# Patient Record
Sex: Female | Born: 1945 | Race: White | Hispanic: No | Marital: Married | Smoking: Never smoker
Health system: Southern US, Community
[De-identification: ages and names within clinical notes are randomized; demographics above are authoritative.]

## PROBLEM LIST (undated history)

## (undated) DIAGNOSIS — R011 Cardiac murmur, unspecified: Secondary | ICD-10-CM

## (undated) DIAGNOSIS — M1990 Unspecified osteoarthritis, unspecified site: Secondary | ICD-10-CM

## (undated) DIAGNOSIS — D649 Anemia, unspecified: Secondary | ICD-10-CM

## (undated) DIAGNOSIS — K219 Gastro-esophageal reflux disease without esophagitis: Secondary | ICD-10-CM

## (undated) DIAGNOSIS — H269 Unspecified cataract: Secondary | ICD-10-CM

## (undated) DIAGNOSIS — K5790 Diverticulosis of intestine, part unspecified, without perforation or abscess without bleeding: Secondary | ICD-10-CM

## (undated) DIAGNOSIS — T7840XA Allergy, unspecified, initial encounter: Secondary | ICD-10-CM

## (undated) DIAGNOSIS — I10 Essential (primary) hypertension: Secondary | ICD-10-CM

## (undated) DIAGNOSIS — E222 Syndrome of inappropriate secretion of antidiuretic hormone: Secondary | ICD-10-CM

## (undated) DIAGNOSIS — I499 Cardiac arrhythmia, unspecified: Secondary | ICD-10-CM

## (undated) DIAGNOSIS — N979 Female infertility, unspecified: Secondary | ICD-10-CM

## (undated) DIAGNOSIS — R7611 Nonspecific reaction to tuberculin skin test without active tuberculosis: Secondary | ICD-10-CM

## (undated) DIAGNOSIS — G629 Polyneuropathy, unspecified: Secondary | ICD-10-CM

## (undated) DIAGNOSIS — F419 Anxiety disorder, unspecified: Secondary | ICD-10-CM

## (undated) DIAGNOSIS — C801 Malignant (primary) neoplasm, unspecified: Secondary | ICD-10-CM

## (undated) DIAGNOSIS — F329 Major depressive disorder, single episode, unspecified: Secondary | ICD-10-CM

## (undated) DIAGNOSIS — F32A Depression, unspecified: Secondary | ICD-10-CM

## (undated) DIAGNOSIS — G709 Myoneural disorder, unspecified: Secondary | ICD-10-CM

## (undated) DIAGNOSIS — K5792 Diverticulitis of intestine, part unspecified, without perforation or abscess without bleeding: Secondary | ICD-10-CM

## (undated) HISTORY — DX: Polyneuropathy, unspecified: G62.9

## (undated) HISTORY — DX: Nonspecific reaction to tuberculin skin test without active tuberculosis: R76.11

## (undated) HISTORY — DX: Cardiac arrhythmia, unspecified: I49.9

## (undated) HISTORY — DX: Syndrome of inappropriate secretion of antidiuretic hormone: E22.2

## (undated) HISTORY — DX: Anemia, unspecified: D64.9

## (undated) HISTORY — PX: LUMBAR DISC SURGERY: SHX700

## (undated) HISTORY — PX: LOOP RECORDER IMPLANT: SHX5954

## (undated) HISTORY — DX: Diverticulosis of intestine, part unspecified, without perforation or abscess without bleeding: K57.90

## (undated) HISTORY — PX: COLONOSCOPY: SHX174

## (undated) HISTORY — PX: UPPER GASTROINTESTINAL ENDOSCOPY: SHX188

## (undated) HISTORY — DX: Myoneural disorder, unspecified: G70.9

## (undated) HISTORY — DX: Gastro-esophageal reflux disease without esophagitis: K21.9

## (undated) HISTORY — PX: CERVICAL BIOPSY  W/ LOOP ELECTRODE EXCISION: SUR135

## (undated) HISTORY — DX: Cardiac murmur, unspecified: R01.1

## (undated) HISTORY — PX: BREAST BIOPSY: SHX20

## (undated) HISTORY — DX: Allergy, unspecified, initial encounter: T78.40XA

## (undated) HISTORY — DX: Major depressive disorder, single episode, unspecified: F32.9

## (undated) HISTORY — PX: HYSTEROSCOPY: SHX211

## (undated) HISTORY — DX: Essential (primary) hypertension: I10

## (undated) HISTORY — PX: DILATION AND CURETTAGE OF UTERUS: SHX78

## (undated) HISTORY — DX: Diverticulitis of intestine, part unspecified, without perforation or abscess without bleeding: K57.92

## (undated) HISTORY — DX: Female infertility, unspecified: N97.9

## (undated) HISTORY — DX: Anxiety disorder, unspecified: F41.9

## (undated) HISTORY — PX: TONSILLECTOMY: SUR1361

## (undated) HISTORY — PX: COLPOSCOPY: SHX161

## (undated) HISTORY — PX: BREAST EXCISIONAL BIOPSY: SUR124

## (undated) HISTORY — DX: Unspecified osteoarthritis, unspecified site: M19.90

## (undated) HISTORY — DX: Depression, unspecified: F32.A

## (undated) HISTORY — PX: LUMBAR LAMINECTOMY: SHX95

---

## 2013-10-03 DIAGNOSIS — I10 Essential (primary) hypertension: Secondary | ICD-10-CM | POA: Diagnosis not present

## 2013-11-01 DIAGNOSIS — I491 Atrial premature depolarization: Secondary | ICD-10-CM | POA: Diagnosis not present

## 2013-11-01 DIAGNOSIS — R002 Palpitations: Secondary | ICD-10-CM | POA: Diagnosis not present

## 2013-11-01 DIAGNOSIS — I10 Essential (primary) hypertension: Secondary | ICD-10-CM | POA: Diagnosis not present

## 2013-11-29 DIAGNOSIS — I10 Essential (primary) hypertension: Secondary | ICD-10-CM | POA: Diagnosis not present

## 2013-12-26 DIAGNOSIS — M542 Cervicalgia: Secondary | ICD-10-CM | POA: Diagnosis not present

## 2013-12-26 DIAGNOSIS — I10 Essential (primary) hypertension: Secondary | ICD-10-CM | POA: Diagnosis not present

## 2013-12-28 DIAGNOSIS — F4322 Adjustment disorder with anxiety: Secondary | ICD-10-CM | POA: Diagnosis not present

## 2014-01-02 DIAGNOSIS — F4322 Adjustment disorder with anxiety: Secondary | ICD-10-CM | POA: Diagnosis not present

## 2014-01-09 DIAGNOSIS — F4322 Adjustment disorder with anxiety: Secondary | ICD-10-CM | POA: Diagnosis not present

## 2014-02-06 DIAGNOSIS — F4322 Adjustment disorder with anxiety: Secondary | ICD-10-CM | POA: Diagnosis not present

## 2014-02-09 DIAGNOSIS — M549 Dorsalgia, unspecified: Secondary | ICD-10-CM | POA: Diagnosis not present

## 2014-02-09 DIAGNOSIS — E785 Hyperlipidemia, unspecified: Secondary | ICD-10-CM | POA: Diagnosis not present

## 2014-02-23 DIAGNOSIS — F4322 Adjustment disorder with anxiety: Secondary | ICD-10-CM | POA: Diagnosis not present

## 2014-03-07 DIAGNOSIS — H25099 Other age-related incipient cataract, unspecified eye: Secondary | ICD-10-CM | POA: Diagnosis not present

## 2014-03-07 DIAGNOSIS — H40019 Open angle with borderline findings, low risk, unspecified eye: Secondary | ICD-10-CM | POA: Diagnosis not present

## 2014-03-07 DIAGNOSIS — H43819 Vitreous degeneration, unspecified eye: Secondary | ICD-10-CM | POA: Diagnosis not present

## 2014-03-09 DIAGNOSIS — E871 Hypo-osmolality and hyponatremia: Secondary | ICD-10-CM | POA: Diagnosis not present

## 2014-03-09 DIAGNOSIS — E785 Hyperlipidemia, unspecified: Secondary | ICD-10-CM | POA: Diagnosis not present

## 2014-03-09 DIAGNOSIS — I10 Essential (primary) hypertension: Secondary | ICD-10-CM | POA: Diagnosis not present

## 2014-04-24 DIAGNOSIS — I10 Essential (primary) hypertension: Secondary | ICD-10-CM | POA: Diagnosis not present

## 2014-07-11 DIAGNOSIS — I10 Essential (primary) hypertension: Secondary | ICD-10-CM | POA: Diagnosis not present

## 2014-07-14 DIAGNOSIS — E785 Hyperlipidemia, unspecified: Secondary | ICD-10-CM | POA: Diagnosis not present

## 2014-07-14 DIAGNOSIS — I10 Essential (primary) hypertension: Secondary | ICD-10-CM | POA: Diagnosis not present

## 2014-07-14 DIAGNOSIS — M810 Age-related osteoporosis without current pathological fracture: Secondary | ICD-10-CM | POA: Diagnosis not present

## 2014-10-04 DIAGNOSIS — F329 Major depressive disorder, single episode, unspecified: Secondary | ICD-10-CM | POA: Diagnosis not present

## 2014-10-04 DIAGNOSIS — E222 Syndrome of inappropriate secretion of antidiuretic hormone: Secondary | ICD-10-CM | POA: Diagnosis not present

## 2014-10-04 DIAGNOSIS — Z23 Encounter for immunization: Secondary | ICD-10-CM | POA: Diagnosis not present

## 2014-11-01 DIAGNOSIS — E222 Syndrome of inappropriate secretion of antidiuretic hormone: Secondary | ICD-10-CM | POA: Diagnosis not present

## 2014-12-04 ENCOUNTER — Ambulatory Visit: Payer: Medicare Other | Admitting: Podiatry

## 2014-12-18 ENCOUNTER — Ambulatory Visit (INDEPENDENT_AMBULATORY_CARE_PROVIDER_SITE_OTHER): Payer: Medicare Other

## 2014-12-18 ENCOUNTER — Ambulatory Visit (INDEPENDENT_AMBULATORY_CARE_PROVIDER_SITE_OTHER): Payer: Medicare Other | Admitting: Podiatry

## 2014-12-18 ENCOUNTER — Encounter: Payer: Self-pay | Admitting: Podiatry

## 2014-12-18 VITALS — BP 134/78 | HR 72 | Resp 12

## 2014-12-18 DIAGNOSIS — R52 Pain, unspecified: Secondary | ICD-10-CM

## 2014-12-18 DIAGNOSIS — S92301A Fracture of unspecified metatarsal bone(s), right foot, initial encounter for closed fracture: Secondary | ICD-10-CM | POA: Diagnosis not present

## 2014-12-18 NOTE — Progress Notes (Signed)
   Subjective:    Patient ID: Katrina Benitez, female    DOB: 15-May-1946, 69 y.o.   MRN: [id number]  HPI N-SORE L-RT FOOT TOP OF THE FOOT D-4 MONTHS O-SLOWLY C-BETTER A-PRESSURE, SHOES T-HEAT, ELEVATION.  Patient as a history of dropping a laptop computer in the right foot approximately December 2015. She still is complaining of some diffuse pain on the dorsal right midfoot area that has persisted since the drop on injury. In addition she has reduced her walking program because of persistent right foot pain  ALSO, B/L BALL OF THE FOOT HAVE CALLUS.  Review of Systems  HENT: Positive for sinus pressure.   Gastrointestinal: Positive for abdominal pain and constipation.  Musculoskeletal: Positive for back pain.  Neurological: Positive for numbness.  All other systems reviewed and are negative.  Patient is a retired Stage manager    Objective:   Physical Exam  Orientated 3  Vascular: DP pulses 2/4 bilaterally PT pulses 2/4 bilaterally Capillary reflex immediate bilaterally  Neurological: Ankle reflex equal and reactive bilaterally Vibratory sensation intact bilaterally  Sensation to 10 g monofilament wire intact 4/5 right and 5/5 left  Dermatological: Texture and turgor. Within normal limits No evidence of callus formation noted bilaterally  Musculoskeletal: There is no restriction ankle, subtalar, midtarsal joints bilaterally Palpable tenderness dorsal midfoot right without any obvious palpable lesions  X-ray examination weightbearing right foot  Metatarsus adductus Bone density appears to be adequate Oblique fracture distal fifth metatarsal without bone callus. There is minimal displacement  Radiographic impression: Fractured fifth metatarsal, delayed union right foot     Assessment & Plan:   Assessment: Fracture fifth right metatarsal, delayed union  Plan: I discussed findings of patient's examination an x-ray today. I made patient aware that she does have a  fracture without any evidence of bony union at this time.  I placed patient in a surgical shoe with instructions to wear at all times with the exception of sleeping and showering Advised patient to DC walking for exercise and substitute exercise bike or rowing machine  Reevaluate and re-x-ray 6 weeks

## 2014-12-18 NOTE — Patient Instructions (Signed)
Wear surgical shoe on a continuousexcept when showering and sleeping Limit standing and walking Okay to do exercise bike or rowing machine

## 2014-12-26 DIAGNOSIS — L309 Dermatitis, unspecified: Secondary | ICD-10-CM | POA: Diagnosis not present

## 2014-12-26 DIAGNOSIS — D239 Other benign neoplasm of skin, unspecified: Secondary | ICD-10-CM | POA: Diagnosis not present

## 2015-01-22 ENCOUNTER — Ambulatory Visit: Payer: Medicare Other | Admitting: Podiatry

## 2015-01-31 DIAGNOSIS — R102 Pelvic and perineal pain: Secondary | ICD-10-CM | POA: Diagnosis not present

## 2015-01-31 DIAGNOSIS — R938 Abnormal findings on diagnostic imaging of other specified body structures: Secondary | ICD-10-CM | POA: Diagnosis not present

## 2015-03-06 DIAGNOSIS — H43812 Vitreous degeneration, left eye: Secondary | ICD-10-CM | POA: Diagnosis not present

## 2015-03-12 DIAGNOSIS — Z1231 Encounter for screening mammogram for malignant neoplasm of breast: Secondary | ICD-10-CM | POA: Diagnosis not present

## 2015-03-12 DIAGNOSIS — R0989 Other specified symptoms and signs involving the circulatory and respiratory systems: Secondary | ICD-10-CM | POA: Diagnosis not present

## 2015-03-12 DIAGNOSIS — E222 Syndrome of inappropriate secretion of antidiuretic hormone: Secondary | ICD-10-CM | POA: Diagnosis not present

## 2015-03-12 DIAGNOSIS — G4700 Insomnia, unspecified: Secondary | ICD-10-CM | POA: Diagnosis not present

## 2015-03-12 DIAGNOSIS — R42 Dizziness and giddiness: Secondary | ICD-10-CM | POA: Diagnosis not present

## 2015-03-20 ENCOUNTER — Other Ambulatory Visit: Payer: Self-pay | Admitting: Family

## 2015-03-20 DIAGNOSIS — R0989 Other specified symptoms and signs involving the circulatory and respiratory systems: Secondary | ICD-10-CM

## 2015-03-20 DIAGNOSIS — Z1231 Encounter for screening mammogram for malignant neoplasm of breast: Secondary | ICD-10-CM

## 2015-06-05 ENCOUNTER — Ambulatory Visit: Payer: Self-pay

## 2015-06-06 ENCOUNTER — Ambulatory Visit
Admission: RE | Admit: 2015-06-06 | Discharge: 2015-06-06 | Disposition: A | Discharge status: Home / Self Care | Payer: Medicare Other | Source: Ambulatory Visit | Attending: Family | Admitting: Family

## 2015-06-06 DIAGNOSIS — Z1231 Encounter for screening mammogram for malignant neoplasm of breast: Secondary | ICD-10-CM | POA: Diagnosis not present

## 2015-06-06 DIAGNOSIS — I10 Essential (primary) hypertension: Secondary | ICD-10-CM | POA: Diagnosis not present

## 2015-06-06 DIAGNOSIS — R0989 Other specified symptoms and signs involving the circulatory and respiratory systems: Secondary | ICD-10-CM

## 2015-07-06 DIAGNOSIS — E222 Syndrome of inappropriate secretion of antidiuretic hormone: Secondary | ICD-10-CM | POA: Diagnosis not present

## 2015-07-06 DIAGNOSIS — Z23 Encounter for immunization: Secondary | ICD-10-CM | POA: Diagnosis not present

## 2015-07-06 DIAGNOSIS — F419 Anxiety disorder, unspecified: Secondary | ICD-10-CM | POA: Diagnosis not present

## 2015-08-28 DIAGNOSIS — H43812 Vitreous degeneration, left eye: Secondary | ICD-10-CM | POA: Diagnosis not present

## 2015-09-11 DIAGNOSIS — H2513 Age-related nuclear cataract, bilateral: Secondary | ICD-10-CM | POA: Diagnosis not present

## 2015-10-02 ENCOUNTER — Telehealth: Payer: Self-pay

## 2015-10-02 DIAGNOSIS — K219 Gastro-esophageal reflux disease without esophagitis: Secondary | ICD-10-CM | POA: Diagnosis not present

## 2015-10-02 DIAGNOSIS — E222 Syndrome of inappropriate secretion of antidiuretic hormone: Secondary | ICD-10-CM | POA: Diagnosis not present

## 2015-10-02 DIAGNOSIS — F325 Major depressive disorder, single episode, in full remission: Secondary | ICD-10-CM | POA: Diagnosis not present

## 2015-10-02 DIAGNOSIS — R109 Unspecified abdominal pain: Secondary | ICD-10-CM | POA: Diagnosis not present

## 2015-10-02 DIAGNOSIS — Z1389 Encounter for screening for other disorder: Secondary | ICD-10-CM | POA: Diagnosis not present

## 2015-10-02 DIAGNOSIS — Z6826 Body mass index (BMI) 26.0-26.9, adult: Secondary | ICD-10-CM | POA: Diagnosis not present

## 2015-10-02 DIAGNOSIS — I10 Essential (primary) hypertension: Secondary | ICD-10-CM | POA: Diagnosis not present

## 2015-10-02 NOTE — Telephone Encounter (Signed)
Irene Shipper, MD  Algernon Huxley, RN           I'm more than happy to see her. Please let Dr. Glennon Mac know and thank him for the referral. Let him know that we will contact the patient and set up an OV. Please contact the patient. Thanks [employer].       Previous Messages     ----- Message -----   From: Algernon Huxley, RN   Sent: 09/25/2015  4:04 PM    To: Irene Shipper, MD  Subject: Referral                     Dr. Henrene Pastor,   Dr. Luberta Robertson called and wanted to refer a pt to you. 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 He states she has had some upper gi issues and wanted to know which GI to see and he suggested you.   [place]  [place] [address]  [phone]   Dr. Glennon Mac said to call him with any questions [phone]   He did not have her DOB and I could not find her in epic. Please let me know if you want to see her.   Thanks,  Linda             Pt scheduled to see Dr. Henrene Pastor tomorrow at 8:45am. Pt aware of appt.

## 2015-10-03 ENCOUNTER — Other Ambulatory Visit (INDEPENDENT_AMBULATORY_CARE_PROVIDER_SITE_OTHER): Payer: Medicare Other

## 2015-10-03 ENCOUNTER — Ambulatory Visit (INDEPENDENT_AMBULATORY_CARE_PROVIDER_SITE_OTHER): Payer: Medicare Other | Admitting: Internal Medicine

## 2015-10-03 ENCOUNTER — Encounter: Payer: Self-pay | Admitting: Internal Medicine

## 2015-10-03 VITALS — BP 140/80 | HR 76 | Ht 65.5 in | Wt 163.5 lb

## 2015-10-03 DIAGNOSIS — R140 Abdominal distension (gaseous): Secondary | ICD-10-CM

## 2015-10-03 DIAGNOSIS — R1011 Right upper quadrant pain: Secondary | ICD-10-CM

## 2015-10-03 DIAGNOSIS — R1310 Dysphagia, unspecified: Secondary | ICD-10-CM

## 2015-10-03 DIAGNOSIS — K219 Gastro-esophageal reflux disease without esophagitis: Secondary | ICD-10-CM

## 2015-10-03 LAB — IGA: IgA: 135 mg/dL (ref 68–378)

## 2015-10-03 NOTE — Progress Notes (Signed)
HISTORY OF PRESENT ILLNESS:  Katrina Benitez is a 70 y.o. female , retired Surveyor, quantity, who is referred today by Dr. Luberta Robertson regarding a myriad of GI complaints. Patient reports chronic GI issues. No outside records available for review. Chief complaints include worsening reflux disease with dysphagia, chronic abdominal bloating, and isolated episode of severe right upper quadrant abdominal pain. She does report having had prior GI evaluations including upper endoscopy 8 or 10 years ago and colonoscopies in 2005 and 2014. Upper endoscopy was apparently unrevealing. Colonoscopies with diverticulosis only. She has had problems with chronic GERD intermittently. She describes pyrosis and water brash. As well but sounds like intermittent solid food dysphagia with transient impaction. Approximate 1 month ago she began Nexium OTC 40 mg daily. She's not convinced that this has helped. Next, she reports long-standing problems with bloating and gas. Her bowels tend to be mildly constipated since back surgery. This is easily regulated with Colace. She recently started a probiotic. Finally, she reports having had an episode of severe focal right upper quadrant pain that woke her. This occurred approximate 3 months ago. The discomfort lasted approximately 2 hours. No additional associated symptoms. No previous problem with similar pain or recurrence since. GI review of systems is remarkable for chronic intermittent nausea and estimated 10 pound weight gain  REVIEW OF SYSTEMS:  All non-GI ROS negative except for anxiety, fatigue, heart murmur, night sweats  Past Medical History  Diagnosis Date  . HTN (hypertension)   . Diverticulosis   . Diverticulitis   . SIADH (syndrome of inappropriate ADH production) ([place])   . Anxiety   . GERD (gastroesophageal reflux disease)   . Cardiac arrhythmia     Past Surgical History  Procedure Laterality Date  . Lumbar disc surgery    . Lumbar laminectomy    .  Tonsillectomy    . Breast biopsy Left     Social History Majesty Stehlin  reports that she has never smoked. She has never used smokeless tobacco. She reports that she drinks alcohol. She reports that she does not use illicit drugs.  family history includes Breast cancer in her mother; Glaucoma in her mother; Prostate cancer in her paternal grandfather; Tuberculosis in her paternal grandmother.  Allergies  Allergen Reactions  . Ciprofloxacin   . Dilaudid [Hydromorphone Hcl]   . Flagyl [Metronidazole]   . Visipaque [Iodixanol]        PHYSICAL EXAMINATION: Vital signs: BP 140/80 mmHg  Pulse 76  Ht 5' 5.5" (1.664 m)  Wt 163 lb 8 oz (74.163 kg)  BMI 26.78 kg/m2  Constitutional: generally well-appearing, no acute distress Psychiatric: alert and oriented x3, cooperative Eyes: extraocular movements intact, anicteric, conjunctiva pink Mouth: oral pharynx moist, no lesions Neck: supple without thyromegaly Lymph: no lymphadenopathy Cardiovascular: heart regular rate and rhythm Lungs: clear to auscultation bilaterally Abdomen: soft, nontender, nondistended, no obvious ascites, no peritoneal signs, normal bowel sounds, no organomegaly Rectal: Omitted Extremities: no clubbing cyanosis or lower extremity edema bilaterally Skin: no lesions on visible extremities Neuro: No focal deficits. Normal deep tendon reflexes. No asterixis.   ASSESSMENT:  #1. Chronic GERD. Recent exacerbation. Symptoms despite PPI, by report #2. Intermittent solid food dysphagia. Rule out peptic stricture #3. Chronic abdominal bloating and gas. No alarm features. Rule out organic causes #4. Severe episode of right upper quadrant pain as described. Rule out gallstones #5. Colon cancer screening with multiple prior colonoscopies. Most recent 2005 and 2014 by report. Denies polyps  PLAN:  #1. Reflux  precautions #2. Literature on GERD provided for her review with reflux precautions as well #3. Continue Nexium 40  mg daily. Advised to take 30 minutes before first meal in a.m. #4. Obtain tissue transglutaminase antibody and serum IgA level to screen for celiac disease as a cause for chronic bloating #5. Continue probiotic to see if this helps with bloating #6. Literature on increased intestinal gas provided for her review #7. Anti-gas and flatulence dietary sheet provided for her review #8. Scheduled almond ultrasound to rule out gallstones and evaluate severe right upper quadrant pain #9. Schedule upper endoscopy with probable esophageal dilation for refractory GERD and dysphagia.The nature of the procedure, as well as the risks, benefits, and alternatives were carefully and thoroughly reviewed with the patient. Ample time for discussion and questions allowed. The patient understood, was satisfied, and agreed to proceed. #10. Obtain outside records were reviewed. Ask patient to secure

## 2015-10-03 NOTE — Patient Instructions (Signed)
You have been scheduled for an abdominal ultrasound at [facility] (1st floor of hospital) on 10/10/2015 at 9:00am. Please arrive 15 minutes prior to your appointment for registration. Make certain not to have anything to eat or drink 6 hours prior to your appointment. Should you need to reschedule your appointment, please contact radiology at [phone]. This test typically takes about 30 minutes to perform.  Your physician has requested that you go to the basement for the following lab work before leaving today:  TTG, IGA  You have been scheduled for an endoscopy. Please follow written instructions given to you at your visit today. If you use inhalers (even only as needed), please bring them with you on the day of your procedure.

## 2015-10-04 ENCOUNTER — Ambulatory Visit (AMBULATORY_SURGERY_CENTER): Payer: Medicare Other | Admitting: Internal Medicine

## 2015-10-04 ENCOUNTER — Encounter: Payer: Self-pay | Admitting: Internal Medicine

## 2015-10-04 VITALS — BP 129/80 | HR 59 | Temp 96.9°F | Resp 14 | Ht 65.5 in | Wt 163.0 lb

## 2015-10-04 DIAGNOSIS — K222 Esophageal obstruction: Secondary | ICD-10-CM

## 2015-10-04 DIAGNOSIS — R1011 Right upper quadrant pain: Secondary | ICD-10-CM | POA: Diagnosis not present

## 2015-10-04 DIAGNOSIS — R1310 Dysphagia, unspecified: Secondary | ICD-10-CM

## 2015-10-04 DIAGNOSIS — I10 Essential (primary) hypertension: Secondary | ICD-10-CM | POA: Diagnosis not present

## 2015-10-04 DIAGNOSIS — K219 Gastro-esophageal reflux disease without esophagitis: Secondary | ICD-10-CM | POA: Diagnosis not present

## 2015-10-04 LAB — TISSUE TRANSGLUTAMINASE, IGA: TISSUE TRANSGLUTAMINASE AB, IGA: 1 U/mL (ref <4)

## 2015-10-04 MED ORDER — SODIUM CHLORIDE 0.9 % IV SOLN: 500.0000 mL | INTRAVENOUS | Status: DC

## 2015-10-04 NOTE — Progress Notes (Signed)
Patient awakening,vss,report to rn 

## 2015-10-04 NOTE — Patient Instructions (Signed)
YOU HAD AN ENDOSCOPIC PROCEDURE TODAY AT [facility]:   Refer to the procedure report that was given to you for any specific questions about what was found during the examination.  If the procedure report does not answer your questions, please call your gastroenterologist to clarify.  If you requested that your care partner not be given the details of your procedure findings, then the procedure report has been included in a sealed envelope for you to review at your convenience later.  YOU SHOULD EXPECT: Some feelings of bloating in the abdomen. Passage of more gas than usual.  Walking can help get rid of the air that was put into your GI tract during the procedure and reduce the bloating. If you had a lower endoscopy (such as a colonoscopy or flexible sigmoidoscopy) you may notice spotting of blood in your stool or on the toilet paper. If you underwent a bowel prep for your procedure, you may not have a normal bowel movement for a few days.  Please Note:  You might notice some irritation and congestion in your nose or some drainage.  This is from the oxygen used during your procedure.  There is no need for concern and it should clear up in a day or so.  SYMPTOMS TO REPORT IMMEDIATELY:    Following upper endoscopy (EGD)  Vomiting of blood or coffee ground material  New chest pain or pain under the shoulder blades  Painful or persistently difficult swallowing  New shortness of breath  Fever of 100F or higher  Black, tarry-looking stools  For urgent or emergent issues, a gastroenterologist can be reached at any hour by calling [phone].   DIET:  Follow dilation hand out..  ACTIVITY:  You should plan to take it easy for the rest of today and you should NOT DRIVE or use heavy machinery until tomorrow (because of the sedation medicines used during the test).    FOLLOW UP: Our staff will call the number listed on your records the next business day following your procedure  to check on you and address any questions or concerns that you may have regarding the information given to you following your procedure. If we do not reach you, we will leave a message.  However, if you are feeling well and you are not experiencing any problems, there is no need to return our call.  We will assume that you have returned to your regular daily activities without incident.  If any biopsies were taken you will be contacted by phone or by letter within the next 1-3 weeks.  Please call us at [phone] if you have not heard about the biopsies in 3 weeks.    SIGNATURES/CONFIDENTIALITY: You and/or your care partner have signed paperwork which will be entered into your electronic medical record.  These signatures attest to the fact that that the information above on your After Visit Summary has been reviewed and is understood.  Full responsibility of the confidentiality of this discharge information lies with you and/or your care-partner.   Resume medication Information given on dilation diet.

## 2015-10-04 NOTE — Progress Notes (Signed)
Called to room to assist during endoscopic procedure.  Patient ID and intended procedure confirmed with present staff. Received instructions for my participation in the procedure from the performing physician.  

## 2015-10-04 NOTE — Op Note (Signed)
[place]  [address] [place], [id number]   ENDOSCOPY PROCEDURE REPORT  PATIENT: Katrina Benitez, Katrina Benitez  MR#: [id number] BIRTHDATE: 24-Feb-1946 , 67  yrs. old GENDER: female ENDOSCOPIST: Eustace Quail, MD REFERRED BY:  .  Self / Office PROCEDURE DATE:  10/04/2015 PROCEDURE:  EGD, diagnostic and Maloney dilation of esophagus   -19 French ASA CLASS:     Class II INDICATIONS:  history of esophageal reflux, dysphagia, and abdominal pain. MEDICATIONS: Monitored anesthesia care and Propofol 150 mg IV TOPICAL ANESTHETIC: none  DESCRIPTION OF PROCEDURE: After the risks benefits and alternatives of the procedure were thoroughly explained, informed consent was obtained.  The LB [id number] endoscope was introduced through the mouth and advanced to the second portion of the duodenum , Without limitations.  The instrument was slowly withdrawn as the mucosa was fully examined.  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  The stomach was normal.  The duodenum was normal.  Retroflexed views revealed a hiatal hernia.     The scope was then withdrawn from the patient and the procedure completed. THERAPY: 54 French Maloney dilator was passed into the esophagus without resistance or heme. Tolerated well  COMPLICATIONS: There were no immediate complications.  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  The stomach was normal.  The duodenum was normal  RECOMMENDATIONS: 1.  Clear liquids until 5 PM, then soft foods rest of day.  Resume prior diet tomorrow. 2.  Continue Nexium 40 mg daily. Take on empty stomach 30 minutes before breakfast 3.  Continue recently started probiotic 4.  Keep scheduled appointment for abdominal ultrasound 4.  Call office next 2-3 days to schedule an office appointment  with Dr. Henrene Pastor, for follow-up, in about 6 weeks  REPEAT EXAM:  eSigned:  Eustace Quail, MD 10/04/2015 3:01 PM    CC:The Patient and Eloise Levels, MD

## 2015-10-05 ENCOUNTER — Telehealth: Payer: Self-pay

## 2015-10-05 NOTE — Telephone Encounter (Signed)
No answer, left voicemail

## 2015-10-08 ENCOUNTER — Telehealth: Payer: Self-pay | Admitting: Internal Medicine

## 2015-10-08 NOTE — Telephone Encounter (Signed)
Left message for pt to call back.  Pt called to let us know she has rescheduled her [place] to [place] @12 :40pm.

## 2015-10-09 DIAGNOSIS — Z01411 Encounter for gynecological examination (general) (routine) with abnormal findings: Secondary | ICD-10-CM | POA: Diagnosis not present

## 2015-10-09 DIAGNOSIS — N7689 Other specified inflammation of vagina and vulva: Secondary | ICD-10-CM | POA: Diagnosis not present

## 2015-10-09 DIAGNOSIS — R938 Abnormal findings on diagnostic imaging of other specified body structures: Secondary | ICD-10-CM | POA: Diagnosis not present

## 2015-10-10 ENCOUNTER — Ambulatory Visit (HOSPITAL_COMMUNITY): Payer: Medicare Other

## 2015-10-12 ENCOUNTER — Other Ambulatory Visit: Payer: Medicare Other

## 2015-10-17 ENCOUNTER — Ambulatory Visit
Admission: RE | Admit: 2015-10-17 | Discharge: 2015-10-17 | Disposition: A | Discharge status: Home / Self Care | Payer: Medicare Other | Source: Ambulatory Visit | Attending: Internal Medicine | Admitting: Internal Medicine

## 2015-10-17 DIAGNOSIS — K219 Gastro-esophageal reflux disease without esophagitis: Secondary | ICD-10-CM

## 2015-10-17 DIAGNOSIS — R1011 Right upper quadrant pain: Secondary | ICD-10-CM

## 2015-10-17 DIAGNOSIS — R140 Abdominal distension (gaseous): Secondary | ICD-10-CM

## 2015-11-15 ENCOUNTER — Ambulatory Visit: Payer: Medicare Other | Admitting: Internal Medicine

## 2015-11-26 DIAGNOSIS — Z136 Encounter for screening for cardiovascular disorders: Secondary | ICD-10-CM | POA: Diagnosis not present

## 2015-11-26 DIAGNOSIS — E222 Syndrome of inappropriate secretion of antidiuretic hormone: Secondary | ICD-10-CM | POA: Diagnosis not present

## 2015-11-26 DIAGNOSIS — E2839 Other primary ovarian failure: Secondary | ICD-10-CM | POA: Diagnosis not present

## 2015-11-26 DIAGNOSIS — L821 Other seborrheic keratosis: Secondary | ICD-10-CM | POA: Diagnosis not present

## 2015-11-26 DIAGNOSIS — R296 Repeated falls: Secondary | ICD-10-CM | POA: Diagnosis not present

## 2015-12-12 ENCOUNTER — Encounter: Payer: Self-pay | Admitting: Neurology

## 2015-12-12 ENCOUNTER — Ambulatory Visit (INDEPENDENT_AMBULATORY_CARE_PROVIDER_SITE_OTHER): Payer: Medicare Other | Admitting: Neurology

## 2015-12-12 VITALS — BP 176/96 | HR 72 | Ht 65.5 in | Wt 171.0 lb

## 2015-12-12 DIAGNOSIS — E538 Deficiency of other specified B group vitamins: Secondary | ICD-10-CM

## 2015-12-12 DIAGNOSIS — R269 Unspecified abnormalities of gait and mobility: Secondary | ICD-10-CM | POA: Diagnosis not present

## 2015-12-12 NOTE — Progress Notes (Signed)
Reason for visit: Gait disorder  Referring physician: Dr. Levy Sjogren Katrina Benitez is a 70 y.o. female  History of present illness:  Katrina Benitez is a 70 year old right-handed white female with a history of various issues over the last 20 or 30 years. The patient is concerned that she may have multiple sclerosis. She indicates that she has had difficulty with swallowing liquids, she may choke frequently, she has had this issue for about 20 years. The patient has been seen in the past by an oral surgeon, the etiology has not been delineated. The patient has been choking about once a week on average recently. The patient has a lot of sensations of fatigue, she also has some issues with gait instability, she may stumble frequently. The patient has some numbness in the hands and feet, she has been diagnosed with a peripheral neuropathy by nerve conduction studies done within the last couple years. This workup was done in [place], [place]. The patient denies any issues controlling the bowels or the bladder, she does have some hesitancy with initiation of urination, however. The patient denies any weakness of the extremities. She does report some mild cognitive changes. 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 She is concerned that she has a disease such as multiple sclerosis, and she comes to the office today for an evaluation. She is a retired Stage manager.  Past Medical History  Diagnosis Date  . HTN (hypertension)   . Diverticulosis   . Diverticulitis   . SIADH (syndrome of inappropriate ADH production) ([place])   . Anxiety   . GERD (gastroesophageal reflux disease)   . Cardiac arrhythmia   . Allergy   . Anemia   . Arthritis     djd  . Heart murmur   . Neuromuscular disorder ([place])     idiopathic peripheral  neuropathy  . Peripheral neuropathy ([place])   . Depression     Past Surgical History  Procedure  Laterality Date  . Lumbar disc surgery    . Lumbar laminectomy    . Tonsillectomy    . Breast biopsy Left   . Colonoscopy    . Upper gastrointestinal endoscopy      Family History  Problem Relation Age of Onset  . Breast cancer Mother   . Glaucoma Mother   . Tuberculosis Paternal Grandmother   . Prostate cancer Paternal Grandfather   . Colon cancer Neg Hx   . Esophageal cancer Neg Hx   . Rectal cancer Neg Hx   . Stomach cancer Neg Hx   . Heart attack Father     Social history:  reports that she has never smoked. She has never used smokeless tobacco. She reports that she drinks about 4.2 oz of alcohol per week. She reports that she does not use illicit drugs.  Medications:  Prior to Admission medications   Medication Sig Start Date End Date Taking? Authorizing Provider  aspirin 325 MG tablet Take 325 mg by mouth daily.    Historical Provider, MD  cholecalciferol (VITAMIN D-400) 400 units TABS tablet Take 400 Units by mouth daily.    Historical Provider, MD  diazepam (VALIUM) 10 MG tablet Take 10 mg by mouth as needed for anxiety.    Historical Provider, MD  diphenoxylate-atropine (LOMOTIL) 2.5-0.025 MG tablet Take by mouth as needed for diarrhea or loose stools. Reported on 10/04/2015    Historical Provider, MD  docusate  sodium (COLACE) 100 MG capsule Take 300 mg by mouth daily.    Historical Provider, MD  DULoxetine (CYMBALTA) 20 MG capsule Take 40 mg by mouth daily.    Historical Provider, MD  ibuprofen (ADVIL) 200 MG tablet Take 200 mg by mouth as needed. Reported on 10/04/2015    Historical Provider, MD  metoprolol succinate (TOPROL-XL) 25 MG 24 hr tablet Take 25 mg by mouth daily.    Historical Provider, MD  ondansetron (ZOFRAN) 4 MG tablet Take 4 mg by mouth as needed for nausea or vomiting. Reported on 10/04/2015    Historical Provider, MD  sodium chloride 1 g tablet Take 1 g by mouth 2 (two) times daily with a meal.    Historical Provider, MD  zolpidem (AMBIEN) 10 MG tablet  Take 10 mg by mouth at bedtime as needed for sleep.    Historical Provider, MD      Allergies  Allergen Reactions  . Ciprofloxacin Hives  . Dilaudid [Hydromorphone Hcl] Hives  . Flagyl [Metronidazole] Hives  . Visipaque [Iodixanol] Swelling    Throat swelling    ROS:  Out of a complete 14 system review of symptoms, the patient complains only of the following symptoms, and all other reviewed systems are negative.  Weight gain, fatigue Chest pain, murmur, swelling in the legs Difficulty swallowing Itching Blurred vision Urination problems Anemia, easy bruising Muscle cramps, aching muscles Allergies, runny nose Memory loss, numbness, difficulty swallowing Depression, anxiety, decreased energy Insomnia  Blood pressure 176/96, pulse 72, height 5' 5.5" (1.664 m), weight 171 lb (77.565 kg).  Physical Exam  General: The patient is alert and cooperative at the time of the examination.  Eyes: Pupils are equal, round, and reactive to light. Discs are flat bilaterally.  Neck: The neck is supple, no carotid bruits are noted.  Respiratory: The respiratory examination is clear.  Cardiovascular: The cardiovascular examination reveals a regular rate and rhythm, no obvious murmurs or rubs are noted.  Skin: Extremities are without significant edema.  Neurologic Exam  Mental status: The patient is alert and oriented x 3 at the time of the examination. The patient has apparent normal recent and remote memory, with an apparently normal attention span and concentration ability.  Cranial nerves: Facial symmetry is present. There is good sensation of the face to pinprick and soft touch bilaterally. The strength of the facial muscles and the muscles to head turning and shoulder shrug are normal bilaterally. Speech is well enunciated, no aphasia or dysarthria is noted. Extraocular movements are full. Visual fields are full. The tongue is midline, and the patient has symmetric elevation of the  soft palate. No obvious hearing deficits are noted.  Motor: The motor testing reveals 5 over 5 strength of all 4 extremities. Good symmetric motor tone is noted throughout.  Sensory: Sensory testing is intact to pinprick, soft touch, vibration sensation, and position sense on all 4 extremities, with exception of a stocking pattern pinprick sensory deficit one half way up the legs, mild impairment of position sense in both feet. No evidence of extinction is noted.  Coordination: Cerebellar testing reveals good finger-nose-finger and heel-to-shin bilaterally.  Gait and station: Gait is normal. Tandem gait is normal. Romberg is negative. No drift is seen.  Reflexes: Deep tendon reflexes are symmetric and normal bilaterally. Toes are downgoing bilaterally.   Assessment/Plan:  1. Mild gait disorder  2. Peripheral neuropathy  The patient has a multitude of complaints including fatigue, difficulty swallowing, gait disturbance, and some mild memory issues.  The clinical examination today was relatively unremarkable. She is concerned that she has overlying issues such as multiple sclerosis. The patient has had some persistent symptoms for greater than 20 years. She will be set up for MRI of the brain, and some blood work will be done today. She has a history of SIADH, she claims that her sodium levels have gone down to the 120 range. She will follow-up through this office if needed.  Jill Alexanders MD 12/12/2015 7:47 PM  [facility] [address] [place], [place] [address]  Phone [phone] Fax [phone]

## 2015-12-13 LAB — VITAMIN B12: Vitamin B-12: 402 pg/mL (ref 211–946)

## 2015-12-13 LAB — COMPREHENSIVE METABOLIC PANEL
ALBUMIN: 4.4 g/dL (ref 3.6–4.8)
ALK PHOS: 131 IU/L — AB (ref 39–117)
ALT: 17 IU/L (ref 0–32)
AST: 18 IU/L (ref 0–40)
Albumin/Globulin Ratio: 2 (ref 1.2–2.2)
BUN / CREAT RATIO: 25 (ref 11–26)
BUN: 17 mg/dL (ref 8–27)
Bilirubin Total: 0.3 mg/dL (ref 0.0–1.2)
CHLORIDE: 97 mmol/L (ref 96–106)
CO2: 23 mmol/L (ref 18–29)
CREATININE: 0.69 mg/dL (ref 0.57–1.00)
Calcium: 9.3 mg/dL (ref 8.7–10.3)
GFR calc Af Amer: 103 mL/min/{1.73_m2} (ref >59)
GFR calc non Af Amer: 89 mL/min/{1.73_m2} (ref >59)
GLUCOSE: 95 mg/dL (ref 65–99)
Globulin, Total: 2.2 g/dL (ref 1.5–4.5)
Potassium: 4.5 mmol/L (ref 3.5–5.2)
Sodium: 136 mmol/L (ref 134–144)
Total Protein: 6.6 g/dL (ref 6.0–8.5)

## 2015-12-13 LAB — B. BURGDORFI ANTIBODIES

## 2015-12-13 LAB — RPR: RPR Ser Ql: NONREACTIVE

## 2015-12-13 LAB — ANGIOTENSIN CONVERTING ENZYME: ANGIO CONVERT ENZYME: 24 U/L (ref 14–82)

## 2015-12-13 LAB — ANA W/REFLEX: Anti Nuclear Antibody(ANA): NEGATIVE

## 2015-12-13 LAB — SEDIMENTATION RATE: Sed Rate: 5 mm/hr (ref 0–40)

## 2015-12-14 ENCOUNTER — Encounter: Payer: Self-pay | Admitting: Neurology

## 2015-12-14 ENCOUNTER — Telehealth: Payer: Self-pay | Admitting: *Deleted

## 2015-12-14 NOTE — Telephone Encounter (Signed)
Spoke to pt and gave results of labs.  She verbalized understanding.

## 2015-12-14 NOTE — Telephone Encounter (Signed)
-----   Message from Kathrynn Ducking, MD sent at 12/13/2015  5:08 PM EDT -----  The blood work results are unremarkable, with exception of a minimal elevation in alkaline phosphatase. No significant concerns. Please call the patient. ----- Message -----    From: [facility] Lab Results In Interface    Sent: 12/13/2015   7:43 AM      To: Kathrynn Ducking, MD

## 2015-12-21 ENCOUNTER — Ambulatory Visit
Admission: RE | Admit: 2015-12-21 | Discharge: 2015-12-21 | Disposition: A | Discharge status: Home / Self Care | Payer: Medicare Other | Source: Ambulatory Visit | Attending: Neurology | Admitting: Neurology

## 2015-12-21 DIAGNOSIS — R269 Unspecified abnormalities of gait and mobility: Secondary | ICD-10-CM

## 2015-12-21 MED ORDER — GADOBENATE DIMEGLUMINE 529 MG/ML IV SOLN
15.0000 mL | Freq: Once | INTRAVENOUS | Status: AC | PRN
  Administered 2015-12-21: 15 mL via INTRAVENOUS

## 2015-12-23 ENCOUNTER — Telehealth: Payer: Self-pay | Admitting: Neurology

## 2015-12-23 NOTE — Telephone Encounter (Signed)
  I called the patient. The MRI of the brain is essentially normal. Blood work looked good as well.   MRI brain 12/22/15:  IMPRESSION: This MRI of the brain with and without contrast shows the following: 1. Mild generalized cortical atrophy. 2. Small number of T2/FLAIR hyperintense foci in the pons and hemispheres consistent with minimal chronic microvascular ischemic age, typical for age.  3. There is a normal enhancement pattern. There are no acute findings.

## 2016-01-01 DIAGNOSIS — H1013 Acute atopic conjunctivitis, bilateral: Secondary | ICD-10-CM | POA: Diagnosis not present

## 2016-01-14 ENCOUNTER — Ambulatory Visit
Admission: RE | Admit: 2016-01-14 | Discharge: 2016-01-14 | Disposition: A | Discharge status: Home / Self Care | Payer: Medicare Other | Source: Ambulatory Visit | Attending: Family Medicine | Admitting: Family Medicine

## 2016-01-14 ENCOUNTER — Other Ambulatory Visit: Payer: Self-pay | Admitting: Family Medicine

## 2016-01-14 DIAGNOSIS — R079 Chest pain, unspecified: Secondary | ICD-10-CM

## 2016-01-14 DIAGNOSIS — R0789 Other chest pain: Secondary | ICD-10-CM | POA: Diagnosis not present

## 2016-01-16 DIAGNOSIS — H1013 Acute atopic conjunctivitis, bilateral: Secondary | ICD-10-CM | POA: Diagnosis not present

## 2016-01-21 ENCOUNTER — Encounter (HOSPITAL_COMMUNITY): Payer: Self-pay | Admitting: Emergency Medicine

## 2016-01-21 ENCOUNTER — Observation Stay (HOSPITAL_COMMUNITY)
Admission: EM | Admit: 2016-01-21 | Discharge: 2016-01-22 | Disposition: A | Discharge status: Home / Self Care | Payer: Medicare Other | Attending: Internal Medicine | Admitting: Internal Medicine

## 2016-01-21 ENCOUNTER — Emergency Department (HOSPITAL_COMMUNITY): Payer: Medicare Other

## 2016-01-21 DIAGNOSIS — R079 Chest pain, unspecified: Secondary | ICD-10-CM | POA: Diagnosis present

## 2016-01-21 DIAGNOSIS — D72819 Decreased white blood cell count, unspecified: Secondary | ICD-10-CM | POA: Diagnosis not present

## 2016-01-21 DIAGNOSIS — R072 Precordial pain: Secondary | ICD-10-CM | POA: Diagnosis not present

## 2016-01-21 DIAGNOSIS — Z8249 Family history of ischemic heart disease and other diseases of the circulatory system: Secondary | ICD-10-CM | POA: Insufficient documentation

## 2016-01-21 DIAGNOSIS — R918 Other nonspecific abnormal finding of lung field: Secondary | ICD-10-CM | POA: Diagnosis not present

## 2016-01-21 DIAGNOSIS — I10 Essential (primary) hypertension: Secondary | ICD-10-CM | POA: Diagnosis not present

## 2016-01-21 DIAGNOSIS — F418 Other specified anxiety disorders: Secondary | ICD-10-CM

## 2016-01-21 DIAGNOSIS — Z7982 Long term (current) use of aspirin: Secondary | ICD-10-CM | POA: Insufficient documentation

## 2016-01-21 DIAGNOSIS — K219 Gastro-esophageal reflux disease without esophagitis: Secondary | ICD-10-CM

## 2016-01-21 DIAGNOSIS — F329 Major depressive disorder, single episode, unspecified: Secondary | ICD-10-CM | POA: Diagnosis not present

## 2016-01-21 DIAGNOSIS — F419 Anxiety disorder, unspecified: Secondary | ICD-10-CM | POA: Diagnosis not present

## 2016-01-21 DIAGNOSIS — E222 Syndrome of inappropriate secretion of antidiuretic hormone: Secondary | ICD-10-CM

## 2016-01-21 DIAGNOSIS — R0789 Other chest pain: Principal | ICD-10-CM | POA: Insufficient documentation

## 2016-01-21 DIAGNOSIS — R9389 Abnormal findings on diagnostic imaging of other specified body structures: Secondary | ICD-10-CM | POA: Insufficient documentation

## 2016-01-21 LAB — BASIC METABOLIC PANEL
Anion gap: 9 (ref 5–15)
BUN: 17 mg/dL (ref 6–20)
CHLORIDE: 101 mmol/L (ref 101–111)
CO2: 26 mmol/L (ref 22–32)
Calcium: 9.6 mg/dL (ref 8.9–10.3)
Creatinine, Ser: 0.84 mg/dL (ref 0.44–1.00)
GFR calc Af Amer: >60 mL/min (ref >60)
GFR calc non Af Amer: >60 mL/min (ref >60)
GLUCOSE: 91 mg/dL (ref 65–99)
POTASSIUM: 4.9 mmol/L (ref 3.5–5.1)
SODIUM: 136 mmol/L (ref 135–145)

## 2016-01-21 LAB — I-STAT TROPONIN, ED
Troponin i, poc: 0 ng/mL (ref 0.00–0.08)
Troponin i, poc: 0 ng/mL (ref 0.00–0.08)

## 2016-01-21 LAB — TROPONIN I
Troponin I: <0.03 ng/mL (ref <0.031)
Troponin I: <0.03 ng/mL (ref <0.031)

## 2016-01-21 LAB — CBC
HEMATOCRIT: 36.1 % (ref 36.0–46.0)
HEMOGLOBIN: 12.5 g/dL (ref 12.0–15.0)
MCH: 31.7 pg (ref 26.0–34.0)
MCHC: 34.6 g/dL (ref 30.0–36.0)
MCV: 91.6 fL (ref 78.0–100.0)
Platelets: 218 10*3/uL (ref 150–400)
RBC: 3.94 MIL/uL (ref 3.87–5.11)
RDW: 13.5 % (ref 11.5–15.5)
WBC: 3.5 10*3/uL — AB (ref 4.0–10.5)

## 2016-01-21 LAB — D-DIMER, QUANTITATIVE (NOT AT ARMC): D DIMER QUANT: 0.46 ug{FEU}/mL (ref 0.00–0.50)

## 2016-01-21 MED ORDER — ACETAMINOPHEN 325 MG PO TABS
650.0000 mg | ORAL_TABLET | ORAL | Status: DC | PRN
  Administered 2016-01-22: 650 mg via ORAL

## 2016-01-21 MED ORDER — NITROGLYCERIN 0.4 MG SL SUBL
0.4000 mg | SUBLINGUAL_TABLET | SUBLINGUAL | Status: DC | PRN
  Filled 2016-01-21: qty 1

## 2016-01-21 MED ORDER — ZOLPIDEM TARTRATE 5 MG PO TABS
10.0000 mg | ORAL_TABLET | Freq: Every evening | ORAL | Status: DC | PRN
  Administered 2016-01-22: 10 mg via ORAL
  Filled 2016-01-21 (×2): qty 2

## 2016-01-21 MED ORDER — ONDANSETRON HCL 4 MG/2ML IJ SOLN: 4.0000 mg | Freq: Four times a day (QID) | INTRAMUSCULAR | Status: DC | PRN

## 2016-01-21 MED ORDER — HEPARIN SODIUM (PORCINE) 5000 UNIT/ML IJ SOLN
5000.0000 [IU] | Freq: Three times a day (TID) | INTRAMUSCULAR | Status: DC
  Administered 2016-01-21 – 2016-01-22 (×2): 5000 [IU] via SUBCUTANEOUS
  Filled 2016-01-21 (×2): qty 1

## 2016-01-21 MED ORDER — SODIUM CHLORIDE 1 G PO TABS
1.0000 g | ORAL_TABLET | Freq: Two times a day (BID) | ORAL | Status: DC
  Administered 2016-01-22: 1 g via ORAL
  Filled 2016-01-21: qty 1

## 2016-01-21 MED ORDER — GI COCKTAIL ~~LOC~~
30.0000 mL | Freq: Four times a day (QID) | ORAL | Status: DC | PRN
  Administered 2016-01-21: 30 mL via ORAL
  Filled 2016-01-21: qty 30

## 2016-01-21 MED ORDER — ASPIRIN EC 325 MG PO TBEC
325.0000 mg | DELAYED_RELEASE_TABLET | Freq: Every day | ORAL | Status: DC
  Administered 2016-01-21: 325 mg via ORAL
  Filled 2016-01-21: qty 1

## 2016-01-21 MED ORDER — DULOXETINE HCL 60 MG PO CPEP
60.0000 mg | ORAL_CAPSULE | Freq: Every day | ORAL | Status: DC
  Administered 2016-01-21 – 2016-01-22 (×2): 60 mg via ORAL
  Filled 2016-01-21 (×2): qty 1

## 2016-01-21 MED ORDER — SODIUM CHLORIDE 0.9 % IV SOLN: INTRAVENOUS | Status: DC

## 2016-01-21 MED ORDER — PANTOPRAZOLE SODIUM 40 MG PO TBEC: 40.0000 mg | DELAYED_RELEASE_TABLET | Freq: Every day | ORAL | Status: DC

## 2016-01-21 MED ORDER — METOPROLOL SUCCINATE ER 25 MG PO TB24: 25.0000 mg | ORAL_TABLET | Freq: Every day | ORAL | Status: DC

## 2016-01-21 MED ORDER — METOPROLOL SUCCINATE ER 25 MG PO TB24
25.0000 mg | ORAL_TABLET | Freq: Every day | ORAL | Status: DC
  Administered 2016-01-21 – 2016-01-22 (×2): 25 mg via ORAL
  Filled 2016-01-21 (×2): qty 1

## 2016-01-21 MED ORDER — DIAZEPAM 5 MG PO TABS
10.0000 mg | ORAL_TABLET | Freq: Three times a day (TID) | ORAL | Status: DC | PRN
Start: 2016-01-21 — End: 2016-01-22
  Administered 2016-01-21: 10 mg via ORAL
  Filled 2016-01-21: qty 2

## 2016-01-21 MED ORDER — ASPIRIN 81 MG PO CHEW
324.0000 mg | CHEWABLE_TABLET | Freq: Once | ORAL | Status: AC
  Administered 2016-01-21: 324 mg via ORAL
  Filled 2016-01-21: qty 4

## 2016-01-21 NOTE — Progress Notes (Signed)
Pt arrived from the ED. Assessment completed see flow sheet, pt placed on tele ccmd notified, pt oriented to room and staff, call ight within reach bed in lowest position, will continue to monitor

## 2016-01-21 NOTE — ED Provider Notes (Signed)
CSN: [id number]     Arrival date & time 01/21/16  1052 History   First MD Initiated Contact with Patient 01/21/16 1225     Chief Complaint  Patient presents with  . Chest Pain     (Consider location/radiation/quality/duration/timing/severity/associated sxs/prior Treatment) Patient is a 70 y.o. female presenting with chest pain.  Chest Pain Associated symptoms: diaphoresis (night sweats, not soaking pajamas), nausea (unchanged from baseline) and shortness of breath   Associated symptoms: no abdominal pain, no back pain, no cough, no fever, no headache, no numbness (bilat peripheral neuropathy), not vomiting and no weakness   Sharp piercing pain middle of left side of chest weeks ago, gradually eased up over the next two weeks. 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 It was waxing and waning, tried to get in to see doctor.  Discomfort has spread.  Felt dyspnea while doing things. Orthostatic hypotension several times yesterday. Overwhelming fatigue.  Just moved here one year ago.  Constant CP for 10 days, degree of pain waxing and waning.  Now radiating to shoulder, upper back. Changing quality, was pressure, now dull aching, mild Not exertional pain,not pleuritic,not positional   Shortness of breath, minimal with exertion.  No hx chol, DM, smoking other drugs  Father was smoker died at 53 of MI  Past Medical History  Diagnosis Date  . HTN (hypertension)   . Diverticulosis   . Diverticulitis   . SIADH (syndrome of inappropriate ADH production) ([facility])   . Anxiety   . GERD (gastroesophageal reflux disease)   . Cardiac arrhythmia   . Allergy   . Anemia   . Arthritis     djd  . Heart murmur   . Neuromuscular disorder ([place])     idiopathic peripheral  neuropathy  . Peripheral neuropathy ([place])   . Depression    Past Surgical History  Procedure Laterality Date  . Lumbar disc surgery    . Lumbar laminectomy    . Tonsillectomy    . Breast biopsy Left   . Colonoscopy     . Upper gastrointestinal endoscopy     Family History  Problem Relation Age of Onset  . Breast cancer Mother   . Glaucoma Mother   . Tuberculosis Paternal Grandmother   . Prostate cancer Paternal Grandfather   . Colon cancer Neg Hx   . Esophageal cancer Neg Hx   . Rectal cancer Neg Hx   . Stomach cancer Neg Hx   . Heart attack Father    Social History  Substance Use Topics  . Smoking status: Never Smoker   . Smokeless tobacco: Never Used  . Alcohol Use: 4.2 oz/week    0 Standard drinks or equivalent, 7 Glasses of wine per week     Comment:  1 glass of wine daily   OB History    No data available     Review of Systems  Constitutional: Positive for diaphoresis (night sweats, not soaking pajamas). Negative for fever.  HENT: Negative for congestion and sore throat.   Eyes: Negative for visual disturbance.  Respiratory: Positive for shortness of breath. Negative for cough.   Cardiovascular: Positive for chest pain.  Gastrointestinal: Positive for nausea (unchanged from baseline). Negative for vomiting and abdominal pain.  Genitourinary: Negative for dysuria.  Musculoskeletal: Negative for back pain.  Skin: Negative for rash.  Neurological: Positive for light-headedness. Negative for syncope, weakness, numbness (bilat peripheral neuropathy) and headaches.      Allergies  Ciprofloxacin; Dilaudid; Flagyl; and Visipaque  Home Medications  Prior to Admission medications   Medication Sig Start Date End Date Taking? Authorizing Provider  aspirin 325 MG tablet Take 325 mg by mouth daily.   Yes Historical Provider, MD  cetirizine (ZYRTEC) 10 MG tablet Take 10 mg by mouth daily.   Yes Historical Provider, MD  diazepam (VALIUM) 10 MG tablet Take 10 mg by mouth every 8 (eight) hours as needed for anxiety.    Yes Historical Provider, MD  diphenoxylate-atropine (LOMOTIL) 2.5-0.025 MG tablet Take 1 tablet by mouth 4 (four) times daily as needed for diarrhea or loose stools.  Reported on 10/04/2015   Yes Historical Provider, MD  DULoxetine (CYMBALTA) 20 MG capsule Take 60 mg by mouth daily.    Yes Historical Provider, MD  esomeprazole (NEXIUM) 20 MG capsule Take 20 mg by mouth daily at 12 noon.   Yes Historical Provider, MD  ibuprofen (ADVIL) 200 MG tablet Take 200 mg by mouth as needed for moderate pain. Reported on 10/04/2015   Yes Historical Provider, MD  metoprolol succinate (TOPROL-XL) 25 MG 24 hr tablet Take 25 mg by mouth daily.   Yes Historical Provider, MD  ondansetron (ZOFRAN) 4 MG tablet Take 4 mg by mouth every 8 (eight) hours as needed for nausea or vomiting. Reported on 10/04/2015   Yes Historical Provider, MD  Probiotic Product (ALIGN PO) Take by mouth daily.   Yes Historical Provider, MD  sodium chloride 1 g tablet Take 1 g by mouth 2 (two) times daily with a meal.   Yes Historical Provider, MD  zolpidem (AMBIEN) 10 MG tablet Take 10 mg by mouth at bedtime as needed for sleep.   Yes Historical Provider, MD   BP 144/77 mmHg  Pulse 77  Temp(Src) 97.9 F (36.6 C) (Oral)  Resp 16  SpO2 99% Physical Exam  Constitutional: She is oriented to person, place, and time. She appears well-developed and well-nourished. No distress.  HENT:  Head: Normocephalic and atraumatic.  Eyes: Conjunctivae and EOM are normal.  Neck: Normal range of motion.  Cardiovascular: Normal rate, regular rhythm, normal heart sounds and intact distal pulses.  Exam reveals no gallop and no friction rub.   No murmur heard. Pulmonary/Chest: Effort normal and breath sounds normal. No respiratory distress. She has no wheezes. She has no rales.  Abdominal: Soft. She exhibits no distension. There is no tenderness. There is no guarding.  Musculoskeletal: She exhibits no edema or tenderness.  Neurological: She is alert and oriented to person, place, and time.  Skin: Skin is warm and dry. No rash noted. She is not diaphoretic. No erythema.  Nursing note and vitals reviewed.   ED Course   Procedures (including critical care time) Labs Review Labs Reviewed  CBC - Abnormal; Notable for the following:    WBC 3.5 (*)    All other components within normal limits  BASIC METABOLIC PANEL  D-DIMER, QUANTITATIVE (NOT AT [facility])  TROPONIN I  TROPONIN I  TROPONIN I  I-STAT TROPOININ, ED  Randolm Idol, ED    Imaging Review Dg Chest 2 View  01/21/2016  CLINICAL DATA:  Intermittent chest pain for 1 month EXAM: CHEST  2 VIEW COMPARISON:  01/14/2016 FINDINGS: The heart size and vascular pattern are normal. There is no consolidation effusion or pneumothorax. There is a 2 cm nodular opacity over the right upper lobe. It is uncertain whether this is within the long or external to the patient. IMPRESSION: 2 cm rounded opacity over the right upper lobe. This may represent an object external  to the patient such as an EKG lead. A pulmonary nodule is not excluded. Study is otherwise negative. Electronically Signed   By: Skipper Cliche M.D.   On: 01/21/2016 12:12   I have personally reviewed and evaluated these images and lab results as part of my medical decision-making.   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 Differential diagnosis for chest pain includes pulmonary embolus, dissection, pneumothorax, pneumonia, ACS, myocarditis, pericarditis.  EKG was done and evaluate by me and showed  nonspecific ST changes and no signs of pericarditis. Chest x-ray was done and evaluated by me and radiology and showed  no sign of pneumonia or pneumothorax, sign of ECG lead in place.  Patient is low risk [place] with negative ddimer and have low suspicion for PE.  Delta troponins which were both negative. Normal pulses bilaterally, normal XR, doubt dissection. Given this evaluation, history and physical have low suspicion for pulmonary embolus, pneumonia, ACS, myocarditis, pericarditis, dissection.   Patient has a high risk heart score, with chest pain and some exertional dyspnea and will be admitted for chest pain observation. Given ASA.    Gareth Morgan, MD 01/21/16 [phone]

## 2016-01-21 NOTE — ED Notes (Signed)
Pt here from home with c/o chest pain that has been off and on for 4 weeks but.  Pain with intermittent but now constant and now general pain and is spreading

## 2016-01-21 NOTE — Consult Note (Signed)
CARDIOLOGY CONSULT NOTE  Patient ID: Katrina Benitez, MRN: [id number], DOB/AGE: Aug 21, 1946 70 y.o. 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 The patient describes a sharp chest pain over the left chest and a localized area that started about 6 weeks ago. Over the last 1-2 weeks the pain has moved into the center of her chest. It now feels like a dull ache. There are no clear exacerbating or alleviating factors. She has had some tingling in her fingers but no other associated symptoms. She specifically denies orthopnea, PND, or leg swelling. There is no pleuritic component to her pain. There is no cough, fever, or chills. She has no past history of cardiac disease. She does have hypertension but no history of hyperlipidemia, tobacco use, or diabetes. Her father had a myocardial infarction in his 74s but he was a very heavy smoker.  Medical History:  Past Medical History  Diagnosis Date  . HTN (hypertension)   . Diverticulosis   . Diverticulitis   . SIADH (syndrome of inappropriate ADH production) ([place])   . Anxiety   . GERD (gastroesophageal reflux disease)   . Cardiac arrhythmia   . Allergy   . Anemia   . Arthritis     djd  . Heart murmur   . Neuromuscular disorder ([place])     idiopathic peripheral  neuropathy  . Peripheral neuropathy ([place])   . Depression       Surgical History:  Past Surgical History  Procedure Laterality Date  . Lumbar disc surgery    . Lumbar laminectomy    . Tonsillectomy    . Breast biopsy Left   . Colonoscopy    . Upper gastrointestinal endoscopy       Home Meds: Prior to Admission medications   Medication Sig Start Date End Date Taking? Authorizing Provider    aspirin 325 MG tablet Take 325 mg by mouth daily.   Yes Historical Provider, MD  cetirizine (ZYRTEC) 10 MG tablet Take 10 mg by mouth daily.   Yes Historical Provider, MD  diazepam (VALIUM) 10 MG tablet Take 10 mg by mouth every 8 (eight) hours as needed for anxiety.    Yes Historical Provider, MD  diphenoxylate-atropine (LOMOTIL) 2.5-0.025 MG tablet Take 1 tablet by mouth 4 (four) times daily as needed for diarrhea or loose stools. Reported on 10/04/2015   Yes Historical Provider, MD  DULoxetine (CYMBALTA) 20 MG capsule Take 60 mg by mouth daily.    Yes Historical Provider, MD  esomeprazole (NEXIUM) 20 MG capsule Take 20 mg by mouth daily at 12 noon.   Yes Historical Provider, MD  ibuprofen (ADVIL) 200 MG tablet Take 200 mg by mouth as needed for moderate pain. Reported on 10/04/2015   Yes Historical Provider, MD  metoprolol succinate (TOPROL-XL) 25 MG 24 hr tablet Take 25 mg by mouth daily.   Yes Historical Provider, MD  ondansetron (ZOFRAN) 4 MG tablet Take 4 mg by mouth every 8 (eight) hours as needed for nausea or vomiting. Reported on 10/04/2015   Yes Historical Provider, MD  Probiotic Product (ALIGN PO) Take by mouth daily.   Yes Historical Provider, MD  sodium chloride 1 g tablet Take 1 g by mouth 2 (two) times daily with a meal.   Yes  Historical Provider, MD  zolpidem (AMBIEN) 10 MG tablet Take 10 mg by mouth at bedtime as needed for sleep.   Yes Historical Provider, MD    Inpatient Medications:  . aspirin EC  325 mg Oral Daily  . DULoxetine  60 mg Oral Daily  . heparin  5,000 Units Subcutaneous Q8H  . [START ON 01/22/2016] metoprolol succinate  25 mg Oral Daily  . [START ON 01/22/2016] pantoprazole  40 mg Oral Daily      Allergies:  Allergies  Allergen Reactions  . Ciprofloxacin Hives  . Dilaudid [Hydromorphone Hcl] Hives  . Flagyl [Metronidazole] Hives  . Visipaque [Iodixanol] Swelling    Throat swelling    Social History   Social History  . Marital Status: Married     Spouse Name: N/A  . Number of Children: 0  . Years of Education: College   Occupational History  . retired physician    Social History Main Topics  . Smoking status: Never Smoker   . Smokeless tobacco: Never Used  . Alcohol Use: 4.2 oz/week    0 Standard drinks or equivalent, 7 Glasses of wine per week     Comment:  1 glass of wine daily  . Drug Use: No  . Sexual Activity: Not on file   Other Topics Concern  . Not on file   Social History Narrative   Lives at home with her husband.   Right-handed.   1-2 cups caffeine per day.     Family History  Problem Relation Age of Onset  . Breast cancer Mother   . Glaucoma Mother   . Tuberculosis Paternal Grandmother   . Prostate cancer Paternal Grandfather   . Colon cancer Neg Hx   . Esophageal cancer Neg Hx   . Rectal cancer Neg Hx   . Stomach cancer Neg Hx   . Heart attack Father      Review of Systems: Negative except as outlined in the HPI   Physical Exam: Blood pressure 144/77, pulse 77, temperature 97.9 F (36.6 C), temperature source Oral, resp. rate 16, SpO2 99 %. Pt is alert and oriented, WD, WN, in no distress. HEENT: normal Neck: JVP normal. Carotid upstrokes normal without bruits. No thyromegaly. Lungs: equal expansion, clear bilaterally CV: Apex is discrete and nondisplaced, RRR with 2/6 SEM at the LSB Abd: soft, NT, +BS, no bruit, no hepatosplenomegaly Back: no CVA tenderness Ext: no C/C/E        DP/PT pulses intact and = Skin: warm and dry without rash Neuro: CNII-XII intact             Strength intact = bilaterally    Labs: No results for input(s): CKTOTAL, CKMB, TROPONINI in the last 72 hours. Lab Results  Component Value Date   WBC 3.5* 01/21/2016   HGB 12.5 01/21/2016   HCT 36.1 01/21/2016   MCV 91.6 01/21/2016   PLT 218 01/21/2016    Recent Labs Lab 01/21/16 1121  NA 136  K 4.9  CL 101  CO2 26  BUN 17  CREATININE 0.84  CALCIUM 9.6  GLUCOSE 91   No results found for: CHOL, HDL,  LDLCALC, TRIG Lab Results  Component Value Date   DDIMER 0.46 01/21/2016    Radiology/Studies:  Dg Chest 2 View  01/21/2016  CLINICAL DATA:  Intermittent chest pain for 1 month EXAM: CHEST  2 VIEW COMPARISON:  01/14/2016 FINDINGS: The heart size and vascular pattern are normal. There is no consolidation effusion or pneumothorax. There is a  2 cm nodular opacity over the right upper lobe. It is uncertain whether this is within the long or external to the patient. IMPRESSION: 2 cm rounded opacity over the right upper lobe. This may represent an object external to the patient such as an EKG lead. A pulmonary nodule is not excluded. Study is otherwise negative. Electronically Signed   By: Skipper Cliche M.D.   On: 01/21/2016 12:12   Dg Chest 2 View  01/14/2016  CLINICAL DATA:  Left upper anterior chest pain, 3 months duration. EXAM: CHEST  2 VIEW COMPARISON:  None. FINDINGS: Heart size is normal. Mediastinal shadows are normal. The lungs are clear. The vascularity is normal. No effusions. Ordinary mild degenerative changes affect the thoracic spine. Asymmetric density associated with the left first rib and does not represent a pulmonary nodule. IMPRESSION: No active disease.  No cause of left-sided chest pain identified. Electronically Signed   By: Nelson Chimes M.D.   On: 01/14/2016 16:06    EKG: NSR, within normal limits  Cardiac Studies: pending  ASSESSMENT AND PLAN:  Chest pain with typical and atypical features. Pain has waxed and waned over several days but has been present most of the time. Two negative troponins make ischemic chest pain unlikely. She has ruled out for MI. 2D echo is ordered. Will order Lexiscan Myoview stress test tomorrow am. As long as stress test is low risk, I don't think further cardiac evaluation will be necessary. We reviewed other considerations in the differential dx of chest pain and I think costochondritis is most-likely if cardiac evaluation turns out to be  negative.   SignedSherren Mocha MD, [facility] 01/21/2016, 6:30 PM

## 2016-01-21 NOTE — H&P (Signed)
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 Symptoms begun about 5 weeks ago, at which time there were sharp, substernal, then gradually subsiding for which she thaught symptoms were musculoskeletal in nature, and did not seek medical attention until 10 days ago, when they became constant, currently at 2-6 out of 10, not worse with movement, nonreproducible, and at the time of presentation accompanied by diaphoresis, and nausea. She also reported shortness of breath on exertion. She also complained of orthostatic hypotension several times a day with BPs falling into the 80's/ 50s. She took ASA on admission without improvement of symptoms. She did not take any NTG. She reports  Increased stress over the last year since moving from [place] to [place], having moved several times and dealing with her husband recent retirement  Denies fevers, chills, night sweats, vision changes, or mucositis. Denies lower extremity swelling. She reports intermittent nausea, heartburn but no change in bowel habits. Denies abdominal pain. Appetite is normal. Denies any dysuria. Denies abnormal skin rashes  Denies any bleeding issues such as epistaxis, hematemesis, hematuria or hematochezia. Ambulating without difficulty.Does not smoke. +Fam history of MI in Father. She has been seen in the past by a cardiologist for PSVTs  Where an Echo and Holter returned negative for abnormalities. She has been lately seen by various specialties due to multiple medical issues including negative workup for MS and Upper endoscopy for hiatal hernia.    ED Course:  BP 133/88 mmHg  Pulse  67  Temp(Src) 97.9 F (36.6 C) (Oral)  Resp 19  SpO2 99% EKG NSR with sinus arrhythmia QTC 399 Tn negative to date. CXR  Normal. At the ED received ASA without symptrom improvement.  She will be admitted for observation in telemetry  Review of Systems: As per HPI otherwise 10 point review of systems negative.   Past Medical History  Diagnosis Date  . HTN (hypertension)   . Diverticulosis   . Diverticulitis   . SIADH (syndrome of inappropriate ADH production) ([place])   . Anxiety   . GERD (gastroesophageal reflux disease)   . Cardiac arrhythmia   . Allergy   . Anemia   . Arthritis     djd  . Heart murmur   . Neuromuscular disorder ([place])     idiopathic peripheral  neuropathy  . Peripheral neuropathy ([place])   . Depression     Past Surgical History  Procedure Laterality Date  . Lumbar disc surgery    . Lumbar laminectomy    . Tonsillectomy    . Breast biopsy Left   . Colonoscopy    . Upper gastrointestinal endoscopy       reports that she has never smoked. She has never used smokeless tobacco. She reports that she drinks about 4.2 oz of alcohol per week. She reports that she does not use illicit drugs.  Allergies  Allergen Reactions  . Ciprofloxacin Hives  . Dilaudid [Hydromorphone Hcl] Hives  . Flagyl [Metronidazole] Hives  . Visipaque [Iodixanol] Swelling    Throat swelling    Family History  Problem Relation Age of Onset  . Breast cancer Mother   . Glaucoma  Mother   . Tuberculosis Paternal Grandmother   . Prostate cancer Paternal Grandfather   . Colon cancer Neg Hx   . Esophageal cancer Neg Hx   . Rectal cancer Neg Hx   . Stomach cancer Neg Hx   . Heart attack Father     Family history reviewed and not pertinent (If you reviewed it)  Prior to Admission medications   Medication Sig Start Date End Date Taking? Authorizing Provider  aspirin 325 MG tablet Take 325 mg by mouth daily.    Historical Provider, MD  diazepam (VALIUM) 10 MG tablet Take 10 mg by mouth  as needed for anxiety.    Historical Provider, MD  diphenoxylate-atropine (LOMOTIL) 2.5-0.025 MG tablet Take by mouth as needed for diarrhea or loose stools. Reported on 10/04/2015    Historical Provider, MD  DULoxetine (CYMBALTA) 20 MG capsule Take 40 mg by mouth daily.    Historical Provider, MD  esomeprazole (NEXIUM) 20 MG capsule Take 20 mg by mouth daily at 12 noon.    Historical Provider, MD  ibuprofen (ADVIL) 200 MG tablet Take 200 mg by mouth as needed. Reported on 10/04/2015    Historical Provider, MD  metoprolol succinate (TOPROL-XL) 25 MG 24 hr tablet Take 25 mg by mouth daily.    Historical Provider, MD  ondansetron (ZOFRAN) 4 MG tablet Take 4 mg by mouth as needed for nausea or vomiting. Reported on 10/04/2015    Historical Provider, MD  Probiotic Product (ALIGN PO) Take by mouth daily.    Historical Provider, MD  sodium chloride 1 g tablet Take 1 g by mouth 2 (two) times daily with a meal.    Historical Provider, MD  zolpidem (AMBIEN) 10 MG tablet Take 10 mg by mouth at bedtime as needed for sleep.    Historical Provider, MD    Physical Exam:    Filed Vitals:   01/21/16 1102 01/21/16 1230 01/21/16 1300 01/21/16 1330  BP: 129/79 144/82 129/87 133/88  Pulse: 80 67 69 67  Temp: 97.9 F (36.6 C)     TempSrc: Oral     Resp: 18 22 11 19   SpO2: 100% 94% 98% 99%      Constitutional: NAD, calm, comfortable Filed Vitals:   01/21/16 1102 01/21/16 1230 01/21/16 1300 01/21/16 1330  BP: 129/79 144/82 129/87 133/88  Pulse: 80 67 69 67  Temp: 97.9 F (36.6 C)     TempSrc: Oral     Resp: 18 22 11 19   SpO2: 100% 94% 98% 99%   Eyes: PERRL, lids and conjunctivae normal ENMT: Mucous membranes are moist. Posterior pharynx clear of any exudate or lesions.Normal dentition.  Neck: normal, supple, no masses, no thyromegaly Respiratory: clear to auscultation bilaterally, no wheezing, no crackles. Normal respiratory effort. No accessory muscle use.  Cardiovascular: Regular rate and rhythm,  very soft murmurs / no rubs / gallops. No extremity edema. 2+ pedal pulses. No carotid bruits.  Abdomen: no tenderness, no masses palpated. No hepatosplenomegaly. Bowel sounds positive.  Musculoskeletal: no clubbing / cyanosis. No joint deformity upper and lower extremities. Good ROM, no contractures. Normal muscle tone.  Skin: no rashes, lesions, ulcers. No induration Neurologic: CN 2-12 grossly intact. Sensation intact, DTR normal. Strength 5/5 in all 4.  Psychiatric: Normal judgment and insight. Alert and oriented x 3. Normal mood.     Labs on Admission: I have personally reviewed following labs and imaging studies  CBC:  Recent Labs Lab 01/21/16 1121  WBC 3.5*  HGB 12.5  HCT  36.1  MCV 91.6  PLT 99991111    Basic Metabolic Panel:  Recent Labs Lab 01/21/16 1121  NA 136  K 4.9  CL 101  CO2 26  GLUCOSE 91  BUN 17  CREATININE 0.84  CALCIUM 9.6     Radiological Exams on Admission: Dg Chest 2 View  01/21/2016  CLINICAL DATA:  Intermittent chest pain for 1 month EXAM: CHEST  2 VIEW COMPARISON:  01/14/2016 FINDINGS: The heart size and vascular pattern are normal. There is no consolidation effusion or pneumothorax. There is a 2 cm nodular opacity over the right upper lobe. It is uncertain whether this is within the long or external to the patient. IMPRESSION: 2 cm rounded opacity over the right upper lobe. This may represent an object external to the patient such as an EKG lead. A pulmonary nodule is not excluded. Study is otherwise negative. Electronically Signed   By: Skipper Cliche M.D.   On: 01/21/2016 12:12     Assessment/Plan Principal Problem:   Chest pain Active Problems:   SIADH (syndrome of inappropriate ADH production) (HCC)   Hypertension   Anxiety and depression   GERD (gastroesophageal reflux disease)       Chest pain syndrome, cardiac versus anxiety Heart score 6-7 EKG shows NSR with sinus arrhythmia QTC 399 Tn negative to date.  At the ED received ASA  without symptrom improvement. CMET and CBC are essentially normal.+Fam Hx MI   She will be admitted for observation in telemetry 2 D echo  Cycle troponin EKG in am -morphine, nitroglycerin continue ASA, O2 and NTG as needed  Will consider start statin GI cocktail  Cardiology consult.  Hypertension BP 133/88 mmHg  Pulse 67  Temp(Src) 97.9 F (36.6 C) (Oral)  Resp 19  SpO2 99% She reports that has been orthostatic. Check while in hospital  Continue home anti-hypertensive medications.     History of SIADH, patient takes NA tablets 2 g a day Current Na 136  Check BMET in am  Anxiety Depression Continue outpatient meds  GERD sp recent EGD for hiatal hernia.  GI Cocktail  Continue PPI  DVT prophylaxis:   Heparin  Code Status:   Full     Family Communication:  Discussed with patient and husband  Disposition Plan: Expect patient to be discharged to home Consults called:    None Admission status: Obs Tele  Zayquan Bogard E, PA-C [facility]   If 7PM-7AM, please contact night-coverage [email or web address] Password [facility]  01/21/2016, 4:29 PM

## 2016-01-22 ENCOUNTER — Observation Stay (HOSPITAL_COMMUNITY): Payer: Medicare Other

## 2016-01-22 DIAGNOSIS — R938 Abnormal findings on diagnostic imaging of other specified body structures: Secondary | ICD-10-CM

## 2016-01-22 DIAGNOSIS — R079 Chest pain, unspecified: Secondary | ICD-10-CM | POA: Diagnosis not present

## 2016-01-22 DIAGNOSIS — R918 Other nonspecific abnormal finding of lung field: Secondary | ICD-10-CM | POA: Diagnosis not present

## 2016-01-22 DIAGNOSIS — R072 Precordial pain: Secondary | ICD-10-CM | POA: Diagnosis not present

## 2016-01-22 DIAGNOSIS — R0789 Other chest pain: Secondary | ICD-10-CM | POA: Diagnosis not present

## 2016-01-22 DIAGNOSIS — R9389 Abnormal findings on diagnostic imaging of other specified body structures: Secondary | ICD-10-CM | POA: Insufficient documentation

## 2016-01-22 DIAGNOSIS — I499 Cardiac arrhythmia, unspecified: Secondary | ICD-10-CM | POA: Diagnosis not present

## 2016-01-22 LAB — TROPONIN I

## 2016-01-22 LAB — NM MYOCAR MULTI W/SPECT W/WALL MOTION / EF
CHL CUP MPHR: 151 {beats}/min
CHL CUP RESTING HR STRESS: 66 {beats}/min
CSEPEDS: 13 s
CSEPEW: 1 METS
CSEPHR: 64 %
Exercise duration (min): 12 min
Peak HR: 97 {beats}/min

## 2016-01-22 MED ORDER — SODIUM CHLORIDE 0.9 % IJ SOLN
80.0000 mg | INTRAMUSCULAR | Status: AC
  Administered 2016-01-22: 80 mg via INTRAVENOUS

## 2016-01-22 MED ORDER — TECHNETIUM TC 99M SESTAMIBI GENERIC - CARDIOLITE
10.0000 | Freq: Once | INTRAVENOUS | Status: AC | PRN
  Administered 2016-01-22: 10 via INTRAVENOUS

## 2016-01-22 MED ORDER — ACETAMINOPHEN 325 MG PO TABS
ORAL_TABLET | ORAL | Status: AC
  Filled 2016-01-22: qty 2

## 2016-01-22 MED ORDER — REGADENOSON 0.4 MG/5ML IV SOLN
0.4000 mg | Freq: Once | INTRAVENOUS | Status: AC
  Administered 2016-01-22: 0.4 mg via INTRAVENOUS
  Filled 2016-01-22: qty 5

## 2016-01-22 MED ORDER — REGADENOSON 0.4 MG/5ML IV SOLN
INTRAVENOUS | Status: AC
  Administered 2016-01-22: 0.4 mg via INTRAVENOUS
  Filled 2016-01-22: qty 5

## 2016-01-22 MED ORDER — TECHNETIUM TC 99M SESTAMIBI GENERIC - CARDIOLITE
30.0000 | Freq: Once | INTRAVENOUS | Status: AC | PRN
  Administered 2016-01-22: 30 via INTRAVENOUS

## 2016-01-22 MED ORDER — IBUPROFEN 200 MG PO TABS: 400.0000 mg | ORAL_TABLET | Freq: Four times a day (QID) | ORAL | Status: DC | PRN

## 2016-01-22 NOTE — Discharge Instructions (Signed)

## 2016-01-22 NOTE — Discharge Summary (Addendum)
Physician Discharge Summary  Katrina Benitez  [id number]  DOB: July 08, 1946  DOA: 01/21/2016  PCP: Eloise Levels, NP  Admit date: 01/21/2016 Discharge date: 01/22/2016  Time spent: Less than 30 minutes  Recommendations for Outpatient Follow-up:  1. Eloise Levels, NP/PCP in one week with repeat labs (CBC). Consider outpatient evaluation of lung nodule that was seen on chest x-ray in the hospital. 2. Sherren Mocha, Cardiology. Consider outpatient 2-D echocardiogram.  Discharge Diagnoses:  Principal Problem:   Chest pain Active Problems:   SIADH (syndrome of inappropriate ADH production) (HCC)   Hypertension   Anxiety and depression   GERD (gastroesophageal reflux disease)   Pain in the chest   Opacity noted on imaging study   Discharge Condition: Improved & Stable  Diet recommendation: Heart healthy diet.  There were no vitals filed for this visit.  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 She first noted chest pain over left side of chest approximately 6 weeks ago which worsened over the last 1-2 weeks and moved to the center of the chest. No pleuritic component. Denies associated dyspnea. No clear aggravating or relieving factors. Lifelong nonsmoker but exposed to passive smoking from father. Family history significant for father who had MI in his 72s but he was a heavy smoker. Admitted for further evaluation and management.  Hospital Course:   Chest pain, atypical - Had typical and atypical features which has been present for past several weeks. - Admitted to telemetry which showed no arrhythmias and remained in sinus rhythm. - EKG without acute changes. Troponin 3: Negative.  D-dimer negative. - Cardiology was consulted and performed Lexiscan Myoview stress test. Cardiology felt that costochondritis was most likely if cardiac workup turned out negative. - Chest pain improved and was intermittent and mild. - Stress test showed low risk findings and LVEF 66%. Cardiology cleared patient for discharge and recommended outpatient echocardiogram. - She was counseled to take ibuprofen 400 MG 3 times a day scheduled for 3-4 days for possible musculoskeletal etiology of her chest pain after which she could change it to when necessary. She verbalized understanding.  Abnormal chest x-ray - Chest x-ray 5/1 reported a 2 mm nodular opacity over RUL and indicated that a pulmonary nodule was not excluded. Chest x-ray was repeated on 5/2 which was reported as persistent vague nodular opacity in the RUL and recommendation for unenhanced CT of the chest was made by radiology. Of note, patient had chest x-ray on 4/24 which did not comment about this opacity. - Discussed findings in detail with patient and advised outpatient follow-up with PCP for consideration for further evaluation. May consider outpatient noncontrasted CT of the chest. She verbalized understanding.  SIADH - Sodium was normal. Continue home sodium tablets.  Essential hypertension - Reasonably controlled. Continue metoprolol  Anxiety and depression - Stable. Continue Lexapro and when necessary Valium  GERD -Continue PPI. Status post recent EGD.  Mild leukopenia - Unclear etiology and significance. Follow CBC as outpatient.  Consultations:  Cardiology   Procedures:  None  Discharge Exam:  Complaints: Chest pain has improved. Mild and intermittent.  Filed Vitals:   01/22/16 1215 01/22/16 1217 01/22/16 1219 01/22/16 1338  BP: 133/78 131/74 137/75 142/75  Pulse: 85 77 71 68  Temp:  98.2 F (36.8 C)  TempSrc:    Oral  Resp:    18  SpO2:    100%    General exam: Pleasant middle-aged female seen  ambulating comfortably in the room. Respiratory system: Clear. No increased work of breathing. Cardiovascular system: S1 & S2 heard, RRR. No JVD, murmurs, gallops, clicks or pedal edema. Telemetry: Sinus rhythm. Gastrointestinal system: Abdomen is nondistended, soft and nontender. Normal bowel sounds heard. Central nervous system: Alert and oriented. No focal neurological deficits. Extremities: Symmetric 5 x 5 power.  Discharge Instructions      Discharge Instructions    Call MD for:  difficulty breathing, headache or visual disturbances    Complete by:  As directed      Call MD for:  severe uncontrolled pain    Complete by:  As directed      Diet - low sodium heart healthy    Complete by:  As directed      Increase activity slowly    Complete by:  As directed             Medication List    TAKE these medications        ALIGN PO  Take by mouth daily.     AMBIEN 10 MG tablet  Generic drug:  zolpidem  Take 10 mg by mouth at bedtime as needed for sleep.     aspirin 325 MG tablet  Take 325 mg by mouth daily.     cetirizine 10 MG tablet  Commonly known as:  ZYRTEC  Take 10 mg by mouth daily.     CYMBALTA 20 MG capsule  Generic drug:  DULoxetine  Take 60 mg by mouth daily.     esomeprazole 20 MG capsule  Commonly known as:  NEXIUM  Take 20 mg by mouth daily at 12 noon.     ibuprofen 200 MG tablet  Commonly known as:  ADVIL  Take 2 tablets (400 mg total) by mouth every 6 (six) hours as needed for moderate pain (Take 2 tablets 3 times daily scheduled for 3-4 days, then take as needed as per instructions.).     LOMOTIL 2.5-0.025 MG tablet  Generic drug:  diphenoxylate-atropine  Take 1 tablet by mouth 4 (four) times daily as needed for diarrhea or loose stools. Reported on 10/04/2015     metoprolol succinate 25 MG 24 hr tablet  Commonly known as:  TOPROL-XL  Take 25 mg by mouth daily.     ondansetron 4 MG tablet  Commonly known as:  ZOFRAN  Take 4 mg by mouth  every 8 (eight) hours as needed for nausea or vomiting. Reported on 10/04/2015     sodium chloride 1 g tablet  Take 1 g by mouth 2 (two) times daily with a meal.     VALIUM 10 MG tablet  Generic drug:  diazepam  Take 10 mg by mouth every 8 (eight) hours as needed for anxiety.       Follow-up Information    Follow up with Eloise Levels, NP. Schedule an appointment as soon as possible for a visit in 1 week.   Specialty:  Nurse Practitioner   Why:  to be seen with repeat labs (CBC). Consider outpatient evlauation of lung nodule seen on chest XRay.   Contact information:   [id number] [address] [phone]       Schedule an appointment as soon as possible for a visit with Sherren Mocha, MD.   Specialty:  Cardiology   Contact information:   [id number] [address] [phone]       Get Medicines reviewed and adjusted: Please take all your medications with you for your next visit with your Primary MD  Please request your Primary MD to go over all hospital tests and procedure/radiological results at the follow up. Please ask your Primary MD to get all Hospital records sent to his/her office.  If you experience worsening of your admission symptoms, develop shortness of breath, life threatening emergency, suicidal or homicidal thoughts you must seek medical attention immediately by calling 911 or calling your MD immediately if symptoms less severe.  You must read complete instructions/literature along with all the possible adverse reactions/side effects for all the Medicines you take and that have been prescribed to you. Take any new Medicines after you have completely understood and accept all the possible adverse reactions/side effects.   Do not drive when taking pain medications.   Do not take more than prescribed Pain, Sleep and Anxiety Medications  Special Instructions: If you have smoked or chewed Tobacco in the last 2 yrs  please stop smoking, stop any regular Alcohol and or any Recreational drug use.  Wear Seat belts while driving.  Please note  You were cared for by a hospitalist during your hospital stay. Once you are discharged, your primary care physician will handle any further medical issues. Please note that NO REFILLS for any discharge medications will be authorized once you are discharged, as it is imperative that you return to your primary care physician (or establish a relationship with a primary care physician if you do not have one) for your aftercare needs so that they can reassess your need for medications and monitor your lab values.    The results of significant diagnostics from this hospitalization (including imaging, microbiology, ancillary and laboratory) are listed below for reference.    Significant Diagnostic Studies: Dg Chest 2 View  01/22/2016  CLINICAL DATA:  Follow-up nodular opacity in right upper lobe EXAM: CHEST  2 VIEW COMPARISON:  01/21/2016 and 01/14/2016 FINDINGS: Cardiomediastinal silhouette is stable. Persistent vague nodular opacity in right upper lobe. Although this may be due to superimposed rib shadow a lung nodule cannot be entirely excluded. Further correlation with unenhanced CT of the chest is recommended. Mild degenerative changes mid thoracic spine. IMPRESSION: Persistent vague nodular opacity in right upper lobe. Although this may be due to superimposed rib shadow a lung nodule cannot be entirely excluded. Further correlation with unenhanced CT of the chest is recommended. Electronically Signed   By: Lahoma Crocker M.D.   On: 01/22/2016 13:13   Dg Chest 2 View  01/21/2016  CLINICAL DATA:  Intermittent chest pain for 1 month EXAM: CHEST  2 VIEW COMPARISON:  01/14/2016 FINDINGS: The heart size and vascular pattern are normal. There is no consolidation effusion or pneumothorax. There is a 2 cm nodular opacity over the right upper lobe. It is uncertain whether this is within the  long or external to the patient. IMPRESSION: 2 cm rounded opacity over the right upper lobe. This may represent an object external to the patient such as an EKG lead. A pulmonary nodule is not excluded. Study is otherwise negative. Electronically Signed   By: Skipper Cliche M.D.   On: 01/21/2016 12:12   Dg Chest 2 View  01/14/2016  CLINICAL DATA:  Left upper anterior chest pain, 3 months duration. EXAM: CHEST  2 VIEW COMPARISON:  None. FINDINGS:  Heart size is normal. Mediastinal shadows are normal. The lungs are clear. The vascularity is normal. No effusions. Ordinary mild degenerative changes affect the thoracic spine. Asymmetric density associated with the left first rib and does not represent a pulmonary nodule. IMPRESSION: No active disease.  No cause of left-sided chest pain identified. Electronically Signed   By: Nelson Chimes M.D.   On: 01/14/2016 16:06   Nm Myocar Multi W/spect W/wall Motion / Ef  01/22/2016  CLINICAL DATA:  Chest pain, cardiac arrhythmia, heart murmur EXAM: MYOCARDIAL IMAGING WITH SPECT (REST AND PHARMACOLOGIC-STRESS) GATED LEFT VENTRICULAR WALL MOTION STUDY LEFT VENTRICULAR EJECTION FRACTION TECHNIQUE: Standard myocardial SPECT imaging was performed after resting intravenous injection of 10 mCi Tc-41m sestamibi. Subsequently, intravenous infusion of Lexiscan was performed under the supervision of the Cardiology staff. At peak effect of the drug, 30 mCi Tc-28m sestamibi was injected intravenously and standard myocardial SPECT imaging was performed. Quantitative gated imaging was also performed to evaluate left ventricular wall motion, and estimate left ventricular ejection fraction. COMPARISON:  None. FINDINGS: Perfusion: No decreased activity in the left ventricle on stress imaging to suggest reversible ischemia or infarction. Wall Motion: Normal left ventricular wall motion. No left ventricular dilation. Left Ventricular Ejection Fraction: 66 % End diastolic volume 81 ml End systolic  volume 28 ml IMPRESSION: 1. No reversible ischemia or infarction. 2. Normal left ventricular wall motion. 3. Left ventricular ejection fraction 66% 4. Low-risk stress test findings*. *2012 Appropriate Use Criteria for Coronary Revascularization Focused Update: J Am Coll Cardiol. N6492421. http://content.airportbarriers.com.aspx?articleid=1201161 Electronically Signed   By: Julian Hy M.D.   On: 01/22/2016 13:29    Microbiology: No results found for this or any previous visit (from the past 240 hour(s)).   Labs: Basic Metabolic Panel:  Recent Labs Lab 01/21/16 1121  NA 136  K 4.9  CL 101  CO2 26  GLUCOSE 91  BUN 17  CREATININE 0.84  CALCIUM 9.6   Liver Function Tests: No results for input(s): AST, ALT, ALKPHOS, BILITOT, PROT, ALBUMIN in the last 168 hours. No results for input(s): LIPASE, AMYLASE in the last 168 hours. No results for input(s): AMMONIA in the last 168 hours. CBC:  Recent Labs Lab 01/21/16 1121  WBC 3.5*  HGB 12.5  HCT 36.1  MCV 91.6  PLT 218   Cardiac Enzymes:  Recent Labs Lab 01/21/16 1738 01/21/16 2054 01/21/16 2358  TROPONINI <0.03 <0.03 <0.03   BNP: BNP (last 3 results) No results for input(s): BNP in the last 8760 hours.  ProBNP (last 3 results) No results for input(s): PROBNP in the last 8760 hours.  CBG: No results for input(s): GLUCAP in the last 168 hours.   Discussed in detail with patient. Updated care and answered all questions.  Signed:  Vernell Leep, MD, FACP, FHM. [facility] Pager [phone]  If 7PM-7AM, please contact night-coverage [email or web address] Password [facility] 01/22/2016, 3:18 PM

## 2016-01-22 NOTE — Care Management Obs Status (Signed)
[place] NOTIFICATION   Patient Details  Name: Katrina Benitez MRN: [id number] Date of Birth: [birth year]   Medicare Observation Status Notification Given:  Yes    Dawayne Patricia, RN 01/22/2016, 3:04 PM

## 2016-01-22 NOTE — Progress Notes (Signed)
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  SUBJECTIVE: No more chest pain, no SOB  OBJECTIVE Filed Vitals:   01/21/16 1721 01/21/16 1844 01/21/16 2255 01/22/16 0549  BP: 144/77 154/71 140/67 122/56  Pulse: 77 65 64 63  Temp:  98.2 F (36.8 C) 98 F (36.7 C) 97.4 F (36.3 C)  TempSrc:  Oral Oral Oral  Resp: 16 18 18 18   SpO2: 99% 100% 100% 100%    Intake/Output Summary (Last 24 hours) at 01/22/16 1207 Last data filed at 01/22/16 0500  Gross per 24 hour  Intake    360 ml  Output      0 ml  Net    360 ml   There were no vitals filed for this visit.  PHYSICAL EXAM General: Well developed, well nourished, female in no acute distress. Head: Normocephalic, atraumatic.  Neck: Supple without bruits, JVD not elevated Lungs:  Resp regular and unlabored, CTA. Heart: RRR, S1, S2, no S3, S4, or murmur; no rub. Abdomen: Soft, non-tender, non-distended, BS + x 4.  Extremities: No clubbing, cyanosis, edema.  Neuro: Alert and oriented X 3. Moves all extremities spontaneously. Psych: Normal affect.  LABS: CBC: Recent Labs  01/21/16 1121  WBC 3.5*  HGB 12.5  HCT 36.1  MCV 91.6  PLT 99991111   Basic Metabolic Panel: Recent Labs  01/21/16 1121  NA 136  K 4.9  CL 101  CO2 26  GLUCOSE 91  BUN 17  CREATININE 0.84  CALCIUM 9.6   Cardiac Enzymes: Recent Labs  01/21/16 1738 01/21/16 2054 01/21/16 2358  TROPONINI <0.03 <0.03 <0.03    Recent Labs  01/21/16 1134 01/21/16 1503  TROPIPOC 0.00 0.00   D-dimer: Recent Labs  01/21/16 1121  DDIMER 0.46     TELE: SR, seen in nuc med     Radiology/Studies: Dg Chest 2 View  01/21/2016  CLINICAL DATA:  Intermittent chest pain for 1 month EXAM: CHEST  2 VIEW COMPARISON:  01/14/2016 FINDINGS: The heart size and vascular pattern are normal. There is no consolidation effusion or pneumothorax. There is a 2 cm nodular opacity over the right upper lobe. It is uncertain whether this is within the long or external to the patient. IMPRESSION: 2 cm rounded opacity over the right upper lobe. This may represent an object external to the patient such as an EKG lead. A pulmonary nodule is not excluded. Study is otherwise negative. Electronically Signed   By: Skipper Cliche M.D.   On: 01/21/2016 12:12     Current Medications:  . aspirin EC  325 mg Oral Daily  . DULoxetine  60 mg Oral Daily  . heparin  5,000 Units Subcutaneous Q8H  . metoprolol succinate  25 mg Oral Daily  . pantoprazole  40 mg Oral Daily  . regadenoson      . regadenoson  0.4 mg Intravenous Once  . sodium chloride  1 g Oral BID WC      ASSESSMENT AND PLAN: Principal Problem:   Chest pain - ez neg MI - no hx CAD - MV today to assess for ischemia -  no further workup if negative  Otherwise, per IM Active Problems:   SIADH (syndrome of inappropriate ADH production) ([place])   Hypertension   Anxiety and depression   GERD (gastroesophageal reflux disease)   Signed, Barrett, Rhonda , PA-C 12:07 PM 01/22/2016 As above. No chest pain or dyspnea. Patient ruled out. Await results of nuclear study. If negative patient can be discharged and follow-up with Dr. Burt Knack. Echocardiogram was ordered but if stress test normal could be performed as an outpatient. Needs follow-up chest x-ray as an outpatient. Kirk Ruths

## 2016-01-22 NOTE — Progress Notes (Signed)
Lexiscan MV performed, results pending. 1 day study, [place] to read.  Lenoard Aden 01/22/2016 12:29 PM Beeper [phone]

## 2016-02-08 ENCOUNTER — Other Ambulatory Visit: Payer: Self-pay | Admitting: Family

## 2016-02-08 DIAGNOSIS — R9389 Abnormal findings on diagnostic imaging of other specified body structures: Secondary | ICD-10-CM

## 2016-02-12 ENCOUNTER — Telehealth: Payer: Self-pay | Admitting: Cardiovascular Disease

## 2016-02-12 DIAGNOSIS — R079 Chest pain, unspecified: Secondary | ICD-10-CM

## 2016-02-12 NOTE — Telephone Encounter (Signed)
New Message  Pt wanted to speak w/ RN- was told that after d/c from hospital she would have outpatient ECHO- no order in syst. Please call back and discuss.

## 2016-02-12 NOTE — Telephone Encounter (Signed)
Reviewed hospital records and the pt is due for out patient echocardiogram.  Order placed and I have scheduled test for 1st available 02/26/16.  I left the pt a voicemail with this appointment and instructed her to contact the office and reschedule if needed.

## 2016-02-14 ENCOUNTER — Ambulatory Visit
Admission: RE | Admit: 2016-02-14 | Discharge: 2016-02-14 | Disposition: A | Discharge status: Home / Self Care | Payer: Medicare Other | Source: Ambulatory Visit | Attending: Family | Admitting: Family

## 2016-02-14 DIAGNOSIS — R918 Other nonspecific abnormal finding of lung field: Secondary | ICD-10-CM | POA: Diagnosis not present

## 2016-02-14 DIAGNOSIS — R9389 Abnormal findings on diagnostic imaging of other specified body structures: Secondary | ICD-10-CM

## 2016-02-21 DIAGNOSIS — R0789 Other chest pain: Secondary | ICD-10-CM | POA: Diagnosis not present

## 2016-02-21 DIAGNOSIS — G4700 Insomnia, unspecified: Secondary | ICD-10-CM | POA: Diagnosis not present

## 2016-02-21 DIAGNOSIS — E222 Syndrome of inappropriate secretion of antidiuretic hormone: Secondary | ICD-10-CM | POA: Diagnosis not present

## 2016-02-21 DIAGNOSIS — Z136 Encounter for screening for cardiovascular disorders: Secondary | ICD-10-CM | POA: Diagnosis not present

## 2016-02-21 DIAGNOSIS — Z9109 Other allergy status, other than to drugs and biological substances: Secondary | ICD-10-CM | POA: Diagnosis not present

## 2016-02-21 DIAGNOSIS — F329 Major depressive disorder, single episode, unspecified: Secondary | ICD-10-CM | POA: Diagnosis not present

## 2016-02-21 DIAGNOSIS — Z13220 Encounter for screening for lipoid disorders: Secondary | ICD-10-CM | POA: Diagnosis not present

## 2016-02-21 HISTORY — PX: CERVICAL POLYPECTOMY: SHX88

## 2016-02-26 ENCOUNTER — Ambulatory Visit (HOSPITAL_COMMUNITY): Payer: Medicare Other | Attending: Cardiology

## 2016-02-26 ENCOUNTER — Other Ambulatory Visit: Payer: Self-pay

## 2016-02-26 DIAGNOSIS — R079 Chest pain, unspecified: Secondary | ICD-10-CM | POA: Diagnosis not present

## 2016-02-26 DIAGNOSIS — I77810 Thoracic aortic ectasia: Secondary | ICD-10-CM | POA: Insufficient documentation

## 2016-02-26 DIAGNOSIS — I119 Hypertensive heart disease without heart failure: Secondary | ICD-10-CM | POA: Diagnosis not present

## 2016-02-26 DIAGNOSIS — I340 Nonrheumatic mitral (valve) insufficiency: Secondary | ICD-10-CM | POA: Diagnosis not present

## 2016-02-26 DIAGNOSIS — I351 Nonrheumatic aortic (valve) insufficiency: Secondary | ICD-10-CM | POA: Diagnosis not present

## 2016-02-26 DIAGNOSIS — I371 Nonrheumatic pulmonary valve insufficiency: Secondary | ICD-10-CM | POA: Diagnosis not present

## 2016-02-28 DIAGNOSIS — E875 Hyperkalemia: Secondary | ICD-10-CM | POA: Diagnosis not present

## 2016-02-29 ENCOUNTER — Telehealth: Payer: Self-pay | Admitting: Cardiovascular Disease

## 2016-02-29 NOTE — Telephone Encounter (Signed)
New message  Pt called for ECHO results

## 2016-02-29 NOTE — Telephone Encounter (Signed)
Notes Recorded by Sherren Mocha, MD on 02/29/2016 at 5:01 PM Study reviewed. The patient has moderate aortic insufficiency but has normal LV size and systolic function. 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 Recommend a follow-up echocardiogram in one year. She is asymptomatic with no heart failure symptoms. Findings and plan discussed with the patient on the telephone.

## 2016-03-09 NOTE — Progress Notes (Signed)
This encounter was created in error - please disregard.

## 2016-03-10 ENCOUNTER — Encounter: Payer: Medicare Other | Admitting: Cardiovascular Disease

## 2016-03-20 DIAGNOSIS — E7800 Pure hypercholesterolemia, unspecified: Secondary | ICD-10-CM | POA: Diagnosis not present

## 2016-03-20 DIAGNOSIS — Z79890 Hormone replacement therapy (postmenopausal): Secondary | ICD-10-CM | POA: Diagnosis not present

## 2016-03-20 DIAGNOSIS — E222 Syndrome of inappropriate secretion of antidiuretic hormone: Secondary | ICD-10-CM | POA: Diagnosis not present

## 2016-03-20 DIAGNOSIS — Z13820 Encounter for screening for osteoporosis: Secondary | ICD-10-CM | POA: Diagnosis not present

## 2016-03-20 DIAGNOSIS — E2839 Other primary ovarian failure: Secondary | ICD-10-CM | POA: Diagnosis not present

## 2016-03-20 DIAGNOSIS — Z01818 Encounter for other preprocedural examination: Secondary | ICD-10-CM | POA: Diagnosis not present

## 2016-03-20 DIAGNOSIS — M85862 Other specified disorders of bone density and structure, left lower leg: Secondary | ICD-10-CM | POA: Diagnosis not present

## 2016-03-20 DIAGNOSIS — D259 Leiomyoma of uterus, unspecified: Secondary | ICD-10-CM | POA: Diagnosis not present

## 2016-03-20 DIAGNOSIS — M8580 Other specified disorders of bone density and structure, unspecified site: Secondary | ICD-10-CM | POA: Diagnosis not present

## 2016-03-20 DIAGNOSIS — R938 Abnormal findings on diagnostic imaging of other specified body structures: Secondary | ICD-10-CM | POA: Diagnosis not present

## 2016-03-27 DIAGNOSIS — F419 Anxiety disorder, unspecified: Secondary | ICD-10-CM | POA: Diagnosis not present

## 2016-03-27 DIAGNOSIS — E785 Hyperlipidemia, unspecified: Secondary | ICD-10-CM | POA: Diagnosis not present

## 2016-03-27 DIAGNOSIS — Z79899 Other long term (current) drug therapy: Secondary | ICD-10-CM | POA: Diagnosis not present

## 2016-03-27 DIAGNOSIS — R112 Nausea with vomiting, unspecified: Secondary | ICD-10-CM | POA: Diagnosis not present

## 2016-03-27 DIAGNOSIS — N840 Polyp of corpus uteri: Secondary | ICD-10-CM | POA: Diagnosis not present

## 2016-03-27 DIAGNOSIS — K219 Gastro-esophageal reflux disease without esophagitis: Secondary | ICD-10-CM | POA: Diagnosis not present

## 2016-03-27 DIAGNOSIS — I10 Essential (primary) hypertension: Secondary | ICD-10-CM | POA: Diagnosis not present

## 2016-03-27 DIAGNOSIS — R938 Abnormal findings on diagnostic imaging of other specified body structures: Secondary | ICD-10-CM | POA: Diagnosis not present

## 2016-03-27 DIAGNOSIS — F329 Major depressive disorder, single episode, unspecified: Secondary | ICD-10-CM | POA: Diagnosis not present

## 2016-03-27 DIAGNOSIS — M549 Dorsalgia, unspecified: Secondary | ICD-10-CM | POA: Diagnosis not present

## 2016-03-27 DIAGNOSIS — G609 Hereditary and idiopathic neuropathy, unspecified: Secondary | ICD-10-CM | POA: Diagnosis not present

## 2016-03-27 DIAGNOSIS — R197 Diarrhea, unspecified: Secondary | ICD-10-CM | POA: Diagnosis not present

## 2016-03-27 DIAGNOSIS — E7800 Pure hypercholesterolemia, unspecified: Secondary | ICD-10-CM | POA: Insufficient documentation

## 2016-03-27 DIAGNOSIS — N841 Polyp of cervix uteri: Secondary | ICD-10-CM | POA: Diagnosis not present

## 2016-04-14 DIAGNOSIS — L292 Pruritus vulvae: Secondary | ICD-10-CM | POA: Diagnosis not present

## 2016-04-14 DIAGNOSIS — Z9889 Other specified postprocedural states: Secondary | ICD-10-CM | POA: Diagnosis not present

## 2016-04-16 DIAGNOSIS — N94819 Vulvodynia, unspecified: Secondary | ICD-10-CM | POA: Insufficient documentation

## 2016-04-16 DIAGNOSIS — L292 Pruritus vulvae: Secondary | ICD-10-CM | POA: Insufficient documentation

## 2016-05-08 DIAGNOSIS — E222 Syndrome of inappropriate secretion of antidiuretic hormone: Secondary | ICD-10-CM | POA: Diagnosis not present

## 2016-05-28 ENCOUNTER — Telehealth: Payer: Self-pay | Admitting: Internal Medicine

## 2016-05-28 NOTE — Telephone Encounter (Signed)
Put her on a cancellation list. Watch my schedule closely and when an opening arises you can work her in. Otherwise, she could be seen by a PA in the office on a day when I can supervise.

## 2016-05-28 NOTE — Telephone Encounter (Signed)
Pt scheduled to see Alonza Bogus PA 06/02/16@3 :45pm. Pt aware and knows she is on the cancellation list.

## 2016-05-28 NOTE — Telephone Encounter (Signed)
Pt states she is a retired Engineer, drilling and she does not want to see a PA, she wants to see Dr. Henrene Pastor. States she has started to have some issues with fecal incontinence and it seems to be getting worse.Reports she has had some back issues int he past but is not having any now. States she is supposed to go out of town but will not go until she can be seen. Please advise where to schedule pt.

## 2016-06-02 ENCOUNTER — Encounter: Payer: Self-pay | Admitting: Internal Medicine

## 2016-06-02 ENCOUNTER — Other Ambulatory Visit (INDEPENDENT_AMBULATORY_CARE_PROVIDER_SITE_OTHER): Payer: Medicare Other

## 2016-06-02 ENCOUNTER — Ambulatory Visit: Payer: Medicare Other | Admitting: Gastroenterology

## 2016-06-02 ENCOUNTER — Ambulatory Visit (INDEPENDENT_AMBULATORY_CARE_PROVIDER_SITE_OTHER): Payer: Medicare Other | Admitting: Internal Medicine

## 2016-06-02 VITALS — BP 120/74 | HR 64 | Ht 65.5 in | Wt 174.2 lb

## 2016-06-02 DIAGNOSIS — R7989 Other specified abnormal findings of blood chemistry: Secondary | ICD-10-CM

## 2016-06-02 DIAGNOSIS — K5901 Slow transit constipation: Secondary | ICD-10-CM

## 2016-06-02 DIAGNOSIS — R945 Abnormal results of liver function studies: Principal | ICD-10-CM

## 2016-06-02 DIAGNOSIS — K219 Gastro-esophageal reflux disease without esophagitis: Secondary | ICD-10-CM | POA: Diagnosis not present

## 2016-06-02 DIAGNOSIS — R159 Full incontinence of feces: Secondary | ICD-10-CM | POA: Diagnosis not present

## 2016-06-02 DIAGNOSIS — K222 Esophageal obstruction: Secondary | ICD-10-CM | POA: Diagnosis not present

## 2016-06-02 LAB — HEPATITIS C ANTIBODY: HCV AB: NEGATIVE

## 2016-06-02 LAB — HEPATIC FUNCTION PANEL
ALT: 20 U/L (ref 0–35)
AST: 26 U/L (ref 0–37)
Albumin: 4.2 g/dL (ref 3.5–5.2)
Alkaline Phosphatase: 111 U/L (ref 39–117)
Bilirubin, Direct: 0.1 mg/dL (ref 0.0–0.3)
TOTAL PROTEIN: 6.9 g/dL (ref 6.0–8.3)
Total Bilirubin: 0.5 mg/dL (ref 0.2–1.2)

## 2016-06-02 NOTE — Patient Instructions (Signed)
Your physician has requested that you go to the basement for the lab work before leaving today   Fiber, as discussed with Dr. Henrene Pastor

## 2016-06-02 NOTE — Progress Notes (Signed)
HISTORY OF PRESENT ILLNESS:  Katrina Benitez is a 70 y.o. female , retired Surveyor, quantity, who was initially evaluated 10/03/2015 for chronic GERD, intermittent solid food dysphagia, chronic abdominal bloating with gas, right upper quadrant pain, and to establish. She was advised with regards to reflux precautions, continued on PPI with proper adjustment in dosage timing, negative celiac testing, instruction which regards to intestinal gas, abdominal ultrasound which was unremarkable, and upper endoscopy 10/04/2015. She was found to have a benign ringlike stricture which was dilated to [place]. She was to follow-up in 6 weeks, but did not. Since that time she was in the hospital for noncardiac chest pain and also has undergone gynecologic procedures. She contacted the office recently requesting to be seen urgently for one episode of fecal incontinence after taking a laxative for constipation (fecal staining of undergarments). During the course of today's interview she discusses multiple non-GI complaints ranging from mood disorder to pelvic pain. She also requested testing for hepatitis C. I did review outside liver tests from March. She did have slightly elevated alkaline phosphatase but otherwise normal LFTs. She is currently taking Nexium 20 mg daily for her GERD. Denying active GERD symptoms. Her bowel sent to be on the constipated side. This is unchanged. She has used Colace in the past with success. Patient tells me that she weaned herself off Cymbalta and is seeing a psychologist.  REVIEW OF SYSTEMS:  All non-GI ROS negative except for back pain  Past Medical History:  Diagnosis Date  . Allergy   . Anemia   . Anxiety   . Arthritis    djd  . Cardiac arrhythmia   . Depression   . Diverticulitis   . Diverticulosis   . GERD (gastroesophageal reflux disease)   . Heart murmur   . HTN (hypertension)   . Neuromuscular disorder ([place])    idiopathic peripheral  neuropathy  .  Peripheral neuropathy ([place])   . SIADH (syndrome of inappropriate ADH production) ([place])     Past Surgical History:  Procedure Laterality Date  . BREAST BIOPSY Left   . CERVICAL POLYPECTOMY  02/2016  . COLONOSCOPY    . LUMBAR DISC SURGERY    . LUMBAR LAMINECTOMY    . TONSILLECTOMY    . UPPER GASTROINTESTINAL ENDOSCOPY      Social History Katrina Benitez  reports that she has never smoked. She has never used smokeless tobacco. She reports that she drinks about 4.2 oz of alcohol per week . She reports that she does not use drugs.  family history includes Breast cancer in her mother; Glaucoma in her mother; Heart attack in her father; Prostate cancer in her paternal grandfather; Tuberculosis in her paternal grandmother.  Allergies  Allergen Reactions  . Ciprofloxacin Hives  . Dilaudid [Hydromorphone Hcl] Hives  . Flagyl [Metronidazole] Hives  . Visipaque [Iodixanol] Swelling    Throat swelling       PHYSICAL EXAMINATION: Vital signs: BP 120/74 (BP Location: Left Arm, Patient Position: Sitting, Cuff Size: Normal)   Pulse 64   Ht 5' 5.5" (1.664 m)   Wt 174 lb 3.2 oz (79 kg)   BMI 28.55 kg/m   Constitutional: generally well-appearing, no acute distress Psychiatric: Somewhat anxious, alert and oriented x3, cooperative Eyes: extraocular movements intact, anicteric, conjunctiva pink Mouth: oral pharynx moist, no lesions Neck: supple no lymphadenopathy Cardiovascular: heart regular rate and rhythm, no murmur Lungs: clear to auscultation bilaterally Abdomen: soft, nontender, nondistended, no obvious ascites, no peritoneal signs, normal bowel  sounds, no organomegaly Rectal: No external abnormalities. No tenderness or mass. Sensation intact. Normal squeeze. Hemoccult negative stool Extremities: no clubbing cyanosis or lower extremity edema bilaterally Skin: no lesions on visible extremities Neuro: No focal deficits. Cranial nerves intact  ASSESSMENT:  #1. Mild episode of  incontinence after taking a laxative. Nothing problematic #2. GERD with esophageal stricture. Asymptomatic post dilation on PPI #3. Patient reports multiple prior negative colonoscopies, most recent 2014. #4. Myriad non-GI complaints #5. Mild elevation of alkaline phosphatase. Normal abdominal ultrasound or liver ultrasound/ biliary ultrasound that time #6. Request hepatitis C testing   PLAN:  #1. Daily fiber supplementation with Metamucil #2. Okay to use Colace #3. Advised laxatives may result in incontinence #4. Reflux precautions #5. Continue PPI #6. Repeat liver tests. Check hepatitis C antibody and AMA. We will contact the patient with the results #7. Routine GI follow-up one year. Sooner if needed #8. Return to the care of your primary provider and other specialists regarding multiple non-GI issues

## 2016-06-04 LAB — MITOCHONDRIAL ANTIBODIES

## 2016-06-08 DIAGNOSIS — F418 Other specified anxiety disorders: Secondary | ICD-10-CM | POA: Diagnosis not present

## 2016-06-08 DIAGNOSIS — M940 Chondrocostal junction syndrome [Tietze]: Secondary | ICD-10-CM | POA: Diagnosis not present

## 2016-07-11 ENCOUNTER — Other Ambulatory Visit: Payer: Self-pay | Admitting: Family

## 2016-07-11 DIAGNOSIS — E222 Syndrome of inappropriate secretion of antidiuretic hormone: Secondary | ICD-10-CM | POA: Diagnosis not present

## 2016-07-11 DIAGNOSIS — Z1231 Encounter for screening mammogram for malignant neoplasm of breast: Secondary | ICD-10-CM

## 2016-07-20 DIAGNOSIS — M79674 Pain in right toe(s): Secondary | ICD-10-CM | POA: Diagnosis not present

## 2016-07-20 DIAGNOSIS — F418 Other specified anxiety disorders: Secondary | ICD-10-CM | POA: Diagnosis not present

## 2016-07-20 DIAGNOSIS — M545 Low back pain: Secondary | ICD-10-CM | POA: Diagnosis not present

## 2016-07-20 DIAGNOSIS — S90211A Contusion of right great toe with damage to nail, initial encounter: Secondary | ICD-10-CM | POA: Diagnosis not present

## 2016-07-29 ENCOUNTER — Other Ambulatory Visit: Payer: Self-pay | Admitting: Family

## 2016-07-29 DIAGNOSIS — R9389 Abnormal findings on diagnostic imaging of other specified body structures: Secondary | ICD-10-CM

## 2016-07-29 DIAGNOSIS — E222 Syndrome of inappropriate secretion of antidiuretic hormone: Secondary | ICD-10-CM | POA: Diagnosis not present

## 2016-07-30 ENCOUNTER — Ambulatory Visit
Admission: RE | Admit: 2016-07-30 | Discharge: 2016-07-30 | Disposition: A | Discharge status: Home / Self Care | Payer: Medicare Other | Source: Ambulatory Visit | Attending: Family | Admitting: Family

## 2016-07-30 DIAGNOSIS — Z1231 Encounter for screening mammogram for malignant neoplasm of breast: Secondary | ICD-10-CM

## 2016-08-01 ENCOUNTER — Ambulatory Visit
Admission: RE | Admit: 2016-08-01 | Discharge: 2016-08-01 | Disposition: A | Discharge status: Home / Self Care | Payer: Medicare Other | Source: Ambulatory Visit | Attending: Family | Admitting: Family

## 2016-08-01 DIAGNOSIS — R079 Chest pain, unspecified: Secondary | ICD-10-CM | POA: Diagnosis not present

## 2016-08-01 DIAGNOSIS — R9389 Abnormal findings on diagnostic imaging of other specified body structures: Secondary | ICD-10-CM

## 2016-09-26 DIAGNOSIS — M25552 Pain in left hip: Secondary | ICD-10-CM | POA: Diagnosis not present

## 2016-09-26 DIAGNOSIS — R0789 Other chest pain: Secondary | ICD-10-CM | POA: Diagnosis not present

## 2016-09-26 DIAGNOSIS — J209 Acute bronchitis, unspecified: Secondary | ICD-10-CM | POA: Diagnosis not present

## 2016-09-26 DIAGNOSIS — E222 Syndrome of inappropriate secretion of antidiuretic hormone: Secondary | ICD-10-CM | POA: Diagnosis not present

## 2016-09-26 DIAGNOSIS — J019 Acute sinusitis, unspecified: Secondary | ICD-10-CM | POA: Diagnosis not present

## 2016-09-26 DIAGNOSIS — F419 Anxiety disorder, unspecified: Secondary | ICD-10-CM | POA: Diagnosis not present

## 2016-09-26 DIAGNOSIS — B9689 Other specified bacterial agents as the cause of diseases classified elsewhere: Secondary | ICD-10-CM | POA: Diagnosis not present

## 2016-10-02 ENCOUNTER — Other Ambulatory Visit: Payer: Self-pay | Admitting: Family

## 2016-10-02 DIAGNOSIS — M25552 Pain in left hip: Secondary | ICD-10-CM

## 2016-10-08 IMAGING — CT CT CHEST W/O CM
1 series · 15 of 34 positions shown, 19 images · non-contrast
Comparison: None.

CLINICAL DATA: Nodular opacity seen within the right upper lobe on
chest x-ray of 01/22/2016.

EXAM:
CT CHEST WITHOUT CONTRAST
TECHNIQUE: Multidetector CT imaging of the chest was performed following the
standard protocol without IV contrast.

[Series 2: chest w/(date) · axial · 0.70mm/px · z∈[-346,-76]mm · 15 of 159 slices shown, 19 images]
[im 12/159  mediastinal]
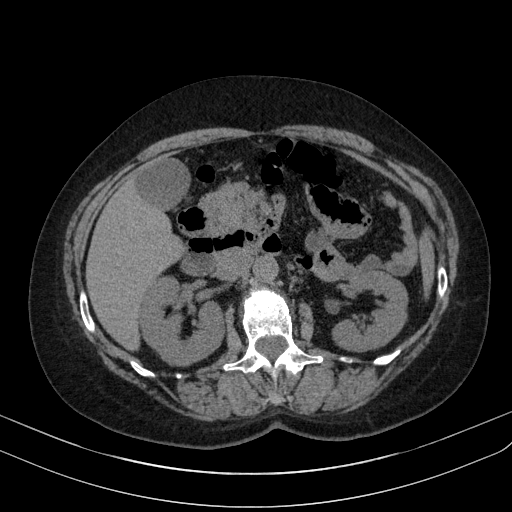
[im 12/159  lung]
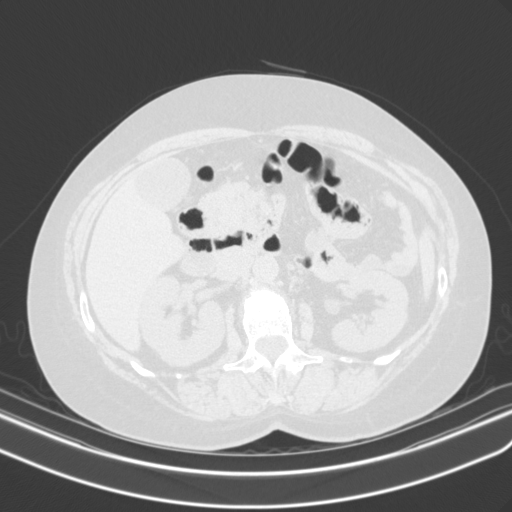
[im 24/159  lung]
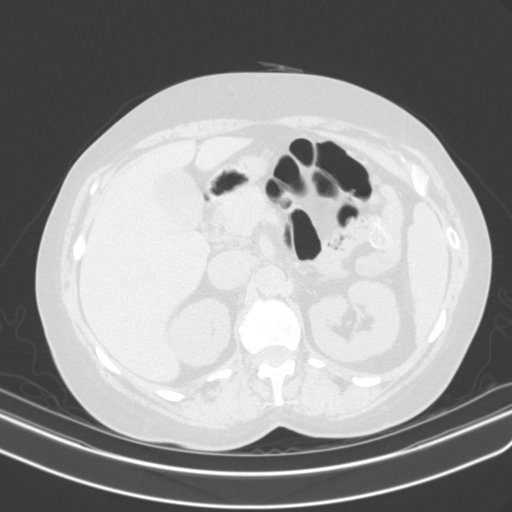
[im 32/159  lung]
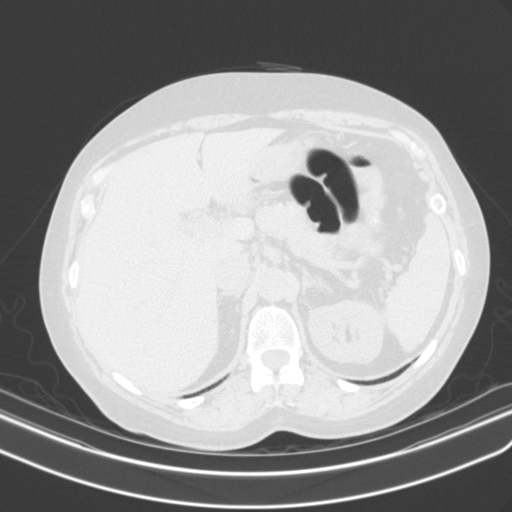
[im 41/159  lung]
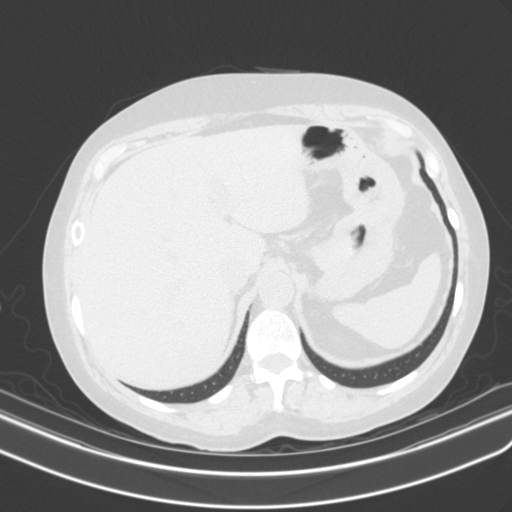
[im 53/159  mediastinal]
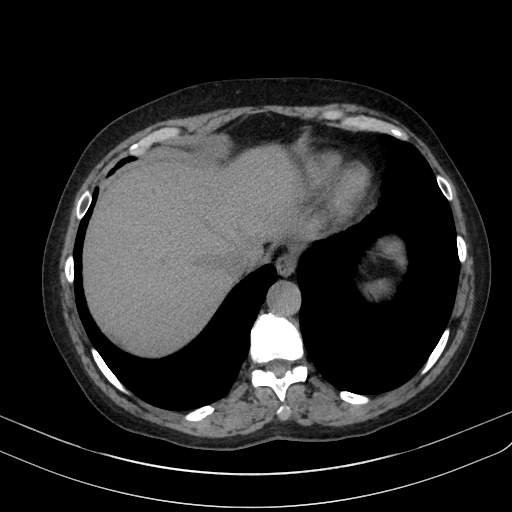
[im 53/159  lung]
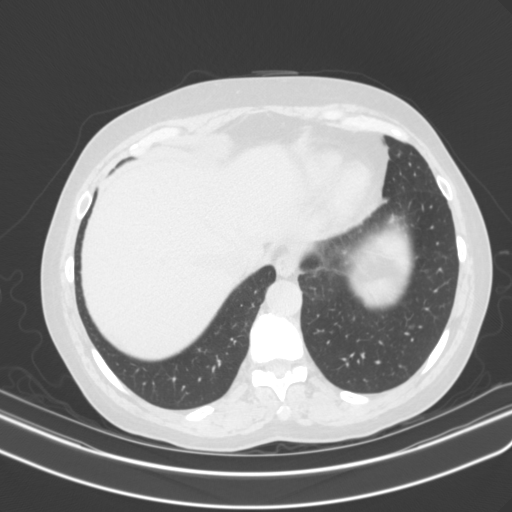
[im 64/159  lung]
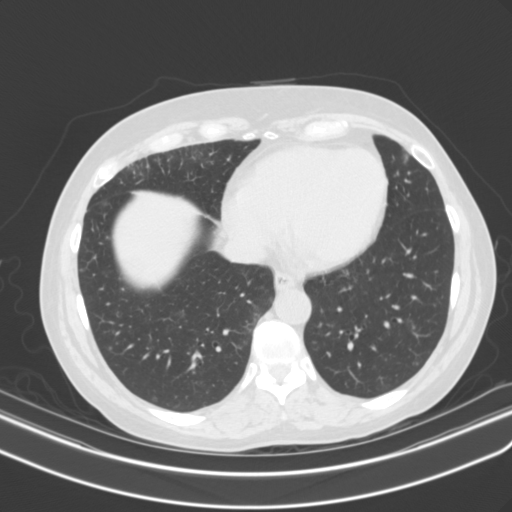
[im 71/159  lung]
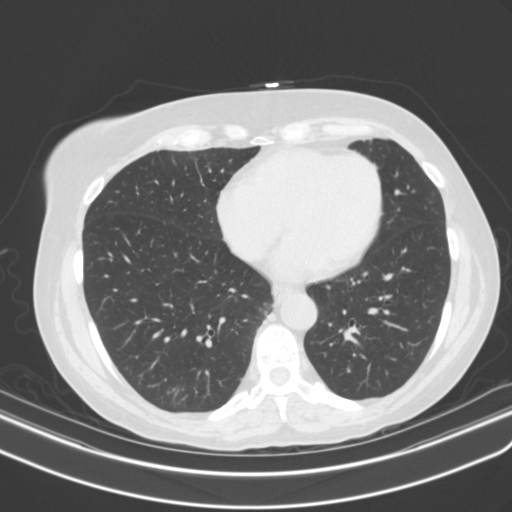
[im 82/159  lung]
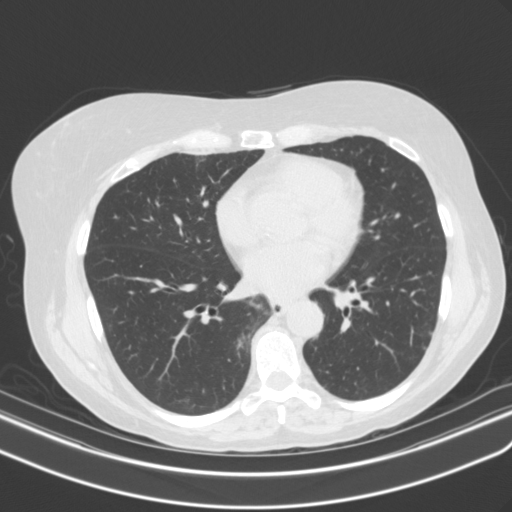
[im 88/159  mediastinal]
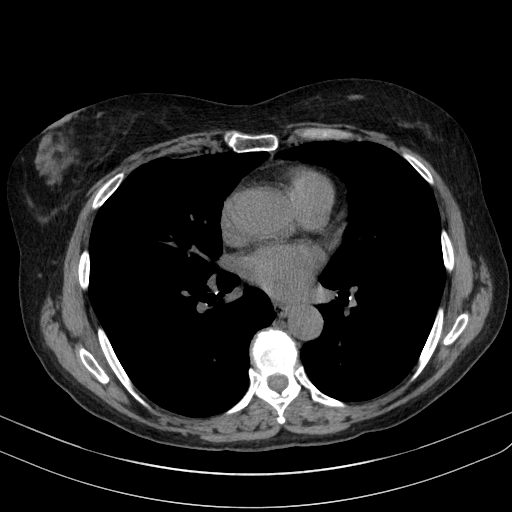
[im 88/159  lung]
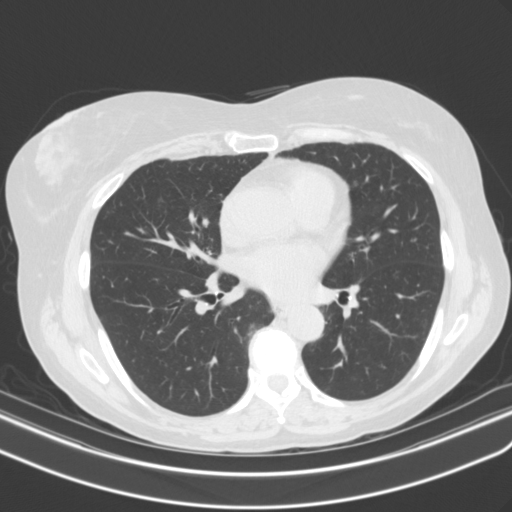
[im 95/159  lung]
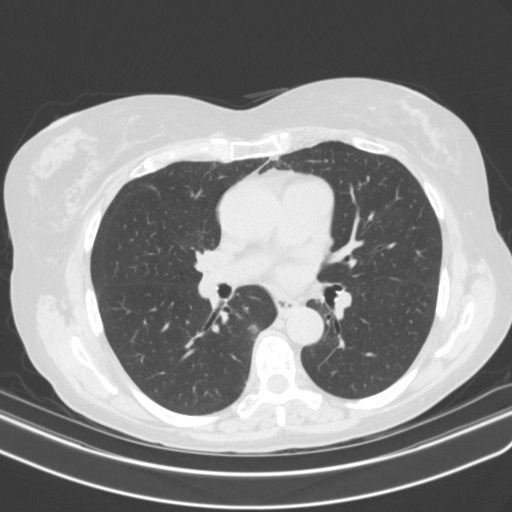
[im 106/159  lung]
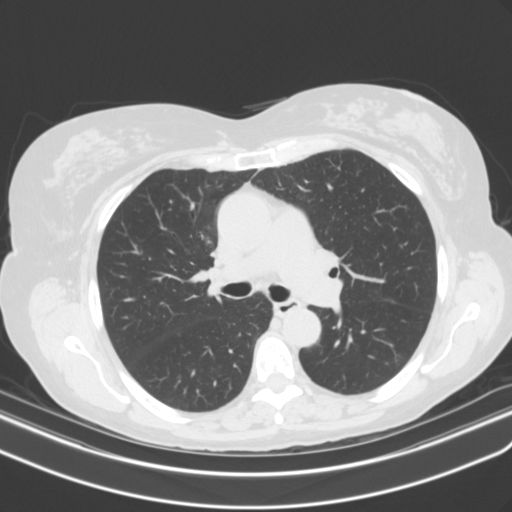
[im 118/159  lung]
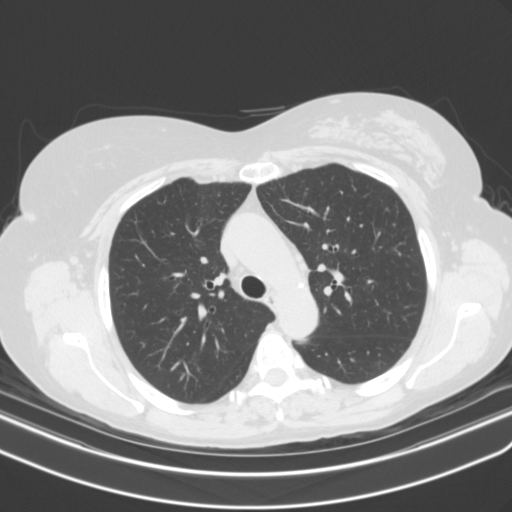
[im 127/159  mediastinal]
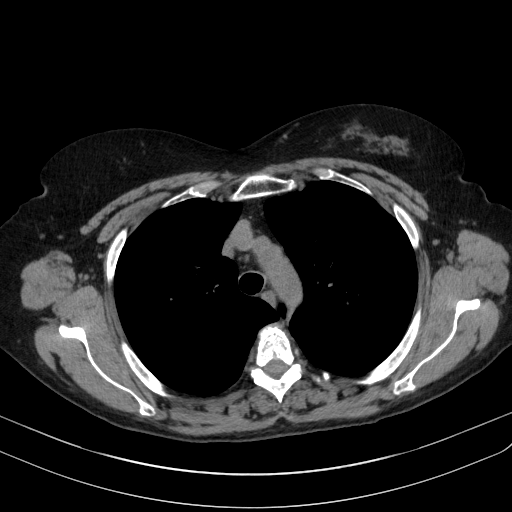
[im 127/159  lung]
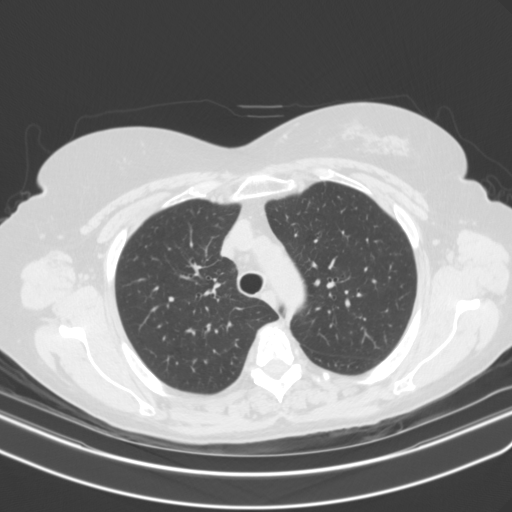
[im 135/159  lung]
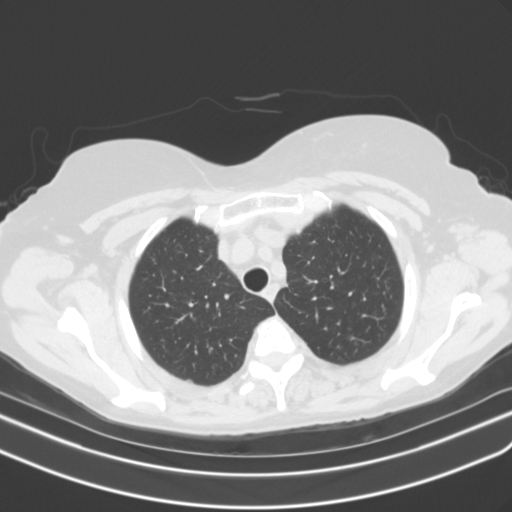
[im 147/159  lung]
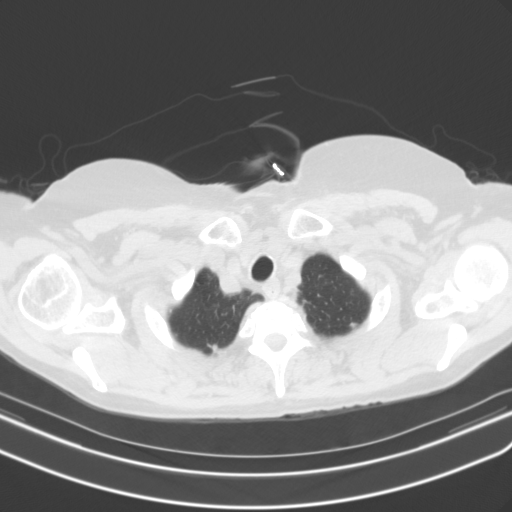

[15 of 34 positions shown; findings below may reference images not displayed]

FINDINGS: Mediastinum/Lymph Nodes: Heart size is normal. No pericardial
effusion. No mass or enlarged lymph nodes seen within the
mediastinum or perihilar regions. Thoracic aorta is normal in
caliber.

Lungs/Pleura: There is mild scarring/fibrosis at each lung apex.
Upper lobes are otherwise clear bilaterally.

There are scattered punctate nodular densities within the
peripheries of the lower lobes bilaterally, too small to
definitively characterize. No dominant nodule or mass.

Small nodular consolidation within the medial aspects of the right
lower lobe, measuring 9 mm greatest dimension, is most likely
chronic scarring or atelectasis (series 5, image 66).

Upper abdomen: Limited images of the upper abdomen are unremarkable.

Musculoskeletal: Mild degenerative spurring within the thoracic
spine. Additional degenerative change at the L1-2 disc space, at
least moderate in degree. No acute-appearing osseous abnormality.
Superficial soft tissues are unremarkable.
IMPRESSION: 1. Mild biapical scarring/fibrosis, likely corresponding to the
questioned right upper lobe nodule on recent chest x-ray. No
suspicious findings are seen within the upper lobes on today's chest
CT.
2. Scattered punctate nodular densities within the lower lobes
bilaterally, with peripheral distribution, too small to definitively
characterize, likely sequela of previous infectious or inflammatory
process. Non-contrast chest CT at 3-6 months is recommended. If
nodules persist, subsequent management will be based upon the most
suspicious nodule(s). This recommendation follows the consensus
statement: Guidelines for Management of Incidental Pulmonary Nodules
Detected on CT Images:From the [HOSPITAL] 6148; published
online before print (10.1148/radiol.7671707042).
3. No acute findings.

## 2016-10-17 ENCOUNTER — Inpatient Hospital Stay
Admission: RE | Admit: 2016-10-17 | Discharge: 2016-10-17 | Disposition: A | Discharge status: Home / Self Care | Payer: Medicare Other | Source: Ambulatory Visit | Attending: Family | Admitting: Family

## 2016-10-22 ENCOUNTER — Other Ambulatory Visit: Payer: Self-pay | Admitting: Family

## 2016-10-22 DIAGNOSIS — M25552 Pain in left hip: Secondary | ICD-10-CM

## 2016-10-27 ENCOUNTER — Inpatient Hospital Stay
Admission: RE | Admit: 2016-10-27 | Discharge: 2016-10-27 | Disposition: A | Discharge status: Home / Self Care | Payer: Medicare Other | Source: Ambulatory Visit | Attending: Family | Admitting: Family

## 2016-10-28 DIAGNOSIS — E222 Syndrome of inappropriate secretion of antidiuretic hormone: Secondary | ICD-10-CM | POA: Diagnosis not present

## 2016-10-28 DIAGNOSIS — F418 Other specified anxiety disorders: Secondary | ICD-10-CM | POA: Diagnosis not present

## 2016-10-28 DIAGNOSIS — G4700 Insomnia, unspecified: Secondary | ICD-10-CM | POA: Diagnosis not present

## 2016-11-03 ENCOUNTER — Ambulatory Visit
Admission: RE | Admit: 2016-11-03 | Discharge: 2016-11-03 | Disposition: A | Discharge status: Home / Self Care | Payer: Medicare Other | Source: Ambulatory Visit | Attending: Family | Admitting: Family

## 2016-11-03 DIAGNOSIS — M5126 Other intervertebral disc displacement, lumbar region: Secondary | ICD-10-CM | POA: Diagnosis not present

## 2016-11-03 DIAGNOSIS — M25552 Pain in left hip: Secondary | ICD-10-CM

## 2016-11-03 MED ORDER — GADOBENATE DIMEGLUMINE 529 MG/ML IV SOLN
15.0000 mL | Freq: Once | INTRAVENOUS | Status: AC | PRN
  Administered 2016-11-03: 15 mL via INTRAVENOUS

## 2016-11-11 DIAGNOSIS — M5136 Other intervertebral disc degeneration, lumbar region: Secondary | ICD-10-CM | POA: Diagnosis not present

## 2016-11-11 DIAGNOSIS — M48062 Spinal stenosis, lumbar region with neurogenic claudication: Secondary | ICD-10-CM | POA: Diagnosis not present

## 2016-11-11 DIAGNOSIS — M549 Dorsalgia, unspecified: Secondary | ICD-10-CM | POA: Diagnosis not present

## 2016-11-11 DIAGNOSIS — M4316 Spondylolisthesis, lumbar region: Secondary | ICD-10-CM | POA: Diagnosis not present

## 2016-11-11 DIAGNOSIS — M546 Pain in thoracic spine: Secondary | ICD-10-CM | POA: Diagnosis not present

## 2016-11-11 DIAGNOSIS — M4726 Other spondylosis with radiculopathy, lumbar region: Secondary | ICD-10-CM | POA: Diagnosis not present

## 2016-11-11 DIAGNOSIS — M5416 Radiculopathy, lumbar region: Secondary | ICD-10-CM | POA: Diagnosis not present

## 2016-12-16 DIAGNOSIS — Z6829 Body mass index (BMI) 29.0-29.9, adult: Secondary | ICD-10-CM | POA: Diagnosis not present

## 2016-12-16 DIAGNOSIS — M5416 Radiculopathy, lumbar region: Secondary | ICD-10-CM | POA: Diagnosis not present

## 2016-12-16 DIAGNOSIS — M25551 Pain in right hip: Secondary | ICD-10-CM | POA: Diagnosis not present

## 2016-12-16 DIAGNOSIS — M5136 Other intervertebral disc degeneration, lumbar region: Secondary | ICD-10-CM | POA: Diagnosis not present

## 2016-12-16 DIAGNOSIS — M4726 Other spondylosis with radiculopathy, lumbar region: Secondary | ICD-10-CM | POA: Diagnosis not present

## 2016-12-16 DIAGNOSIS — M4316 Spondylolisthesis, lumbar region: Secondary | ICD-10-CM | POA: Diagnosis not present

## 2016-12-16 DIAGNOSIS — M48062 Spinal stenosis, lumbar region with neurogenic claudication: Secondary | ICD-10-CM | POA: Diagnosis not present

## 2016-12-16 DIAGNOSIS — R030 Elevated blood-pressure reading, without diagnosis of hypertension: Secondary | ICD-10-CM | POA: Diagnosis not present

## 2017-01-26 DIAGNOSIS — M5416 Radiculopathy, lumbar region: Secondary | ICD-10-CM | POA: Diagnosis not present

## 2017-01-26 DIAGNOSIS — M5136 Other intervertebral disc degeneration, lumbar region: Secondary | ICD-10-CM | POA: Diagnosis not present

## 2017-01-26 DIAGNOSIS — M4316 Spondylolisthesis, lumbar region: Secondary | ICD-10-CM | POA: Diagnosis not present

## 2017-01-26 DIAGNOSIS — M4726 Other spondylosis with radiculopathy, lumbar region: Secondary | ICD-10-CM | POA: Diagnosis not present

## 2017-01-26 DIAGNOSIS — M48062 Spinal stenosis, lumbar region with neurogenic claudication: Secondary | ICD-10-CM | POA: Diagnosis not present

## 2017-01-29 DIAGNOSIS — E222 Syndrome of inappropriate secretion of antidiuretic hormone: Secondary | ICD-10-CM | POA: Diagnosis not present

## 2017-02-27 DIAGNOSIS — M5416 Radiculopathy, lumbar region: Secondary | ICD-10-CM | POA: Diagnosis not present

## 2017-02-27 DIAGNOSIS — M4316 Spondylolisthesis, lumbar region: Secondary | ICD-10-CM | POA: Diagnosis not present

## 2017-02-27 DIAGNOSIS — I10 Essential (primary) hypertension: Secondary | ICD-10-CM | POA: Diagnosis not present

## 2017-02-27 DIAGNOSIS — M5136 Other intervertebral disc degeneration, lumbar region: Secondary | ICD-10-CM | POA: Diagnosis not present

## 2017-02-27 DIAGNOSIS — M4726 Other spondylosis with radiculopathy, lumbar region: Secondary | ICD-10-CM | POA: Diagnosis not present

## 2017-02-27 DIAGNOSIS — Z6830 Body mass index (BMI) 30.0-30.9, adult: Secondary | ICD-10-CM | POA: Diagnosis not present

## 2017-02-27 DIAGNOSIS — M48062 Spinal stenosis, lumbar region with neurogenic claudication: Secondary | ICD-10-CM | POA: Diagnosis not present

## 2017-03-24 DIAGNOSIS — E222 Syndrome of inappropriate secretion of antidiuretic hormone: Secondary | ICD-10-CM | POA: Diagnosis not present

## 2017-04-26 DIAGNOSIS — M79672 Pain in left foot: Secondary | ICD-10-CM | POA: Diagnosis not present

## 2017-04-26 DIAGNOSIS — R079 Chest pain, unspecified: Secondary | ICD-10-CM | POA: Diagnosis not present

## 2017-04-26 DIAGNOSIS — M7989 Other specified soft tissue disorders: Secondary | ICD-10-CM | POA: Diagnosis not present

## 2017-04-26 DIAGNOSIS — B0229 Other postherpetic nervous system involvement: Secondary | ICD-10-CM | POA: Diagnosis not present

## 2017-04-26 DIAGNOSIS — R0789 Other chest pain: Secondary | ICD-10-CM | POA: Diagnosis not present

## 2017-04-28 ENCOUNTER — Telehealth: Payer: Self-pay

## 2017-04-28 DIAGNOSIS — I77810 Thoracic aortic ectasia: Secondary | ICD-10-CM

## 2017-04-28 DIAGNOSIS — I351 Nonrheumatic aortic (valve) insufficiency: Secondary | ICD-10-CM

## 2017-04-28 NOTE — Telephone Encounter (Signed)
I contacted the pt to discuss scheduling her 1 YEAR follow-up echocardiogram.  The pt is currently on vacation but did get a reminder letter about making this appointment.  The states that she will call the office back at another time to arrange echocardiogram. Order has been placed in the system and will await return call from pt.

## 2017-05-19 ENCOUNTER — Other Ambulatory Visit: Payer: Self-pay

## 2017-05-19 ENCOUNTER — Ambulatory Visit (HOSPITAL_COMMUNITY): Payer: Medicare Other | Attending: Cardiology

## 2017-05-19 DIAGNOSIS — I10 Essential (primary) hypertension: Secondary | ICD-10-CM | POA: Diagnosis not present

## 2017-05-19 DIAGNOSIS — I351 Nonrheumatic aortic (valve) insufficiency: Secondary | ICD-10-CM

## 2017-05-19 DIAGNOSIS — Z8249 Family history of ischemic heart disease and other diseases of the circulatory system: Secondary | ICD-10-CM | POA: Diagnosis not present

## 2017-05-19 DIAGNOSIS — I080 Rheumatic disorders of both mitral and aortic valves: Secondary | ICD-10-CM | POA: Diagnosis not present

## 2017-05-19 DIAGNOSIS — I77810 Thoracic aortic ectasia: Secondary | ICD-10-CM

## 2017-05-26 ENCOUNTER — Telehealth: Payer: Self-pay | Admitting: Cardiovascular Disease

## 2017-05-26 NOTE — Telephone Encounter (Signed)
Left message to call back  

## 2017-05-26 NOTE — Telephone Encounter (Signed)
I spoke with pt. She reports she is feeling fine.  Will plan on Spring 2019 follow up with Dr. Burt Knack.

## 2017-05-26 NOTE — Telephone Encounter (Signed)
New message    Pt is calling back for Pat about echo results.

## 2017-06-25 ENCOUNTER — Ambulatory Visit: Payer: Medicare Other | Admitting: Neurology

## 2017-06-26 ENCOUNTER — Encounter: Payer: Self-pay | Admitting: Neurology

## 2017-06-26 ENCOUNTER — Ambulatory Visit (INDEPENDENT_AMBULATORY_CARE_PROVIDER_SITE_OTHER): Payer: Medicare Other | Admitting: Neurology

## 2017-06-26 VITALS — BP 159/83 | HR 71 | Ht 65.5 in

## 2017-06-26 DIAGNOSIS — R269 Unspecified abnormalities of gait and mobility: Secondary | ICD-10-CM | POA: Diagnosis not present

## 2017-06-26 DIAGNOSIS — R072 Precordial pain: Secondary | ICD-10-CM

## 2017-06-26 DIAGNOSIS — Z5181 Encounter for therapeutic drug level monitoring: Secondary | ICD-10-CM

## 2017-06-26 MED ORDER — METOPROLOL SUCCINATE ER 25 MG PO TB24: 25.0000 mg | ORAL_TABLET | Freq: Every day | ORAL | 3 refills | Status: DC

## 2017-06-26 MED ORDER — CITALOPRAM HYDROBROMIDE 20 MG PO TABS: 20.0000 mg | ORAL_TABLET | Freq: Every day | ORAL | 3 refills | Status: DC

## 2017-06-26 MED ORDER — GABAPENTIN 600 MG PO TABS: 600.0000 mg | ORAL_TABLET | Freq: Three times a day (TID) | ORAL | 1 refills | Status: DC

## 2017-06-26 MED ORDER — DIPHENOXYLATE-ATROPINE 2.5-0.025 MG PO TABS: 1.0000 | ORAL_TABLET | Freq: Four times a day (QID) | ORAL | 3 refills | Status: DC | PRN

## 2017-06-26 MED ORDER — ONDANSETRON HCL 4 MG PO TABS: 4.0000 mg | ORAL_TABLET | Freq: Three times a day (TID) | ORAL | 1 refills | Status: DC | PRN

## 2017-06-26 MED ORDER — ZOLPIDEM TARTRATE 10 MG PO TABS: 10.0000 mg | ORAL_TABLET | Freq: Every evening | ORAL | 2 refills | Status: DC | PRN

## 2017-06-26 NOTE — Progress Notes (Signed)
Reason for visit: Left chest pain  Katrina Benitez is an 71 y.o. female  History of present illness:  Katrina Benitez is a 71 year old right-handed white female with a history of left C4 distribution shingles that occurred 8 years ago. The patient has had some discomfort in the upper chest and back since that time, and within the last 2 years she developed a sharp pain that was more deep inside the chest. She has undergone a cardiac workup that was unremarkable. The patient indicates that she may note some chest pain with taking a deep breath. The patient feels worse when she is lying on her left side in bed. She also has a history of low back pain, she has seen Dr. Sherwood Gambler for this. She was placed on low-dose gabapentin which helped her low back pain, and eventually when the gabapentin was increased to 600 mg 3 times daily the left-sided chest pain has improved. It was felt that the left-sided chest pain could potentially be a form of postherpetic neuralgia. The patient denies any significant neck pain per se. She does have a history of a peripheral neuropathy, she has some gait instability associated with this. She has fallen on occasion. She returns to the office today for an evaluation.  Past Medical History:  Diagnosis Date  . Allergy   . Anemia   . Anxiety   . Arthritis    djd  . Cardiac arrhythmia   . Depression   . Diverticulitis   . Diverticulosis   . GERD (gastroesophageal reflux disease)   . Heart murmur   . HTN (hypertension)   . Neuromuscular disorder ([place])    idiopathic peripheral  neuropathy  . Peripheral neuropathy   . SIADH (syndrome of inappropriate ADH production) ([place])     Past Surgical History:  Procedure Laterality Date  . BREAST BIOPSY Left   . CERVICAL POLYPECTOMY  02/2016  . COLONOSCOPY    . LUMBAR DISC SURGERY    . LUMBAR LAMINECTOMY    . TONSILLECTOMY    . UPPER GASTROINTESTINAL ENDOSCOPY      Family History  Problem Relation Age of Onset  . Breast  cancer Mother   . Glaucoma Mother   . Heart attack Father   . Tuberculosis Paternal Grandmother   . Prostate cancer Paternal Grandfather   . Colon cancer Neg Hx   . Esophageal cancer Neg Hx   . Rectal cancer Neg Hx   . Stomach cancer Neg Hx     Social history:  reports that she has never smoked. She has never used smokeless tobacco. She reports that she drinks about 4.2 oz of alcohol per week . She reports that she does not use drugs.    Allergies  Allergen Reactions  . Ciprofloxacin Hives  . Dilaudid [Hydromorphone Hcl] Hives  . Flagyl [Metronidazole] Hives  . Visipaque [Iodixanol] Swelling    Throat swelling    Medications:  Prior to Admission medications   Medication Sig Start Date End Date Taking? Authorizing Provider  cetirizine (ZYRTEC) 10 MG tablet Take 10 mg by mouth daily.   Yes [provider]  citalopram (CELEXA) 20 MG tablet Take 20 mg by mouth daily.   Yes [provider]  diazepam (VALIUM) 10 MG tablet Take 10 mg by mouth every 8 (eight) hours as needed for anxiety.    Yes [provider]  diphenoxylate-atropine (LOMOTIL) 2.5-0.025 MG tablet Take 1 tablet by mouth 4 (four) times daily as needed for diarrhea or  loose stools. Reported on 10/04/2015   Yes [provider]  esomeprazole (NEXIUM) 20 MG capsule Take 20 mg by mouth daily at 12 noon.   Yes [provider]  metoprolol succinate (TOPROL-XL) 25 MG 24 hr tablet Take 25 mg by mouth daily.   Yes [provider]  ondansetron (ZOFRAN) 4 MG tablet Take 4 mg by mouth every 8 (eight) hours as needed for nausea or vomiting. Reported on 10/04/2015   Yes [provider]  Probiotic Product (ALIGN PO) Take by mouth daily.   Yes [provider]  zolpidem (AMBIEN) 10 MG tablet Take 10 mg by mouth at bedtime as needed for sleep.   Yes [provider]    ROS:  Out of a complete 14 system review of symptoms, the patient complains only of the  following symptoms, and all other reviewed systems are negative.  Weight loss Runny nose Cough Chest pain Episodic diarrhea, nausea Insomnia, acting out dreams Difficulty urinating Back pain, achy muscles Numbness Depression, anxiety  Blood pressure (!) 159/83, pulse 71, height 5' 5.5" (1.664 m).  Physical Exam  General: The patient is alert and cooperative at the time of the examination.  Respiratory: Lung fields are clear.  Cardiovascular: Regular rate and rhythm, no murmurs or rubs are noted.  Neck: Neck is supple, no carotid bruits are noted.  Eyes: Pupils are equal, round, and reactive to light. Discs are flat bilaterally.  Neuromuscular: Range of movement of the cervical spine is full.  Skin: No significant peripheral edema is noted.   Neurologic Exam  Mental status: The patient is alert and oriented x 3 at the time of the examination. The patient has apparent normal recent and remote memory, with an apparently normal attention span and concentration ability.   Cranial nerves: Facial symmetry is present. Speech is normal, no aphasia or dysarthria is noted. Extraocular movements are full. Visual fields are full.  Motor: The patient has good strength in all 4 extremities.  Sensory examination: Soft touch sensation is symmetric on the face, arms, and legs. The patient reports some decreased pinprick sensation on the left leg. The right, symmetric in arms. Vibration sensation is decreased in the left arm as compared to the right. This is also symmetric in arms.  Coordination: The patient has good finger-nose-finger and heel-to-shin bilaterally.  Gait and station: The patient has a normal gait. Tandem gait is normal. Romberg is negative. No drift is seen.  Reflexes: Deep tendon reflexes are symmetric, toes are downgoing bilaterally.   MRI brain 12/22/15:  IMPRESSION: This MRI of the brain with and without contrast shows the following: 1. Mild generalized  cortical atrophy. 2. Small number of T2/FLAIR hyperintense foci in the pons and hemispheres consistent with minimal chronic microvascular ischemic age, typical for age.  3. There is a normal enhancement pattern. There are no acute findings.  * MRI scan images were reviewed online. I agree with the written report.     Assessment/Plan:  1. Left sided chest pain  2. History of left C4 shingles   3. History of SIADH  The patient could have chest pains that are related to a prior history of shingles. She has responded to gabapentin, this will be continued. The patient is transitioning between primary care physicians, she will require refills on most of her medications currently until she can see a physician in February 2019. The patient will be sent for MRI of the cervical spine to rule out a compressive cervical radiculopathy. The  patient will follow-up through this office if needed. She has been given prescriptions for the gabapentin, Ambien, Zofran, metoprolol, Lomotil, and Celexa. She will have blood work done today to evaluate for the SIADH.  Jill Alexanders MD 06/26/2017 9:21 AM  [facility] [address] [place], [place] [address]  Phone [phone] Fax [phone]

## 2017-06-26 NOTE — Patient Instructions (Signed)
   We will check MRI of the cervical spine.

## 2017-06-27 LAB — COMPREHENSIVE METABOLIC PANEL
A/G RATIO: 2 (ref 1.2–2.2)
ALK PHOS: 120 IU/L — AB (ref 39–117)
ALT: 14 IU/L (ref 0–32)
AST: 21 IU/L (ref 0–40)
Albumin: 4.5 g/dL (ref 3.5–4.8)
BILIRUBIN TOTAL: 0.3 mg/dL (ref 0.0–1.2)
BUN/Creatinine Ratio: 18 (ref 12–28)
BUN: 14 mg/dL (ref 8–27)
CHLORIDE: 96 mmol/L (ref 96–106)
CO2: 24 mmol/L (ref 20–29)
Calcium: 9.5 mg/dL (ref 8.7–10.3)
Creatinine, Ser: 0.77 mg/dL (ref 0.57–1.00)
GFR calc Af Amer: 90 mL/min/{1.73_m2} (ref >59)
GFR calc non Af Amer: 78 mL/min/{1.73_m2} (ref >59)
GLOBULIN, TOTAL: 2.3 g/dL (ref 1.5–4.5)
Glucose: 86 mg/dL (ref 65–99)
POTASSIUM: 5.6 mmol/L — AB (ref 3.5–5.2)
SODIUM: 134 mmol/L (ref 134–144)
Total Protein: 6.8 g/dL (ref 6.0–8.5)

## 2017-06-28 ENCOUNTER — Other Ambulatory Visit: Payer: Self-pay | Admitting: Neurology

## 2017-06-28 DIAGNOSIS — Z5181 Encounter for therapeutic drug level monitoring: Secondary | ICD-10-CM

## 2017-06-29 ENCOUNTER — Other Ambulatory Visit: Payer: Self-pay

## 2017-06-29 DIAGNOSIS — Z1231 Encounter for screening mammogram for malignant neoplasm of breast: Secondary | ICD-10-CM

## 2017-07-08 ENCOUNTER — Telehealth: Payer: Self-pay | Admitting: Neurology

## 2017-07-08 ENCOUNTER — Other Ambulatory Visit: Payer: Self-pay | Admitting: *Deleted

## 2017-07-08 MED ORDER — ONDANSETRON 4 MG PO TBDP: 4.0000 mg | ORAL_TABLET | Freq: Three times a day (TID) | ORAL | 1 refills | Status: DC | PRN

## 2017-07-08 MED ORDER — DIAZEPAM 10 MG PO TABS: 10.0000 mg | ORAL_TABLET | Freq: Three times a day (TID) | ORAL | 1 refills | Status: DC | PRN

## 2017-07-08 NOTE — Telephone Encounter (Signed)
Faxed printed/signed rx diazepam to [facility] at [phone]. Received fax confirmation.

## 2017-07-08 NOTE — Telephone Encounter (Signed)
Pt called she is wanting to know if she could have MRI w/contrast. She is wanting to make sure that provider is aware she does not have any medical problems with gadolinium and "if he feels that is the better way to go that is fine". Pt also has medication questions but did not want to go into detail. Please call to discuss

## 2017-07-08 NOTE — Telephone Encounter (Signed)
I called patient. I see no indication for using gadolinium with the MRI of the cervical spine.  The patient indicates that she needs the orally disintegrating Zofran tablet, and she needs her prescription for diazepam. I will send these prescriptions in.

## 2017-07-09 ENCOUNTER — Ambulatory Visit
Admission: RE | Admit: 2017-07-09 | Discharge: 2017-07-09 | Disposition: A | Discharge status: Home / Self Care | Payer: Medicare Other | Source: Ambulatory Visit | Attending: Neurology | Admitting: Neurology

## 2017-07-09 DIAGNOSIS — R072 Precordial pain: Secondary | ICD-10-CM

## 2017-07-09 DIAGNOSIS — M50223 Other cervical disc displacement at C6-C7 level: Secondary | ICD-10-CM | POA: Diagnosis not present

## 2017-07-09 DIAGNOSIS — M50222 Other cervical disc displacement at C5-C6 level: Secondary | ICD-10-CM | POA: Diagnosis not present

## 2017-07-09 DIAGNOSIS — R269 Unspecified abnormalities of gait and mobility: Secondary | ICD-10-CM

## 2017-07-10 ENCOUNTER — Telehealth: Payer: Self-pay | Admitting: Neurology

## 2017-07-10 NOTE — Telephone Encounter (Signed)
I called patient. No evidence of nerve root impingement on the left side where her symptoms are, the study looks into the thoracic area to about the T3 level.   MRI cervical 07/10/17:  IMPRESSION:  This MRI of the cervical spine without contrast shows the following: 1.    The spinal cord appears normal and there is no significant spinal stenosis. 2.    There are multilevel degenerative changes as detailed above. At C3-C4 there is moderately severe foraminal narrowing to the right that could lead to compression of the right C4 nerve root. There is less potential for nerve root compression at the other levels.

## 2017-07-27 ENCOUNTER — Other Ambulatory Visit (INDEPENDENT_AMBULATORY_CARE_PROVIDER_SITE_OTHER): Payer: Self-pay

## 2017-07-27 DIAGNOSIS — Z5181 Encounter for therapeutic drug level monitoring: Secondary | ICD-10-CM

## 2017-07-27 DIAGNOSIS — Z0289 Encounter for other administrative examinations: Secondary | ICD-10-CM

## 2017-07-28 ENCOUNTER — Telehealth: Payer: Self-pay | Admitting: Neurology

## 2017-07-28 DIAGNOSIS — E875 Hyperkalemia: Secondary | ICD-10-CM

## 2017-07-28 LAB — BASIC METABOLIC PANEL
BUN/Creatinine Ratio: 19 (ref 12–28)
BUN: 17 mg/dL (ref 8–27)
CALCIUM: 9.6 mg/dL (ref 8.7–10.3)
CHLORIDE: 99 mmol/L (ref 96–106)
CO2: 24 mmol/L (ref 20–29)
Creatinine, Ser: 0.9 mg/dL (ref 0.57–1.00)
GFR calc Af Amer: 74 mL/min/{1.73_m2} (ref >59)
GFR, EST NON AFRICAN AMERICAN: 65 mL/min/{1.73_m2} (ref >59)
Glucose: 85 mg/dL (ref 65–99)
POTASSIUM: 5.7 mmol/L — AB (ref 3.5–5.2)
Sodium: 137 mmol/L (ref 134–144)

## 2017-07-28 NOTE — Telephone Encounter (Signed)
I called the patient.  The potassium level remains elevated, the patient in the past has had issues with high potassium and low sodium levels, she has seen nephrology and endocrinology in the past, no etiology was determined.  I will re-refer to an endocrinologist in this area to try to determine the etiology of the hyperkalemia.  Patient does not have a primary care doctor yet, she will be established with another doctor in February 2019.

## 2017-08-03 DIAGNOSIS — R079 Chest pain, unspecified: Secondary | ICD-10-CM | POA: Diagnosis not present

## 2017-08-03 DIAGNOSIS — Z79899 Other long term (current) drug therapy: Secondary | ICD-10-CM | POA: Diagnosis not present

## 2017-08-03 DIAGNOSIS — G629 Polyneuropathy, unspecified: Secondary | ICD-10-CM | POA: Diagnosis not present

## 2017-08-03 DIAGNOSIS — Z0000 Encounter for general adult medical examination without abnormal findings: Secondary | ICD-10-CM | POA: Diagnosis not present

## 2017-08-03 DIAGNOSIS — E222 Syndrome of inappropriate secretion of antidiuretic hormone: Secondary | ICD-10-CM | POA: Diagnosis not present

## 2017-08-03 DIAGNOSIS — R6889 Other general symptoms and signs: Secondary | ICD-10-CM | POA: Diagnosis not present

## 2017-08-03 DIAGNOSIS — E875 Hyperkalemia: Secondary | ICD-10-CM | POA: Diagnosis not present

## 2017-08-03 DIAGNOSIS — Z23 Encounter for immunization: Secondary | ICD-10-CM | POA: Diagnosis not present

## 2017-08-12 ENCOUNTER — Ambulatory Visit
Admission: RE | Admit: 2017-08-12 | Discharge: 2017-08-12 | Disposition: A | Discharge status: Home / Self Care | Payer: Medicare Other | Source: Ambulatory Visit

## 2017-08-12 DIAGNOSIS — Z1231 Encounter for screening mammogram for malignant neoplasm of breast: Secondary | ICD-10-CM

## 2017-09-25 ENCOUNTER — Other Ambulatory Visit: Payer: Self-pay | Admitting: Neurology

## 2017-10-08 DIAGNOSIS — E222 Syndrome of inappropriate secretion of antidiuretic hormone: Secondary | ICD-10-CM | POA: Diagnosis not present

## 2017-11-05 DIAGNOSIS — R5383 Other fatigue: Secondary | ICD-10-CM | POA: Diagnosis not present

## 2017-11-05 DIAGNOSIS — M791 Myalgia, unspecified site: Secondary | ICD-10-CM | POA: Diagnosis not present

## 2017-11-05 DIAGNOSIS — M549 Dorsalgia, unspecified: Secondary | ICD-10-CM | POA: Diagnosis not present

## 2017-11-05 DIAGNOSIS — D252 Subserosal leiomyoma of uterus: Secondary | ICD-10-CM | POA: Diagnosis not present

## 2017-11-05 DIAGNOSIS — F419 Anxiety disorder, unspecified: Secondary | ICD-10-CM | POA: Diagnosis not present

## 2017-11-05 DIAGNOSIS — N94819 Vulvodynia, unspecified: Secondary | ICD-10-CM | POA: Diagnosis not present

## 2017-11-05 DIAGNOSIS — R3911 Hesitancy of micturition: Secondary | ICD-10-CM | POA: Diagnosis not present

## 2017-11-05 DIAGNOSIS — N898 Other specified noninflammatory disorders of vagina: Secondary | ICD-10-CM | POA: Diagnosis not present

## 2017-11-05 DIAGNOSIS — R102 Pelvic and perineal pain: Secondary | ICD-10-CM | POA: Diagnosis not present

## 2017-11-10 ENCOUNTER — Ambulatory Visit (INDEPENDENT_AMBULATORY_CARE_PROVIDER_SITE_OTHER): Payer: Medicare Other | Admitting: Family Medicine

## 2017-11-10 ENCOUNTER — Encounter: Payer: Self-pay | Admitting: Family Medicine

## 2017-11-10 DIAGNOSIS — R269 Unspecified abnormalities of gait and mobility: Secondary | ICD-10-CM

## 2017-11-11 NOTE — Assessment & Plan Note (Signed)
See above

## 2017-11-11 NOTE — Progress Notes (Signed)
I was running behind scheduled due to emergent patient care with another patient.  Patient left before eval.  I didn't get a chance to talk to patient or apologize about running behind.

## 2017-11-16 DIAGNOSIS — R6889 Other general symptoms and signs: Secondary | ICD-10-CM | POA: Diagnosis not present

## 2017-11-16 DIAGNOSIS — E875 Hyperkalemia: Secondary | ICD-10-CM | POA: Diagnosis not present

## 2017-11-16 DIAGNOSIS — R079 Chest pain, unspecified: Secondary | ICD-10-CM | POA: Diagnosis not present

## 2017-11-16 DIAGNOSIS — Z9109 Other allergy status, other than to drugs and biological substances: Secondary | ICD-10-CM | POA: Diagnosis not present

## 2017-11-16 DIAGNOSIS — E222 Syndrome of inappropriate secretion of antidiuretic hormone: Secondary | ICD-10-CM | POA: Diagnosis not present

## 2017-11-16 DIAGNOSIS — G629 Polyneuropathy, unspecified: Secondary | ICD-10-CM | POA: Diagnosis not present

## 2017-11-16 DIAGNOSIS — Z0000 Encounter for general adult medical examination without abnormal findings: Secondary | ICD-10-CM | POA: Diagnosis not present

## 2017-11-16 DIAGNOSIS — Z23 Encounter for immunization: Secondary | ICD-10-CM | POA: Diagnosis not present

## 2017-11-23 ENCOUNTER — Encounter: Payer: Self-pay | Admitting: Family Medicine

## 2017-11-25 ENCOUNTER — Other Ambulatory Visit: Payer: Self-pay | Admitting: Neurology

## 2018-01-05 DIAGNOSIS — E222 Syndrome of inappropriate secretion of antidiuretic hormone: Secondary | ICD-10-CM | POA: Diagnosis not present

## 2018-04-16 ENCOUNTER — Encounter: Payer: Self-pay | Admitting: Obstetrics & Gynecology

## 2018-04-21 ENCOUNTER — Other Ambulatory Visit: Payer: Self-pay | Admitting: Neurology

## 2018-05-05 DIAGNOSIS — H2513 Age-related nuclear cataract, bilateral: Secondary | ICD-10-CM | POA: Diagnosis not present

## 2018-06-01 DIAGNOSIS — E2839 Other primary ovarian failure: Secondary | ICD-10-CM | POA: Diagnosis not present

## 2018-06-01 DIAGNOSIS — G629 Polyneuropathy, unspecified: Secondary | ICD-10-CM | POA: Diagnosis not present

## 2018-06-01 DIAGNOSIS — E222 Syndrome of inappropriate secretion of antidiuretic hormone: Secondary | ICD-10-CM | POA: Diagnosis not present

## 2018-06-01 DIAGNOSIS — E7889 Other lipoprotein metabolism disorders: Secondary | ICD-10-CM | POA: Diagnosis not present

## 2018-06-01 LAB — BASIC METABOLIC PANEL
Glucose: 96
Potassium: 5.4 — AB (ref 3.4–5.3)
Sodium: 137 (ref 137–147)

## 2018-06-01 LAB — LIPID PANEL
Cholesterol: 220 — AB (ref 0–200)
HDL: 99 — AB (ref 35–70)
LDL Cholesterol: 103
Triglycerides: 89 (ref 40–160)

## 2018-06-01 LAB — CBC AND DIFFERENTIAL
HCT: 37 (ref 36–46)
HEMOGLOBIN: 12.4 (ref 12.0–16.0)
PLATELETS: 273 (ref 150–399)
WBC: 3.3

## 2018-06-01 LAB — TSH: TSH: 1.56 (ref <=5.90)

## 2018-06-01 LAB — HEPATIC FUNCTION PANEL
ALK PHOS: 105 (ref 25–125)
ALT: 14 (ref 7–35)
AST: 18 (ref 13–35)

## 2018-06-01 LAB — VITAMIN D 25 HYDROXY (VIT D DEFICIENCY, FRACTURES): Vit D, 25-Hydroxy: 9.7

## 2018-06-17 DIAGNOSIS — M25561 Pain in right knee: Secondary | ICD-10-CM | POA: Diagnosis not present

## 2018-06-17 DIAGNOSIS — M25562 Pain in left knee: Secondary | ICD-10-CM | POA: Diagnosis not present

## 2018-07-06 ENCOUNTER — Other Ambulatory Visit: Payer: Self-pay

## 2018-07-06 NOTE — Progress Notes (Signed)
Entered in error

## 2018-07-12 DIAGNOSIS — L299 Pruritus, unspecified: Secondary | ICD-10-CM | POA: Diagnosis not present

## 2018-07-12 DIAGNOSIS — L111 Transient acantholytic dermatosis [Grover]: Secondary | ICD-10-CM | POA: Diagnosis not present

## 2018-07-13 ENCOUNTER — Encounter: Payer: Self-pay | Admitting: Obstetrics & Gynecology

## 2018-07-13 ENCOUNTER — Ambulatory Visit (INDEPENDENT_AMBULATORY_CARE_PROVIDER_SITE_OTHER): Payer: Medicare Other | Admitting: Obstetrics & Gynecology

## 2018-07-13 ENCOUNTER — Other Ambulatory Visit (HOSPITAL_COMMUNITY)
Admission: RE | Admit: 2018-07-13 | Discharge: 2018-07-13 | Disposition: A | Discharge status: Home / Self Care | Payer: Medicare Other | Source: Ambulatory Visit | Attending: Obstetrics & Gynecology | Admitting: Obstetrics & Gynecology

## 2018-07-13 ENCOUNTER — Other Ambulatory Visit: Payer: Self-pay

## 2018-07-13 VITALS — BP 142/82 | HR 86 | Resp 16 | Ht 65.0 in | Wt 194.0 lb

## 2018-07-13 DIAGNOSIS — R3915 Urgency of urination: Secondary | ICD-10-CM

## 2018-07-13 DIAGNOSIS — Z01419 Encounter for gynecological examination (general) (routine) without abnormal findings: Secondary | ICD-10-CM | POA: Diagnosis not present

## 2018-07-13 DIAGNOSIS — E222 Syndrome of inappropriate secretion of antidiuretic hormone: Secondary | ICD-10-CM | POA: Diagnosis not present

## 2018-07-13 DIAGNOSIS — Z124 Encounter for screening for malignant neoplasm of cervix: Secondary | ICD-10-CM

## 2018-07-13 DIAGNOSIS — E559 Vitamin D deficiency, unspecified: Secondary | ICD-10-CM | POA: Diagnosis not present

## 2018-07-13 DIAGNOSIS — N94819 Vulvodynia, unspecified: Secondary | ICD-10-CM

## 2018-07-13 DIAGNOSIS — E2839 Other primary ovarian failure: Secondary | ICD-10-CM

## 2018-07-13 DIAGNOSIS — Z0000 Encounter for general adult medical examination without abnormal findings: Secondary | ICD-10-CM | POA: Diagnosis not present

## 2018-07-13 LAB — POCT URINALYSIS DIPSTICK
Bilirubin, UA: NEGATIVE
Blood, UA: NEGATIVE
Glucose, UA: NEGATIVE
KETONES UA: NEGATIVE
Leukocytes, UA: NEGATIVE
NITRITE UA: NEGATIVE
PH UA: 6 (ref 5.0–8.0)
PROTEIN UA: NEGATIVE
Urobilinogen, UA: 0.2 E.U./dL

## 2018-07-13 NOTE — Progress Notes (Addendum)
72 y.o. [place] Married White or Caucasian female here for new patient annual exam.  Moved to [place] in 2016 and needs to establish care with PCP.  Downsized when she moved here but they are buying a new home because the house they bought was too small.  Feels house is just too cramped.   H/o pelvic discomfort.  Evaluation and PUS done at [facility].  Showed endometrial polyps.  Had hysteroscopy 7/17 with benign polyps.  Denies vaginal bleeding.     Reports she's having issues with urinary urgency and trouble starting her stream.  She is also having perivaginal burning.  Has used Betamethasone.  Using aquaphor and not steroid as much.  She is using vaginal estrogen cream.  Has been diagnosed with vulvodynia and has been followed at [facility] by Dr. Hiram Comber.  Also has some skin issues on her back--possible folliculitis.  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  Frustrated with this diagnosis and no significant improvement.  Would like to see Dr. Ubaldo Glassing at [facility] dermatology.  Also has been diagnosed with post herpetic neuralgia.  Has been on gabapentin for this.  The 600mg  dosage makes her very sedate so she cuts this in 1/2.    No LMP recorded. Patient is postmenopausal.          Sexually active: Yes.    The current method of family planning is post menopausal status.    Exercising: Yes.    personal trainer  Smoker:  no  Health Maintenance: Pap:  ~2014 Normal  History of abnormal Pap:  no MMG:  08/12/17 BIRADS1:Neg  Colonoscopy:  07/02/12--will obtain copy for medical record BMD: 03/20/2016 care everywhere   TDaP: 2016 Pneumonia vaccine(s): done  Shingrix: Has started and received one vaccination Hep C testing: 06/02/16 Neg  Screening Labs: BMP, Vit D   reports that she has never smoked. She has never used smokeless tobacco. She reports that she drinks about 14.0 standard drinks of alcohol per week. She reports that she does not use drugs.  Past Medical History:  Diagnosis Date  . Allergy   .  Anemia   . Anxiety   . Arthritis    djd  . Cardiac arrhythmia   . Depression   . Diverticulitis   . Diverticulosis   . GERD (gastroesophageal reflux disease)   . Heart murmur   . HTN (hypertension)   . Infertility, female   . Neuromuscular disorder ([place])    idiopathic peripheral  neuropathy  . Peripheral neuropathy   . SIADH (syndrome of inappropriate ADH production) ([place])     Past Surgical History:  Procedure Laterality Date  . BREAST BIOPSY Left   . BREAST EXCISIONAL BIOPSY Left    benign  . CERVICAL BIOPSY  W/ LOOP ELECTRODE EXCISION    . CERVICAL POLYPECTOMY  02/2016  . COLONOSCOPY    . COLPOSCOPY    . DILATION AND CURETTAGE OF UTERUS    . HYSTEROSCOPY    . LUMBAR DISC SURGERY    . LUMBAR LAMINECTOMY    . TONSILLECTOMY    . UPPER GASTROINTESTINAL ENDOSCOPY      Current Outpatient Medications  Medication Sig Dispense Refill  . betamethasone dipropionate (DIPROLENE) 0.05 % cream Apply topically daily as needed.    . cetirizine (ZYRTEC) 10 MG tablet Take 10 mg by mouth daily.    . citalopram (CELEXA) 20 MG tablet Take 1 tablet (20 mg total) by mouth daily. 30 tablet 3  . diazepam (VALIUM) 10 MG tablet Take  1 tablet (10 mg total) by mouth every 8 (eight) hours as needed for anxiety. 30 tablet 1  . diphenoxylate-atropine (LOMOTIL) 2.5-0.025 MG tablet Take 1 tablet by mouth 4 (four) times daily as needed for diarrhea or loose stools. Reported on 10/04/2015 30 tablet 3  . estradiol (ESTRACE) 0.1 MG/GM vaginal cream Insert 1 gm vaginally qhs x 14 nights, then twice weekly    . gabapentin (NEURONTIN) 600 MG tablet Take 1 tablet (600 mg total) by mouth 3 (three) times daily. 270 tablet 1  . ibuprofen (ADVIL,MOTRIN) 200 MG tablet Take by mouth daily as needed.    . metoprolol succinate (TOPROL-XL) 25 MG 24 hr tablet Take 1 tablet (25 mg total) by mouth daily. 30 tablet 3  . ondansetron (ZOFRAN) 8 MG tablet Take by mouth daily as needed.    . Probiotic Product (ALIGN PO) Take by  mouth daily.    . Vitamin D, Ergocalciferol, (DRISDOL) 50000 units CAPS capsule Take 50,000 Units by mouth every 7 (seven) days.    Marland Kitchen zolpidem (AMBIEN) 10 MG tablet TAKE ONE TABLET AT BEDTIME AS NEEDED FOR SLEEP 30 tablet 3   No current facility-administered medications for this visit.     Family History  Problem Relation Age of Onset  . Breast cancer Mother   . Glaucoma Mother   . Heart attack Father   . Tuberculosis Paternal Grandmother   . Prostate cancer Paternal Grandfather   . Colon cancer Neg Hx   . Esophageal cancer Neg Hx   . Rectal cancer Neg Hx   . Stomach cancer Neg Hx     Review of Systems  All other systems reviewed and are negative.   Exam:   BP (!) 142/82 (BP Location: Left Arm, Patient Position: Sitting, Cuff Size: Large)   Pulse 86   Resp 16   Ht 5\' 5"  (1.651 m)   Wt 194 lb (88 kg)   BMI 32.28 kg/m   Height: 5\' 5"  (165.1 cm)  Ht Readings from Last 3 Encounters:  07/13/18 5\' 5"  (1.651 m)  06/26/17 5' 5.5" (1.664 m)  06/02/16 5' 5.5" (1.664 m)    General appearance: alert, cooperative and appears stated age Head: Normocephalic, without obvious abnormality, atraumatic Neck: no adenopathy, supple, symmetrical, trachea midline and thyroid normal to inspection and palpation Lungs: clear to auscultation bilaterally Breasts: normal appearance, no masses or tenderness Heart: regular rate and rhythm Abdomen: soft, non-tender; bowel sounds normal; no masses,  no organomegaly Extremities: extremities normal, atraumatic, no cyanosis or edema Skin: Skin color, texture, turgor normal. No rashes or lesions Lymph nodes: Cervical, supraclavicular, and axillary nodes normal. No abnormal inguinal nodes palpated Neurologic: Grossly normal   Pelvic: External genitalia:  no lesions              Urethra:  normal appearing urethra with no masses, tenderness or lesions              Bartholins and Skenes: normal                 Vagina: normal appearing vagina with normal  color and discharge, no lesions              Cervix: no lesions              Pap taken: Yes.   Bimanual Exam:  Uterus:  normal size, contour, position, consistency, mobility, non-tender              Adnexa: normal adnexa and no mass,  fullness, tenderness               Rectovaginal: Confirms               Anus:  normal sphincter tone, no lesions  Chaperone was present for exam.  A:  Well Woman with normal exam PMP, no HRT Skin rash/folliculitis, has seen derm Urinary hesitancy H/o endometrial polyp Vuvodynia--followed at [facility]  P:   Mammogram guidelines reviewed.  3D reviewed. pap smear obtained today Names for dermatologist given and discussed With no abnormal exam findings, urology consultation discussed and recommended.  Urine culture obtained.  If negative, feel should proceed with evaluation.   BMD ordered CMP and Vit D obtained We did also discuss possible topical products that could be contributing to vulvar issues--specifically toilet paper she is using.  Advised to use [place] products. PCP names discussed as well. return annually or prn  In excess of one hour spent with pt.  In addition to AEX and discussion of health maintenance, approximately an additional 20 minutes spent in discussion of urinary and vulvar issues as well as referrals pt desires

## 2018-07-14 LAB — COMPREHENSIVE METABOLIC PANEL
ALBUMIN: 4.7 g/dL (ref 3.5–4.8)
ALT: 16 IU/L (ref 0–32)
AST: 21 IU/L (ref 0–40)
Albumin/Globulin Ratio: 2.1 (ref 1.2–2.2)
Alkaline Phosphatase: 108 IU/L (ref 39–117)
BUN/Creatinine Ratio: 23 (ref 12–28)
BUN: 16 mg/dL (ref 8–27)
Bilirubin Total: 0.3 mg/dL (ref 0.0–1.2)
CO2: 24 mmol/L (ref 20–29)
Calcium: 9.4 mg/dL (ref 8.7–10.3)
Chloride: 92 mmol/L — ABNORMAL LOW (ref 96–106)
Creatinine, Ser: 0.7 mg/dL (ref 0.57–1.00)
GFR, EST AFRICAN AMERICAN: 100 mL/min/{1.73_m2} (ref >59)
GFR, EST NON AFRICAN AMERICAN: 87 mL/min/{1.73_m2} (ref >59)
GLOBULIN, TOTAL: 2.2 g/dL (ref 1.5–4.5)
Glucose: 87 mg/dL (ref 65–99)
POTASSIUM: 5.1 mmol/L (ref 3.5–5.2)
Sodium: 132 mmol/L — ABNORMAL LOW (ref 134–144)
TOTAL PROTEIN: 6.9 g/dL (ref 6.0–8.5)

## 2018-07-14 LAB — URINE CULTURE

## 2018-07-14 LAB — VITAMIN D 25 HYDROXY (VIT D DEFICIENCY, FRACTURES): Vit D, 25-Hydroxy: 24.7 ng/mL — ABNORMAL LOW (ref 30.0–100.0)

## 2018-07-15 ENCOUNTER — Telehealth: Payer: Self-pay | Admitting: Obstetrics & Gynecology

## 2018-07-15 DIAGNOSIS — R21 Rash and other nonspecific skin eruption: Secondary | ICD-10-CM

## 2018-07-15 DIAGNOSIS — Z7689 Persons encountering health services in other specified circumstances: Secondary | ICD-10-CM

## 2018-07-15 NOTE — Telephone Encounter (Signed)
Patient called requesting assistance with scheduling two referrals  1. Dr. Rolm Bookbinder at [facility]. She said she has an issue she needs to be seen for right away but could not get in for over a year. 2. Dr. Lang Snow at [facility]. She stated he is not accepting new patient but hoped Dr. [name] Heck could get her in with him.  Patient also would like to know if her lab results are back yet.

## 2018-07-15 NOTE — Telephone Encounter (Signed)
Routing to Dr. [name] Heck to review.  I do not think triage can get referrals into [facility] or [facility] if patient not able to.

## 2018-07-16 LAB — CYTOLOGY - PAP

## 2018-07-16 NOTE — Telephone Encounter (Signed)
Patient calling to check status of referrals.

## 2018-07-16 NOTE — Telephone Encounter (Signed)
Spoke with patient and advised of referrals.   She requests assistance to schedule with any provider who can see her to establish primary care. Referral placed to Dr. Rogers Blocker at [facility], pt happy to have an option.   Referral placed for dermatology. Aware that she cannot get into see Dr. Ubaldo Glassing.   Advised per Dr. [name] Heck called Dr. Brigitte Pulse personally, not accepting new patients.   Advised she will be contacted with results, pt states mychart is great if Dr. [name] Heck prefers to send results that way.   Update to Dr. [name] Heck. Encounter is closed.

## 2018-07-22 ENCOUNTER — Encounter: Payer: Self-pay | Admitting: Obstetrics & Gynecology

## 2018-07-22 DIAGNOSIS — E559 Vitamin D deficiency, unspecified: Secondary | ICD-10-CM | POA: Insufficient documentation

## 2018-07-23 ENCOUNTER — Ambulatory Visit: Payer: Medicare Other | Admitting: Cardiovascular Disease

## 2018-07-28 DIAGNOSIS — L308 Other specified dermatitis: Secondary | ICD-10-CM | POA: Diagnosis not present

## 2018-07-28 DIAGNOSIS — L218 Other seborrheic dermatitis: Secondary | ICD-10-CM | POA: Diagnosis not present

## 2018-07-29 ENCOUNTER — Ambulatory Visit: Payer: Medicare Other | Admitting: Family Medicine

## 2018-08-03 ENCOUNTER — Telehealth: Payer: Self-pay | Admitting: Family Medicine

## 2018-08-03 NOTE — Telephone Encounter (Signed)
error 

## 2018-08-12 ENCOUNTER — Encounter: Payer: Self-pay | Admitting: Family Medicine

## 2018-08-12 ENCOUNTER — Ambulatory Visit (INDEPENDENT_AMBULATORY_CARE_PROVIDER_SITE_OTHER): Payer: Medicare Other | Admitting: Family Medicine

## 2018-08-12 VITALS — BP 138/80 | HR 68 | Temp 97.7°F | Ht 65.0 in | Wt 183.0 lb

## 2018-08-12 DIAGNOSIS — F419 Anxiety disorder, unspecified: Secondary | ICD-10-CM | POA: Diagnosis not present

## 2018-08-12 DIAGNOSIS — G609 Hereditary and idiopathic neuropathy, unspecified: Secondary | ICD-10-CM

## 2018-08-12 DIAGNOSIS — Z0000 Encounter for general adult medical examination without abnormal findings: Secondary | ICD-10-CM | POA: Diagnosis not present

## 2018-08-12 DIAGNOSIS — K219 Gastro-esophageal reflux disease without esophagitis: Secondary | ICD-10-CM | POA: Diagnosis not present

## 2018-08-12 DIAGNOSIS — B0229 Other postherpetic nervous system involvement: Secondary | ICD-10-CM | POA: Insufficient documentation

## 2018-08-12 DIAGNOSIS — E222 Syndrome of inappropriate secretion of antidiuretic hormone: Secondary | ICD-10-CM | POA: Diagnosis not present

## 2018-08-12 DIAGNOSIS — I10 Essential (primary) hypertension: Secondary | ICD-10-CM | POA: Diagnosis not present

## 2018-08-12 DIAGNOSIS — Z23 Encounter for immunization: Secondary | ICD-10-CM

## 2018-08-12 MED ORDER — HYDROXYZINE HCL 25 MG PO TABS: 25.0000 mg | ORAL_TABLET | Freq: Three times a day (TID) | ORAL | 1 refills | Status: DC | PRN

## 2018-08-12 NOTE — Patient Instructions (Signed)
So nice to meet you!  See you back in 6 months for regular f/u of blood pressure Will try to get your shot records.   Let me know when you need refills. Sent in 25mg  of hydroxyzine for you. I would double up on your zyrtec and do 10mg  BID. May dry you out a little bit more.

## 2018-08-12 NOTE — Progress Notes (Addendum)
Phone: [phone]  Subjective:  Patient presents today for their  Medicare Exam. Also has HTN, insomnia, anxiety/depression.   Hypertension: Here for follow up of hypertension.  Currently on metoprolol 25mg .  Takes medication as prescribed and denies any side effects. Exercise includes (not much at the moment). Weight has been increasing. Denies any chest pain, headaches, shortness of breath, vision changes, swelling in lower extremities.   Post herpatic neuralgia: currently on gabapentin. Does not take as scheduled.   anxiety: currently on celexa daily and valium prn. Strong family history of depression. She thinks she has been on something since her mother died over 58 years ago.   Insomnia: she is currently on ambien nightly. She occassionally has to take a valium. Has terrible sleep issues since she was a kid. She does practice sleep hygiene.   Preventive Screening-Counseling & Management  Advanced directives: yes (originiated in [place])  Smoking Status: Never smoker Second Hand Smoking status: No smokers in home  Risk Factors Regular exercise: yes Diet: well balanced, veggies and yogurt  Fall Risk: yes No flowsheet data found. Opioid use history:  no long term opioids use  Cardiac risk factors:  advanced age (older than 53 for men, 61 for women) yes Hyperlipidemia no No diabetes. no Family History: unknown   Depression Screen None. PHQ2 0  Depression screen PHQ 2/9 11/10/2017  Decreased Interest 0  Down, Depressed, Hopeless 0  PHQ - 2 Score 0    Activities of Daily Living Independent ADLs and IADLs   Hearing Difficulties: -patient declines  Cognitive Testing: mini cog score 4/5 No reported trouble.    Normal 3 word recall  List the Names of Other Physician/Practitioners you currently use: -cardiology: Dr. Burt Knack -neurologist: Dr. Jannifer Franklin -GI: Dr. Henrene Pastor  -gyn: Dr. [name] Heck   Immunization History  Administered Date(s) Administered  . Influenza,  High Dose Seasonal PF 08/12/2018  . Pneumococcal Conjugate-13 10/05/2014  . Pneumococcal Polysaccharide-23 09/01/2017  . Tdap 07/07/2015  . Zoster Recombinat (Shingrix) 05/28/2018, 08/12/2018   Required Immunizations needed today-2nd shringrix and high dose flu.   Screening tests- up to date Health Maintenance Due  Topic Date Due  . DEXA SCAN  04/25/2011    Review of Systems  Constitutional: Negative for chills, fever and weight loss.  HENT: Negative for congestion, ear discharge, ear pain, hearing loss, sinus pain and sore throat.   Eyes: Negative for double vision.  Respiratory: Negative for cough and shortness of breath.   Cardiovascular: Negative for chest pain, palpitations, orthopnea and leg swelling.  Gastrointestinal: Negative for abdominal pain, blood in stool, heartburn, nausea and vomiting.  Genitourinary: Negative for dysuria and urgency.  Musculoskeletal: Positive for joint pain.  Skin: Positive for itching.  Neurological: Positive for tingling.       Gait instability   Psychiatric/Behavioral: Negative for depression, memory loss and suicidal ideas. The patient is nervous/anxious and has insomnia.      The following were reviewed and entered/updated in epic: Past Medical History:  Diagnosis Date  . Allergy   . Anemia   . Anxiety   . Arthritis    djd  . Cardiac arrhythmia   . Depression   . Diverticulitis   . Diverticulosis   . GERD (gastroesophageal reflux disease)   . Heart murmur   . HTN (hypertension)   . Infertility, female   . Neuromuscular disorder ([place])    idiopathic peripheral  neuropathy  . Peripheral neuropathy   . Positive PPD   . SIADH (syndrome of inappropriate ADH  production) [facility])    Patient Active Problem List   Diagnosis Date Noted  . Post herpetic neuralgia 08/12/2018  . Idiopathic peripheral neuropathy 08/12/2018  . Vitamin D deficiency 07/22/2018  . 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 Opacity noted on  imaging study   . Chest pain 01/21/2016  . SIADH (syndrome of inappropriate ADH production) ([place]) 01/21/2016  . Hypertension 01/21/2016  . Anxiety 01/21/2016  . GERD (gastroesophageal reflux disease) 01/21/2016  . Abnormality of gait 12/12/2015   Past Surgical History:  Procedure Laterality Date  . BREAST BIOPSY Left   . BREAST EXCISIONAL BIOPSY Left    benign  . CERVICAL BIOPSY  W/ LOOP ELECTRODE EXCISION    . CERVICAL POLYPECTOMY  02/2016  . COLONOSCOPY    . COLPOSCOPY    . DILATION AND CURETTAGE OF UTERUS    . HYSTEROSCOPY    . LUMBAR DISC SURGERY    . LUMBAR LAMINECTOMY    . TONSILLECTOMY    . UPPER GASTROINTESTINAL ENDOSCOPY      Family History  Problem Relation Age of Onset  . Breast cancer Mother   . Glaucoma Mother   . Miscarriages / [place] Mother   . Heart attack Father   . Depression Father   . Early death Father   . Heart disease Father   . Tuberculosis Paternal Grandmother   . Prostate cancer Paternal Grandfather   . Depression Brother   . Hypertension Brother   . Colon cancer Neg Hx   . Esophageal cancer Neg Hx   . Rectal cancer Neg Hx   . Stomach cancer Neg Hx     Medications- reviewed and updated Current Outpatient Medications  Medication Sig Dispense Refill  . betamethasone dipropionate (DIPROLENE) 0.05 % cream Apply topically daily as needed.    . citalopram (CELEXA) 20 MG tablet Take 1 tablet (20 mg total) by mouth daily. 30 tablet 3  . diazepam (VALIUM) 10 MG tablet Take 1 tablet (10 mg total) by mouth every 8 (eight) hours as needed for anxiety. 30 tablet 1  . diphenoxylate-atropine (LOMOTIL) 2.5-0.025 MG tablet Take 1 tablet by mouth 4 (four) times daily as needed for diarrhea or loose stools. Reported on 10/04/2015 30 tablet 3  . estradiol (ESTRACE) 0.1 MG/GM vaginal cream Insert 1 gm vaginally qhs x 14 nights, then twice weekly    . gabapentin (NEURONTIN) 600 MG tablet Take 1 tablet (600 mg total) by mouth 3 (three) times daily. 270  tablet 1  . hydrOXYzine (ATARAX/VISTARIL) 25 MG tablet Take 1 tablet (25 mg total) by mouth 3 (three) times daily as needed. 90 tablet 1  . ibuprofen (ADVIL,MOTRIN) 200 MG tablet Take by mouth daily as needed.    . metoprolol succinate (TOPROL-XL) 25 MG 24 hr tablet Take 1 tablet (25 mg total) by mouth daily. 30 tablet 3  . ondansetron (ZOFRAN) 8 MG tablet Take by mouth daily as needed.    . Probiotic Product (ALIGN PO) Take by mouth daily.    . Vitamin D, Ergocalciferol, (DRISDOL) 50000 units CAPS capsule Take 50,000 Units by mouth every 7 (seven) days.    Marland Kitchen zolpidem (AMBIEN) 10 MG tablet TAKE ONE TABLET AT BEDTIME AS NEEDED FOR SLEEP 30 tablet 3   No current facility-administered medications for this visit.     Allergies-reviewed and updated Allergies  Allergen Reactions  . Fentanyl Itching  . Ciprofloxacin Hives  . Dilaudid [Hydromorphone Hcl] Hives  . Flagyl [Metronidazole] Hives  . Visipaque [Iodixanol] Swelling  Throat swelling    Social History   Socioeconomic History  . Marital status: Married    Spouse name: Jenny Reichmann  . Number of children: 0  . Years of education: College  . Highest education level: Not on file  Occupational History  . Occupation: retired Engineer, drilling  Social Needs  . Financial resource strain: Not on file  . Food insecurity:    Worry: Not on file    Inability: Not on file  . Transportation needs:    Medical: Not on file    Non-medical: Not on file  Tobacco Use  . Smoking status: Never Smoker  . Smokeless tobacco: Never Used  Substance and Sexual Activity  . Alcohol use: Yes    Alcohol/week: 14.0 standard drinks    Types: 14 Glasses of wine per week  . Drug use: No  . Sexual activity: Yes    Birth control/protection: Post-menopausal  Lifestyle  . Physical activity:    Days per week: Not on file    Minutes per session: Not on file  . Stress: Not on file  Relationships  . Social connections:    Talks on phone: Not on file    Gets together:  Not on file    Attends religious service: Not on file    Active member of club or organization: Not on file    Attends meetings of clubs or organizations: Not on file    Relationship status: Not on file  Other Topics Concern  . Not on file  Social History Narrative   Lives at home with her husband.   Right-handed.   1-2 cups caffeine per day.    Objective: BP 138/80 (BP Location: Left Arm, Patient Position: Sitting, Cuff Size: Normal)   Pulse 68   Temp 97.7 F (36.5 C)   Ht 5\' 5"  (1.651 m)   Wt 183 lb (83 kg)   LMP  (LMP Unknown)   SpO2 98%   BMI 30.45 kg/m   Physical Exam  Constitutional: She is oriented to person, place, and time and well-developed, well-nourished, and in no distress.  HENT:  Head: Normocephalic and atraumatic.  Right Ear: External ear normal.  Left Ear: External ear normal.  Mouth/Throat: Oropharynx is clear and moist. No oropharyngeal exudate.  Tm pearly with light reflex bilaterally    Eyes: Pupils are equal, round, and reactive to light. Conjunctivae and EOM are normal.  Neck: Normal range of motion. Neck supple. No thyromegaly present.  Cardiovascular: Normal rate, regular rhythm and intact distal pulses.  Murmur heard. Pulmonary/Chest: Effort normal and breath sounds normal.  Abdominal: Soft. Bowel sounds are normal.  Lymphadenopathy:    She has no cervical adenopathy.  Neurological: She is alert and oriented to person, place, and time. She has normal reflexes. No cranial nerve deficit.  Skin: Skin is warm and dry. No rash noted.  Psychiatric: Affect and judgment normal.  Vitals reviewed.  exam   Assessment/Plan: Annual  Medicare exam completed- discussed recommended screenings anddocumented any personalized health advice and referrals for preventive counseling. See AVS as well which was given to patient.   Status of chronic or acute concerns   1.  SIADH (syndrome of inappropriate ADH production) ([place]) Standing order placed for monthly  bmp checks. On no medication. Has been stable.  - Basic metabolic panel - Basic metabolic panel; Standing  2. Post herpetic neuralgia Continue gabapentin prn. Refills as needed.   6. Essential hypertension Blood pressure is to goal. Continue current anti-hypertensive medications. No Refills  given and routine lab work will be done today. Recommended routine exercise and healthy diet including DASH diet and mediterranean diet. Encouraged weight loss. F/u in 6 months. Discussed changing her off beta blocker, but she would like to stay on this since she likes anything that can help her sleep.   - CBC with Differential/Platelet - Lipid panel  7. Idiopathic peripheral neuropathy Already seen by neurology. Labs all done, but will check thyroid today. On gabapentin.  - TSH  8. Need for prophylactic vaccination and inoculation against varicella Discussed medicare may not pay for this and could have a high co pay due to this vaccine. She would like to go ahead and get it here as she states the pharmacy's do not have any available.  - Varicella-zoster vaccine IM (Shingrix)  9. Need for prophylactic vaccination and inoculation against influenza  - Flu vaccine HIGH DOSE PF (Fluzone High dose)  10. Anxiety -continue celexa. No medication refills needed today. Takes valium prn. Drug contract signed today. No refills needed of the valium. Also can try hydroxyzine prn for anxiety and would rather her use this than the BZD. Fall risks discussed. Seems to take more for sleep than anxiety. F/u in 6 months.    Future Appointments  Date Time Provider [place]  08/30/2018 10:00 AM Daune Perch, NP [facility]  11/15/2019  1:30 PM Megan Salon, MD [place] None   Return in about 6 months (around 02/10/2019) for htn/anxiety .     Lab/Order associations: Post herpetic neuralgia  Gastroesophageal reflux disease, esophagitis presence not specified  SIADH (syndrome of  inappropriate ADH production) ([place]) - Plan: Basic metabolic panel, Basic metabolic panel  Essential hypertension - Plan: CBC with Differential/Platelet, Lipid panel  Idiopathic peripheral neuropathy - Plan: TSH  Need for prophylactic vaccination and inoculation against varicella - Plan: Varicella-zoster vaccine IM (Shingrix)  Need for prophylactic vaccination and inoculation against influenza - Plan: Flu vaccine HIGH DOSE PF (Fluzone High dose)  Anxiety  Meds ordered this encounter  Medications  . hydrOXYzine (ATARAX/VISTARIL) 25 MG tablet    Sig: Take 1 tablet (25 mg total) by mouth 3 (three) times daily as needed.    Dispense:  90 tablet    Refill:  1    Return precautions advised. >30 minutes spent in care coordination of chronic health issues, medications, etc. Outside of medicare exam.   Orma Flaming, MD

## 2018-08-13 LAB — LIPID PANEL
Cholesterol: 241 mg/dL — ABNORMAL HIGH (ref 0–200)
HDL: 106.7 mg/dL (ref >39.00)
LDL Cholesterol: 118 mg/dL — ABNORMAL HIGH (ref 0–99)
NonHDL: 134.44
Total CHOL/HDL Ratio: 2
Triglycerides: 82 mg/dL (ref 0.0–149.0)
VLDL: 16.4 mg/dL (ref 0.0–40.0)

## 2018-08-13 LAB — CBC WITH DIFFERENTIAL/PLATELET
BASOS ABS: 0.1 10*3/uL (ref 0.0–0.1)
BASOS PCT: 1.3 % (ref 0.0–3.0)
EOS ABS: 0.1 10*3/uL (ref 0.0–0.7)
Eosinophils Relative: 2.9 % (ref 0.0–5.0)
HCT: 35.3 % — ABNORMAL LOW (ref 36.0–46.0)
Hemoglobin: 12.2 g/dL (ref 12.0–15.0)
LYMPHS ABS: 1.1 10*3/uL (ref 0.7–4.0)
Lymphocytes Relative: 22.6 % (ref 12.0–46.0)
MCHC: 34.6 g/dL (ref 30.0–36.0)
MCV: 94.2 fl (ref 78.0–100.0)
Monocytes Absolute: 0.7 10*3/uL (ref 0.1–1.0)
Monocytes Relative: 14 % — ABNORMAL HIGH (ref 3.0–12.0)
NEUTROS ABS: 2.9 10*3/uL (ref 1.4–7.7)
NEUTROS PCT: 59.2 % (ref 43.0–77.0)
PLATELETS: 280 10*3/uL (ref 150.0–400.0)
RBC: 3.74 Mil/uL — ABNORMAL LOW (ref 3.87–5.11)
RDW: 13.6 % (ref 11.5–15.5)
WBC: 5 10*3/uL (ref 4.0–10.5)

## 2018-08-13 LAB — BASIC METABOLIC PANEL
BUN: 16 mg/dL (ref 6–23)
CHLORIDE: 98 meq/L (ref 96–112)
CO2: 24 mEq/L (ref 19–32)
Calcium: 9.6 mg/dL (ref 8.4–10.5)
Creatinine, Ser: 1.14 mg/dL (ref 0.40–1.20)
GFR: 49.76 mL/min — AB (ref >60.00)
Glucose, Bld: 104 mg/dL — ABNORMAL HIGH (ref 70–99)
POTASSIUM: 4.8 meq/L (ref 3.5–5.1)
SODIUM: 133 meq/L — AB (ref 135–145)

## 2018-08-13 LAB — TSH: TSH: 1.56 u[IU]/mL (ref 0.35–4.50)

## 2018-08-18 ENCOUNTER — Other Ambulatory Visit: Payer: Self-pay

## 2018-08-18 ENCOUNTER — Other Ambulatory Visit: Payer: Self-pay | Admitting: Family Medicine

## 2018-08-18 MED ORDER — DIPHENOXYLATE-ATROPINE 2.5-0.025 MG PO TABS: 1.0000 | ORAL_TABLET | Freq: Four times a day (QID) | ORAL | 3 refills | Status: DC | PRN

## 2018-08-18 NOTE — Telephone Encounter (Signed)
See note

## 2018-08-18 NOTE — Telephone Encounter (Signed)
Please see msg

## 2018-08-18 NOTE — Telephone Encounter (Signed)
Copied from [place] [phone]. Topic: Quick Communication - See Telephone Encounter >> Aug 18, 2018 11:05 AM Ivar Drape wrote: CRM for notification. See Telephone encounter for: 08/18/18. Patient would like a refill on her diphenoxylate-atropine (LOMOTIL) 2.5-0.025 MG tablet medication and have it sent to her preferred pharmacy [facility] on [address] in [place].

## 2018-08-26 ENCOUNTER — Ambulatory Visit: Payer: Self-pay

## 2018-08-26 NOTE — Telephone Encounter (Signed)
Patient called in with c/o "back pain." She says "I have been moving and I fell earlier today, stumbled over some things. A couple of hours after that, my lower back started hurting. I have chronic back problems anyway and I would like to ask Aldona Bar to prescribe me a muscle relaxer. The pain was a 8, but now that I'm lying down, it is a 6-7. It's not radiating anywhere, not sciatica." I asked about other symptoms, neurologic symptoms, she denies. According to protocol, home care advice. I advised I will send this to [facility] for review and someone will call back with her recommendation, patient verbalized understanding.  Reason for Disposition . Caused by a twisting, bending, or lifting injury  Answer Assessment - Initial Assessment Questions 1. ONSET: "When did the pain begin?"      Today a couple of hours after falling 2. LOCATION: "Where does it hurt?" (upper, mid or lower back)     Lower back 3. SEVERITY: "How bad is the pain?"  (e.g., Scale 1-10; mild, moderate, or severe)   - MILD (1-3): doesn't interfere with normal activities    - MODERATE (4-7): interferes with normal activities or awakens from sleep    - SEVERE (8-10): excruciating pain, unable to do any normal activities      8 earlier today; right now I'm lying down a 6-7 4. PATTERN: "Is the pain constant?" (e.g., yes, no; constant, intermittent)      Constant 5. RADIATION: "Does the pain shoot into your legs or elsewhere?"     No 6. CAUSE:  "What do you think is causing the back pain?"      Lifting, falling, muscle strain 7. BACK OVERUSE:  "Any recent lifting of heavy objects, strenuous work or exercise?"     Yes, I've been moving  8. MEDICATIONS: "What have you taken so far for the pain?" (e.g., nothing, acetaminophen, NSAIDS)     N/A 9. NEUROLOGIC SYMPTOMS: "Do you have any weakness, numbness, or problems with bowel/bladder control?"     No 10. OTHER SYMPTOMS: "Do you have any other symptoms?" (e.g., fever, abdominal pain,  burning with urination, blood in urine)       No 11. PREGNANCY: "Is there any chance you are pregnant?" (e.g., yes, no; LMP)       No  Protocols used: BACK PAIN-A-AH

## 2018-08-27 MED ORDER — CYCLOBENZAPRINE HCL 10 MG PO TABS: 10.0000 mg | ORAL_TABLET | Freq: Three times a day (TID) | ORAL | 1 refills | Status: DC | PRN

## 2018-08-27 NOTE — Telephone Encounter (Signed)
Called and left detailed voicemail message on pt's cell phone.  Dr. Rogers Blocker sent in rx for muscle relaxer.  Gave precautions for drowsiness.  Advised follow up appt w/Dr. Rogers Blocker is she does not improve.

## 2018-08-27 NOTE — Telephone Encounter (Signed)
Please see msg and advise.  

## 2018-08-27 NOTE — Telephone Encounter (Signed)
I'm not sure why this was sent to me... I think it needs to go to [facility]?

## 2018-08-27 NOTE — Telephone Encounter (Signed)
Sent in muscle relaxer for her. Caution drowsiness. If not better, come in for appointment. Merry christmas! aw

## 2018-08-27 NOTE — Telephone Encounter (Signed)
See note

## 2018-08-27 NOTE — Telephone Encounter (Signed)
Please advise 

## 2018-08-30 ENCOUNTER — Ambulatory Visit: Payer: Medicare Other | Admitting: Cardiology

## 2018-09-16 ENCOUNTER — Other Ambulatory Visit: Payer: Self-pay | Admitting: Family Medicine

## 2018-09-16 ENCOUNTER — Ambulatory Visit: Payer: Self-pay | Admitting: *Deleted

## 2018-09-16 MED ORDER — VALACYCLOVIR HCL 1 G PO TABS: 1000.0000 mg | ORAL_TABLET | Freq: Three times a day (TID) | ORAL | 0 refills | Status: DC

## 2018-09-16 NOTE — Telephone Encounter (Signed)
Contacted pt regarding symptoms; she states that she has shingles again; they are located at about dermatone T8 anteriorally; she noticed this on 09/15/18; the pt says that has itching and tingling; nurse triage initiated; recommendations made per protocol;  the pt is not able to come to an appointment; she would like something called in to [place] [phone]) [address] [facility]; she is normally seen by Dr Orma Flaming, [place]; the pt can be contacted at [phone] and a detailed message can be left; notified [place] of this request, will route to office for final disposition.   Reason for Disposition . [1] Shingles rash (matches SYMPTOMS) AND [2] onset within past 72 hours  Answer Assessment - Initial Assessment Questions 1. APPEARANCE of RASH: "Describe the rash."      Pink, raised, no blisters but cracks in the skin 2. LOCATION: "Where is the rash located?"      Dermatone T8 anteriorally 3. ONSET: "When did the rash start?"      09/15/18 4. ITCHING: "Does the rash itch?" If so, ask: "How bad is the itch?"  (Scale 1-10; or mild, moderate, severe)     Itch rated 4 out of 10 but increasing 5. PAIN: "Does the rash hurt?" If so, ask: "How bad is the pain?"  (Scale 1-10; or mild, moderate, severe)     no 6. OTHER SYMPTOMS: "Do you have any other symptoms?" (e.g., fever)     no 7. PREGNANCY: "Is there any chance you are pregnant?" "When was your last menstrual period?"     no  Protocols used: [facility]

## 2018-09-16 NOTE — Telephone Encounter (Signed)
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 She will take tid. Ibuprofen for pain.

## 2018-09-16 NOTE — Telephone Encounter (Signed)
Please see msg and advise.  

## 2018-09-16 NOTE — Progress Notes (Signed)
Px for shingles.

## 2018-09-16 NOTE — Telephone Encounter (Signed)
See note

## 2018-09-27 ENCOUNTER — Other Ambulatory Visit: Payer: Self-pay | Admitting: Obstetrics & Gynecology

## 2018-09-27 DIAGNOSIS — E2839 Other primary ovarian failure: Secondary | ICD-10-CM

## 2018-09-27 DIAGNOSIS — Z1231 Encounter for screening mammogram for malignant neoplasm of breast: Secondary | ICD-10-CM

## 2018-09-30 ENCOUNTER — Encounter: Payer: Self-pay | Admitting: Family Medicine

## 2018-09-30 DIAGNOSIS — E785 Hyperlipidemia, unspecified: Secondary | ICD-10-CM | POA: Insufficient documentation

## 2018-10-01 ENCOUNTER — Encounter: Payer: Self-pay | Admitting: Cardiology

## 2018-10-01 ENCOUNTER — Ambulatory Visit (INDEPENDENT_AMBULATORY_CARE_PROVIDER_SITE_OTHER): Payer: Medicare Other | Admitting: Cardiology

## 2018-10-01 VITALS — BP 138/72 | HR 63 | Ht 65.0 in | Wt 178.0 lb

## 2018-10-01 DIAGNOSIS — R0789 Other chest pain: Secondary | ICD-10-CM

## 2018-10-01 DIAGNOSIS — I351 Nonrheumatic aortic (valve) insufficiency: Secondary | ICD-10-CM

## 2018-10-01 DIAGNOSIS — I340 Nonrheumatic mitral (valve) insufficiency: Secondary | ICD-10-CM | POA: Diagnosis not present

## 2018-10-01 DIAGNOSIS — I10 Essential (primary) hypertension: Secondary | ICD-10-CM | POA: Diagnosis not present

## 2018-10-01 NOTE — Patient Instructions (Signed)
Medication Instructions:  Your physician recommends that you continue on your current medications as directed. Please refer to the Current Medication list given to you today.  If you need a refill on your cardiac medications before your next appointment, please call your pharmacy.   Lab work: -None If you have labs (blood work) drawn today and your tests are completely normal, you will receive your results only by: . MyChart Message (if you have MyChart) OR . A paper copy in the mail If you have any lab test that is abnormal or we need to change your treatment, we will call you to review the results.  Testing/Procedures: -None  Follow-Up: At [facility], you and your health needs are our priority.  As part of our continuing mission to provide you with exceptional heart care, we have created designated Provider Care Teams.  These Care Teams include your primary Cardiologist (physician) and Advanced Practice Providers (APPs -  Physician Assistants and Nurse Practitioners) who all work together to provide you with the care you need, when you need it. You will need a follow up appointment in:  1 years.  Please call our office 2 months in advance to schedule this appointment.  You may see Michael Cooper, MD or one of the following Advanced Practice Providers on your designated Care Team: Scott Weaver, PA-C Vin Bhagat, PA-C . Janine Hammond, NP  Any Other Special Instructions Will Be Listed Below (If Applicable).    

## 2018-10-01 NOTE — Progress Notes (Signed)
Cardiology Office Note:    Date:  10/01/2018   ID:  Mikhayla, Katrina Benitez 28, 1947, MRN [id number]  PCP:  Orma Flaming, MD  Cardiologist:  Sherren Mocha, MD  Referring MD: Tonia Ghent, MD   Chief Complaint  Patient presents with  . Follow-up    chest pain    History of Present Illness:    Katrina Benitez is a 73 y.o. female with a past medical history significant for hypertension, GERD, SIADH, idiopathic peripheral neuropathy, vitamin D deficiency and hx of shingles and postherpetic neuralgia.  Patient was seen in consultation in the hospital in 01/2016 by Dr. Burt Knack for complaints of chest pain.  Patient had normal stress test in 01/2016.  Echo in 04/2017 showed normal EF of 40-98%, grade 1 diastolic dysfunction, mild AR, mildly dilated ascending aorta, mild MR.  Katrina Benitez is here today for annual follow-up. She is not feeling well with a cough and thinks she has bronchitis. She is taking Robitussin and Advil. She still has occ chest pain. She went to an ED in [facility] last year and was told that it was post herpetic. She is now treated with gabapentin that has helped.  She has hx of shingles and post herpetic neuralgia. She has had 2 episodes of shingles since then. She has been under a lot of stress lately. She is moving. She had a rash with itching on multiple parts of her body since September. Milder now but still bothers her. She is treated with hydroxyzine. She took some Xanax which helped the most but she is out.   She exercises but not as much lately with her moving. She walks and has a treadmill. No exertional chest discomfort or shortness of breath. She notes some shortness of breath doing little thinkgs over the last couple of weeks.  She is a retired Engineer, drilling.   Past Medical History:  Diagnosis Date  . Allergy   . Anemia   . Anxiety   . Arthritis    djd  . Cardiac arrhythmia   . Depression   . Diverticulitis   . Diverticulosis   . GERD (gastroesophageal reflux  disease)   . Heart murmur   . HTN (hypertension)   . Infertility, female   . Neuromuscular disorder ([place])    idiopathic peripheral  neuropathy  . Peripheral neuropathy   . Positive PPD   . SIADH (syndrome of inappropriate ADH production) ([place])     Past Surgical History:  Procedure Laterality Date  . BREAST BIOPSY Left   . BREAST EXCISIONAL BIOPSY Left    benign  . CERVICAL BIOPSY  W/ LOOP ELECTRODE EXCISION    . CERVICAL POLYPECTOMY  02/2016  . COLONOSCOPY    . COLPOSCOPY    . DILATION AND CURETTAGE OF UTERUS    . HYSTEROSCOPY    . LUMBAR DISC SURGERY    . LUMBAR LAMINECTOMY    . TONSILLECTOMY    . UPPER GASTROINTESTINAL ENDOSCOPY      Current Medications: No outpatient medications have been marked as taking for the 10/01/18 encounter (Office Visit) with Daune Perch, NP.     Allergies:   Fentanyl; Ciprofloxacin; Dilaudid [hydromorphone hcl]; Flagyl [metronidazole]; and Visipaque [iodixanol]   Social History   Socioeconomic History  . Marital status: Married    Spouse name: Jenny Reichmann  . Number of children: 0  . Years of education: College  . Highest education level: Not on file  Occupational History  . Occupation: retired Engineer, drilling  Social  Needs  . Financial resource strain: Not on file  . Food insecurity:    Worry: Not on file    Inability: Not on file  . Transportation needs:    Medical: Not on file    Non-medical: Not on file  Tobacco Use  . Smoking status: Never Smoker  . Smokeless tobacco: Never Used  Substance and Sexual Activity  . Alcohol use: Yes    Alcohol/week: 14.0 standard drinks    Types: 14 Glasses of wine per week  . Drug use: No  . Sexual activity: Yes    Birth control/protection: Post-menopausal  Lifestyle  . Physical activity:    Days per week: Not on file    Minutes per session: Not on file  . Stress: Not on file  Relationships  . Social connections:    Talks on phone: Not on file    Gets together: Not on file    Attends  religious service: Not on file    Active member of club or organization: Not on file    Attends meetings of clubs or organizations: Not on file    Relationship status: Not on file  Other Topics Concern  . Not on file  Social History Narrative   Lives at home with her husband.   Right-handed.   1-2 cups caffeine per day.     Family History: The patient's family history includes Breast cancer in her mother; Depression in her brother and father; Early death in her father; Glaucoma in her mother; Heart attack in her father; Heart disease in her father; Hypertension in her brother; Miscarriages / [place] in her mother; Prostate cancer in her paternal grandfather; Tuberculosis in her paternal grandmother. There is no history of Colon cancer, Esophageal cancer, Rectal cancer, or Stomach cancer. ROS:   Please see the history of present illness.     All other systems reviewed and are negative.  EKGs/Labs/Other Studies Reviewed:    The following studies were reviewed today:  Echocardiogram 05/19/2017 Study Conclusions - Left ventricle: The cavity size was normal. Wall thickness was   normal. Systolic function was normal. The estimated ejection   fraction was in the range of 60% to 65%. Wall motion was normal;   there were no regional wall motion abnormalities. Doppler   parameters are consistent with abnormal left ventricular   relaxation (grade 1 diastolic dysfunction). - Aortic valve: Trileaflet; mildly calcified leaflets. There was no   stenosis. There was mild regurgitation. - Aorta: Ascending aortic diameter: 41 mm (S). - Ascending aorta: The ascending aorta was mildly dilated. - Mitral valve: There was mild regurgitation. - Left atrium: The atrium was mildly dilated. - Right ventricle: The cavity size was normal. Systolic function   was normal. - Right atrium: The atrium was mildly dilated. - Tricuspid valve: Peak RV-RA gradient (S): 29 mm Hg. - Pulmonary arteries: PA peak  pressure: 32 mm Hg (S). - Inferior vena cava: The vessel was normal in size. The   respirophasic diameter changes were in the normal range (>= 50%),   consistent with normal central venous pressure.  Impressions: - Normal LV size with EF 60-65%. Normal RV size and systolic   function. Mild aortic insufficiency. Mild mitral regurgitation.  Nuclear stress test 01/22/2016 IMPRESSION: 1. No reversible ischemia or infarction. 2. Normal left ventricular wall motion. 3. Left ventricular ejection fraction 66% 4. Low-risk stress test findings*.   EKG:  EKG is ordered today.  The ekg ordered today demonstrates NSR, no abnormalities  Recent  Labs: 07/13/2018: ALT 16 08/12/2018: BUN 16; Creatinine, Ser 1.14; Hemoglobin 12.2; Platelets 280.0; Potassium 4.8; Sodium 133; TSH 1.56   Recent Lipid Panel    Component Value Date/Time   CHOL 241 (H) 08/12/2018 1629   TRIG 82.0 08/12/2018 1629   HDL 106.70 08/12/2018 1629   CHOLHDL 2 08/12/2018 1629   VLDL 16.4 08/12/2018 1629   LDLCALC 118 (H) 08/12/2018 1629    Physical Exam:    VS:  BP 138/72   Pulse 63   Ht 5\' 5"  (1.651 m)   Wt 178 lb (80.7 kg)   LMP  (LMP Unknown)   BMI 29.62 kg/m     Wt Readings from Last 3 Encounters:  10/01/18 178 lb (80.7 kg)  08/12/18 183 lb (83 kg)  07/13/18 194 lb (88 kg)     Physical Exam  Constitutional: She is oriented to person, place, and time. She appears well-developed and well-nourished. No distress.  HENT:  Head: Normocephalic and atraumatic.  Neck: Normal range of motion. Neck supple. No JVD present.  Cardiovascular: Normal rate and regular rhythm.  Murmur heard.  Systolic murmur is present with a grade of 1/6 at the upper right sternal border. Pulmonary/Chest: Effort normal and breath sounds normal. No respiratory distress. She has no wheezes. She has no rales.  Abdominal: Soft. Bowel sounds are normal.  Musculoskeletal: Normal range of motion.        General: No deformity or edema.    Neurological: She is alert and oriented to person, place, and time.  Skin: Skin is warm and dry.  Psychiatric: She has a normal mood and affect. Her behavior is normal. Judgment and thought content normal.  Vitals reviewed.    ASSESSMENT:    1. Other chest pain   2. Essential hypertension   3. Aortic valve insufficiency, etiology of cardiac valve disease unspecified, mild   4. Mitral valve insufficiency, unspecified etiology, mild    PLAN:    In order of problems listed above:  Chest pain -Still has intermittent mild chest pain, has been diagnosed with postherpetic neuralgia. Better with gabapentin. No exertional chest pain or shortness of breath.   Hypertension: BP well controlled on small dose of BB.   Aortic insufficiency: Mild by echo in 04/2017, asymptomatic.   Mitral insufficiency: Mild by echo in 04/2017  Pt wants to continue to follow with Dr. Burt Knack. Will return in 1 year.    Medication Adjustments/Labs and Tests Ordered: Current medicines are reviewed at length with the patient today.  Concerns regarding medicines are outlined above. Labs and tests ordered and medication changes are outlined in the patient instructions below:  Patient Instructions  Medication Instructions:  Your physician recommends that you continue on your current medications as directed. Please refer to the Current Medication list given to you today.  If you need a refill on your cardiac medications before your next appointment, please call your pharmacy.   Lab work: None  If you have labs (blood work) drawn today and your tests are completely normal, you will receive your results only by: Marland Kitchen MyChart Message (if you have MyChart) OR . A paper copy in the mail If you have any lab test that is abnormal or we need to change your treatment, we will call you to review the results.  Testing/Procedures: None  Follow-Up: At [facility], you and your health needs are our priority.  As part of  our continuing mission to provide you with exceptional heart care, we have created designated  Provider Care Teams.  These Care Teams include your primary Cardiologist (physician) and Advanced Practice Providers (APPs -  Physician Assistants and Nurse Practitioners) who all work together to provide you with the care you need, when you need it. You will need a follow up appointment in:  1 years.  Please call our office 2 months in advance to schedule this appointment.  You may see Sherren Mocha, MD or one of the following Advanced Practice Providers on your designated Care Team: Richardson Dopp, PA-C [place], [place] . Daune Perch, NP  Any Other Special Instructions Will Be Listed Below (If Applicable).       Signed, Daune Perch, NP  10/01/2018 2:30 PM    [place]

## 2018-10-05 ENCOUNTER — Encounter

## 2018-10-05 ENCOUNTER — Encounter: Payer: Medicare Other | Admitting: Obstetrics & Gynecology

## 2018-10-11 ENCOUNTER — Ambulatory Visit: Payer: Self-pay

## 2018-10-11 ENCOUNTER — Other Ambulatory Visit: Payer: Self-pay | Admitting: Family Medicine

## 2018-10-11 ENCOUNTER — Telehealth: Payer: Self-pay | Admitting: Family Medicine

## 2018-10-11 NOTE — Telephone Encounter (Signed)
See note

## 2018-10-11 NOTE — Telephone Encounter (Signed)
Order has already been pended at office for provider review.

## 2018-10-11 NOTE — Telephone Encounter (Unsigned)
Copied from [place] [id number]. Topic: General - Other >> Oct 11, 2018 12:29 PM Yvette Rack wrote: Reason for CRM: Pt requests that Dr. Rogers Blocker nurse return her call. Pt stated that she needs to speak with the nurse regarding general information. Cb# [phone]

## 2018-10-11 NOTE — Telephone Encounter (Signed)
Called and talked to Dr. Clydene Laming. Answered all of her questions. Refilled ambien as well.

## 2018-10-11 NOTE — Telephone Encounter (Unsigned)
Copied from [place] [phone]. Topic: Quick Communication - Rx Refill/Question >> Oct 11, 2018 12:27 PM Yvette Rack wrote: Medication: zolpidem (AMBIEN) 10 MG tablet  Has the patient contacted their pharmacy?  no  Preferred Pharmacy (with phone number or street name): [place], [place] - [address] [phone] (Phone)  [phone] (Fax)  Agent: Please be advised that RX refills may take up to 3 business days. We ask that you follow-up with your pharmacy.

## 2018-10-11 NOTE — Telephone Encounter (Signed)
      Pt states she want to talk with Dr. Rogers Blocker or her primary nurse.  Will not discuss issue with triage nurse.  Protocols used: INFORMATION ONLY CALL-A-AH  Answer Assessment - Initial Assessment Questions 1. REASON FOR CALL or QUESTION: "What is your reason for calling today?" or "How can I best help you?" or "What question do you have that I can help answer?"     Pt states she want to talk with Dr. Rogers Blocker or her primary nurse.  Will not discuss issue with triage nurse.  Protocols used: INFORMATION ONLY CALL-A-AH

## 2018-10-20 ENCOUNTER — Other Ambulatory Visit: Payer: Self-pay | Admitting: Family Medicine

## 2018-10-25 ENCOUNTER — Telehealth: Payer: Self-pay | Admitting: Family Medicine

## 2018-10-25 ENCOUNTER — Other Ambulatory Visit (INDEPENDENT_AMBULATORY_CARE_PROVIDER_SITE_OTHER): Payer: Medicare Other

## 2018-10-25 DIAGNOSIS — E222 Syndrome of inappropriate secretion of antidiuretic hormone: Secondary | ICD-10-CM

## 2018-10-25 LAB — BASIC METABOLIC PANEL
BUN: 17 mg/dL (ref 6–23)
CALCIUM: 9.4 mg/dL (ref 8.4–10.5)
CO2: 30 mEq/L (ref 19–32)
Chloride: 96 mEq/L (ref 96–112)
Creatinine, Ser: 0.84 mg/dL (ref 0.40–1.20)
GFR: 66.55 mL/min (ref >60.00)
Glucose, Bld: 91 mg/dL (ref 70–99)
Potassium: 5.7 mEq/L — ABNORMAL HIGH (ref 3.5–5.1)
Sodium: 133 mEq/L — ABNORMAL LOW (ref 135–145)

## 2018-10-25 NOTE — Telephone Encounter (Signed)
Copied from [place] [phone]. Topic: Quick Communication - See Telephone Encounter >> Oct 25, 2018  1:50 PM Blase Mess A wrote: CRM for notification. See Telephone encounter for: 10/25/18.  Patient is calling because she had a metabollic labs drawn today.  Requesting Vit D levels to tested.  Requesting order.  Patient had the labs done at [facility] Please advise [phone]

## 2018-10-25 NOTE — Telephone Encounter (Signed)
See note

## 2018-10-25 NOTE — Telephone Encounter (Signed)
Please see message and advise 

## 2018-10-27 NOTE — Telephone Encounter (Signed)
Not able to do.

## 2018-11-09 ENCOUNTER — Other Ambulatory Visit: Payer: Self-pay | Admitting: Family Medicine

## 2018-11-09 ENCOUNTER — Telehealth: Payer: Self-pay | Admitting: Family Medicine

## 2018-11-09 NOTE — Telephone Encounter (Signed)
Copied from [place] [phone]. Topic: Quick Communication - Rx Refill/Question >> Nov 09, 2018  8:02 AM Scherrie Gerlach wrote: Medication: valACYclovir (VALTREX) 1000 MG tablet  Pt states she has shingles again, dermatomes t2. Pt states she needs this Rx asap, because she needs to start med in order to help.  [place], [place] - [address] [phone] (Phone) [phone] (Fax)

## 2018-11-10 DIAGNOSIS — D692 Other nonthrombocytopenic purpura: Secondary | ICD-10-CM | POA: Diagnosis not present

## 2018-11-10 DIAGNOSIS — L2089 Other atopic dermatitis: Secondary | ICD-10-CM | POA: Diagnosis not present

## 2018-11-10 NOTE — Telephone Encounter (Signed)
Already sent in for her.

## 2018-11-15 ENCOUNTER — Other Ambulatory Visit: Payer: Medicare Other

## 2018-11-15 ENCOUNTER — Ambulatory Visit: Payer: Medicare Other

## 2018-12-03 ENCOUNTER — Encounter: Payer: Self-pay | Admitting: Family Medicine

## 2018-12-03 ENCOUNTER — Ambulatory Visit (INDEPENDENT_AMBULATORY_CARE_PROVIDER_SITE_OTHER): Payer: Medicare Other | Admitting: Family Medicine

## 2018-12-03 ENCOUNTER — Telehealth: Payer: Self-pay | Admitting: Family Medicine

## 2018-12-03 ENCOUNTER — Other Ambulatory Visit: Payer: Self-pay

## 2018-12-03 VITALS — BP 123/73 | HR 93 | Temp 98.2°F | Resp 16

## 2018-12-03 DIAGNOSIS — T7840XA Allergy, unspecified, initial encounter: Secondary | ICD-10-CM

## 2018-12-03 DIAGNOSIS — R454 Irritability and anger: Secondary | ICD-10-CM

## 2018-12-03 DIAGNOSIS — E559 Vitamin D deficiency, unspecified: Secondary | ICD-10-CM | POA: Diagnosis not present

## 2018-12-03 NOTE — Telephone Encounter (Signed)
Made in error

## 2018-12-03 NOTE — Progress Notes (Signed)
OFFICE VISIT  12/03/2018   CC:  Chief Complaint  Patient presents with  . Allergies    wants Depo 80mg  IM injection  . Labs    BMP and Vit D   HPI:    Patient is a 73 y.o. Caucasian female who I am seeing today due to her primary provider not being available.  Pt presents for "depomedrol 80 mg injection and vit D level recheck for her dx of vit D deficiency (also BMET check for her dx of SIADH). I asked patient her symptoms and she really didn't care to give me any specifics.  She just stated that this is her routine every spring--"to come and get a depomedrol injection for my allergies".  I asked her about any trials of antihistamines, nasal steroids, etc but she looked at me a bit incredulously and said she didn't anticipate this much trouble with this visit.  I told her that I am a doctor and is my job to ask questions to make sure that treatments and tests patient's request are appropriate.  I asked if this steroid injection was her routine with her current PCP, Dr. Rogers Blocker, and pt stated she had only seen Dr. Rogers Blocker one time.  I reviewed the note from that visit (08/12/18) and there is no mention of any allergy or allergy treatment in that note.   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  I asked her "if someone came to you and told you what test they wanted, wouldn't you ask questions to make sure what was going on with them in order to make sure YOU (AS THEIR PHYSICIAN) agreed whether or not the requested test was appropriate?  She then acted disgusted, said that if this was the way she was going to be treated here then she is "done".  I told her that was fine and said she could leave.  She stood up and left the office.   We never discussed anything regarding her desire to get vit D check or BMET check.  Of note, our front office administrator said the patient was acting  short and put out about things when checking in: she apparently didn't like being asked our routine check in questions.  She refused to be weighed when the CMA was checking her in. After our interaction today, I called my office manager and told her about the interaction.  Past Medical History:  Diagnosis Date  . Allergy   . Anemia   . Anxiety   . Arthritis    djd  . Cardiac arrhythmia   . Depression   . Diverticulitis   . Diverticulosis   . GERD (gastroesophageal reflux disease)   . Heart murmur   . HTN (hypertension)   . Infertility, female   . Neuromuscular disorder ([place])    idiopathic peripheral  neuropathy  . Peripheral neuropathy   . Positive PPD   . SIADH (syndrome of inappropriate ADH production) ([place])     Past Surgical History:  Procedure Laterality Date  . BREAST BIOPSY Left   . BREAST EXCISIONAL BIOPSY Left    benign  . CERVICAL BIOPSY  W/ LOOP ELECTRODE EXCISION    . CERVICAL POLYPECTOMY  02/2016  . COLONOSCOPY    . COLPOSCOPY    . DILATION AND CURETTAGE OF UTERUS    . HYSTEROSCOPY    . LUMBAR  [place] [facility]GERY    . LUMBAR LAMINECTOMY    . TONSILLECTOMY    . UPPER GASTROINTESTINAL ENDOSCOPY      Outpatient Medications Prior to Visit  Medication Sig Dispense Refill  . betamethasone dipropionate (DIPROLENE) 0.05 % cream Apply topically daily as needed.    . diazepam (VALIUM) 10 MG tablet TAKE ONE TABLET DAILY AS NEEDED 90 tablet 0  . diphenoxylate-atropine (LOMOTIL) 2.5-0.025 MG tablet TAKE ONE (1) TABLET FOUR TIMES EACH DAY AS NEEDED 45 tablet 1  . estradiol (ESTRACE) 0.1 MG/GM vaginal cream Insert 1 gm vaginally qhs x 14 nights, then twice weekly    . gabapentin (NEURONTIN) 600 MG tablet Take 1 tablet (600 mg total) by mouth 3 (three) times daily. 270 tablet 1  . hydrOXYzine (ATARAX/VISTARIL) 25 MG tablet Take 1 tablet (25 mg total) by mouth 3 (three) times daily as needed. 90 tablet 1  . ibuprofen (ADVIL,MOTRIN) 200 MG tablet Take by mouth daily as needed.     . ondansetron (ZOFRAN) 8 MG tablet Take by mouth daily as needed.    . Probiotic Product (ALIGN PO) Take by mouth daily.    Marland Kitchen zolpidem (AMBIEN) 10 MG tablet TAKE ONE TABLET AT BEDTIME AS NEEDED 90 tablet 0  . citalopram (CELEXA) 20 MG tablet Take 1 tablet (20 mg total) by mouth daily. (Patient not taking: Reported on 12/03/2018) 30 tablet 3  . cyclobenzaprine (FLEXERIL) 10 MG tablet Take 1 tablet (10 mg total) by mouth 3 (three) times daily as needed for muscle spasms. (Patient not taking: Reported on 12/03/2018) 30 tablet 1  . diphenoxylate-atropine (LOMOTIL) 2.5-0.025 MG tablet Take 1 tablet by mouth 4 (four) times daily as needed for diarrhea or loose stools. Reported on 10/04/2015 (Patient not taking: Reported on 12/03/2018) 30 tablet 3  . metoprolol succinate (TOPROL-XL) 25 MG 24 hr tablet Take 1 tablet (25 mg total) by mouth daily. (Patient not taking: Reported on 12/03/2018) 30 tablet 3  . valACYclovir (VALTREX) 1000 MG tablet TAKE ONE TABLET THREE TIMES DAILY (Patient not taking: Reported on 12/03/2018) 21 tablet 0  . Vitamin D, Ergocalciferol, (DRISDOL) 50000 units CAPS capsule Take 50,000 Units by mouth every 7 (seven) days.     No facility-administered medications prior to visit.     Allergies  Allergen Reactions  . Fentanyl Itching  . Ciprofloxacin Hives  . Dilaudid [Hydromorphone Hcl] Hives  . Flagyl [Metronidazole] Hives  . Visipaque [Iodixanol] Swelling    Throat swelling    ROS As per HPI  PE: Blood pressure 123/73, pulse 93, temperature 98.2 F (36.8 C), temperature source Oral, resp. rate 16, SpO2 97 %. Gen: Alert, well appearing.  Patient is oriented to person, place, time, and situation. AFFECT: lucid thought and speech. No further exam today.   LABS:   Vit D level 06/2018 was 25.    Chemistry      Component Value Date/Time   NA 133 (L) 10/25/2018 1301   NA 132 (L) 07/13/2018 1323   K 5.7 (H) 10/25/2018 1301   CL 96 10/25/2018 1301   CO2 30 10/25/2018 1301    BUN 17 10/25/2018 1301   BUN 16 07/13/2018 1323   CREATININE 0.84 10/25/2018 1301   GLU 96 06/01/2018      Component Value Date/Time   CALCIUM 9.4 10/25/2018 1301   ALKPHOS 108 07/13/2018 1323   AST 21 07/13/2018 1323   ALT 16 07/13/2018 1323   BILITOT 0.3 07/13/2018 1323     Lab Results  Component Value  Date   WBC 5.0 08/12/2018   HGB 12.2 08/12/2018   HCT 35.3 (L) 08/12/2018   MCV 94.2 08/12/2018   PLT 280.0 08/12/2018    IMPRESSION AND PLAN:  Pt encounter in which pt did not want to cooperate with the general evaluation process, was acting with a sense of entitlement, and abruptly chose to leave the room/encounter and leave the office.  On her way out, she told our front office administrator that I was a "creep". Needless to say, this patient is NOT welcome to be seen at this office again unless under emergency circumstances.  An After Visit Summary was printed and given to the patient.  FOLLOW UP: none  Signed:  Crissie Sickles, MD           12/03/2018

## 2018-12-16 ENCOUNTER — Other Ambulatory Visit: Payer: Medicare Other

## 2018-12-16 ENCOUNTER — Ambulatory Visit: Payer: Medicare Other

## 2019-01-05 DIAGNOSIS — L308 Other specified dermatitis: Secondary | ICD-10-CM | POA: Diagnosis not present

## 2019-01-11 ENCOUNTER — Ambulatory Visit: Payer: Medicare Other

## 2019-01-13 ENCOUNTER — Other Ambulatory Visit: Payer: Self-pay | Admitting: Family Medicine

## 2019-01-13 ENCOUNTER — Telehealth: Payer: Self-pay | Admitting: Family Medicine

## 2019-01-13 NOTE — Telephone Encounter (Signed)
Copied from [place] [id number]. Topic: Quick Communication - See Telephone Encounter >> Jan 13, 2019  4:05 PM Loma Boston wrote: CRM for notification. See Telephone encounter for: 01/13/19.zolpidem (AMBIEN) 10 MG tablet needs a refill/ [facility] - [place], [place] - [address] [phone] (Phone) [phone] (Fax) also she is very concerned that her sodium levels have not been checked in several months, was every 30 days, should she take the risk to come out, and if so she wanted to add vitiamn D to the lab request? If a nurse could call that would be great.

## 2019-01-14 ENCOUNTER — Other Ambulatory Visit: Payer: Self-pay | Admitting: Family Medicine

## 2019-01-14 DIAGNOSIS — E559 Vitamin D deficiency, unspecified: Secondary | ICD-10-CM

## 2019-01-14 DIAGNOSIS — E222 Syndrome of inappropriate secretion of antidiuretic hormone: Secondary | ICD-10-CM

## 2019-01-14 MED ORDER — ZOLPIDEM TARTRATE 10 MG PO TABS: 10.0000 mg | ORAL_TABLET | Freq: Every evening | ORAL | 0 refills | Status: DC | PRN

## 2019-01-14 NOTE — Telephone Encounter (Signed)
Let her know she can come in today for lab work. I believe the risk is far less here than at the grocery store and we can't just neglect chronic health issues. We will have her wear a mask. Labs are in including vitamin D and I refilled her medication.  Hope she is well.  Aw

## 2019-01-14 NOTE — Telephone Encounter (Signed)
Tried to contact pt no answer just rings and no machine now.

## 2019-01-14 NOTE — Telephone Encounter (Signed)
See request °

## 2019-01-14 NOTE — Telephone Encounter (Signed)
Tried to contact pt unable to leave message but voicemail box is full. Will try again later.

## 2019-01-19 ENCOUNTER — Encounter (HOSPITAL_COMMUNITY): Payer: Self-pay | Admitting: Emergency Medicine

## 2019-01-19 ENCOUNTER — Inpatient Hospital Stay (HOSPITAL_COMMUNITY)
Admission: EM | Admit: 2019-01-19 | Discharge: 2019-01-21 | DRG: 308 | Disposition: A | Discharge status: Home / Self Care | Payer: Medicare Other | Attending: Family Medicine | Admitting: Family Medicine

## 2019-01-19 ENCOUNTER — Emergency Department (HOSPITAL_COMMUNITY): Payer: Medicare Other

## 2019-01-19 ENCOUNTER — Other Ambulatory Visit: Payer: Self-pay

## 2019-01-19 DIAGNOSIS — I10 Essential (primary) hypertension: Secondary | ICD-10-CM | POA: Diagnosis not present

## 2019-01-19 DIAGNOSIS — R609 Edema, unspecified: Secondary | ICD-10-CM

## 2019-01-19 DIAGNOSIS — Z8249 Family history of ischemic heart disease and other diseases of the circulatory system: Secondary | ICD-10-CM

## 2019-01-19 DIAGNOSIS — Z83511 Family history of glaucoma: Secondary | ICD-10-CM | POA: Diagnosis not present

## 2019-01-19 DIAGNOSIS — Z818 Family history of other mental and behavioral disorders: Secondary | ICD-10-CM

## 2019-01-19 DIAGNOSIS — K219 Gastro-esophageal reflux disease without esophagitis: Secondary | ICD-10-CM | POA: Diagnosis not present

## 2019-01-19 DIAGNOSIS — Z209 Contact with and (suspected) exposure to unspecified communicable disease: Secondary | ICD-10-CM | POA: Diagnosis not present

## 2019-01-19 DIAGNOSIS — J9601 Acute respiratory failure with hypoxia: Secondary | ICD-10-CM | POA: Diagnosis present

## 2019-01-19 DIAGNOSIS — R0602 Shortness of breath: Secondary | ICD-10-CM

## 2019-01-19 DIAGNOSIS — Z20828 Contact with and (suspected) exposure to other viral communicable diseases: Secondary | ICD-10-CM | POA: Diagnosis not present

## 2019-01-19 DIAGNOSIS — G629 Polyneuropathy, unspecified: Secondary | ICD-10-CM | POA: Diagnosis present

## 2019-01-19 DIAGNOSIS — E785 Hyperlipidemia, unspecified: Secondary | ICD-10-CM | POA: Diagnosis present

## 2019-01-19 DIAGNOSIS — K5790 Diverticulosis of intestine, part unspecified, without perforation or abscess without bleeding: Secondary | ICD-10-CM | POA: Diagnosis not present

## 2019-01-19 DIAGNOSIS — F419 Anxiety disorder, unspecified: Secondary | ICD-10-CM | POA: Diagnosis present

## 2019-01-19 DIAGNOSIS — R69 Illness, unspecified: Secondary | ICD-10-CM

## 2019-01-19 DIAGNOSIS — J111 Influenza due to unidentified influenza virus with other respiratory manifestations: Secondary | ICD-10-CM | POA: Diagnosis present

## 2019-01-19 DIAGNOSIS — Z888 Allergy status to other drugs, medicaments and biological substances status: Secondary | ICD-10-CM

## 2019-01-19 DIAGNOSIS — Z791 Long term (current) use of non-steroidal anti-inflammatories (NSAID): Secondary | ICD-10-CM

## 2019-01-19 DIAGNOSIS — G608 Other hereditary and idiopathic neuropathies: Secondary | ICD-10-CM | POA: Diagnosis not present

## 2019-01-19 DIAGNOSIS — B0229 Other postherpetic nervous system involvement: Secondary | ICD-10-CM | POA: Diagnosis present

## 2019-01-19 DIAGNOSIS — Z91041 Radiographic dye allergy status: Secondary | ICD-10-CM | POA: Diagnosis not present

## 2019-01-19 DIAGNOSIS — Z881 Allergy status to other antibiotic agents status: Secondary | ICD-10-CM

## 2019-01-19 DIAGNOSIS — Z7952 Long term (current) use of systemic steroids: Secondary | ICD-10-CM | POA: Diagnosis not present

## 2019-01-19 DIAGNOSIS — R600 Localized edema: Secondary | ICD-10-CM | POA: Diagnosis present

## 2019-01-19 DIAGNOSIS — I4891 Unspecified atrial fibrillation: Secondary | ICD-10-CM | POA: Diagnosis not present

## 2019-01-19 DIAGNOSIS — Z7289 Other problems related to lifestyle: Secondary | ICD-10-CM

## 2019-01-19 DIAGNOSIS — I352 Nonrheumatic aortic (valve) stenosis with insufficiency: Secondary | ICD-10-CM | POA: Diagnosis not present

## 2019-01-19 DIAGNOSIS — N179 Acute kidney failure, unspecified: Secondary | ICD-10-CM | POA: Diagnosis not present

## 2019-01-19 DIAGNOSIS — I959 Hypotension, unspecified: Secondary | ICD-10-CM | POA: Diagnosis not present

## 2019-01-19 DIAGNOSIS — L308 Other specified dermatitis: Secondary | ICD-10-CM | POA: Diagnosis not present

## 2019-01-19 DIAGNOSIS — R000 Tachycardia, unspecified: Secondary | ICD-10-CM | POA: Diagnosis not present

## 2019-01-19 DIAGNOSIS — R05 Cough: Secondary | ICD-10-CM | POA: Diagnosis not present

## 2019-01-19 DIAGNOSIS — I351 Nonrheumatic aortic (valve) insufficiency: Secondary | ICD-10-CM

## 2019-01-19 DIAGNOSIS — Z885 Allergy status to narcotic agent status: Secondary | ICD-10-CM | POA: Diagnosis not present

## 2019-01-19 DIAGNOSIS — Z20822 Contact with and (suspected) exposure to covid-19: Secondary | ICD-10-CM

## 2019-01-19 DIAGNOSIS — I340 Nonrheumatic mitral (valve) insufficiency: Secondary | ICD-10-CM | POA: Diagnosis not present

## 2019-01-19 DIAGNOSIS — J189 Pneumonia, unspecified organism: Secondary | ICD-10-CM | POA: Diagnosis present

## 2019-01-19 DIAGNOSIS — E222 Syndrome of inappropriate secretion of antidiuretic hormone: Secondary | ICD-10-CM | POA: Diagnosis present

## 2019-01-19 DIAGNOSIS — Z79899 Other long term (current) drug therapy: Secondary | ICD-10-CM | POA: Diagnosis not present

## 2019-01-19 DIAGNOSIS — I480 Paroxysmal atrial fibrillation: Principal | ICD-10-CM | POA: Diagnosis present

## 2019-01-19 DIAGNOSIS — IMO0001 Reserved for inherently not codable concepts without codable children: Secondary | ICD-10-CM

## 2019-01-19 DIAGNOSIS — Z789 Other specified health status: Secondary | ICD-10-CM

## 2019-01-19 DIAGNOSIS — J168 Pneumonia due to other specified infectious organisms: Secondary | ICD-10-CM | POA: Diagnosis not present

## 2019-01-19 DIAGNOSIS — Z803 Family history of malignant neoplasm of breast: Secondary | ICD-10-CM

## 2019-01-19 LAB — BASIC METABOLIC PANEL
Anion gap: 12 (ref 5–15)
BUN: 13 mg/dL (ref 8–23)
CO2: 24 mmol/L (ref 22–32)
Calcium: 8.6 mg/dL — ABNORMAL LOW (ref 8.9–10.3)
Chloride: 99 mmol/L (ref 98–111)
Creatinine, Ser: 1.03 mg/dL — ABNORMAL HIGH (ref 0.44–1.00)
GFR calc Af Amer: >60 mL/min (ref >60)
GFR calc non Af Amer: 54 mL/min — ABNORMAL LOW (ref >60)
Glucose, Bld: 104 mg/dL — ABNORMAL HIGH (ref 70–99)
Potassium: 4.3 mmol/L (ref 3.5–5.1)
Sodium: 135 mmol/L (ref 135–145)

## 2019-01-19 LAB — SEDIMENTATION RATE: Sed Rate: 10 mm/hr (ref 0–22)

## 2019-01-19 LAB — CBC
HCT: 36.3 % (ref 36.0–46.0)
Hemoglobin: 12.1 g/dL (ref 12.0–15.0)
MCH: 30 pg (ref 26.0–34.0)
MCHC: 33.3 g/dL (ref 30.0–36.0)
MCV: 90.1 fL (ref 80.0–100.0)
Platelets: 227 10*3/uL (ref 150–400)
RBC: 4.03 MIL/uL (ref 3.87–5.11)
RDW: 14.4 % (ref 11.5–15.5)
WBC: 7.8 10*3/uL (ref 4.0–10.5)
nRBC: 0 % (ref 0.0–0.2)

## 2019-01-19 LAB — RESPIRATORY PANEL BY PCR

## 2019-01-19 LAB — PROCALCITONIN: Procalcitonin: 0.24 ng/mL

## 2019-01-19 LAB — URINALYSIS, ROUTINE W REFLEX MICROSCOPIC
Bilirubin Urine: NEGATIVE
Glucose, UA: NEGATIVE mg/dL
Hgb urine dipstick: NEGATIVE
Ketones, ur: NEGATIVE mg/dL
Leukocytes,Ua: NEGATIVE
Nitrite: NEGATIVE
Protein, ur: NEGATIVE mg/dL
Specific Gravity, Urine: 1.009 (ref 1.005–1.030)
pH: 6 (ref 5.0–8.0)

## 2019-01-19 LAB — TSH
TSH: 1.644 u[IU]/mL (ref 0.350–4.500)
TSH: 1.006 u[IU]/mL (ref 0.350–4.500)

## 2019-01-19 LAB — FERRITIN: Ferritin: 29 ng/mL (ref 11–307)

## 2019-01-19 LAB — FIBRINOGEN: Fibrinogen: 416 mg/dL (ref 210–475)

## 2019-01-19 LAB — D-DIMER, QUANTITATIVE (NOT AT ARMC): D-Dimer, Quant: 0.77 ug/mL-FEU — ABNORMAL HIGH (ref 0.00–0.50)

## 2019-01-19 LAB — TROPONIN I: Troponin I: 0.03 ng/mL (ref <0.03)

## 2019-01-19 LAB — LACTATE DEHYDROGENASE: LDH: 163 U/L (ref 98–192)

## 2019-01-19 LAB — SARS CORONAVIRUS 2 BY RT PCR (HOSPITAL ORDER, PERFORMED IN ~~LOC~~ HOSPITAL LAB): SARS Coronavirus 2: NEGATIVE

## 2019-01-19 LAB — MAGNESIUM: Magnesium: 1.8 mg/dL (ref 1.7–2.4)

## 2019-01-19 LAB — PROTIME-INR
INR: 1 (ref 0.8–1.2)
Prothrombin Time: 12.8 seconds (ref 11.4–15.2)

## 2019-01-19 LAB — MRSA PCR SCREENING: MRSA by PCR: NEGATIVE

## 2019-01-19 LAB — C-REACTIVE PROTEIN: CRP: 4.5 mg/dL — ABNORMAL HIGH (ref <1.0)

## 2019-01-19 LAB — LACTIC ACID, PLASMA
Lactic Acid, Venous: 2 mmol/L (ref 0.5–1.9)
Lactic Acid, Venous: 1.3 mmol/L (ref 0.5–1.9)

## 2019-01-19 LAB — BRAIN NATRIURETIC PEPTIDE: B Natriuretic Peptide: 583.4 pg/mL — ABNORMAL HIGH (ref 0.0–100.0)

## 2019-01-19 LAB — ABO/RH: ABO/RH(D): O POS

## 2019-01-19 MED ORDER — ADULT MULTIVITAMIN W/MINERALS CH: 1.0000 | ORAL_TABLET | Freq: Every day | ORAL | Status: DC

## 2019-01-19 MED ORDER — METOPROLOL TARTRATE 25 MG PO TABS
25.0000 mg | ORAL_TABLET | Freq: Two times a day (BID) | ORAL | Status: DC
  Administered 2019-01-19 – 2019-01-20 (×3): 25 mg via ORAL
  Filled 2019-01-19 (×3): qty 1

## 2019-01-19 MED ORDER — SODIUM CHLORIDE 0.9% FLUSH
3.0000 mL | Freq: Two times a day (BID) | INTRAVENOUS | Status: DC
  Administered 2019-01-19 – 2019-01-21 (×5): 3 mL via INTRAVENOUS

## 2019-01-19 MED ORDER — ALIGN 4 MG PO CAPS: 2.0000 | ORAL_CAPSULE | Freq: Every day | ORAL | Status: DC

## 2019-01-19 MED ORDER — ENOXAPARIN SODIUM 100 MG/ML ~~LOC~~ SOLN
85.0000 mg | Freq: Once | SUBCUTANEOUS | Status: DC
  Filled 2019-01-19: qty 1
  Filled 2019-01-19: qty 0.85

## 2019-01-19 MED ORDER — ADULT MULTIVITAMIN W/MINERALS CH
1.0000 | ORAL_TABLET | Freq: Every day | ORAL | Status: DC
  Administered 2019-01-19 – 2019-01-21 (×3): 1 via ORAL
  Filled 2019-01-19 (×4): qty 1

## 2019-01-19 MED ORDER — SODIUM CHLORIDE 0.9 % IV SOLN
500.0000 mg | Freq: Once | INTRAVENOUS | Status: AC
  Administered 2019-01-19: 500 mg via INTRAVENOUS
  Filled 2019-01-19: qty 500

## 2019-01-19 MED ORDER — LORAZEPAM 1 MG PO TABS
1.0000 mg | ORAL_TABLET | Freq: Four times a day (QID) | ORAL | Status: DC | PRN
  Administered 2019-01-19: 1 mg via ORAL
  Filled 2019-01-19: qty 1

## 2019-01-19 MED ORDER — DILTIAZEM HCL-DEXTROSE 100-5 MG/100ML-% IV SOLN (PREMIX)
5.0000 mg/h | INTRAVENOUS | Status: DC
  Administered 2019-01-19 (×2): 5 mg/h via INTRAVENOUS
  Filled 2019-01-19 (×2): qty 100

## 2019-01-19 MED ORDER — ONDANSETRON HCL 4 MG PO TABS: 8.0000 mg | ORAL_TABLET | Freq: Every day | ORAL | Status: DC | PRN

## 2019-01-19 MED ORDER — FOLIC ACID 1 MG PO TABS
1.0000 mg | ORAL_TABLET | Freq: Every day | ORAL | Status: DC
  Administered 2019-01-19 – 2019-01-21 (×3): 1 mg via ORAL
  Filled 2019-01-19 (×4): qty 1

## 2019-01-19 MED ORDER — RISAQUAD PO CAPS
2.0000 | ORAL_CAPSULE | Freq: Every day | ORAL | Status: DC
  Administered 2019-01-20 – 2019-01-21 (×2): 2 via ORAL
  Filled 2019-01-19 (×3): qty 2

## 2019-01-19 MED ORDER — IBUPROFEN 400 MG PO TABS
600.0000 mg | ORAL_TABLET | Freq: Once | ORAL | Status: AC
  Administered 2019-01-19: 600 mg via ORAL
  Filled 2019-01-19: qty 1

## 2019-01-19 MED ORDER — DILTIAZEM LOAD VIA INFUSION
20.0000 mg | Freq: Once | INTRAVENOUS | Status: AC
  Administered 2019-01-19: 20 mg via INTRAVENOUS
  Filled 2019-01-19: qty 20

## 2019-01-19 MED ORDER — SODIUM CHLORIDE 0.9 % IV SOLN: 250.0000 mL | INTRAVENOUS | Status: DC | PRN

## 2019-01-19 MED ORDER — METOPROLOL TARTRATE 5 MG/5ML IV SOLN
5.0000 mg | Freq: Once | INTRAVENOUS | Status: AC
  Administered 2019-01-19: 5 mg via INTRAVENOUS
  Filled 2019-01-19: qty 5

## 2019-01-19 MED ORDER — HEPARIN BOLUS VIA INFUSION
4000.0000 [IU] | Freq: Once | INTRAVENOUS | Status: AC
  Administered 2019-01-19: 4000 [IU] via INTRAVENOUS
  Filled 2019-01-19: qty 4000

## 2019-01-19 MED ORDER — ENOXAPARIN SODIUM 100 MG/ML ~~LOC~~ SOLN
85.0000 mg | Freq: Two times a day (BID) | SUBCUTANEOUS | Status: DC
  Filled 2019-01-19: qty 0.85

## 2019-01-19 MED ORDER — THIAMINE HCL 100 MG/ML IJ SOLN: 100.0000 mg | Freq: Every day | INTRAMUSCULAR | Status: DC

## 2019-01-19 MED ORDER — ENOXAPARIN SODIUM 100 MG/ML ~~LOC~~ SOLN
85.0000 mg | Freq: Two times a day (BID) | SUBCUTANEOUS | Status: DC
  Administered 2019-01-20: 85 mg via SUBCUTANEOUS
  Filled 2019-01-19 (×2): qty 1

## 2019-01-19 MED ORDER — VITAMIN B-1 100 MG PO TABS
100.0000 mg | ORAL_TABLET | Freq: Every day | ORAL | Status: DC
  Administered 2019-01-19 – 2019-01-21 (×3): 100 mg via ORAL
  Filled 2019-01-19 (×4): qty 1

## 2019-01-19 MED ORDER — ENOXAPARIN SODIUM 100 MG/ML ~~LOC~~ SOLN
85.0000 mg | Freq: Once | SUBCUTANEOUS | Status: DC
  Administered 2019-01-19: 85 mg via SUBCUTANEOUS
  Filled 2019-01-19: qty 0.85

## 2019-01-19 MED ORDER — SODIUM CHLORIDE 0.9 % IV SOLN
500.0000 mg | INTRAVENOUS | Status: DC
  Administered 2019-01-20 – 2019-01-21 (×2): 500 mg via INTRAVENOUS
  Filled 2019-01-19 (×4): qty 500

## 2019-01-19 MED ORDER — SODIUM CHLORIDE 0.9 % IV BOLUS
1000.0000 mL | Freq: Once | INTRAVENOUS | Status: AC
  Administered 2019-01-19: 1000 mL via INTRAVENOUS

## 2019-01-19 MED ORDER — POTASSIUM CHLORIDE IN NACL 20-0.9 MEQ/L-% IV SOLN
INTRAVENOUS | Status: DC
  Administered 2019-01-19: 18:00:00 via INTRAVENOUS
  Filled 2019-01-19 (×3): qty 1000

## 2019-01-19 MED ORDER — SODIUM CHLORIDE 0.9 % IV SOLN
1.0000 g | Freq: Once | INTRAVENOUS | Status: AC
  Administered 2019-01-19: 1 g via INTRAVENOUS
  Filled 2019-01-19: qty 10

## 2019-01-19 MED ORDER — SODIUM CHLORIDE 0.9% FLUSH: 3.0000 mL | INTRAVENOUS | Status: DC | PRN

## 2019-01-19 MED ORDER — GABAPENTIN 300 MG PO CAPS
600.0000 mg | ORAL_CAPSULE | Freq: Three times a day (TID) | ORAL | Status: DC
  Administered 2019-01-19 – 2019-01-21 (×6): 600 mg via ORAL
  Filled 2019-01-19 (×6): qty 2

## 2019-01-19 MED ORDER — ADENOSINE 6 MG/2ML IV SOLN
INTRAVENOUS | Status: AC
  Filled 2019-01-19: qty 6

## 2019-01-19 MED ORDER — ZOLPIDEM TARTRATE 5 MG PO TABS
5.0000 mg | ORAL_TABLET | Freq: Every evening | ORAL | Status: DC | PRN
  Administered 2019-01-19 – 2019-01-20 (×2): 5 mg via ORAL
  Filled 2019-01-19 (×2): qty 1

## 2019-01-19 MED ORDER — ACETAMINOPHEN 325 MG PO TABS
650.0000 mg | ORAL_TABLET | ORAL | Status: DC | PRN
  Administered 2019-01-19 – 2019-01-21 (×5): 650 mg via ORAL
  Filled 2019-01-19 (×5): qty 2

## 2019-01-19 MED ORDER — ADENOSINE 6 MG/2ML IV SOLN: 12.0000 mg | Freq: Once | INTRAVENOUS | Status: DC

## 2019-01-19 MED ORDER — HEPARIN (PORCINE) 25000 UT/250ML-% IV SOLN
1100.0000 [IU]/h | INTRAVENOUS | Status: DC
  Administered 2019-01-19: 1100 [IU]/h via INTRAVENOUS
  Filled 2019-01-19: qty 250

## 2019-01-19 MED ORDER — DIAZEPAM 5 MG PO TABS
10.0000 mg | ORAL_TABLET | Freq: Every day | ORAL | Status: DC | PRN
  Administered 2019-01-19 – 2019-01-21 (×3): 10 mg via ORAL
  Filled 2019-01-19 (×3): qty 2

## 2019-01-19 MED ORDER — LORAZEPAM 2 MG/ML IJ SOLN
1.0000 mg | Freq: Four times a day (QID) | INTRAMUSCULAR | Status: DC | PRN
  Administered 2019-01-20: 1 mg via INTRAVENOUS
  Filled 2019-01-19: qty 1

## 2019-01-19 NOTE — Progress Notes (Addendum)
ANTICOAGULATION CONSULT NOTE - Initial Consult  Pharmacy Consult;  Heparin Indication: atrial fibrillation  Allergies  Allergen Reactions  . Fentanyl Itching  . Ciprofloxacin Hives  . Dilaudid [Hydromorphone Hcl] Hives  . Flagyl [Metronidazole] Hives  . Visipaque [Iodixanol] Swelling    Throat swelling    Patient Measurements: Height: 5\' 6"  (167.6 cm) Weight: 190 lb (86.2 kg) IBW/kg (Calculated) : 59.3 Heparin Dosing Weight: 78 kg  Vital Signs: Temp: 100.3 F (37.9 C) (04/29 0927) Temp Source: Oral (04/29 0927) BP: 97/65 (04/29 1115) Pulse Rate: 96 (04/29 1045)  Labs: Recent Labs    01/19/19 0931  HGB 12.1  HCT 36.3  PLT 227  LABPROT 12.8  INR 1.0  CREATININE 1.03*    Estimated Creatinine Clearance: 54.6 mL/min (A) (by C-G formula based on SCr of 1.03 mg/dL (H)).   Medical History: Past Medical History:  Diagnosis Date  . Allergy   . Anemia   . Anxiety   . Arthritis    djd  . Cardiac arrhythmia   . Depression   . Diverticulitis   . Diverticulosis   . GERD (gastroesophageal reflux disease)   . Heart murmur   . HTN (hypertension)   . Infertility, female   . Neuromuscular disorder ([place])    idiopathic peripheral  neuropathy  . Peripheral neuropathy   . Positive PPD   . 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  Pharmacy consulted to initiate IV heparin for Afib.  She is not on [facility] PTA per documentation.  CBC WNL.  Goal of Therapy:  Heparin level 0.3-0.7 units/ml Monitor platelets by anticoagulation protocol: Yes   Plan:  Heparin 4000 units IV bolus, then Heparin gtt at 1100 units/hr Check 8 hr heparin level Daily heparin level and CBC   Charm Stenner D. Mina Marble, PharmD, BCPS, [place] 01/19/2019, 11:48 AM   ====================================   Addendum: Transition patient to Lovenox (patient was started on heparin 2 hrs ago, but did get a load) SCr 1.03, CrCL 55  ml/min  Plan: Stop heparin gtt and 60 min later start Lovenox 85mg  SQ Q12H CBC Q72H while on Lovenox   Venice Marcucci D. Mina Marble, PharmD, BCPS, [place] 01/19/2019, 2:23 PM

## 2019-01-19 NOTE — Consult Note (Signed)
Cardiology Consultation:   Patient ID: Katrina Benitez; [id number]; 1946/07/15   Admit date: 01/19/2019 Date of Consult: 01/19/2019  Primary Care Provider: Orma Flaming, MD Primary Cardiologist: Sherren Mocha, MD Primary Electrophysiologist:  None    Patient Profile:   Katrina Benitez is a 73 y.o. female with a PMH of HTN, mild aortic insufficiency, ideopathic peripheral neuropathy, herpes zoster c/b postherpetic neuralgia, and SIADH, who is being seen today for the evaluation of atrial fibrillation with RVR at the request of Dr. Evangeline Gula.  History of Present Illness:   Ms. Roel presented to the ED with cough x1 week with associated fever, SOB, and tachycardia starting last night prompting her to call EMS. Patient reported having a cough for the past week. She developed chills overnight and reported at temp of 102 at home. She reported taking 684m of tylenol x2 doses at home. She was given aspirin 3236men route to the ED.   She was last seen by cardiology at an outpatient visit with NiPecolia AdesNP 09/2018 for her annual cardiology follow-up appointment. She reported having a cold at that time and was still experiencing occasional chest pain which was felt to be 2/2 post herpetic neuralgia and improved with gabapentin. She was without exertional complaints at that time. No medication changes occurred at that time and patient was recommended to follow-up with Dr. CoBurt Knackn 1 year. Her last echocardiogram in 2018 showed EF 60-65%, G1DD, no wall motion abnormalities, mild AI, mild MR, and mild biatrial enlargement. Her last ischemic evaluation was a NST in 2017 which was without ischemia.   Hospital course: T 100.3 on arrival, Tachycardic to 160s and improved to 80s, BP initially stable but developed hypotension with BP 80s/50s, intermittently tachypneic, satting well on room air. Labs notable for electrolytes wnl, Cr 1.03 (baseline 0.8), CBC wnl, lactate 2.0>1.3, TSH wnl. In-house COVID-19  testing negative, send out test pending. RVP, LDH, ESR, CRP, ferritin, Ddimer, BNP and procalcitonin pending. Initial EKG with what appears to be Afib RVR vs SVT with rate 167. Repeat EKG with confirmed atrial fibrillation with rate 89, no STE/D, isolated TWI in V2. CXR was without acute findings, however CT Chest with patchy peripheral airspace disease involving upper and lower lobes bilaterally c/f atypical infection. Preliminary LE doppler without DVT. She was started on IV antibiotics for bilateral PNA. She was given IV metoprolol with improvement in HR, subsequently started on a diltiazem gtt which was discontinued due to hypotension, presently on metoprolol 2573mID with stable HR's. She was started on a heparin gtt for stoke ppx. She was given IVFs for management of hypotension. Cardiology asked to evaluate for new onset atrial fibrillation.   Past Medical History:  Diagnosis Date   Allergy    Anemia    Anxiety    Arthritis    djd   Cardiac arrhythmia    Depression    Diverticulitis    Diverticulosis    GERD (gastroesophageal reflux disease)    Heart murmur    HTN (hypertension)    Infertility, female    Neuromuscular disorder (HCC)    idiopathic peripheral  neuropathy   Peripheral neuropathy    Positive PPD    SIADH (syndrome of inappropriate ADH production) [place]   Past Surgical History:  Procedure Laterality Date   BREAST BIOPSY Left    BREAST EXCISIONAL BIOPSY Left    benign   CERVICAL BIOPSY  W/ LOOP ELECTRODE EXCISION     CERVICAL POLYPECTOMY  02/2016  COLONOSCOPY     COLPOSCOPY     DILATION AND CURETTAGE OF UTERUS     HYSTEROSCOPY     LUMBAR DISC SURGERY     LUMBAR LAMINECTOMY     TONSILLECTOMY     UPPER GASTROINTESTINAL ENDOSCOPY       Home Medications:  Prior to Admission medications   Medication Sig Start Date End Date Taking? Authorizing Provider  diazepam (VALIUM) 10 MG tablet TAKE ONE TABLET DAILY AS NEEDED Patient taking  differently: Take 10 mg by mouth daily as needed for anxiety.  10/21/18  Yes Orma Flaming, MD  gabapentin (NEURONTIN) 600 MG tablet Take 1 tablet (600 mg total) by mouth 3 (three) times daily. 06/26/17  Yes Kathrynn Ducking, MD  hydrOXYzine (ATARAX/VISTARIL) 25 MG tablet Take 1 tablet (25 mg total) by mouth 3 (three) times daily as needed. 08/12/18  Yes Orma Flaming, MD  ibuprofen (ADVIL,MOTRIN) 200 MG tablet Take by mouth daily as needed. 01/22/16  Yes [provider]  ondansetron (ZOFRAN) 8 MG tablet Take 8 mg by mouth daily as needed for nausea.    Yes [provider]  Probiotic Product (ALIGN PO) Take by mouth daily.   Yes [provider]  zolpidem (AMBIEN) 10 MG tablet Take 1 tablet (10 mg total) by mouth at bedtime as needed. 01/14/19  Yes Orma Flaming, MD    Inpatient Medications: Scheduled Meds:  [START ON 01/20/2019] acidophilus  2 capsule Oral Daily   adenosine       [START ON 01/20/2019] enoxaparin (LOVENOX) injection  85 mg Subcutaneous Q12H   enoxaparin (LOVENOX) injection  85 mg Subcutaneous Once   folic acid  1 mg Oral Daily   gabapentin  600 mg Oral TID   metoprolol tartrate  25 mg Oral BID   multivitamin with minerals  1 tablet Oral Daily   sodium chloride flush  3 mL Intravenous Q12H   thiamine  100 mg Oral Daily   Or   thiamine  100 mg Intravenous Daily   Continuous Infusions:  sodium chloride     0.9 % NaCl with KCl 20 mEq / L     [START ON 01/20/2019] azithromycin     diltiazem (CARDIZEM) infusion 5 mg/hr (01/19/19 1004)   PRN Meds: sodium chloride, acetaminophen, diazepam, LORazepam **OR** LORazepam, ondansetron, sodium chloride flush, zolpidem  Allergies:    Allergies  Allergen Reactions   Fentanyl Itching   Ciprofloxacin Hives   Dilaudid [Hydromorphone Hcl] Hives   Flagyl [Metronidazole] Hives   Visipaque [Iodixanol] Swelling    Throat swelling    Social History:   Social History   Socioeconomic  History   Marital status: Married    Spouse name: John   Number of children: 0   Years of education: College   Highest education level: Not on file  Occupational History   Occupation: retired Presenter, broadcasting strain: Not on file   Food insecurity:    Worry: Not on file    Inability: Not on file   Transportation needs:    Medical: Not on file    Non-medical: Not on file  Tobacco Use   Smoking status: Never Smoker   Smokeless tobacco: Never Used  Substance and Sexual Activity   Alcohol use: Yes    Alcohol/week: 14.0 standard drinks    Types: 14 Glasses of wine per week   Drug use: No   Sexual activity: Yes    Birth control/protection: Post-menopausal  Lifestyle  Physical activity:    Days per week: Not on file    Minutes per session: Not on file   Stress: Not on file  Relationships   Social connections:    Talks on phone: Not on file    Gets together: Not on file    Attends religious service: Not on file    Active member of club or organization: Not on file    Attends meetings of clubs or organizations: Not on file    Relationship status: Not on file   Intimate partner violence:    Fear of current or ex partner: Not on file    Emotionally abused: Not on file    Physically abused: Not on file    Forced sexual activity: Not on file  Other Topics Concern   Not on file  Social History Narrative   Lives at home with her husband.   Right-handed.   1-2 cups caffeine per day.    Family History:    Family History  Problem Relation Age of Onset   Breast cancer Mother    Glaucoma Mother    Miscarriages / Stillbirths Mother    Heart attack Father    Depression Father    Early death Father    Heart disease Father    Tuberculosis Paternal Grandmother    Prostate cancer Paternal Grandfather    Depression Brother    Hypertension Brother    Colon cancer Neg Hx    Esophageal cancer Neg Hx    Rectal cancer Neg  Hx    Stomach cancer Neg Hx      ROS:  Please see the history of present illness.   All other ROS reviewed and negative.     Physical Exam/Data:   Vitals:   01/19/19 1345 01/19/19 1415 01/19/19 1430 01/19/19 1445  BP: (!) 89/58 (!) 88/61 (!) 89/65 96/85  Pulse: 99 81 84 95  Resp: '18 18 18 ' (!) 24  Temp:      TempSrc:      SpO2: 95% 96% 99% 96%  Weight:    90.4 kg  Height:    '5\' 6"'  (1.676 m)    Intake/Output Summary (Last 24 hours) at 01/19/2019 1615 Last data filed at 01/19/2019 1433 Gross per 24 hour  Intake 2350 ml  Output --  Net 2350 ml   Filed Weights   01/19/19 0928 01/19/19 1445  Weight: 86.2 kg 90.4 kg   Body mass index is 32.17 kg/m.   Physical Exam per MD:  Because of the COVID-19 pandemic in this patient's high clinical suspicion, face-to-face examination is not performed.  The patient is conversant without obvious shortness of breath during our discussion.  EKG:  The EKG was personally reviewed and demonstrates:  Initial EKG with what appears to be Afib RVR vs SVT with rate 167. Repeat EKG with confirmed atrial fibrillation with rate 89, no STE/D, isolated TWI in V2.   Telemetry:  Telemetry was personally reviewed and demonstrates: Atrial fibrillation with heart rate in the 80s  Relevant CV Studies: Echocardiogram 2018: Study Conclusions  - Left ventricle: The cavity size was normal. Wall thickness was   normal. Systolic function was normal. The estimated ejection   fraction was in the range of 60% to 65%. Wall motion was normal;   there were no regional wall motion abnormalities. Doppler   parameters are consistent with abnormal left ventricular   relaxation (grade 1 diastolic dysfunction). - Aortic valve: Trileaflet; mildly calcified leaflets. There was no  stenosis. There was mild regurgitation. - Aorta: Ascending aortic diameter: 41 mm (S). - Ascending aorta: The ascending aorta was mildly dilated. - Mitral valve: There was mild  regurgitation. - Left atrium: The atrium was mildly dilated. - Right ventricle: The cavity size was normal. Systolic function   was normal. - Right atrium: The atrium was mildly dilated. - Tricuspid valve: Peak RV-RA gradient (S): 29 mm Hg. - Pulmonary arteries: PA peak pressure: 32 mm Hg (S). - Inferior vena cava: The vessel was normal in size. The   respirophasic diameter changes were in the normal range (>= 50%),   consistent with normal central venous pressure.  Impressions:  - Normal LV size with EF 60-65%. Normal RV size and systolic   function. Mild aortic insufficiency. Mild mitral regurgitation.  NST 2017: IMPRESSION: 1. No reversible ischemia or infarction.  2. Normal left ventricular wall motion.  3. Left ventricular ejection fraction 66%  4. Low-risk stress test findings*.  Laboratory Data:  Chemistry Recent Labs  Lab 01/19/19 0931  NA 135  K 4.3  CL 99  CO2 24  GLUCOSE 104*  BUN 13  CREATININE 1.03*  CALCIUM 8.6*  GFRNONAA 54*  GFRAA >60  ANIONGAP 12    No results for input(s): PROT, ALBUMIN, AST, ALT, ALKPHOS, BILITOT in the last 168 hours. Hematology Recent Labs  Lab 01/19/19 0931  WBC 7.8  RBC 4.03  HGB 12.1  HCT 36.3  MCV 90.1  MCH 30.0  MCHC 33.3  RDW 14.4  PLT 227   Cardiac EnzymesNo results for input(s): TROPONINI in the last 168 hours. No results for input(s): TROPIPOC in the last 168 hours.  BNPNo results for input(s): BNP, PROBNP in the last 168 hours.  DDimer No results for input(s): DDIMER in the last 168 hours.  Radiology/Studies:  Ct Chest Wo Contrast  Result Date: 01/19/2019 CLINICAL DATA:  Cough and shortness of breath. EXAM: CT CHEST WITHOUT CONTRAST TECHNIQUE: Multidetector CT imaging of the chest was performed following the standard protocol without IV contrast. COMPARISON:  One-view chest x-ray 01/19/2019. CT of the chest 08/01/2016. FINDINGS: Cardiovascular: The heart size is normal. Minimal atherosclerotic  calcifications are present at the aortic arch. There is mild prominence the pulmonary arteries bilaterally. Mediastinum/Nodes: Subcentimeter mediastinal nodes mediastinal are present. No significant hilar or axillary adenopathy is present. Lungs/Pleura: Patchy peripheral airspace disease is present bilaterally. There is no lower lobe predominance. No significant consolidation is present. There is no cord vasculature. Bronchi are patent. No significant pleural effusion is present. Upper Abdomen: Within normal limits. Musculoskeletal: Vertebral body heights alignment are maintained. No focal lytic or blastic lesions are present. IMPRESSION: 1. Patchy peripheral airspace disease involving the upper and lower lobes bilaterally. The findings are nonspecific. This raises concern for atypical infection. 2. Subcentimeter lymph nodes are likely reactive. Electronically Signed   By: San Morelle M.D.   On: 01/19/2019 12:04   Dg Chest Port 1 View  Result Date: 01/19/2019 CLINICAL DATA:  Cough, shortness of breath. EXAM: PORTABLE CHEST 1 VIEW COMPARISON:  Radiographs of Jan 22, 2016. CT scan of August 01, 2016. FINDINGS: Stable cardiomediastinal silhouette. No pneumothorax or pleural effusion is noted. Both lungs are clear. The visualized skeletal structures are unremarkable. IMPRESSION: No active disease. Electronically Signed   By: Marijo Conception M.D.   On: 01/19/2019 09:51   Vas [place] Lower Extremity Venous (dvt) (only Mc & Wl 7a-7p)  Result Date: 01/19/2019  Lower Venous Study Indications: Edema.  Performing Technologist: Abram Sander  RVS  Examination Guidelines: A complete evaluation includes B-mode imaging, spectral Doppler, color Doppler, and power Doppler as needed of all accessible portions of each vessel. Bilateral testing is considered an integral part of a complete examination. Limited examinations for reoccurring indications may be performed as noted.   +---------+---------------+---------+-----------+----------+-------+  RIGHT     Compressibility Phasicity Spontaneity Properties Summary  +---------+---------------+---------+-----------+----------+-------+  CFV       Full            Yes       Yes                             +---------+---------------+---------+-----------+----------+-------+  SFJ       Full                                                      +---------+---------------+---------+-----------+----------+-------+  FV Prox   Full                                                      +---------+---------------+---------+-----------+----------+-------+  FV Mid    Full                                                      +---------+---------------+---------+-----------+----------+-------+  FV Distal Full                                                      +---------+---------------+---------+-----------+----------+-------+  PFV       Full                                                      +---------+---------------+---------+-----------+----------+-------+  POP       Full            Yes       Yes                             +---------+---------------+---------+-----------+----------+-------+  PTV       Full                                                      +---------+---------------+---------+-----------+----------+-------+  PERO      Full                                                      +---------+---------------+---------+-----------+----------+-------+   +---------+---------------+---------+-----------+----------+-------+  LEFT      Compressibility Phasicity Spontaneity Properties Summary  +---------+---------------+---------+-----------+----------+-------+  CFV       Full            Yes       Yes                             +---------+---------------+---------+-----------+----------+-------+  SFJ       Full                                                      +---------+---------------+---------+-----------+----------+-------+  FV Prox   Full                                                       +---------+---------------+---------+-----------+----------+-------+  FV Mid    Full                                                      +---------+---------------+---------+-----------+----------+-------+  FV Distal Full                                                      +---------+---------------+---------+-----------+----------+-------+  PFV       Full                                                      +---------+---------------+---------+-----------+----------+-------+  POP       Full            Yes       Yes                             +---------+---------------+---------+-----------+----------+-------+  PTV       Full                                                      +---------+---------------+---------+-----------+----------+-------+  PERO      Full                                                      +---------+---------------+---------+-----------+----------+-------+     Summary: Right: There is no evidence of deep vein thrombosis in the lower extremity. No cystic structure found in the popliteal fossa. Left: There is no evidence of deep vein thrombosis in the lower extremity. No cystic structure  found in the popliteal fossa.  *See table(s) above for measurements and observations.    Preliminary     Assessment and Plan:   1. New onset atrial fibrillation with RVR: patient presented with cough x1 week with subsequent fever, SOB, and tachycardia overnight prompting her to activate EMS. On arrival to the ED she was tachycardic to the 160s with initially EKG revealing SVT vs Afib RVR. She was given IV metoprolol and slower HR confirmed atrial fibrillation. She was then started on a diltiazem gtt, however this was stopped due to hypotension. Rate has been stable in the 80s and po metoprolol 58m BID was initiated. Initial labs with normal electrolytes and TSH wnl. Last echo in 2018 with EF 60-65%, G1DD, no wall motion abnormalities, mild AI, mild  MR, and mild biatrial enlargement. Her last ischemic evaluation was a NST in 2017 which was without ischemia. Likely afib is driven by infection.  - Echo pending - Continue metoprolol 2338mBID for rate control and titrate as needed - This patients CHA2DS2-VASc Score and unadjusted Ischemic Stroke Rate (% per year) is equal to 3.2 % stroke rate/year from a score of 3 Above score calculated as 1 point each if present [CHF, HTN, DM, Vascular=MI/PAD/Aortic Plaque, Age if 65-74, or Female] Above score calculated as 2 points each if present [Age > 75, or Stroke/TIA/TE] - Continue heparin gtt for stroke ppx - plan to transition to eliquis 38m62mID once it is clear that no further procedures are needed.   2. PNA: patient presented with cough x1 week with subsequent fever, SOB, and tachycardia overnight. CXR was without acute findings, however CT Chest c/f atypical infection. COVID-19 testing in-house was negative, sendout test pending. She was started on IV antibiotics. Likely infection is driving #1 - Continue management per primary team  3. HTN: not on medications prior to arrival. BP soft due to management of #1. Currently on metoprolol 238m79mD. - Continue metoprolol and titrate per HR and BP  4. Aortic insufficiency: noted to be mild on last echo in 2018. Repeat echo pending for the evaluation of #1 - Continue routine monitoring   5. AKI: Cr 1.03 on admission; baseline 0.8 - Continue to monitor  For questions or updates, please contact [facility] consult [email or web address] for contact info under Cardiology/STEMI.   Signed, KrisAbigail Butts-C  01/19/2019 4:15 PM [phone] seen, examined. Available data reviewed. Agree with findings, assessment, and plan as outlined by KrisRoby Lofts-C.  Because of the COVID-19 pandemic in this patient's high clinical suspicion of COVID-19, this evaluation is done via telehealth.  I initially tried to perform a video evaluation but was  unsuccessful due to technical difficulties on the patient's end.  I interviewed her over the telephone.  She describes 2 weeks of coughing with progressive shortness of breath over the last 48 hours.  She felt very poorly last night with a febrile illness.  States that her husband took her temperature and it was nearly 103 3 EMS was called and she was brought to the emergency department.  She has had no chest pain or pressure.  She has had no lightheadedness, syncope, or leg swelling.  She experienced heart palpitations at the time of her presentation and she was noted to be in atrial fibrillation with RVR at that time.  States that those symptoms have completely resolved at present.  I personally reviewed her telemetry today which demonstrates atrial fibrillation with controlled ventricular rate.  Heart  rate is currently in the 80s.  All other radiographic and lab data is reviewed.  The patient is currently being treated with Rocephin and azithromycin for atypical pneumonia based on bilateral peripheral infiltrates seen on CT of the chest.  Her COVID-19 send away test is currently pending.  I agree with keeping her on IV heparin and transitioning her to Eliquis when she is clear that she will not require any procedures.  Will control her heart rate with diltiazem and metoprolol titrated to a heart rate less than 100 bpm as long as her blood pressure allows for this.  Will obtain an echocardiogram once her COVID-19 status is clarified.  I discussed the natural history of atrial fibrillation with this patient today.  This is her first episode and likely related to her viral infection.  Plans and recommendations otherwise as outlined above.  We will follow with you.  All of her questions are answered today.  Sherren Mocha, M.D. 01/19/2019 7:00 PM

## 2019-01-19 NOTE — ED Notes (Signed)
Spoke with pharmacy who will verify and send medication STAT.

## 2019-01-19 NOTE — Telephone Encounter (Signed)
Spoke with patient currently in the ER.We will follow up at a later date.

## 2019-01-19 NOTE — ED Triage Notes (Signed)
Pt arrives to ED from home with complaints of cough x1 week and shortness of breath, fever, chills, and tachycardia starting last night. Pt received 324 ASA via EMS and 283ml NS bolus. Pt took x2 650mg  tylenol at home.

## 2019-01-19 NOTE — Progress Notes (Signed)
Lower extremity venous has been completed.   Preliminary results in CV Proc.   Abram Sander 01/19/2019 2:26 PM

## 2019-01-19 NOTE — ED Notes (Signed)
Placed a external cath on patient. Patient is now resting with call bell in reach

## 2019-01-19 NOTE — ED Notes (Signed)
Nurse navigator spoke with patient who said to call her husband Jenny Reichmann [phone]. Updated husband on plan of care and was very thank for for calling him and updating him.

## 2019-01-19 NOTE — H&P (Addendum)
History and Physical    Katrina Benitez FKC:[id number] DOB: 11/29/1945 DOA: 01/19/2019  PCP: Orma Flaming, MD  Patient coming from: Home  I have personally briefly reviewed patient's old medical records in [place]  Chief Complaint: Tachycardia, cough, fever  HPI: Katrina Benitez is a 73 y.o. female with medical history significant of Anxiety, unspecified cardiac arrhythmia, diverticulosis, gastroesophageal reflux disease, aortic stenosis, SIADH, postherpetic neuralgia causing chest pain in the past, today with cough fever and shortness of breath.  She was also found to be markedly tachycardic.  Evaluation in the ER revealed atrial fibrillation with rapid ventricular response.  Cough has been present for 1 week.  She developed chills during the night checked her temperature when she woke up.  She had a temperature of 102.  She took 650 mg of Tylenol twice at home.  She called EMS who gave her aspirin.  Her heart rate was also elevated.  He may have had a prior episode of atrial fibrillation but it resolved on route to the doctor's office.  Not on any blood thinners.  She denies any chest pain.  She has no known COVID exposures.  She is a retired Engineer, drilling.  In addition to the above symptoms she has a left lower extremity edema which she states is new.  He is a retired Stage manager and is concerned that she may have a pulmonary embolism.  She also drinks 14 glasses of wine weekly to which she admits but on conversation with her husband it may be twice that much.  Review of her medications are also concerning for anxiety state leading her to perhaps imbibe more than usual.  She personally states that she has never been in alcohol withdrawal.  Her husband states that she has never withdrawn and he does not know if she is an alcoholic but that he is in recovery.  Later in discussion she states that she starts drinking in the late afternoon and drinks at least 4 glasses of wine a day.  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  She and her husband retired here some years ago.  ED Course: EKG significant for atrial fibrillation with rapid ventricular response.  She was placed on a heparin drip due to her chads vas 2 score as well as a diltiazem drip.  Blood pressure dipped a little bit and diltiazem drip was discontinued.  Patient was initially checked for COVID-19 in the emergency department and rapid testing was negative but given syndrome presentation repeat testing was sent to outside lab.  X-ray shows a bilateral pneumonia although not in the typical interstitial infiltrate pattern consistent with COVID-19.  Ceftin was given in the emergency department along with azithromycin.  CT scan was read by radiology as typical infection.  She was referred to me for further evaluation.  Review of Systems: As per HPI otherwise all other systems reviewed and  negative.   Past Medical History:  Diagnosis Date   Allergy    Anemia    Anxiety    Arthritis    djd   Cardiac arrhythmia    Depression    Diverticulitis    Diverticulosis    GERD (gastroesophageal reflux disease)    Heart murmur    HTN (hypertension)    Infertility, female    Neuromuscular disorder (HCC)    idiopathic peripheral  neuropathy   Peripheral neuropathy    Positive PPD    SIADH (syndrome of inappropriate ADH production) ([place])  Past Surgical History:  Procedure Laterality Date   BREAST BIOPSY Left    BREAST EXCISIONAL BIOPSY Left    benign   CERVICAL BIOPSY  W/ LOOP ELECTRODE EXCISION     CERVICAL POLYPECTOMY  02/2016   COLONOSCOPY     COLPOSCOPY     DILATION AND CURETTAGE OF UTERUS     HYSTEROSCOPY     LUMBAR DISC SURGERY     LUMBAR LAMINECTOMY     TONSILLECTOMY     UPPER GASTROINTESTINAL ENDOSCOPY      Social History   Social History Narrative   Lives at home with her husband.   Right-handed.   1-2 cups caffeine per day.   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 She has never used smokeless tobacco. She reports current alcohol use of about 14.0 standard drinks of alcohol per week. She reports that she does not use drugs.  Allergies  Allergen Reactions   Fentanyl Itching   Ciprofloxacin Hives   Dilaudid [Hydromorphone Hcl] Hives   Flagyl [Metronidazole] Hives   Visipaque [Iodixanol] Swelling    Throat swelling    Family History  Problem Relation Age of Onset   Breast cancer Mother    Glaucoma Mother    33 / Stillbirths Mother    Heart attack Father    Depression Father    Early death Father    Heart disease Father    Tuberculosis Paternal Grandmother    Prostate cancer Paternal Grandfather    Depression Brother    Hypertension Brother    Colon cancer Neg Hx    Esophageal cancer Neg Hx    Rectal cancer Neg Hx    Stomach cancer Neg Hx     Prior to Admission medications   Medication Sig Start Date End Date Taking? Authorizing Provider  diazepam (VALIUM) 10 MG tablet TAKE ONE TABLET DAILY AS NEEDED Patient taking differently: Take 10 mg by mouth daily as needed for anxiety.  10/21/18  Yes Orma Flaming, MD  gabapentin (NEURONTIN) 600 MG tablet Take 1 tablet (600 mg total) by mouth 3 (three) times daily. 06/26/17  Yes Kathrynn Ducking, MD  hydrOXYzine (ATARAX/VISTARIL) 25 MG tablet Take 1 tablet (25 mg total) by mouth 3 (three) times daily as needed. 08/12/18  Yes Orma Flaming, MD  ibuprofen (ADVIL,MOTRIN) 200 MG tablet Take by mouth daily as needed. 01/22/16  Yes [provider]  ondansetron (ZOFRAN) 8 MG tablet Take 8 mg by mouth daily as needed for nausea.    Yes [provider]  Probiotic Product (ALIGN PO) Take by mouth daily.   Yes [provider]  zolpidem (AMBIEN) 10 MG tablet Take 1 tablet (10 mg total) by mouth at bedtime as needed. 01/14/19  Yes Orma Flaming, MD    Physical Exam:  Constitutional: NAD, calm,  comfortable Vitals:   01/19/19 1345 01/19/19 1415 01/19/19 1430 01/19/19 1445  BP: (!) 89/58 (!) 88/61 (!) 89/65 96/85  Pulse: 99 81 84 95  Resp: 18 18 18  (!) 24  Temp:      TempSrc:      SpO2: 95% 96% 99% 96%  Weight:    90.4 kg  Height:    5\' 6"  (1.676 m)   Eyes: PERRL, lids and conjunctivae normal ENMT: Mucous membranes are moist. Posterior pharynx clear of any exudate or lesions.Normal dentition.  Neck: normal, supple, no masses, no thyromegaly Respiratory: clear to auscultation bilaterally, no wheezing, no crackles. Normal respiratory effort. No  accessory muscle use.  Cardiovascular: Irregularly irregular rate and rhythm, no murmurs / rubs / gallops. No extremity edema. 2+ pedal pulses. No carotid bruits.  Abdomen: no tenderness, no masses palpated. No hepatosplenomegaly. Bowel sounds positive.  Musculoskeletal: no clubbing / cyanosis. No joint deformity upper and lower extremities. Good ROM, no contractures. Normal muscle tone.  Skin: no rashes, lesions, ulcers. No induration Neurologic: CN 2-12 grossly intact. Sensation intact, DTR normal. Strength 5/5 in all 4.  Psychiatric: Normal judgment and insight. Alert and oriented x 3. Normal mood.    Labs on Admission: I have personally reviewed following labs and imaging studies  CBC: Recent Labs  Lab 01/19/19 0931  WBC 7.8  HGB 12.1  HCT 36.3  MCV 90.1  PLT 397   Basic Metabolic Panel: Recent Labs  Lab 01/19/19 0931  NA 135  K 4.3  CL 99  CO2 24  GLUCOSE 104*  BUN 13  CREATININE 1.03*  CALCIUM 8.6*  MG 1.8   GFR: Estimated Creatinine Clearance: 55.9 mL/min (A) (by C-G formula based on SCr of 1.03 mg/dL (H)). Coagulation Profile: Recent Labs  Lab 01/19/19 0931  INR 1.0   Thyroid Function Tests: Recent Labs    01/19/19 1621  TSH 1.006   Urine analysis:    Component Value Date/Time   COLORURINE STRAW (A) 01/19/2019 1839   APPEARANCEUR CLEAR 01/19/2019 1839   LABSPEC 1.009 01/19/2019 1839   PHURINE  6.0 01/19/2019 1839   GLUCOSEU NEGATIVE 01/19/2019 1839   HGBUR NEGATIVE 01/19/2019 1839   BILIRUBINUR NEGATIVE 01/19/2019 1839   BILIRUBINUR N 07/13/2018 1113   [place] 01/19/2019 1839   PROTEINUR NEGATIVE 01/19/2019 1839   UROBILINOGEN 0.2 07/13/2018 1113   NITRITE NEGATIVE 01/19/2019 1839   LEUKOCYTESUR NEGATIVE 01/19/2019 1839    Radiological Exams on Admission: Ct Chest Wo Contrast  Result Date: 01/19/2019 CLINICAL DATA:  Cough and shortness of breath. EXAM: CT CHEST WITHOUT CONTRAST TECHNIQUE: Multidetector CT imaging of the chest was performed following the standard protocol without IV contrast. COMPARISON:  One-view chest x-ray 01/19/2019. CT of the chest 08/01/2016. FINDINGS: Cardiovascular: The heart size is normal. Minimal atherosclerotic calcifications are present at the aortic arch. There is mild prominence the pulmonary arteries bilaterally. Mediastinum/Nodes: Subcentimeter mediastinal nodes mediastinal are present. No significant hilar or axillary adenopathy is present. Lungs/Pleura: Patchy peripheral airspace disease is present bilaterally. There is no lower lobe predominance. No significant consolidation is present. There is no cord vasculature. Bronchi are patent. No significant pleural effusion is present. Upper Abdomen: Within normal limits. Musculoskeletal: Vertebral body heights alignment are maintained. No focal lytic or blastic lesions are present. IMPRESSION: 1. Patchy peripheral airspace disease involving the upper and lower lobes bilaterally. The findings are nonspecific. This raises concern for atypical infection. 2. Subcentimeter lymph nodes are likely reactive. Electronically Signed   By: San Morelle M.D.   On: 01/19/2019 12:04   Dg Chest Port 1 View  Result Date: 01/19/2019 CLINICAL DATA:  Cough, shortness of breath. EXAM: PORTABLE CHEST 1 VIEW COMPARISON:  Radiographs of Jan 22, 2016. CT scan of August 01, 2016. FINDINGS: Stable  cardiomediastinal silhouette. No pneumothorax or pleural effusion is noted. Both lungs are clear. The visualized skeletal structures are unremarkable. IMPRESSION: No active disease. Electronically Signed   By: Marijo Conception M.D.   On: 01/19/2019 09:51   Vas [place] Lower Extremity Venous (dvt) (only Mc & Wl 7a-7p)  Result Date: 01/19/2019  Lower Venous Study Indications: Edema.  Performing Technologist: Abram Sander RVS  Examination Guidelines: A complete evaluation includes B-mode imaging, spectral Doppler, color Doppler, and power Doppler as needed of all accessible portions of each vessel. Bilateral testing is considered an integral part of a complete examination. Limited examinations for reoccurring indications may be performed as noted.  +---------+---------------+---------+-----------+----------+-------+  RIGHT     Compressibility Phasicity Spontaneity Properties Summary  +---------+---------------+---------+-----------+----------+-------+  CFV       Full            Yes       Yes                             +---------+---------------+---------+-----------+----------+-------+  SFJ       Full                                                      +---------+---------------+---------+-----------+----------+-------+  FV Prox   Full                                                      +---------+---------------+---------+-----------+----------+-------+  FV Mid    Full                                                      +---------+---------------+---------+-----------+----------+-------+  FV Distal Full                                                      +---------+---------------+---------+-----------+----------+-------+  PFV       Full                                                      +---------+---------------+---------+-----------+----------+-------+  POP       Full            Yes       Yes                             +---------+---------------+---------+-----------+----------+-------+  PTV       Full                                                       +---------+---------------+---------+-----------+----------+-------+  PERO      Full                                                      +---------+---------------+---------+-----------+----------+-------+   +---------+---------------+---------+-----------+----------+-------+  LEFT      Compressibility Phasicity Spontaneity Properties Summary  +---------+---------------+---------+-----------+----------+-------+  CFV       Full            Yes       Yes                             +---------+---------------+---------+-----------+----------+-------+  SFJ       Full                                                      +---------+---------------+---------+-----------+----------+-------+  FV Prox   Full                                                      +---------+---------------+---------+-----------+----------+-------+  FV Mid    Full                                                      +---------+---------------+---------+-----------+----------+-------+  FV Distal Full                                                      +---------+---------------+---------+-----------+----------+-------+  PFV       Full                                                      +---------+---------------+---------+-----------+----------+-------+  POP       Full            Yes       Yes                             +---------+---------------+---------+-----------+----------+-------+  PTV       Full                                                      +---------+---------------+---------+-----------+----------+-------+  PERO      Full                                                      +---------+---------------+---------+-----------+----------+-------+     Summary: Right: There is no evidence of deep vein thrombosis in the lower extremity. No cystic structure found in the popliteal fossa. Left: There is no evidence of deep vein thrombosis in the lower extremity. No cystic structure found  in  the popliteal fossa.  *See table(s) above for measurements and observations. Electronically signed by Curt Jews MD on 01/19/2019 at 5:16:06 PM.    Final     EKG: Independently reviewed.  EKG #1 shows atrial fibrillation with rapid ventricular response at 167 bpm.  EKG #2 shows atrial fibrillation at 84 bpm.  Assessment/Plan Principal Problem:   Atrial fibrillation with RVR (HCC) Active Problems:   Influenza-like illness   Atypical pneumonia   Post herpetic neuralgia   Edema of left lower extremity   Alcohol use   SIADH (syndrome of inappropriate ADH production) (HCC)   Hypertension   Anxiety   GERD (gastroesophageal reflux disease)   Hyperlipidemia    1.  Atrial fibrillation with rapid ventricular response: Patient initially started on diltiazem drip which has been discontinued.  Her heart rate is controlled on oral metoprolol.  She is anticoagulated with heparin pending further evaluation of need for possible procedures.  Echocardiogram is pending.  Cardiology has seen the patient and agrees with the above plan.  Does have a history of aortic stenosis for which cardiology has evaluated her and is aware of.  2.  Influenza-like illness: Initially COVID negative in the emergency department atypical pneumonia and chest x-ray pattern are not consistent with interstitial infiltrates seen in COVID patient's however given her symptomatology confirmatory testing has been sent.  I have also ordered respiratory viral panel.  3.  Atypical pneumonia: Patient given Rocephin and azithromycin in the emergency department.  I have discontinued Rocephin and will continue azithromycin 500 mg IV daily.  Please watch QT interval as patient at risk given her atrial fibrillation for other forms of arrhythmia.  4.  Postherpetic neuralgia: Patient takes gabapentin 600 mg 3 times a day.  She remarks that it does not make her sleepy at all.  Continue same.  5.  Edema of left lower extremity: Patient with concern  for DVT and possible PE.  Not consistent with her symptomatology however lower extremity Dopplers were obtained.  They were preliminarily read as negative.  Waiting confirmatory report from vascular surgery.  If further concerns would consider a VQ scan however patient's husband a retired Tour manager says that she can take dye.  I would recommend calling Mr. Dr. Clydene Laming regarding this.  He states she has received dye in the past.  6.  Alcohol use: Concerning for possible alcoholism and concern for habituation and possible withdrawal.  Will place patient on MedSurg CIWA scoring system.  Patient made aware as was her husband.  7.  SIADH: Patient with a history of same her sodium levels were normal on admission.  We will continue to monitor.  Husband reports she does not take her medications prescribed for it.  8.  Hypertension: Blood pressures in the emergency department when started on diltiazem drip.  Any medications at home.  Currently blood pressures are stable and will monitor.  9.  Anxiety: Patient takes Valium at home along with Vistaril 3 times daily as needed for anxiety.  Continue Valium Vistaril is on hold given propensity to cause palpitations and tachycardia.  10.  Hyperlipidemia: Continue to monitor we will check lipid levels.  Patient not on any antihyperlipidemic medications   DVT prophylaxis: IV heparin Code Status: Full code Family Communication: Spoke with patient's husband Dr. Clydene Laming a retired radiologist from [employer]. Disposition Plan: Likely home in 48 hours Consults called: Cardiology Admission status: Inpatient  Admit - It is my clinical opinion that admission to INPATIENT is reasonable and  necessary because of the expectation that this patient will require hospital care that crosses at least 2 midnights to treat this condition based on the medical complexity of the problems presented.  Given the aforementioned information, the predictability of an adverse outcome is felt to be  significant.   Lady Deutscher MD FACP [facility] Pager [phone]  How to contact the [facility] Attending or Consulting provider [place] or covering provider during after hours [place], for this patient?  1. Check the care team in [facility] and look for a) attending/consulting [facility] provider listed and b) the [facility] team listed 2. Log into [email or web address] and use [place]'s universal password to access. If you do not have the password, please contact the hospital operator. 3. Locate the [facility] provider you are looking for under [facility] and page to a number that you can be directly reached. 4. If you still have difficulty reaching the provider, please page the [facility] (Director on Call) for the Hospitalists listed on amion for assistance.  If 7PM-7AM, please contact night-coverage [email or web address] Password [facility]  01/19/2019, 7:21 PM

## 2019-01-19 NOTE — ED Provider Notes (Addendum)
[facility] EMERGENCY DEPARTMENT Provider Note   CSN: [id number] Arrival date & time: 01/19/19  6294    History   Chief Complaint Chief Complaint  Patient presents with   Tachycardia   Cough   Fever    HPI Katrina Benitez is a 73 y.o. female.     Pt presents to the ED today with fever, cough, sob.  The pt has had a cough for the past week.  She developed chills in the night and checked her temp when she woke up.  She had a temp of 102.  She took 650 mg tylenol times 2 at home.  She called EMS who gave her 324 mg ASA.  Her HR was also elevated.  She thinks she had 1 prior episode of afib, but it resolved en route to the doctor's office.  The pt is not on blood thinners.  The pt denies any specific pain.  No known Covid exposures.  Pt is a retired Engineer, drilling.     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   Idiopathic peripheral neuropathy 08/12/2018   Vitamin D deficiency 07/22/2018   Vulvar pruritus 04/16/2016   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   Opacity noted on imaging study    Chest pain 01/21/2016   SIADH (syndrome of inappropriate ADH production) ([place]) 01/21/2016   Hypertension 01/21/2016   Anxiety 01/21/2016   GERD (gastroesophageal reflux disease) 01/21/2016   Abnormality of gait 12/12/2015    Past Surgical History:  Procedure Laterality Date   BREAST BIOPSY Left    BREAST EXCISIONAL BIOPSY Left    benign    CERVICAL BIOPSY  W/ LOOP ELECTRODE EXCISION     CERVICAL POLYPECTOMY  02/2016   COLONOSCOPY     COLPOSCOPY     DILATION AND CURETTAGE OF UTERUS     HYSTEROSCOPY     LUMBAR DISC SURGERY     LUMBAR LAMINECTOMY     TONSILLECTOMY     UPPER GASTROINTESTINAL ENDOSCOPY       OB History    Gravida  0   Para  0   Term  0   Preterm  0   AB  0   Living  0     SAB  0   TAB  0   Ectopic  0   Multiple  0   Live Births  0            Home Medications    Prior to Admission medications   Medication Sig Start Date End Date Taking? Authorizing Provider  diazepam (VALIUM) 10 MG tablet TAKE ONE TABLET DAILY AS NEEDED Patient taking differently: Take 10 mg by mouth daily as needed for anxiety.  10/21/18  Yes Orma Flaming, MD  gabapentin (NEURONTIN) 600 MG tablet Take 1 tablet (600 mg total) by mouth 3 (three) times daily. 06/26/17  Yes Kathrynn Ducking, MD  hydrOXYzine (ATARAX/VISTARIL) 25 MG tablet Take 1 tablet (25 mg total) by mouth 3 (three) times daily as needed. 08/12/18  Yes Orma Flaming, MD  ibuprofen (ADVIL,MOTRIN) 200 MG tablet Take by mouth daily as needed. 01/22/16  Yes [provider]  ondansetron (ZOFRAN) 8 MG tablet Take 8 mg by mouth daily as needed for nausea.    Yes [provider]  Probiotic Product (ALIGN PO) Take by mouth daily.   Yes [provider]  zolpidem (AMBIEN) 10 MG tablet Take 1 tablet (10 mg total) by mouth at bedtime as needed. 01/14/19  Yes Orma Flaming, MD  citalopram (CELEXA) 20 MG tablet Take 1 tablet (20 mg total) by mouth daily. Patient not taking: Reported on 12/03/2018 06/26/17   Kathrynn Ducking, MD  cyclobenzaprine (FLEXERIL) 10 MG tablet Take 1 tablet (10 mg total) by mouth 3 (three) times daily as needed for muscle spasms. Patient not taking: Reported on 12/03/2018 08/27/18   Orma Flaming, MD  diphenoxylate-atropine (LOMOTIL) 2.5-0.025 MG tablet TAKE ONE (1) TABLET FOUR TIMES EACH DAY AS  NEEDED Patient not taking: Reported on 01/19/2019 08/18/18   Orma Flaming, MD  diphenoxylate-atropine (LOMOTIL) 2.5-0.025 MG tablet Take 1 tablet by mouth 4 (four) times daily as needed for diarrhea or loose stools. Reported on 10/04/2015 Patient not taking: Reported on 12/03/2018 08/18/18   Orma Flaming, MD  metoprolol succinate (TOPROL-XL) 25 MG 24 hr tablet Take 1 tablet (25 mg total) by mouth daily. Patient not taking: Reported on 12/03/2018 06/26/17   Kathrynn Ducking, MD  predniSONE (DELTASONE) 10 MG tablet Take 10 mg by mouth as directed. Completed regimen on 01-17-19 01/05/19   [provider]  valACYclovir (VALTREX) 1000 MG tablet TAKE ONE TABLET THREE TIMES DAILY Patient not taking: Reported on 12/03/2018 11/10/18   Orma Flaming, MD    Family History Family History  Problem Relation Age of Onset   Breast cancer Mother    Glaucoma Mother    Miscarriages / Stillbirths Mother    Heart attack Father    Depression Father    Early death Father    Heart disease Father    Tuberculosis Paternal Grandmother    Prostate cancer Paternal Grandfather    Depression Brother    Hypertension Brother    Colon cancer Neg Hx    Esophageal cancer Neg Hx    Rectal cancer Neg Hx    Stomach cancer Neg Hx     Social History Social History   Tobacco Use   Smoking status: Never Smoker   Smokeless tobacco: Never Used  Substance Use Topics   Alcohol use: Yes    Alcohol/week: 14.0 standard drinks    Types: 14 Glasses of wine per week   Drug use: No     Allergies   Fentanyl; Ciprofloxacin; Dilaudid [hydromorphone hcl]; Flagyl [metronidazole]; and Visipaque [iodixanol]   Review of Systems Review of Systems  Constitutional: Positive for fatigue and fever.  Respiratory: Positive for shortness of breath.   Cardiovascular: Positive for palpitations.  Neurological: Positive for weakness.  All other systems reviewed and are negative.    Physical Exam Updated  Vital Signs BP (!) 127/98    Pulse 94    Temp 98.5 F (36.9 C) (Oral)    Resp 19    Ht 5\' 6"  (1.676 m)    Wt 86.2 kg    LMP  (LMP Unknown)    SpO2 96%    BMI 30.67 kg/m  Physical Exam Vitals signs and nursing note reviewed.  Constitutional:      Appearance: Normal appearance.  HENT:     Head: Normocephalic and atraumatic.     Right Ear: External ear normal.     Left Ear: External ear normal.     Nose: Nose normal.     Mouth/Throat:     Mouth: Mucous membranes are dry.  Eyes:     Extraocular Movements: Extraocular movements intact.     Conjunctiva/sclera: Conjunctivae normal.     Pupils: Pupils are equal, round, and reactive to light.  Neck:     Musculoskeletal: Normal range of motion and neck supple.  Cardiovascular:     Rate and Rhythm: Tachycardia present. Rhythm irregular.     Pulses: Normal pulses.     Heart sounds: Normal heart sounds.  Pulmonary:     Effort: Pulmonary effort is normal.     Breath sounds: Normal breath sounds.  Abdominal:     General: Abdomen is flat. Bowel sounds are normal.  Musculoskeletal: Normal range of motion.  Skin:    General: Skin is warm.     Capillary Refill: Capillary refill takes less than 2 seconds.  Neurological:     General: No focal deficit present.     Mental Status: She is alert and oriented to person, place, and time.  Psychiatric:        Mood and Affect: Mood normal.        Behavior: Behavior normal.      ED Treatments / Results  Labs (all labs ordered are listed, but only abnormal results are displayed) Labs Reviewed  BASIC METABOLIC PANEL - Abnormal; Notable for the following components:      Result Value   Glucose, Bld 104 (*)    Creatinine, Ser 1.03 (*)    Calcium 8.6 (*)    GFR calc non Af Amer 54 (*)    All other components within normal limits  LACTIC ACID, PLASMA - Abnormal; Notable for the following components:   Lactic Acid, Venous 2.0 (*)    All other components within normal limits  SARS CORONAVIRUS 2  (HOSPITAL ORDER, [place] [facility])  CULTURE, BLOOD (ROUTINE X 2)  CULTURE, BLOOD (ROUTINE X 2)  URINE CULTURE  MAGNESIUM  CBC  TSH  PROTIME-INR  LACTIC ACID, PLASMA  URINALYSIS, ROUTINE W REFLEX MICROSCOPIC  HEPARIN LEVEL (UNFRACTIONATED)    EKG EKG Interpretation  Date/Time:  Wednesday January 19 2019 10:10:16 EDT Ventricular Rate:  89 PR Interval:    QRS Duration: 78 QT Interval:  333 QTC Calculation: 406 R Axis:   46 Text Interpretation:  Atrial fibrillation Since last tracing rate slower Confirmed by Isla Pence [phone]) on 01/19/2019 10:35:06 AM   Radiology Ct Chest Wo Contrast  Result Date: 01/19/2019 CLINICAL DATA:  Cough and shortness of breath. EXAM: CT CHEST WITHOUT CONTRAST TECHNIQUE: Multidetector CT imaging of the chest was performed following the standard protocol without IV contrast. COMPARISON:  One-view chest x-ray 01/19/2019. CT of the chest 08/01/2016. FINDINGS: Cardiovascular: The heart size is normal. Minimal atherosclerotic calcifications are present at the aortic arch. There is mild prominence the pulmonary arteries bilaterally. Mediastinum/Nodes: Subcentimeter mediastinal nodes mediastinal are present. No significant hilar or axillary adenopathy is present. Lungs/Pleura: Patchy peripheral airspace disease is present bilaterally. There is no lower lobe predominance. No significant consolidation is present. There is no cord vasculature. Bronchi are patent. No significant pleural effusion is present. Upper Abdomen: Within normal limits. Musculoskeletal: Vertebral body  heights alignment are maintained. No focal lytic or blastic lesions are present. IMPRESSION: 1. Patchy peripheral airspace disease involving the upper and lower lobes bilaterally. The findings are nonspecific. This raises concern for atypical infection. 2. Subcentimeter lymph nodes are likely reactive. Electronically Signed   By: San Morelle M.D.   On: 01/19/2019 12:04    Dg Chest Port 1 View  Result Date: 01/19/2019 CLINICAL DATA:  Cough, shortness of breath. EXAM: PORTABLE CHEST 1 VIEW COMPARISON:  Radiographs of Jan 22, 2016. CT scan of August 01, 2016. FINDINGS: Stable cardiomediastinal silhouette. No pneumothorax or pleural effusion is noted. Both lungs are clear. The visualized skeletal structures are unremarkable. IMPRESSION: No active disease. Electronically Signed   By: Marijo Conception M.D.   On: 01/19/2019 09:51    Procedures Procedures (including critical care time)  Medications Ordered in ED Medications  diltiazem (CARDIZEM) 1 mg/mL load via infusion 20 mg (20 mg Intravenous Bolus from Bag 01/19/19 1004)    And  diltiazem (CARDIZEM) 100 mg in dextrose 5% 177mL (1 mg/mL) infusion (5 mg/hr Intravenous New Bag/Given 01/19/19 1004)  adenosine (ADENOCARD) 6 MG/2ML injection 12 mg (0 mg Intravenous Hold 01/19/19 1013)  adenosine (ADENOCARD) 6 MG/2ML injection (has no administration in time range)  heparin bolus via infusion 4,000 Units (has no administration in time range)  heparin ADULT infusion 100 units/mL (25000 units/249mL sodium chloride 0.45%) (has no administration in time range)  cefTRIAXone (ROCEPHIN) 1 g in sodium chloride 0.9 % 100 mL IVPB (has no administration in time range)  azithromycin (ZITHROMAX) 500 mg in sodium chloride 0.9 % 250 mL IVPB (has no administration in time range)  sodium chloride 0.9 % bolus 1,000 mL (0 mLs Intravenous Stopped 01/19/19 1112)  ibuprofen (ADVIL) tablet 600 mg (600 mg Oral Given 01/19/19 0945)  metoprolol tartrate (LOPRESSOR) injection 5 mg (5 mg Intravenous Given 01/19/19 1007)  sodium chloride 0.9 % bolus 1,000 mL (1,000 mLs Intravenous New Bag/Given 01/19/19 1112)     Initial Impression / Assessment and Plan / ED Course  I have reviewed the triage vital signs and the nursing notes.  Pertinent labs & imaging results that were available during my care of the patient were reviewed by me and considered in  my medical decision making (see chart for details).   CHA2DS2/VAS Stroke Risk Points      N/A >= 2 Points: High Risk  1 - 1.99 Points: Medium Risk  0 Points: Low Risk    A final score could not be computed because of missing components.:   Last Change: N/A     This score determines the patient's risk of having a stroke if the  patient has atrial fibrillation.      This score is not applicable to this patient. Components are not  calculated.       Initial EKG showed afib with rvr vs SVT.  After 5 mg lopressor, HR dropped to the 70s and it is definitely afib.  She was put on a cardizem drip.  She is also started on heparin.   Due to fever, pt was checked for Covid which was negative.  CT chest shows bilateral pneumonia.  She was put on rocephin and zithromax.  Pt d/w Dr. Evangeline Gula (triad) who will admit.  Katrina Benitez was evaluated in Emergency Department on 01/19/2019 for the symptoms described in the history of present illness. She was evaluated in the context of the global COVID-19 pandemic, which necessitated consideration that the patient might be  at risk for infection with the SARS-CoV-2 virus that causes COVID-19. Institutional protocols and algorithms that pertain to the evaluation of patients at risk for COVID-19 are in a state of rapid change based on information released by regulatory bodies including the CDC and federal and state organizations. These policies and algorithms were followed during the patient's care in the ED.  CRITICAL CARE Performed by: Isla Pence   Total critical care time: 30 minutes  Critical care time was exclusive of separately billable procedures and treating other patients.  Critical care was necessary to treat or prevent imminent or life-threatening deterioration.  Critical care was time spent personally by me on the following activities: development of treatment plan with patient and/or surrogate as well as nursing, discussions with  consultants, evaluation of patient's response to treatment, examination of patient, obtaining history from patient or surrogate, ordering and performing treatments and interventions, ordering and review of laboratory studies, ordering and review of radiographic studies, pulse oximetry and re-evaluation of patient's condition.  Pt's husband updated by phone.  Final Clinical Impressions(s) / ED Diagnoses   Final diagnoses:  Atrial fibrillation with rapid ventricular response ([place])  Pneumonia of both lungs due to infectious organism, unspecified part of lung  Covid-19 Virus not Detected    ED Discharge Orders    None       Isla Pence, MD 01/19/19 1238    Isla Pence, MD 01/19/19 1246

## 2019-01-20 ENCOUNTER — Telehealth: Payer: Self-pay | Admitting: Family Medicine

## 2019-01-20 ENCOUNTER — Inpatient Hospital Stay (HOSPITAL_COMMUNITY): Payer: Medicare Other

## 2019-01-20 ENCOUNTER — Other Ambulatory Visit: Payer: Self-pay | Admitting: Family Medicine

## 2019-01-20 LAB — MAGNESIUM: Magnesium: 1.9 mg/dL (ref 1.7–2.4)

## 2019-01-20 LAB — LIPID PANEL
Cholesterol: 227 mg/dL — ABNORMAL HIGH (ref 0–200)
HDL: 118 mg/dL (ref >40)
LDL Cholesterol: 91 mg/dL (ref 0–99)
Total CHOL/HDL Ratio: 1.9 RATIO
Triglycerides: 91 mg/dL (ref <150)
VLDL: 18 mg/dL (ref 0–40)

## 2019-01-20 LAB — CBC
HCT: 35 % — ABNORMAL LOW (ref 36.0–46.0)
Hemoglobin: 11.6 g/dL — ABNORMAL LOW (ref 12.0–15.0)
MCH: 30.3 pg (ref 26.0–34.0)
MCHC: 33.1 g/dL (ref 30.0–36.0)
MCV: 91.4 fL (ref 80.0–100.0)
Platelets: 219 10*3/uL (ref 150–400)
RBC: 3.83 MIL/uL — ABNORMAL LOW (ref 3.87–5.11)
RDW: 14.6 % (ref 11.5–15.5)
WBC: 5.4 10*3/uL (ref 4.0–10.5)
nRBC: 0 % (ref 0.0–0.2)

## 2019-01-20 LAB — URINE CULTURE: Culture: NO GROWTH

## 2019-01-20 LAB — BASIC METABOLIC PANEL
Anion gap: 10 (ref 5–15)
BUN: 10 mg/dL (ref 8–23)
CO2: 24 mmol/L (ref 22–32)
Calcium: 8.3 mg/dL — ABNORMAL LOW (ref 8.9–10.3)
Chloride: 102 mmol/L (ref 98–111)
Creatinine, Ser: 0.99 mg/dL (ref 0.44–1.00)
GFR calc Af Amer: >60 mL/min (ref >60)
GFR calc non Af Amer: 57 mL/min — ABNORMAL LOW (ref >60)
Glucose, Bld: 95 mg/dL (ref 70–99)
Potassium: 4.3 mmol/L (ref 3.5–5.1)
Sodium: 136 mmol/L (ref 135–145)

## 2019-01-20 LAB — TROPONIN I
Troponin I: <0.03 ng/mL (ref <0.03)
Troponin I: <0.03 ng/mL (ref <0.03)

## 2019-01-20 LAB — GLUCOSE 6 PHOSPHATE DEHYDROGENASE
G6PDH: 9.8 U/g{Hb} (ref 4.6–13.5)
Hemoglobin: 11.3 g/dL (ref 11.1–15.9)

## 2019-01-20 MED ORDER — GUAIFENESIN-DM 100-10 MG/5ML PO SYRP
5.0000 mL | ORAL_SOLUTION | ORAL | Status: DC | PRN
  Administered 2019-01-20 (×4): 5 mL via ORAL
  Filled 2019-01-20 (×5): qty 5

## 2019-01-20 MED ORDER — GUAIFENESIN ER 600 MG PO TB12
600.0000 mg | ORAL_TABLET | Freq: Two times a day (BID) | ORAL | Status: DC
  Administered 2019-01-20 – 2019-01-21 (×3): 600 mg via ORAL
  Filled 2019-01-20 (×4): qty 1

## 2019-01-20 MED ORDER — SODIUM CHLORIDE 0.9 % IV SOLN
1.0000 g | INTRAVENOUS | Status: DC
  Administered 2019-01-20 – 2019-01-21 (×2): 1 g via INTRAVENOUS
  Filled 2019-01-20 (×4): qty 10

## 2019-01-20 MED ORDER — APIXABAN 5 MG PO TABS: 5.0000 mg | ORAL_TABLET | Freq: Two times a day (BID) | ORAL | 0 refills | Status: DC

## 2019-01-20 MED ORDER — ALBUTEROL SULFATE HFA 108 (90 BASE) MCG/ACT IN AERS
2.0000 | INHALATION_SPRAY | Freq: Two times a day (BID) | RESPIRATORY_TRACT | Status: DC
  Administered 2019-01-21: 2 via RESPIRATORY_TRACT
  Filled 2019-01-20: qty 6.7

## 2019-01-20 MED ORDER — ALBUTEROL SULFATE HFA 108 (90 BASE) MCG/ACT IN AERS
2.0000 | INHALATION_SPRAY | Freq: Four times a day (QID) | RESPIRATORY_TRACT | Status: DC | PRN
  Administered 2019-01-21: 2 via RESPIRATORY_TRACT
  Filled 2019-01-20 (×2): qty 6.7

## 2019-01-20 MED ORDER — BUTALBITAL-APAP-CAFFEINE 50-325-40 MG PO TABS
2.0000 | ORAL_TABLET | Freq: Once | ORAL | Status: AC
  Administered 2019-01-20: 2 via ORAL
  Filled 2019-01-20: qty 2

## 2019-01-20 MED ORDER — METOPROLOL TARTRATE 25 MG PO TABS: 25.0000 mg | ORAL_TABLET | Freq: Three times a day (TID) | ORAL | Status: DC

## 2019-01-20 MED ORDER — APIXABAN 5 MG PO TABS
5.0000 mg | ORAL_TABLET | Freq: Two times a day (BID) | ORAL | Status: DC
  Administered 2019-01-20 – 2019-01-21 (×2): 5 mg via ORAL
  Filled 2019-01-20 (×3): qty 1

## 2019-01-20 MED ORDER — ALBUTEROL SULFATE HFA 108 (90 BASE) MCG/ACT IN AERS
1.0000 | INHALATION_SPRAY | Freq: Three times a day (TID) | RESPIRATORY_TRACT | Status: DC
  Administered 2019-01-20 (×2): 1 via RESPIRATORY_TRACT
  Filled 2019-01-20: qty 6.7

## 2019-01-20 MED ORDER — METOPROLOL TARTRATE 50 MG PO TABS
50.0000 mg | ORAL_TABLET | Freq: Two times a day (BID) | ORAL | Status: DC
  Administered 2019-01-20 – 2019-01-21 (×3): 50 mg via ORAL
  Filled 2019-01-20 (×4): qty 1

## 2019-01-20 MED ORDER — PHENOL 1.4 % MT LIQD
1.0000 | OROMUCOSAL | Status: DC | PRN
  Administered 2019-01-20: 1 via OROMUCOSAL
  Filled 2019-01-20: qty 177

## 2019-01-20 MED ORDER — MENTHOL 3 MG MT LOZG
1.0000 | LOZENGE | OROMUCOSAL | Status: DC | PRN
  Administered 2019-01-20: 3 mg via ORAL
  Filled 2019-01-20: qty 9

## 2019-01-20 MED FILL — ELIQUIS 5 MG TABLET: 5 | 30 days supply | Qty: 60 | Fill #0

## 2019-01-20 NOTE — Progress Notes (Signed)
Progress Note  Patient Name: Katrina Benitez Date of Encounter: 01/20/2019  Primary Cardiologist: Sherren Mocha, MD   Subjective   Feels some 'airway distress.' C/O cough, sore throat.  Inpatient Medications    Scheduled Meds: . acidophilus  2 capsule Oral Daily  . enoxaparin (LOVENOX) injection  85 mg Subcutaneous Q12H  . folic acid  1 mg Oral Daily  . gabapentin  600 mg Oral TID  . metoprolol tartrate  25 mg Oral BID  . multivitamin with minerals  1 tablet Oral Daily  . sodium chloride flush  3 mL Intravenous Q12H  . thiamine  100 mg Oral Daily   Or  . thiamine  100 mg Intravenous Daily   Continuous Infusions: . sodium chloride    . 0.9 % NaCl with KCl 20 mEq / L 75 mL/hr at 01/19/19 1823  . azithromycin    . diltiazem (CARDIZEM) infusion 5 mg/hr (01/19/19 2048)   PRN Meds: sodium chloride, acetaminophen, diazepam, guaiFENesin-dextromethorphan, LORazepam **OR** LORazepam, ondansetron, sodium chloride flush, zolpidem   Vital Signs    Vitals:   01/20/19 0400 01/20/19 0530 01/20/19 0600 01/20/19 0630  BP: (!) 147/67     Pulse: 80 78 74 79  Resp: 19 20 (!) 22 16  Temp: 98.7 F (37.1 C)     TempSrc: Oral     SpO2: 99% 97% 94% 98%  Weight:      Height:        Intake/Output Summary (Last 24 hours) at 01/20/2019 0752 Last data filed at 01/20/2019 0400 Gross per 24 hour  Intake 3180.47 ml  Output -  Net 3180.47 ml   Filed Weights   01/19/19 0928 01/19/19 1445 01/20/19 0155  Weight: 86.2 kg 90.4 kg 90.8 kg    Telemetry    Atrial fibrillation ---> sinus rhythm yesterday evening, maintaining sinus rhythm this am - Personally Reviewed  ECG    Sinus rhythm, 74 bpm, within normal limits  - Personally Reviewed  Physical Exam   Because of the COVID-19 pandemic in this patient's high clinical suspicion, face-to-face examination is not performed.  The patient is conversant without obvious shortness of breath during our discussion.  Labs    Chemistry Recent  Labs  Lab 01/19/19 0931 01/20/19 0500  NA 135 136  K 4.3 4.3  CL 99 102  CO2 24 24  GLUCOSE 104* 95  BUN 13 10  CREATININE 1.03* 0.99  CALCIUM 8.6* 8.3*  GFRNONAA 54* 57*  GFRAA >60 >60  ANIONGAP 12 10     Hematology Recent Labs  Lab 01/19/19 0931 01/20/19 0500  WBC 7.8 5.4  RBC 4.03 3.83*  HGB 12.1 11.6*  HCT 36.3 35.0*  MCV 90.1 91.4  MCH 30.0 30.3  MCHC 33.3 33.1  RDW 14.4 14.6  PLT 227 219    Cardiac Enzymes Recent Labs  Lab 01/19/19 1621 01/19/19 2304 01/20/19 0500  TROPONINI 0.03* <0.03 <0.03   No results for input(s): TROPIPOC in the last 168 hours.   BNP Recent Labs  Lab 01/19/19 1621  BNP 583.4*     DDimer  Recent Labs  Lab 01/19/19 1621  DDIMER 0.77*     Radiology    Ct Chest Wo Contrast  Result Date: 01/19/2019 CLINICAL DATA:  Cough and shortness of breath. EXAM: CT CHEST WITHOUT CONTRAST TECHNIQUE: Multidetector CT imaging of the chest was performed following the standard protocol without IV contrast. COMPARISON:  One-view chest x-ray 01/19/2019. CT of the chest 08/01/2016. FINDINGS: Cardiovascular: The heart  size is normal. Minimal atherosclerotic calcifications are present at the aortic arch. There is mild prominence the pulmonary arteries bilaterally. Mediastinum/Nodes: Subcentimeter mediastinal nodes mediastinal are present. No significant hilar or axillary adenopathy is present. Lungs/Pleura: Patchy peripheral airspace disease is present bilaterally. There is no lower lobe predominance. No significant consolidation is present. There is no cord vasculature. Bronchi are patent. No significant pleural effusion is present. Upper Abdomen: Within normal limits. Musculoskeletal: Vertebral body heights alignment are maintained. No focal lytic or blastic lesions are present. IMPRESSION: 1. Patchy peripheral airspace disease involving the upper and lower lobes bilaterally. The findings are nonspecific. This raises concern for atypical infection. 2.  Subcentimeter lymph nodes are likely reactive. Electronically Signed   By: San Morelle M.D.   On: 01/19/2019 12:04   Dg Chest Port 1 View  Result Date: 01/19/2019 CLINICAL DATA:  Cough, shortness of breath. EXAM: PORTABLE CHEST 1 VIEW COMPARISON:  Radiographs of Jan 22, 2016. CT scan of August 01, 2016. FINDINGS: Stable cardiomediastinal silhouette. No pneumothorax or pleural effusion is noted. Both lungs are clear. The visualized skeletal structures are unremarkable. IMPRESSION: No active disease. Electronically Signed   By: Marijo Conception M.D.   On: 01/19/2019 09:51   Vas [place] Lower Extremity Venous (dvt) (only Mc & Wl 7a-7p)  Result Date: 01/19/2019  Lower Venous Study Indications: Edema.  Performing Technologist: Abram Sander RVS  Examination Guidelines: A complete evaluation includes B-mode imaging, spectral Doppler, color Doppler, and power Doppler as needed of all accessible portions of each vessel. Bilateral testing is considered an integral part of a complete examination. Limited examinations for reoccurring indications may be performed as noted.  +---------+---------------+---------+-----------+----------+-------+ RIGHT    CompressibilityPhasicitySpontaneityPropertiesSummary +---------+---------------+---------+-----------+----------+-------+ CFV      Full           Yes      Yes                          +---------+---------------+---------+-----------+----------+-------+ SFJ      Full                                                 +---------+---------------+---------+-----------+----------+-------+ FV Prox  Full                                                 +---------+---------------+---------+-----------+----------+-------+ FV Mid   Full                                                 +---------+---------------+---------+-----------+----------+-------+ FV DistalFull                                                  +---------+---------------+---------+-----------+----------+-------+ PFV      Full                                                 +---------+---------------+---------+-----------+----------+-------+  POP      Full           Yes      Yes                          +---------+---------------+---------+-----------+----------+-------+ PTV      Full                                                 +---------+---------------+---------+-----------+----------+-------+ PERO     Full                                                 +---------+---------------+---------+-----------+----------+-------+   +---------+---------------+---------+-----------+----------+-------+ LEFT     CompressibilityPhasicitySpontaneityPropertiesSummary +---------+---------------+---------+-----------+----------+-------+ CFV      Full           Yes      Yes                          +---------+---------------+---------+-----------+----------+-------+ SFJ      Full                                                 +---------+---------------+---------+-----------+----------+-------+ FV Prox  Full                                                 +---------+---------------+---------+-----------+----------+-------+ FV Mid   Full                                                 +---------+---------------+---------+-----------+----------+-------+ FV DistalFull                                                 +---------+---------------+---------+-----------+----------+-------+ PFV      Full                                                 +---------+---------------+---------+-----------+----------+-------+ POP      Full           Yes      Yes                          +---------+---------------+---------+-----------+----------+-------+ PTV      Full                                                 +---------+---------------+---------+-----------+----------+-------+ PERO  Full                                                  +---------+---------------+---------+-----------+----------+-------+     Summary: Right: There is no evidence of deep vein thrombosis in the lower extremity. No cystic structure found in the popliteal fossa. Left: There is no evidence of deep vein thrombosis in the lower extremity. No cystic structure found in the popliteal fossa.  *See table(s) above for measurements and observations. Electronically signed by Curt Jews MD on 01/19/2019 at 5:16:06 PM.    Final     Cardiac Studies   Echocardiogram 2018: Study Conclusions  - Left ventricle: The cavity size was normal. Wall thickness was normal. Systolic function was normal. The estimated ejection fraction was in the range of 60% to 65%. Wall motion was normal; there were no regional wall motion abnormalities. Doppler parameters are consistent with abnormal left ventricular relaxation (grade 1 diastolic dysfunction). - Aortic valve: Trileaflet; mildly calcified leaflets. There was no stenosis. There was mild regurgitation. - Aorta: Ascending aortic diameter: 41 mm (S). - Ascending aorta: The ascending aorta was mildly dilated. - Mitral valve: There was mild regurgitation. - Left atrium: The atrium was mildly dilated. - Right ventricle: The cavity size was normal. Systolic function was normal. - Right atrium: The atrium was mildly dilated. - Tricuspid valve: Peak RV-RA gradient (S): 29 mm Hg. - Pulmonary arteries: PA peak pressure: 32 mm Hg (S). - Inferior vena cava: The vessel was normal in size. The respirophasic diameter changes were in the normal range (>= 50%), consistent with normal central venous pressure.  Impressions:  - Normal LV size with EF 60-65%. Normal RV size and systolic function. Mild aortic insufficiency. Mild mitral regurgitation.  NST 2017: IMPRESSION: 1. No reversible ischemia or infarction.  2. Normal left ventricular wall motion.   3. Left ventricular ejection fraction 66%  4. Low-risk stress test findings*.   Patient Profile     73 y.o. female with a PMH of HTN, mild aortic insufficiency, ideopathic peripheral neuropathy, herpes zoster c/b postherpetic neuralgia, and SIADH, who is being followed by cardiology for the evaluation of atrial fibrillation with RVR   Assessment & Plan    1. New onset atrial fibrillation with RVR: patient presented with fever, cough, SOB, and palpitations. EKG with atrial fibrillation with RVR, rate 167. HR improved with IV metoprolol > diltiazem gtt and po metoprolol 25mg  BID. She was started on a heparin gtt > lovenox for stroke ppx. She converted to sinus rhythm overnight.   - Echo pending once COVID 19 send out test returns - Continue metoprolol  - Stop diltiazem gtt  - uptitrate metoprolol  - Continue therapeutic lovenox for stroke ppx - transition to eliquis 5mg  BID once it is clear that no further procedures are needed  2. PNA: patient presented with cough, fever, and SOB. Found to have bilateral PNA on CT Chest c/f atypical infection. In house COVID-19 testing negative; send out test pending. On IV antiobiotics. Suspect infection is driving #1. - Continue management per primary team  3. HTN: hypotension improved from yesterday.  - Controlled with management for #1  For questions or updates, please contact [place] Please consult [email or web address] for contact info under Cardiology/STEMI.     Signed, Abigail Butts, PA-C  01/20/2019, 7:52 AM   [phone]  Patient seen,  examined. Available data reviewed. Agree with findings, assessment, and plan as outlined by Roby Lofts, PA-C. The patient is evaluated using a video consultation in light of the Covid-19 pandemic. Her Covid lab test is pending. Fortunately she converted from AF to sinus rhythm yesterday evening. She is not currently on O2. Recommend increasing metoprolol to 50 mg BID. Stop IV diltiazem (done).  Transition lovenox to eliquis. Eliquis copay $350 but her treatment might be limited to 1 month since AF has occurred in the setting of febrile illness and she is back in sinus rhythm. If she is ready for discharge before the echo is done, this can be completed as an outpatient. Will follow-up in am.   Sherren Mocha, M.D. 01/20/2019 12:38 PM

## 2019-01-20 NOTE — Discharge Instructions (Addendum)
1) you are taking apixaban/Eliquis which is a blood thinner for stroke prophylaxis in the setting of atrial fibrillation so Avoid  ibuprofen/Advil/Aleve/Motrin/Goody Powders/Naproxen/BC powders/Meloxicam/Diclofenac/Indomethacin and other Nonsteroidal anti-inflammatory medications as these will make you more likely to bleed and can cause stomach ulcers, can also cause Kidney problems.   2) please take cough medicines, antibiotics and inhalers as prescribed for pneumonia and shortness of breath  3) follow-up to primary care physician for reevaluation and recheck within a week   Information on my medicine - ELIQUIS (apixaban)  Why was Eliquis prescribed for you? Eliquis was prescribed for you to reduce the risk of forming blood clots that can cause a stroke if you have a medical condition called atrial fibrillation (a type of irregular heartbeat) OR to reduce the risk of a blood clots forming after orthopedic surgery.  What do You need to know about Eliquis ? Take your Eliquis TWICE DAILY - one tablet in the morning and one tablet in the evening with or without food.  It would be best to take the doses about the same time each day.  If you have difficulty swallowing the tablet whole please discuss with your pharmacist how to take the medication safely.  Take Eliquis exactly as prescribed by your doctor and DO NOT stop taking Eliquis without talking to the doctor who prescribed the medication.  Stopping may increase your risk of developing a new clot or stroke.  Refill your prescription before you run out.  After discharge, you should have regular check-up appointments with your healthcare provider that is prescribing your Eliquis.  In the future your dose may need to be changed if your kidney function or weight changes by a significant amount or as you get older.  What do you do if you miss a dose? If you miss a dose, take it as soon as you remember on the same day and resume taking twice  daily.  Do not take more than one dose of ELIQUIS at the same time.  Important Safety Information A possible side effect of Eliquis is bleeding. You should call your healthcare provider right away if you experience any of the following: ? Bleeding from an injury or your nose that does not stop. ? Unusual colored urine (red or dark brown) or unusual colored stools (red or black). ? Unusual bruising for unknown reasons. ? A serious fall or if you hit your head (even if there is no bleeding).  Some medicines may interact with Eliquis and might increase your risk of bleeding or clotting while on Eliquis. To help avoid this, consult your healthcare provider or pharmacist prior to using any new prescription or non-prescription medications, including herbals, vitamins, non-steroidal anti-inflammatory drugs (NSAIDs) and supplements.  This website has more information on Eliquis (apixaban): [email or web address].  1) you are taking apixaban/Eliquis which is a blood thinner for stroke prophylaxis in the setting of atrial fibrillation so Avoid  ibuprofen/Advil/Aleve/Motrin/Goody Powders/Naproxen/BC powders/Meloxicam/Diclofenac/Indomethacin and other Nonsteroidal anti-inflammatory medications as these will make you more likely to bleed and can cause stomach ulcers, can also cause Kidney problems.   2) please take cough medicines, antibiotics and inhalers as prescribed for pneumonia and shortness of breath  3) follow-up to primary care physician for reevaluation and recheck within a week

## 2019-01-20 NOTE — Progress Notes (Signed)
Patient Demographics:    Katrina Benitez, is a 73 y.o. female, DOB - 1945-12-26, ZOX:[id number]  Admit date - 01/19/2019   Admitting Physician Lady Deutscher, MD  Outpatient Primary MD for the patient is Orma Flaming, MD  LOS - 1   Chief Complaint  Patient presents with   Tachycardia   Cough   Fever        Subjective:    Katrina Benitez today has no fevers, no emesis,  No chest pain, cough and shortness of breath slowly improving,   Assessment  & Plan :    Principal Problem:   Atrial fibrillation with RVR (HCC) Active Problems:   SIADH (syndrome of inappropriate ADH production) (HCC)   Hypertension   Anxiety   GERD (gastroesophageal reflux disease)   Post herpetic neuralgia   Hyperlipidemia   Influenza-like illness   Atypical pneumonia   Edema of left lower extremity   Alcohol use  Brief Summary:- 73 y.o. female with a PMH of HTN, anxiety disorder, GERD, mild aortic insufficiency, ideopathic peripheral neuropathy, herpes zoster c/b postherpetic neuralgia, and SIADH, who is being seen today for the evaluation of atrial fibrillation with RVR    A/p 1)New Onset AFib/PAFib----patient appears to be back in sinus rhythm, CHA2DS2-VASc Score  3 (HTN, age, Female)  equal to 3.2 % stroke risk rate/year... Switch from Lovenox to apixaban for stroke prophylaxis, increase metoprolol to 50 mg twice daily for rate control, repeat echo after standard COVID-19 test is back if negative... Cardiology consult appreciated.  Stop IV Cardizem drip  2)Bilateral pneumonia----rapid COVID-19 test negative, send out COVID-19- pending, continue azithromycin and Rocephin  3) alcohol use----continue Lorazepam per CIWA protocol, continue multivitamin and folic acid  4) anxiety disorder--- stable continue Vistaril and benzos  5) SIADH--- stable at this time, avoid excessive free water  6) acute hypoxic respiratory  failure--- secondary to bilateral pneumonia as above #2 in the setting of A. fib with RVR, hypoxia resolving,   Disposition/Need for in-Hospital Stay- patient unable to be discharged at this time due to possible COVID-19 infection....Marland Kitchen Awaiting COVID-19 sanitized, requiring IV antibiotics for pneumonia, hypoxia is resolving  Code Status : Full code  Family Communication:   husband   Disposition Plan  : TBD  Consults  : Cardiology  DVT Prophylaxis  : Switch from Lovenox to apixaban  Lab Results  Component Value Date   PLT 219 01/20/2019    Inpatient Medications  Scheduled Meds:  acidophilus  2 capsule Oral Daily   albuterol  1 puff Inhalation TID   apixaban  5 mg Oral BID   folic acid  1 mg Oral Daily   gabapentin  600 mg Oral TID   guaiFENesin  600 mg Oral BID   metoprolol tartrate  50 mg Oral BID   multivitamin with minerals  1 tablet Oral Daily   sodium chloride flush  3 mL Intravenous Q12H   thiamine  100 mg Oral Daily   Or   thiamine  100 mg Intravenous Daily   Continuous Infusions:  sodium chloride     azithromycin 250 mL/hr at 01/20/19 1457   cefTRIAXone (ROCEPHIN)  IV 20 mL/hr at 01/20/19 1457   PRN Meds:.sodium chloride, acetaminophen, diazepam, guaiFENesin-dextromethorphan, LORazepam **OR** LORazepam, ondansetron, sodium  chloride flush, zolpidem    Anti-infectives (From admission, onward)   Start     Dose/Rate Route Frequency Ordered Stop   01/20/19 1400  azithromycin (ZITHROMAX) 500 mg in sodium chloride 0.9 % 250 mL IVPB     500 mg 250 mL/hr over 60 Minutes Intravenous Every 24 hours 01/19/19 1425 01/24/19 1359   01/20/19 1300  cefTRIAXone (ROCEPHIN) 1 g in sodium chloride 0.9 % 100 mL IVPB     1 g 200 mL/hr over 30 Minutes Intravenous Every 24 hours 01/20/19 0756     01/19/19 1215  cefTRIAXone (ROCEPHIN) 1 g in sodium chloride 0.9 % 100 mL IVPB     1 g 200 mL/hr over 30 Minutes Intravenous  Once 01/19/19 1208 01/19/19 1424   01/19/19  1215  azithromycin (ZITHROMAX) 500 mg in sodium chloride 0.9 % 250 mL IVPB     500 mg 250 mL/hr over 60 Minutes Intravenous  Once 01/19/19 1208 01/19/19 1433        Objective:   Vitals:   01/20/19 0600 01/20/19 0630 01/20/19 0829 01/20/19 1419  BP:   (!) 118/43 135/66  Pulse: 74 79 79 67  Resp: (!) 22 16 (!) 23   Temp:   98.5 F (36.9 C)   TempSrc:   Oral   SpO2: 94% 98% 92%   Weight:      Height:        Wt Readings from Last 3 Encounters:  01/20/19 90.8 kg  10/01/18 80.7 kg  08/12/18 83 kg    Intake/Output Summary (Last 24 hours) at 01/20/2019 1704 Last data filed at 01/20/2019 1457 Gross per 24 hour  Intake 1138.69 ml  Output --  Net 1138.69 ml    Physical Exam Patient is examined daily including today on 01/20/19 , exams remain the same as of yesterday except that has changed   Gen:- Awake Alert,  In no apparent distress  HEENT:- [place].AT, No sclera icterus Neck-Supple Neck,No JVD,.  Lungs-  CTAB , fair symmetrical air movement CV- S1, S2 normal, regular (was previously in A. fib with RVR) Abd-  +ve B.Sounds, Abd Soft, No tenderness,    Extremity/Skin:- No  edema, pedal pulses present  Psych-affect is appropriate, oriented x3 Neuro-no new focal deficits, no tremors   Data Review:   Micro Results Recent Results (from the past 240 hour(s))  Culture, blood (routine x 2)     Status: None (Preliminary result)   Collection Time: 01/19/19  9:33 AM  Result Value Ref Range Status   Specimen Description BLOOD RIGHT HAND  Final   Special Requests   Final    BOTTLES DRAWN AEROBIC AND ANAEROBIC Blood Culture adequate volume   Culture   Final    NO GROWTH < 24 HOURS Performed at [facility], [place] [address], [place], [place] [address]    Report Status PENDING  Incomplete  SARS Coronavirus 2 [facility] order, Performed in [place] hospital lab)     Status: None   Collection Time: 01/19/19  9:37 AM  Result Value Ref Range Status   SARS Coronavirus 2 NEGATIVE  NEGATIVE Final    Comment: (NOTE) If result is NEGATIVE SARS-CoV-2 target nucleic acids are NOT DETECTED. The SARS-CoV-2 RNA is generally detectable in upper and lower  respiratory specimens during the acute phase of infection. The lowest  concentration of SARS-CoV-2 viral copies this assay can detect is 250  copies / mL. A negative result does not preclude SARS-CoV-2 infection  and should not  be used as the sole basis for treatment or other  patient management decisions.  A negative result may occur with  improper specimen collection / handling, submission of specimen other  than nasopharyngeal swab, presence of viral mutation(s) within the  areas targeted by this assay, and inadequate number of viral copies  (<250 copies / mL). A negative result must be combined with clinical  observations, patient history, and epidemiological information. If result is POSITIVE SARS-CoV-2 target nucleic acids are DETECTED. The SARS-CoV-2 RNA is generally detectable in upper and lower  respiratory specimens dur ing the acute phase of infection.  Positive  results are indicative of active infection with SARS-CoV-2.  Clinical  correlation with patient history and other diagnostic information is  necessary to determine patient infection status.  Positive results do  not rule out bacterial infection or co-infection with other viruses. If result is PRESUMPTIVE POSTIVE SARS-CoV-2 nucleic acids MAY BE PRESENT.   A presumptive positive result was obtained on the submitted specimen  and confirmed on repeat testing.  While 2019 novel coronavirus  (SARS-CoV-2) nucleic acids may be present in the submitted sample  additional confirmatory testing may be necessary for epidemiological  and / or clinical management purposes  to differentiate between  SARS-CoV-2 and other Sarbecovirus currently known to infect humans.  If clinically indicated additional testing with an alternate test  methodology [phone]) is  advised. The SARS-CoV-2 RNA is generally  detectable in upper and lower respiratory sp ecimens during the acute  phase of infection. The expected result is Negative. Fact Sheet for Patients:  StrictlyIdeas.no Fact Sheet for Healthcare Providers: BankingDealers.co.za This test is not yet approved or cleared by the [place] FDA and has been authorized for detection and/or diagnosis of SARS-CoV-2 by FDA under an Emergency Use Authorization (EUA).  This EUA will remain in effect (meaning this test can be used) for the duration of the COVID-19 declaration under Section 564(b)(1) of the Act, 21 U.S.C. section 360bbb-3(b)(1), unless the authorization is terminated or revoked sooner. Performed at [facility], [place] [address] [place], [place] [address]   Respiratory Panel by PCR     Status: None   Collection Time: 01/19/19  9:37 AM  Result Value Ref Range Status   Adenovirus NOT DETECTED NOT DETECTED Final   Coronavirus 229E NOT DETECTED NOT DETECTED Final    Comment: (NOTE) The Coronavirus on the Respiratory Panel, DOES NOT test for the novel  Coronavirus (2019 nCoV)    Coronavirus HKU1 NOT DETECTED NOT DETECTED Final   Coronavirus NL63 NOT DETECTED NOT DETECTED Final   Coronavirus OC43 NOT DETECTED NOT DETECTED Final   Metapneumovirus NOT DETECTED NOT DETECTED Final   Rhinovirus / Enterovirus NOT DETECTED NOT DETECTED Final   Influenza A NOT DETECTED NOT DETECTED Final   Influenza B NOT DETECTED NOT DETECTED Final   Parainfluenza Virus 1 NOT DETECTED NOT DETECTED Final   Parainfluenza Virus 2 NOT DETECTED NOT DETECTED Final   Parainfluenza Virus 3 NOT DETECTED NOT DETECTED Final   Parainfluenza Virus 4 NOT DETECTED NOT DETECTED Final   Respiratory Syncytial Virus NOT DETECTED NOT DETECTED Final   Bordetella pertussis NOT DETECTED NOT DETECTED Final   Chlamydophila pneumoniae NOT DETECTED NOT DETECTED Final   Mycoplasma  pneumoniae NOT DETECTED NOT DETECTED Final    Comment: Performed at [facility], [place]. [address], [place], [place] [address]  Culture, blood (routine x 2)     Status: None (Preliminary result)   Collection Time:  01/19/19  9:50 AM  Result Value Ref Range Status   Specimen Description BLOOD RIGHT ANTECUBITAL  Final   Special Requests   Final    BOTTLES DRAWN AEROBIC AND ANAEROBIC Blood Culture adequate volume   Culture   Final    NO GROWTH < 24 HOURS Performed at [facility], [address] [place], [place] [address]    Report Status PENDING  Incomplete  MRSA PCR Screening     Status: None   Collection Time: 01/19/19  3:45 PM  Result Value Ref Range Status   MRSA by PCR NEGATIVE NEGATIVE Final    Comment:        The GeneXpert MRSA Assay (FDA approved for NASAL specimens only), is one component of a comprehensive MRSA colonization surveillance program. It is not intended to diagnose MRSA infection nor to guide or monitor treatment for MRSA infections. Performed at [facility], [place] [address], [place], [place] [address]   Urine culture     Status: None   Collection Time: 01/19/19  6:26 PM  Result Value Ref Range Status   Specimen Description URINE, CLEAN CATCH  Final   Special Requests NONE  Final   Culture   Final    NO GROWTH Performed at [facility], [place] [address], [place], [place] [address]    Report Status 01/20/2019 FINAL  Final    Radiology Reports Ct Chest Wo Contrast  Result Date: 01/19/2019 CLINICAL DATA:  Cough and shortness of breath. EXAM: CT CHEST WITHOUT CONTRAST TECHNIQUE: Multidetector CT imaging of the chest was performed following the standard protocol without IV contrast. COMPARISON:  One-view chest x-ray 01/19/2019. CT of the chest 08/01/2016. FINDINGS: Cardiovascular: The heart size is normal. Minimal atherosclerotic calcifications are present at the aortic arch. There is mild prominence the pulmonary arteries bilaterally.  Mediastinum/Nodes: Subcentimeter mediastinal nodes mediastinal are present. No significant hilar or axillary adenopathy is present. Lungs/Pleura: Patchy peripheral airspace disease is present bilaterally. There is no lower lobe predominance. No significant consolidation is present. There is no cord vasculature. Bronchi are patent. No significant pleural effusion is present. Upper Abdomen: Within normal limits. Musculoskeletal: Vertebral body heights alignment are maintained. No focal lytic or blastic lesions are present. IMPRESSION: 1. Patchy peripheral airspace disease involving the upper and lower lobes bilaterally. The findings are nonspecific. This raises concern for atypical infection. 2. Subcentimeter lymph nodes are likely reactive. Electronically Signed   By: San Morelle M.D.   On: 01/19/2019 12:04   Portable Chest 1 View  Result Date: 01/20/2019 CLINICAL DATA:  Short of breath EXAM: PORTABLE CHEST 1 VIEW COMPARISON:  01/19/2019 FINDINGS: Normal heart size. Mild hazy airspace disease at the lateral left base. No pneumothorax. No pleural effusion. IMPRESSION: Mild hazy airspace disease at the lateral left base. Electronically Signed   By: Marybelle Killings M.D.   On: 01/20/2019 07:57   Dg Chest Port 1 View  Result Date: 01/19/2019 CLINICAL DATA:  Cough, shortness of breath. EXAM: PORTABLE CHEST 1 VIEW COMPARISON:  Radiographs of Jan 22, 2016. CT scan of August 01, 2016. FINDINGS: Stable cardiomediastinal silhouette. No pneumothorax or pleural effusion is noted. Both lungs are clear. The visualized skeletal structures are unremarkable. IMPRESSION: No active disease. Electronically Signed   By: Marijo Conception M.D.   On: 01/19/2019 09:51   Vas [place] Lower Extremity Venous (dvt) (only Mc & Wl 7a-7p)  Result Date: 01/19/2019  Lower Venous Study Indications: Edema.  Performing Technologist: Abram Sander RVS  Examination Guidelines: A complete evaluation includes B-mode imaging, spectral Doppler,  color Doppler, and power Doppler as needed of all accessible portions of each vessel. Bilateral testing is considered an integral part of a complete examination. Limited examinations for reoccurring indications may be performed as noted.  +---------+---------------+---------+-----------+----------+-------+  RIGHT     Compressibility Phasicity Spontaneity Properties Summary  +---------+---------------+---------+-----------+----------+-------+  CFV       Full            Yes       Yes                             +---------+---------------+---------+-----------+----------+-------+  SFJ       Full                                                      +---------+---------------+---------+-----------+----------+-------+  FV Prox   Full                                                      +---------+---------------+---------+-----------+----------+-------+  FV Mid    Full                                                      +---------+---------------+---------+-----------+----------+-------+  FV Distal Full                                                      +---------+---------------+---------+-----------+----------+-------+  PFV       Full                                                      +---------+---------------+---------+-----------+----------+-------+  POP       Full            Yes       Yes                             +---------+---------------+---------+-----------+----------+-------+  PTV       Full                                                      +---------+---------------+---------+-----------+----------+-------+  PERO      Full                                                      +---------+---------------+---------+-----------+----------+-------+   +---------+---------------+---------+-----------+----------+-------+  LEFT      Compressibility Phasicity Spontaneity Properties Summary  +---------+---------------+---------+-----------+----------+-------+  CFV       Full            Yes       Yes                              +---------+---------------+---------+-----------+----------+-------+  SFJ       Full                                                      +---------+---------------+---------+-----------+----------+-------+  FV Prox   Full                                                      +---------+---------------+---------+-----------+----------+-------+  FV Mid    Full                                                      +---------+---------------+---------+-----------+----------+-------+  FV Distal Full                                                      +---------+---------------+---------+-----------+----------+-------+  PFV       Full                                                      +---------+---------------+---------+-----------+----------+-------+  POP       Full            Yes       Yes                             +---------+---------------+---------+-----------+----------+-------+  PTV       Full                                                      +---------+---------------+---------+-----------+----------+-------+  PERO      Full                                                      +---------+---------------+---------+-----------+----------+-------+     Summary: Right: There is no evidence of deep vein thrombosis in the lower extremity. No cystic structure found in the popliteal fossa. Left: There is no evidence of deep vein thrombosis in the lower extremity. No cystic structure  found in the popliteal fossa.  *See table(s) above for measurements and observations. Electronically signed by Curt Jews MD on 01/19/2019 at 5:16:06 PM.    Final      CBC Recent Labs  Lab 01/19/19 0931 01/19/19 1621 01/20/19 0500  WBC 7.8  --  5.4  HGB 12.1 11.3 11.6*  HCT 36.3  --  35.0*  PLT 227  --  219  MCV 90.1  --  91.4  MCH 30.0  --  30.3  MCHC 33.3  --  33.1  RDW 14.4  --  14.6    Chemistries  Recent Labs  Lab 01/19/19 0931 01/20/19 0500  NA 135 136  K 4.3 4.3  CL 99 102  CO2 24 24    GLUCOSE 104* 95  BUN 13 10  CREATININE 1.03* 0.99  CALCIUM 8.6* 8.3*  MG 1.8 1.9   ------------------------------------------------------------------------------------------------------------------ Recent Labs    01/20/19 0500  CHOL 227*  HDL 118  LDLCALC 91  TRIG 91  CHOLHDL 1.9    No results found for: HGBA1C ------------------------------------------------------------------------------------------------------------------ Recent Labs    01/19/19 1621  TSH 1.006   ------------------------------------------------------------------------------------------------------------------ Recent Labs    01/19/19 1621  FERRITIN 29    Coagulation profile Recent Labs  Lab 01/19/19 0931  INR 1.0    Recent Labs    01/19/19 1621  DDIMER 0.77*    Cardiac Enzymes Recent Labs  Lab 01/19/19 1621 01/19/19 2304 01/20/19 0500  TROPONINI 0.03* <0.03 <0.03   ------------------------------------------------------------------------------------------------------------------    Component Value Date/Time   BNP 583.4 (H) 01/19/2019 1621    Katoya Amato M.D on 01/20/2019 at 5:04 PM  Go to [email or web address] - for contact info  [facility] - Office  [phone]

## 2019-01-20 NOTE — Telephone Encounter (Signed)
Refill request for Diazepam sent to Dr. Rogers Blocker for approval

## 2019-01-20 NOTE — Telephone Encounter (Signed)
Copied from [place] [id number]. Topic: Quick Communication - Rx Refill/Question >> Jan 20, 2019  2:59 PM Blase Mess A wrote: Medication: diazepam (VALIUM) 10 MG tablet [[id number]]  Has the patient contacted their pharmacy? Yes  (Agent: If no, request that the patient contact the pharmacy for the refill.) (Agent: If yes, when and what did the pharmacy advise?)  Preferred Pharmacy (with phone number or street name): [place], [place] - [address] [phone] (Phone) [phone] (Fax)     Agent: Please be advised that RX refills may take up to 3 business days. We ask that you follow-up with your pharmacy.

## 2019-01-20 NOTE — TOC Benefit Eligibility Note (Signed)
Transition of Care [facility]) Benefit Eligibility Note    Patient Details  Name: Katrina Benitez MRN: [id number] Date of Birth: [birth year]   Medication/Dose: Arne Cleveland  5 MG BID(APIXABAN: NON-FORMULARY)  Covered?: Yes  Tier: 3 Drug  Prescription Coverage [facility]erred [facility])  Spoke with Person/Company/Phone Number:: ANN([facility] [employer] [facility] # [phone] [employer]- [employer])  Co-Pay: $ 356.82  Prior Approval: No  Deductible: Unmet       Memory Argue Phone Number: 01/20/2019, 10:49 AM

## 2019-01-20 NOTE — Telephone Encounter (Signed)
See note

## 2019-01-20 NOTE — TOC Benefit Eligibility Note (Signed)
Transition of Care [facility]) Benefit Eligibility Note    Patient Details  Name: Katrina Benitez MRN: [id number] Date of Birth: 07/04/1946   Medication/Dose: Arne Cleveland  5 MG BID(APIXABAN: NON-FORMULARY)  Covered?: Yes  Tier: 3 Drug  Prescription Coverage [facility]erred [facility]: [facility])  Spoke with Person/Company/Phone Number:: ANN([facility] [employer] [facility] # [phone] OPT- [employer])  Co-Pay: % 356.82  Prior Approval: No  Deductible: Unmet       Memory Argue Phone Number: 01/20/2019, 10:49 AM

## 2019-01-20 NOTE — Progress Notes (Signed)
Pharmacy - Lovenox to Eliquis  On Lovenox for Afib - last dose this AM Scr stable, Weight > 60 kg and age < 73 years of age  Plan: Eliquis 5 mg po BID First month supplied from [facility] with Eliquis co-pay card   Thank you Anette Guarneri, PharmD

## 2019-01-21 ENCOUNTER — Inpatient Hospital Stay (HOSPITAL_COMMUNITY): Payer: Medicare Other

## 2019-01-21 DIAGNOSIS — I351 Nonrheumatic aortic (valve) insufficiency: Secondary | ICD-10-CM

## 2019-01-21 DIAGNOSIS — I340 Nonrheumatic mitral (valve) insufficiency: Secondary | ICD-10-CM

## 2019-01-21 LAB — MAGNESIUM: Magnesium: 1.9 mg/dL (ref 1.7–2.4)

## 2019-01-21 LAB — BASIC METABOLIC PANEL
Anion gap: 8 (ref 5–15)
BUN: 7 mg/dL — ABNORMAL LOW (ref 8–23)
CO2: 22 mmol/L (ref 22–32)
Calcium: 8.2 mg/dL — ABNORMAL LOW (ref 8.9–10.3)
Chloride: 102 mmol/L (ref 98–111)
Creatinine, Ser: 0.78 mg/dL (ref 0.44–1.00)
GFR calc Af Amer: >60 mL/min (ref >60)
GFR calc non Af Amer: >60 mL/min (ref >60)
Glucose, Bld: 103 mg/dL — ABNORMAL HIGH (ref 70–99)
Potassium: 3.9 mmol/L (ref 3.5–5.1)
Sodium: 132 mmol/L — ABNORMAL LOW (ref 135–145)

## 2019-01-21 LAB — ECHOCARDIOGRAM COMPLETE
Height: 66 in
Weight: 3202.84 oz

## 2019-01-21 LAB — NOVEL CORONAVIRUS, NAA (HOSP ORDER, SEND-OUT TO REF LAB; TAT 18-24 HRS): SARS-CoV-2, NAA: NOT DETECTED

## 2019-01-21 MED ORDER — AZITHROMYCIN 500 MG PO TABS: 500.0000 mg | ORAL_TABLET | Freq: Every day | ORAL | 0 refills | Status: AC

## 2019-01-21 MED ORDER — APIXABAN 5 MG PO TABS: 5.0000 mg | ORAL_TABLET | Freq: Two times a day (BID) | ORAL | 3 refills | Status: DC

## 2019-01-21 MED ORDER — GUAIFENESIN ER 600 MG PO TB12: 600.0000 mg | ORAL_TABLET | Freq: Two times a day (BID) | ORAL | 0 refills | Status: DC

## 2019-01-21 MED ORDER — THIAMINE HCL 100 MG PO TABS: 100.0000 mg | ORAL_TABLET | Freq: Every day | ORAL | 1 refills | Status: DC

## 2019-01-21 MED ORDER — ACETAMINOPHEN 325 MG PO TABS: 650.0000 mg | ORAL_TABLET | ORAL | 0 refills | Status: DC | PRN

## 2019-01-21 MED ORDER — ADULT MULTIVITAMIN W/MINERALS CH: 1.0000 | ORAL_TABLET | Freq: Every day | ORAL | 2 refills | Status: DC

## 2019-01-21 MED ORDER — CEFDINIR 300 MG PO CAPS: 300.0000 mg | ORAL_CAPSULE | Freq: Two times a day (BID) | ORAL | 0 refills | Status: AC

## 2019-01-21 MED ORDER — BENZONATATE 100 MG PO CAPS: 200.0000 mg | ORAL_CAPSULE | Freq: Once | ORAL | Status: DC

## 2019-01-21 MED ORDER — ALBUTEROL SULFATE HFA 108 (90 BASE) MCG/ACT IN AERS: 2.0000 | INHALATION_SPRAY | RESPIRATORY_TRACT | 0 refills | Status: DC | PRN

## 2019-01-21 MED ORDER — BENZONATATE 200 MG PO CAPS: 200.0000 mg | ORAL_CAPSULE | Freq: Three times a day (TID) | ORAL | 0 refills | Status: DC | PRN

## 2019-01-21 MED ORDER — FOLIC ACID 1 MG PO TABS: 1.0000 mg | ORAL_TABLET | Freq: Every day | ORAL | 3 refills | Status: DC

## 2019-01-21 MED ORDER — METOPROLOL TARTRATE 50 MG PO TABS: 50.0000 mg | ORAL_TABLET | Freq: Two times a day (BID) | ORAL | 2 refills | Status: DC

## 2019-01-21 MED FILL — THIAMINE HCL 100 MG TABS: 100 | 30 days supply | Qty: 30 | Fill #0 | Status: TO

## 2019-01-21 MED FILL — CEFDINIR 300 MG CAPSULE: 300 | 5 days supply | Qty: 10 | Fill #0

## 2019-01-21 MED FILL — METOPROLOL TARTRATE 50 MG T: 50 | 30 days supply | Qty: 60 | Fill #0

## 2019-01-21 MED FILL — BENZONATATE 200 MG CAPS: 200 | 4 days supply | Qty: 12 | Fill #0

## 2019-01-21 MED FILL — FOLIC ACID 1 MG TABS: 1 | 30 days supply | Qty: 30 | Fill #0 | Status: TO

## 2019-01-21 MED FILL — VENTOLIN HFA 90 MCG INHALER: 108 (90 BAS | 25 days supply | Qty: 18 | Fill #0

## 2019-01-21 NOTE — Plan of Care (Signed)
  Problem: Education: Goal: Knowledge of General Education information will improve Description Including pain rating scale, medication(s)/side effects and non-pharmacologic comfort measures Outcome: Progressing   Problem: Health Behavior/Discharge Planning: Goal: Ability to manage health-related needs will improve Outcome: Progressing   Problem: Clinical Measurements: Goal: Cardiovascular complication will be avoided Outcome: Progressing   Problem: Activity: Goal: Risk for activity intolerance will decrease Outcome: Progressing   Problem: Coping: Goal: Level of anxiety will decrease Outcome: Progressing   Problem: Pain Managment: Goal: General experience of comfort will improve Outcome: Progressing

## 2019-01-21 NOTE — Discharge Summary (Signed)
Katrina Benitez, is a 73 y.o. female  DOB 1946/09/13  MRN [id number].  Admission date:  01/19/2019  Admitting Physician  Lady Deutscher, MD  Discharge Date:  01/21/2019   Primary MD  Orma Flaming, MD  Recommendations for primary care physician for things to follow:   1) you are taking apixaban/Eliquis which is a blood thinner for stroke prophylaxis in the setting of atrial fibrillation so Avoid  ibuprofen/Advil/Aleve/Motrin/Goody Powders/Naproxen/BC powders/Meloxicam/Diclofenac/Indomethacin and other Nonsteroidal anti-inflammatory medications as these will make you more likely to bleed and can cause stomach ulcers, can also cause Kidney problems.   2) please take cough medicines, antibiotics and inhalers as prescribed for pneumonia and shortness of breath  3) follow-up to primary care physician for reevaluation and recheck within a week   Admission Diagnosis  Shortness of breath [R06.02] Atrial fibrillation with rapid ventricular response (HCC) [I48.91] Pneumonia of both lungs due to infectious organism, unspecified part of lung [J18.9] Covid-19 Virus not Detected [IMO0001]   Discharge Diagnosis  Shortness of breath [R06.02] Atrial fibrillation with rapid ventricular response (HCC) [I48.91] Pneumonia of both lungs due to infectious organism, unspecified part of lung [J18.9] Covid-19 Virus not Detected [IMO0001]    Principal Problem:   Atrial fibrillation with RVR ([place]) Active Problems:   SIADH (syndrome of inappropriate ADH production) (HCC)   Hypertension   Anxiety   GERD (gastroesophageal reflux disease)   Post herpetic neuralgia   Hyperlipidemia   Influenza-like illness   Atypical pneumonia   Edema of left lower extremity   Alcohol use      Past Medical History:  Diagnosis Date   Allergy    Anemia    Anxiety    Arthritis    djd   Cardiac arrhythmia    Depression     Diverticulitis    Diverticulosis    GERD (gastroesophageal reflux disease)    Heart murmur    HTN (hypertension)    Infertility, female    Neuromuscular disorder (HCC)    idiopathic peripheral  neuropathy   Peripheral neuropathy    Positive PPD    SIADH (syndrome of inappropriate ADH production) ([place])     Past Surgical History:  Procedure Laterality Date   BREAST BIOPSY Left    BREAST EXCISIONAL BIOPSY Left    benign   CERVICAL BIOPSY  W/ LOOP ELECTRODE EXCISION     CERVICAL POLYPECTOMY  02/2016   COLONOSCOPY     COLPOSCOPY     DILATION AND CURETTAGE OF UTERUS     HYSTEROSCOPY     LUMBAR DISC SURGERY     LUMBAR LAMINECTOMY     TONSILLECTOMY     UPPER GASTROINTESTINAL ENDOSCOPY         HPI  from the history and physical done on the day of admission:    Chief Complaint: Tachycardia, cough, fever  HPI: Gunhild Benitez is a 73 y.o. female with medical history significant of Anxiety, unspecified cardiac arrhythmia, diverticulosis, gastroesophageal reflux disease, aortic stenosis, SIADH, postherpetic neuralgia causing chest pain in  the past, today with cough fever and shortness of breath.  She was also found to be markedly tachycardic.  Evaluation in the ER revealed atrial fibrillation with rapid ventricular response.  Cough has been present for 1 week.  She developed chills during the night checked her temperature when she woke up.  She had a temperature of 102.  She took 650 mg of Tylenol twice at home.  She called EMS who gave her aspirin.  Her heart rate was also elevated.  He may have had a prior episode of atrial fibrillation but it resolved on route to the doctor's office.  Not on any blood thinners.  She denies any chest pain.  She has no known COVID exposures.  She is a retired Engineer, drilling.  In addition to the above symptoms she has a left lower extremity edema which she states is new.  He is a retired Stage manager and is concerned that she may have a  pulmonary embolism.  She also drinks 14 glasses of wine daily to which she admits but on conversation with her husband it may be twice that much.  Review of her medications are also concerning for Lorenz Coaster state leading her to perhaps imbibe more than usual.  She personally states that she has never been in alcohol withdrawal.  Her husband states that she has never withdrawn and he does not know if she is an alcoholic but that he is in recovery.  States that she starts drinking in the late afternoon and drinks at least 4 glasses of wine a day.  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  She and her husband retired here some years ago.  ED Course: EKG significant for atrial fibrillation with rapid ventricular response.  She was placed on a heparin drip due to her chads vas 2 score as well as a diltiazem drip.  Blood pressure dipped a little bit and diltiazem drip was discontinued.  Patient was initially checked for COVID-19 in the emergency department and rapid testing was negative but given syndrome presentation repeat testing was sent to outside lab.  X-ray shows a bilateral pneumonia although not in the typical interstitial infiltrate pattern consistent with COVID-19.  Ceftin was given in the emergency department along with azithromycin.  CT scan was read by radiology as typical infection.  She was referred to me for further evaluation.   Hospital Course:   Brief Summary:- 73 y.o.femalewith a PMH of HTN, anxiety disorder, GERD, mild aortic insufficiency, ideopathic peripheral neuropathy, herpes zoster c/b postherpetic neuralgia, and SIADH,who is being seen today for the evaluation of atrial fibrillation with RVR   A/p 1)New Onset AFib/PAFib----  back in sinus rhythm, CHA2DS2-VASc Score  3 (HTN, age, Female)  equal to3.2 % stroke risk rate/year... Switch from Lovenox to apixaban for stroke prophylaxis, increase metoprolol to 50 mg twice daily for rate control, repeat echo with  preserved EF of 60 to 65%, without significant abnormalities, compared to echo from August 2018, no significant new findings cardiology consult appreciated.    Initially required IV Cardizem drip for rate control  2)Bilateral pneumonia----rapid COVID-19 test negative, send out COVID-19- also negative, treated with IV azithromycin and Rocephin, okay to discharge on Omnicef and azithromycin  3) alcohol use----no frank DTs, patient was placed on  Lorazepam per CIWA protocol, continue multivitamin and folic acid  4) anxiety disorder--- stable, continue Vistaril and benzos  5) SIADH--- stable at this time, avoid excessive free water  6) acute hypoxic respiratory failure--- secondary to  bilateral pneumonia as above #2 in the setting of A. fib with RVR, hypoxia resolved    Code Status : Full code  Family Communication:   husband  Disposition Plan  : home  Consults  : Cardiology  DVT Prophylaxis  : Switch from Lovenox to apixaban  Discharge Condition: Stable  Follow UP  Follow-up Information    Orma Flaming, MD.   Specialty:  Family Medicine Why:  Virtual Visit scheduled for Monday, Jan 24, 2019 at 10:00 a.m.  Check email for more detailed instructions. Contact information: [place] [address] [phone]           Diet and Activity recommendation:  As advised  Discharge Instructions    Discharge Instructions    Call MD for:  difficulty breathing, headache or visual disturbances   Complete by:  As directed    Call MD for:  persistant dizziness or light-headedness   Complete by:  As directed    Call MD for:  persistant nausea and vomiting   Complete by:  As directed    Call MD for:  severe uncontrolled pain   Complete by:  As directed    Call MD for:  temperature >100.4   Complete by:  As directed    Diet - low sodium heart healthy   Complete by:  As directed    Discharge instructions   Complete by:  As directed    1) you are taking  apixaban/Eliquis which is a blood thinner for stroke prophylaxis in the setting of atrial fibrillation so Avoid  ibuprofen/Advil/Aleve/Motrin/Goody Powders/Naproxen/BC powders/Meloxicam/Diclofenac/Indomethacin and other Nonsteroidal anti-inflammatory medications as these will make you more likely to bleed and can cause stomach ulcers, can also cause Kidney problems.   2) please take cough medicines, antibiotics and inhalers as prescribed for pneumonia and shortness of breath  3) follow-up to primary care physician for reevaluation and recheck within a week   Increase activity slowly   Complete by:  As directed         Discharge Medications     Allergies as of 01/21/2019      Reactions   Fentanyl Itching   Ciprofloxacin Hives   Dilaudid [hydromorphone Hcl] Hives   Flagyl [metronidazole] Hives   Visipaque [iodixanol] Swelling   Throat swelling      Medication List    STOP taking these medications   ibuprofen 200 MG tablet Commonly known as:  ADVIL     TAKE these medications   acetaminophen 325 MG tablet Commonly known as:  TYLENOL Take 2 tablets (650 mg total) by mouth every 4 (four) hours as needed for mild pain, fever or headache. Notes to patient:  Today at 1251   albuterol 108 (90 Base) MCG/ACT inhaler Commonly known as:  VENTOLIN HFA Inhale 2 puffs into the lungs every 4 (four) hours as needed for wheezing or shortness of breath (cough). Notes to patient:  Today at 0924   ALIGN PO Take by mouth daily. Notes to patient:  Today at 1122   apixaban 5 MG Tabs tablet Commonly known as:  Eliquis Take 1 tablet (5 mg total) by mouth 2 (two) times daily. Notes to patient:  Today at 1120   azithromycin 500 MG tablet Commonly known as:  ZITHROMAX Take 1 tablet (500 mg total) by mouth daily for 5 days. Notes to patient:  Today at 1418   benzonatate 200 MG capsule Commonly known as:  TESSALON Take 1 capsule (200 mg total) by mouth 3 (three)  times daily as needed for  cough.   cefdinir 300 MG capsule Commonly known as:  OMNICEF Take 1 capsule (300 mg total) by mouth 2 (two) times daily for 5 days.   diazepam 10 MG tablet Commonly known as:  VALIUM TAKE ONE TABLET DAILY AS NEEDED What changed:  See the new instructions.   folic acid 1 MG tablet Commonly known as:  FOLVITE Take 1 tablet (1 mg total) by mouth daily. Start taking on:  Jan 22, 2019 Notes to patient:  Today at 1121   gabapentin 600 MG tablet Commonly known as:  NEURONTIN Take 1 tablet (600 mg total) by mouth 3 (three) times daily. Notes to patient:  Today at 0918   guaiFENesin 600 MG 12 hr tablet Commonly known as:  MUCINEX Take 1 tablet (600 mg total) by mouth 2 (two) times daily. Notes to patient:  Today at 1121   hydrOXYzine 25 MG tablet Commonly known as:  ATARAX/VISTARIL Take 1 tablet (25 mg total) by mouth 3 (three) times daily as needed.   metoprolol tartrate 50 MG tablet Commonly known as:  LOPRESSOR Take 1 tablet (50 mg total) by mouth 2 (two) times daily. Notes to patient:  Today at 1120   multivitamin with minerals Tabs tablet Take 1 tablet by mouth daily. Start taking on:  Jan 22, 2019 Notes to patient:  Today at 1121   ondansetron 8 MG tablet Commonly known as:  ZOFRAN Take 8 mg by mouth daily as needed for nausea.   thiamine 100 MG tablet Take 1 tablet (100 mg total) by mouth daily. Start taking on:  Jan 22, 2019 Notes to patient:  Today at 1120   zolpidem 10 MG tablet Commonly known as:  AMBIEN Take 1 tablet (10 mg total) by mouth at bedtime as needed. Notes to patient:  Yesterday at 2125       Major procedures and Radiology Reports - PLEASE review detailed and final reports for all details, in brief -   Ct Chest Wo Contrast  Result Date: 01/19/2019 CLINICAL DATA:  Cough and shortness of breath. EXAM: CT CHEST WITHOUT CONTRAST TECHNIQUE: Multidetector CT imaging of the chest was performed following the standard protocol without IV contrast.  COMPARISON:  One-view chest x-ray 01/19/2019. CT of the chest 08/01/2016. FINDINGS: Cardiovascular: The heart size is normal. Minimal atherosclerotic calcifications are present at the aortic arch. There is mild prominence the pulmonary arteries bilaterally. Mediastinum/Nodes: Subcentimeter mediastinal nodes mediastinal are present. No significant hilar or axillary adenopathy is present. Lungs/Pleura: Patchy peripheral airspace disease is present bilaterally. There is no lower lobe predominance. No significant consolidation is present. There is no cord vasculature. Bronchi are patent. No significant pleural effusion is present. Upper Abdomen: Within normal limits. Musculoskeletal: Vertebral body heights alignment are maintained. No focal lytic or blastic lesions are present. IMPRESSION: 1. Patchy peripheral airspace disease involving the upper and lower lobes bilaterally. The findings are nonspecific. This raises concern for atypical infection. 2. Subcentimeter lymph nodes are likely reactive. Electronically Signed   By: San Morelle M.D.   On: 01/19/2019 12:04   Portable Chest 1 View  Result Date: 01/20/2019 CLINICAL DATA:  Short of breath EXAM: PORTABLE CHEST 1 VIEW COMPARISON:  01/19/2019 FINDINGS: Normal heart size. Mild hazy airspace disease at the lateral left base. No pneumothorax. No pleural effusion. IMPRESSION: Mild hazy airspace disease at the lateral left base. Electronically Signed   By: Marybelle Killings M.D.   On: 01/20/2019 07:57   Dg Chest Port 1  View  Result Date: 01/19/2019 CLINICAL DATA:  Cough, shortness of breath. EXAM: PORTABLE CHEST 1 VIEW COMPARISON:  Radiographs of Jan 22, 2016. CT scan of August 01, 2016. FINDINGS: Stable cardiomediastinal silhouette. No pneumothorax or pleural effusion is noted. Both lungs are clear. The visualized skeletal structures are unremarkable. IMPRESSION: No active disease. Electronically Signed   By: Marijo Conception M.D.   On: 01/19/2019 09:51   Vas  [place] Lower Extremity Venous (dvt) (only Mc & Wl 7a-7p)  Result Date: 01/19/2019  Lower Venous Study Indications: Edema.  Performing Technologist: Abram Sander RVS  Examination Guidelines: A complete evaluation includes B-mode imaging, spectral Doppler, color Doppler, and power Doppler as needed of all accessible portions of each vessel. Bilateral testing is considered an integral part of a complete examination. Limited examinations for reoccurring indications may be performed as noted.  +---------+---------------+---------+-----------+----------+-------+  RIGHT     Compressibility Phasicity Spontaneity Properties Summary  +---------+---------------+---------+-----------+----------+-------+  CFV       Full            Yes       Yes                             +---------+---------------+---------+-----------+----------+-------+  SFJ       Full                                                      +---------+---------------+---------+-----------+----------+-------+  FV Prox   Full                                                      +---------+---------------+---------+-----------+----------+-------+  FV Mid    Full                                                      +---------+---------------+---------+-----------+----------+-------+  FV Distal Full                                                      +---------+---------------+---------+-----------+----------+-------+  PFV       Full                                                      +---------+---------------+---------+-----------+----------+-------+  POP       Full            Yes       Yes                             +---------+---------------+---------+-----------+----------+-------+  PTV       Full                                                      +---------+---------------+---------+-----------+----------+-------+  PERO      Full                                                      +---------+---------------+---------+-----------+----------+-------+    +---------+---------------+---------+-----------+----------+-------+  LEFT      Compressibility Phasicity Spontaneity Properties Summary  +---------+---------------+---------+-----------+----------+-------+  CFV       Full            Yes       Yes                             +---------+---------------+---------+-----------+----------+-------+  SFJ       Full                                                      +---------+---------------+---------+-----------+----------+-------+  FV Prox   Full                                                      +---------+---------------+---------+-----------+----------+-------+  FV Mid    Full                                                      +---------+---------------+---------+-----------+----------+-------+  FV Distal Full                                                      +---------+---------------+---------+-----------+----------+-------+  PFV       Full                                                      +---------+---------------+---------+-----------+----------+-------+  POP       Full            Yes       Yes                             +---------+---------------+---------+-----------+----------+-------+  PTV       Full                                                      +---------+---------------+---------+-----------+----------+-------+  PERO      Full                                                      +---------+---------------+---------+-----------+----------+-------+  Summary: Right: There is no evidence of deep vein thrombosis in the lower extremity. No cystic structure found in the popliteal fossa. Left: There is no evidence of deep vein thrombosis in the lower extremity. No cystic structure found in the popliteal fossa.  *See table(s) above for measurements and observations. Electronically signed by Curt Jews MD on 01/19/2019 at 5:16:06 PM.    Final     Micro Results    Recent Results (from the past 240 hour(s))  Culture, blood (routine x 2)      Status: None (Preliminary result)   Collection Time: 01/19/19  9:33 AM  Result Value Ref Range Status   Specimen Description BLOOD RIGHT HAND  Final   Special Requests   Final    BOTTLES DRAWN AEROBIC AND ANAEROBIC Blood Culture adequate volume   Culture   Final    NO GROWTH 2 DAYS Performed at [facility], [address], [place], [place] [address]    Report Status PENDING  Incomplete  SARS Coronavirus 2 [facility] order, Performed in [place] hospital lab)     Status: None   Collection Time: 01/19/19  9:37 AM  Result Value Ref Range Status   SARS Coronavirus 2 NEGATIVE NEGATIVE Final    Comment: (NOTE) If result is NEGATIVE SARS-CoV-2 target nucleic acids are NOT DETECTED. The SARS-CoV-2 RNA is generally detectable in upper and lower  respiratory specimens during the acute phase of infection. The lowest  concentration of SARS-CoV-2 viral copies this assay can detect is 250  copies / mL. A negative result does not preclude SARS-CoV-2 infection  and should not be used as the sole basis for treatment or other  patient management decisions.  A negative result may occur with  improper specimen collection / handling, submission of specimen other  than nasopharyngeal swab, presence of viral mutation(s) within the  areas targeted by this assay, and inadequate number of viral copies  (<250 copies / mL). A negative result must be combined with clinical  observations, patient history, and epidemiological information. If result is POSITIVE SARS-CoV-2 target nucleic acids are DETECTED. The SARS-CoV-2 RNA is generally detectable in upper and lower  respiratory specimens dur ing the acute phase of infection.  Positive  results are indicative of active infection with SARS-CoV-2.  Clinical  correlation with patient history and other diagnostic information is  necessary to determine patient infection status.  Positive results do  not rule out bacterial infection or co-infection with  other viruses. If result is PRESUMPTIVE POSTIVE SARS-CoV-2 nucleic acids MAY BE PRESENT.   A presumptive positive result was obtained on the submitted specimen  and confirmed on repeat testing.  While 2019 novel coronavirus  (SARS-CoV-2) nucleic acids may be present in the submitted sample  additional confirmatory testing may be necessary for epidemiological  and / or clinical management purposes  to differentiate between  SARS-CoV-2 and other Sarbecovirus currently known to infect humans.  If clinically indicated additional testing with an alternate test  methodology [phone]) is advised. The SARS-CoV-2 RNA is generally  detectable in upper and lower respiratory sp ecimens during the acute  phase of infection. The expected result is Negative. Fact Sheet for Patients:  StrictlyIdeas.no Fact Sheet for Healthcare Providers: BankingDealers.co.za This test is not yet approved or cleared by the [place] FDA and has been authorized for detection and/or diagnosis of SARS-CoV-2 by FDA under an Emergency Use Authorization (EUA).  This EUA will remain in effect (meaning this test can be used) for  the duration of the COVID-19 declaration under Section 564(b)(1) of the Act, 21 U.S.C. section 360bbb-3(b)(1), unless the authorization is terminated or revoked sooner. Performed at [facility], [place] [address], [place], [place] [address]   Respiratory Panel by PCR     Status: None   Collection Time: 01/19/19  9:37 AM  Result Value Ref Range Status   Adenovirus NOT DETECTED NOT DETECTED Final   Coronavirus 229E NOT DETECTED NOT DETECTED Final    Comment: (NOTE) The Coronavirus on the Respiratory Panel, DOES NOT test for the novel  Coronavirus (2019 nCoV)    Coronavirus HKU1 NOT DETECTED NOT DETECTED Final   Coronavirus NL63 NOT DETECTED NOT DETECTED Final   Coronavirus OC43 NOT DETECTED NOT DETECTED Final   Metapneumovirus NOT DETECTED  NOT DETECTED Final   Rhinovirus / Enterovirus NOT DETECTED NOT DETECTED Final   Influenza A NOT DETECTED NOT DETECTED Final   Influenza B NOT DETECTED NOT DETECTED Final   Parainfluenza Virus 1 NOT DETECTED NOT DETECTED Final   Parainfluenza Virus 2 NOT DETECTED NOT DETECTED Final   Parainfluenza Virus 3 NOT DETECTED NOT DETECTED Final   Parainfluenza Virus 4 NOT DETECTED NOT DETECTED Final   Respiratory Syncytial Virus NOT DETECTED NOT DETECTED Final   Bordetella pertussis NOT DETECTED NOT DETECTED Final   Chlamydophila pneumoniae NOT DETECTED NOT DETECTED Final   Mycoplasma pneumoniae NOT DETECTED NOT DETECTED Final    Comment: Performed at [facility], [place]. [address], [place], [place] [address]  Culture, blood (routine x 2)     Status: None (Preliminary result)   Collection Time: 01/19/19  9:50 AM  Result Value Ref Range Status   Specimen Description BLOOD RIGHT ANTECUBITAL  Final   Special Requests   Final    BOTTLES DRAWN AEROBIC AND ANAEROBIC Blood Culture adequate volume   Culture   Final    NO GROWTH 2 DAYS Performed at [facility], [place] [address], [place], [place] [address]    Report Status PENDING  Incomplete  MRSA PCR Screening     Status: None   Collection Time: 01/19/19  3:45 PM  Result Value Ref Range Status   MRSA by PCR NEGATIVE NEGATIVE Final    Comment:        The GeneXpert MRSA Assay (FDA approved for NASAL specimens only), is one component of a comprehensive MRSA colonization surveillance program. It is not intended to diagnose MRSA infection nor to guide or monitor treatment for MRSA infections. Performed at [facility], [place] [address], [place], [place] [address]   Urine culture     Status: None   Collection Time: 01/19/19  6:26 PM  Result Value Ref Range Status   Specimen Description URINE, CLEAN CATCH  Final   Special Requests NONE  Final   Culture   Final    NO GROWTH Performed at [facility], [place] [address],  [place], [place] [address]    Report Status 01/20/2019 FINAL  Final  Novel Coronavirus, NAA (hospital order; send-out to ref lab)     Status: None   Collection Time: 01/19/19  9:36 PM  Result Value Ref Range Status   SARS-CoV-2, NAA NOT DETECTED NOT DETECTED Final    Comment: (NOTE) This test was developed and its performance characteristics determined by [employer]. This test has not been FDA cleared or approved. This test has been authorized by FDA under an Emergency Use Authorization (EUA). This test has been validated  in accordance with the FDA's Guidance Document Adult nurse for Diagnostics Testing in Laboratories Certified to Perform High Complexity Testing under CLIA prior to Emergency Use Authorization for Coronavirus [id number] during the [facility] Emergency) issued on February 29th, 2020. FDA independent review of this validation is pending. This test is only authorized for the duration of time the declaration that circumstances exist justifying the authorization of the emergency use of in vitro diagnostic tests for detection of SARS-CoV- 2 virus and/or diagnosis of COVID-19 infection under section 564(b)(1) of the Act, 21 U.S.C. 585IDP-8(E)(4), unless the authorization is terminated or revoked sooner. Performed At: [facility] [address] [place], [place] [id number] Rush Farmer MD XV:[id number]    [place]  Final    Comment: Performed at [facility], [place] [address], [place], [place] [address]       Today   Subjective    Sheniya Garciaperez today has no new concerns... Dry cough is improving, no chest pain, no palpitations, no shortness of breath, no fevers, no chills, no dizziness, no pleuritic symptoms, no leg pains no leg swelling  Eating and drinking okay, ambulating around in the room.... Not Requiring oxygen          Patient has been seen and examined prior to discharge   Objective   Blood pressure (!) 156/71,  pulse 72, temperature 98.5 F (36.9 C), temperature source Oral, resp. rate (!) 21, height 5\' 6"  (1.676 m), weight 90.8 kg, SpO2 97 %.  No intake or output data in the 24 hours ending 01/21/19 1553  Exam Gen:- Awake Alert,  In no apparent distress  HEENT:- [place].AT, No sclera icterus Neck-Supple Neck,No JVD,.  Lungs-  CTAB , fair symmetrical air movement CV- S1, S2 normal, regular (was previously in A. fib with RVR) Abd-  +ve B.Sounds, Abd Soft, No tenderness,    Extremity/Skin:- No  edema, pedal pulses present  Psych-affect is appropriate, oriented x3 Neuro-no new focal deficits, no tremors   Data Review   CBC w Diff:  Lab Results  Component Value Date   WBC 5.4 01/20/2019   HGB 11.6 (L) 01/20/2019   HGB 11.3 01/19/2019   HCT 35.0 (L) 01/20/2019   PLT 219 01/20/2019   LYMPHOPCT 22.6 08/12/2018   MONOPCT 14.0 (H) 08/12/2018   EOSPCT 2.9 08/12/2018   BASOPCT 1.3 08/12/2018    CMP:  Lab Results  Component Value Date   NA 132 (L) 01/21/2019   NA 132 (L) 07/13/2018   K 3.9 01/21/2019   CL 102 01/21/2019   CO2 22 01/21/2019   BUN 7 (L) 01/21/2019   BUN 16 07/13/2018   CREATININE 0.78 01/21/2019   GLU 96 06/01/2018   PROT 6.9 07/13/2018   ALBUMIN 4.7 07/13/2018   BILITOT 0.3 07/13/2018   ALKPHOS 108 07/13/2018   AST 21 07/13/2018   ALT 16 07/13/2018  .   Total Discharge time is about 33 minutes  Roxan Hockey M.D on 01/21/2019 at 3:53 PM  Go to [email or web address] -  for contact info  [facility] - Office  [phone]

## 2019-01-21 NOTE — Progress Notes (Signed)
*  PRELIMINARY RESULTS* Echocardiogram 2D Echocardiogram has been performed.  Katrina Benitez 01/21/2019, 8:55 AM

## 2019-01-21 NOTE — TOC Transition Note (Signed)
Transition of Care [facility]) - CM/SW Discharge Note   Patient Details  Name: Katrina Benitez MRN: [id number] Date of Birth: 07/16/1946  Transition of Care [facility]) CM/SW Contact:  Zenon Mayo, RN Phone Number: 01/21/2019, 3:31 PM   Clinical Narrative:    Patient for discharge today, NCM spoke with patient regarding the eliquis, co pay of 346.00, she received the 30 day free already filled by the [facility] pharmacy.  She states she will be able to afford the co pay of 346.00 , then when deductible is met the copay should decrease.  NCM informed her that it is very important for her to take this medication, she states don''t worry she will be ok and she will get her medication.    Final next level of care: Home/Self Care Barriers to Discharge: No Barriers Identified   Patient Goals and CMS Choice Patient states their goals for this hospitalization and ongoing recovery are:: to get her house in order, read books   Choice offered to / list presented to : NA  Discharge Placement                       Discharge Plan and Services In-house Referral: NA Discharge Planning Services: CM Consult Post Acute Care Choice: NA          DME Arranged: N/A DME Agency: NA                  Social Determinants of Health (SDOH) Interventions     Readmission Risk Interventions Readmission Risk Prevention Plan 01/21/2019  Post Dischage Appt Complete  Medication Screening (No Data)  Transportation Screening Complete  Some recent data might be hidden

## 2019-01-24 ENCOUNTER — Telehealth: Payer: Self-pay | Admitting: Cardiovascular Disease

## 2019-01-24 ENCOUNTER — Encounter: Payer: Self-pay | Admitting: Family Medicine

## 2019-01-24 ENCOUNTER — Telehealth: Payer: Self-pay

## 2019-01-24 ENCOUNTER — Ambulatory Visit (INDEPENDENT_AMBULATORY_CARE_PROVIDER_SITE_OTHER): Payer: Medicare Other | Admitting: Family Medicine

## 2019-01-24 VITALS — BP 122/82 | HR 61 | Temp 98.1°F | Ht 66.0 in | Wt 200.0 lb

## 2019-01-24 DIAGNOSIS — Z7289 Other problems related to lifestyle: Secondary | ICD-10-CM

## 2019-01-24 DIAGNOSIS — E222 Syndrome of inappropriate secretion of antidiuretic hormone: Secondary | ICD-10-CM | POA: Diagnosis not present

## 2019-01-24 DIAGNOSIS — J189 Pneumonia, unspecified organism: Secondary | ICD-10-CM | POA: Diagnosis not present

## 2019-01-24 DIAGNOSIS — Z789 Other specified health status: Secondary | ICD-10-CM

## 2019-01-24 DIAGNOSIS — I4891 Unspecified atrial fibrillation: Secondary | ICD-10-CM | POA: Diagnosis not present

## 2019-01-24 LAB — CULTURE, BLOOD (ROUTINE X 2)
Culture: NO GROWTH
Culture: NO GROWTH
Special Requests: ADEQUATE
Special Requests: ADEQUATE

## 2019-01-24 MED ORDER — GUAIFENESIN-CODEINE 100-10 MG/5ML PO SOLN: 10.0000 mL | Freq: Four times a day (QID) | ORAL | 0 refills | Status: DC | PRN

## 2019-01-24 MED ORDER — AZITHROMYCIN 500 MG PO TABS: 500.0000 mg | ORAL_TABLET | Freq: Every day | ORAL | 0 refills | Status: DC

## 2019-01-24 NOTE — Telephone Encounter (Signed)
Left detailed voicemail on patient's cell phone requesting a call back.  Need to ask some pre screening questions prior to doxy visit for hospitalization follow up

## 2019-01-24 NOTE — Telephone Encounter (Signed)
New Message    Pt is calling and says she was in the hospital last week Wednesday, Thursday and Friday and Dr Burt Knack told her he would see her and did not. She called to schedule her hospital f/u and I advised her there was nothing available and asked if she would like an appt with a PA The pt said no and only wants the appt with Dr Burt Knack   Please call back

## 2019-01-24 NOTE — Telephone Encounter (Signed)
Per Dr. Burt Knack, scheduled the patient for an e-visit 6/3 at 1300. Consent obtained and documented below.      Virtual Visit Pre-Appointment Phone Call  1. Confirm consent - "In the setting of the current Covid19 crisis, you are scheduled for a (phone or video) visit with your provider on (date) at (time).  Just as we do with many in-office visits, in order for you to participate in this visit, we must obtain consent.  If you'd like, I can send this to your mychart (if signed up) or email for you to review.  Otherwise, I can obtain your verbal consent now.  All virtual visits are billed to your insurance company just like a normal visit would be.  By agreeing to a virtual visit, we'd like you to understand that the technology does not allow for your provider to perform an examination, and thus may limit your provider's ability to fully assess your condition. If your provider identifies any concerns that need to be evaluated in person, we will make arrangements to do so.  Finally, though the technology is pretty good, we cannot assure that it will always work on either your or our end, and in the setting of a video visit, we may have to convert it to a phone-only visit.  In either situation, we cannot ensure that we have a secure connection.  Are you willing to proceed?" STAFF: Did the patient verbally acknowledge consent to telehealth visit? Document YES/NO here: YES  TELEPHONE CALL NOTE  Kalinda Romaniello has been deemed a candidate for a follow-up tele-health visit to limit community exposure during the Covid-19 pandemic. I spoke with the patient via phone to ensure availability of phone/video source, confirm preferred email & phone number, and discuss instructions and expectations.  I reminded [name] to be prepared with any vital sign and/or heart rhythm information that could potentially be obtained via home monitoring, at the time of her visit. I reminded [name] to expect a phone call  prior to her visit.  Theodoro Parma, RN 01/24/2019 5:35 PM   IF USING DOXIMITY or [email or web address].ME - The patient will receive a link just prior to their visit by text.

## 2019-01-24 NOTE — Progress Notes (Signed)
codeinePatient: Katrina Benitez MRN: [id number] DOB: 1946/07/12 PCP: Orma Flaming, MD     I connected with [name] on 01/24/19 at 11:00am by a video enabled telemedicine application and verified that I am speaking with the correct person using two identifiers.  Location patient: Home Location provider: [facility], Office Persons participating in this virtual visit: Clancy Gourd, Mr. Donalson (husband) and Dr. Rogers Blocker  I discussed the limitations of evaluation and management by telemedicine and the availability of in person appointments. The patient expressed understanding and agreed to proceed.   Subjective:  Chief Complaint  Patient presents with  . Hospitalization Follow-up    HPI: The patient is a 73 y.o. female who presents today for hospital follow up.  ADMIT date : 01/19/2019 Discharge date: 01/21/2019.   Dr. Clydene Laming experienced shortness of breath, fever and cough and was taken to ER where she was found to be in Afib with RVR as well as had bilateral pneumonia. She had a negative CT and doppler for PE/DVT. She had 2 covid 19 tests that were negative. Hospital course per below.   1) afib with RVR: given cardizem drip and heparin drip in ER which controlled rate and she converted. Placed on PO metoprolol. Also discharged on apixaban due to [facility] score of 3.2%. Cardiology consulted. Repeat echo showed preserved EF to 60-65%. She is taking metoprolol and eliquis as prescribed. Continues to be in sinus rhythm as far as she can tell.   2) bilateral pneumonia: rapid and send out covid 19 both negative. Treated with IV azithromycin, rocephin. Discharged on oral omnicef and azithromycin. Blood cultures x 2 negative-final today. She somehow lost her azithromycin px and only took one dose on Saturday. She is also requesting a cough syrup. Given inhaler and tessalon pearls at discharge. Feeling much better. Denies any SOB, fever, chills. Still has a cough.   3. Alcohol abuse: no frank DTs.  Placed on lorazepam per CIWA protocol. MV and folic acid and thiamine. Possibly drinking up to 14 glasses of wine/day. Never had withdrawal and husband unsure of how much she actually drinks. He is a recovered alcoholic. She is very upset about this. She states she never has drank that much in her life and stated she has 14/glasses a WEEK. She wants this out of her chart.   4. Acute hypoxic respiratory failure: secondary to afib with RVR and bilateral pneumonia. Resolved.    Review of Systems  Constitutional: Positive for fatigue. Negative for chills and fever.  HENT: Negative for congestion, ear pain, sinus pressure and sinus pain.   Respiratory: Positive for cough and shortness of breath. Negative for chest tightness and wheezing.   Cardiovascular: Negative for chest pain, palpitations and leg swelling.  Gastrointestinal: Negative for abdominal pain and nausea.  Musculoskeletal: Positive for back pain.  Neurological: Positive for headaches. Negative for dizziness.    Allergies Patient is allergic to fentanyl; ciprofloxacin; dilaudid [hydromorphone hcl]; flagyl [metronidazole]; and visipaque [iodixanol].  Past Medical History Patient  has a past medical history of Allergy, Anemia, Anxiety, Arthritis, Cardiac arrhythmia, Depression, Diverticulitis, Diverticulosis, GERD (gastroesophageal reflux disease), Heart murmur, HTN (hypertension), Infertility, female, Neuromuscular disorder ([place]), Peripheral neuropathy, Positive PPD, and SIADH (syndrome of inappropriate ADH production) ([place]).  Surgical History Patient  has a past surgical history that includes Lumbar disc surgery; Lumbar laminectomy; Tonsillectomy; Colonoscopy; Upper gastrointestinal endoscopy; Cervical polypectomy (02/2016); Breast biopsy (Left); Breast excisional biopsy (Left); Colposcopy; Dilation and curettage of uterus; Hysteroscopy; and Cervical biopsy w/ loop electrode excision.  Family History Pateint's family history includes  Breast cancer in her mother; Depression in her brother and father; Early death in her father; Glaucoma in her mother; Heart attack in her father; Heart disease in her father; Hypertension in her brother; Miscarriages / [place] in her mother; Prostate cancer in her paternal grandfather; Tuberculosis in her paternal grandmother.  Social History Patient  reports that she has never smoked. She has never used smokeless tobacco. She reports current alcohol use of about 14.0 standard drinks of alcohol per week. She reports that she does not use drugs.    Objective: Vitals:   01/24/19 1047  BP: 122/82  Pulse: 61  Temp: 98.1 F (36.7 C)  TempSrc: Oral  Weight: 200 lb (90.7 kg)  Height: 5\' 6"  (1.676 m)    Body mass index is 32.28 kg/m.  Physical Exam Vitals signs reviewed.  HENT:     Head: Normocephalic.  Pulmonary:     Effort: Pulmonary effort is normal.  Neurological:     General: No focal deficit present.     Mental Status: She is alert and oriented to person, place, and time.  Psychiatric:        Mood and Affect: Mood normal.        Behavior: Behavior normal.        Assessment/plan: 1. Atrial fibrillation with RVR (HCC) NSR at discharge and per patient continues to be in this. Continue metoprolol and eliquis. Echo wnl. F/u with Dr. Burt Knack for length of eliquis need. precautions given. Also again stressed no NSAIDS while on this medication which she understands.   2. Atypical pneumonia Continue abx. Resent in her azithromycin. Continue inhaler, tessalon pearls and will do codeine cough syrup prn. She has had in the past with no issues since has allergy to dialuadid. Discussed not to take valium or use alcohol with this. Precautions given, but improving clinically. Also advised she use spirometer or deep breathing to open up her lungs.   3. SIADH (syndrome of inappropriate ADH production) (HCC) All at basline. She has standing orders for monthly bmps. Will add on vitamin D as  needed at next month draw. Refrain from excessive free water.   4. Alcohol use Patient is deeply concerned that this in her chart. She states she drinks 14/glasses a week, not a night and it would kill her if she drank this much in one seating. She wants this out of her chart. Her husband is seated with her and also states she doesn't drink that much a night, that it's per week. She has not been drinking since out of hospital nor did she have anything since in hospital. She doesn't think she needs the thiamine/folic acid if only having 1-2 glasses/wine a night. I do agree, but discussed if is consistently drinking more than 2 glasses/night I would continue to take this. If concerned about record recommended she contact records/hospital department.     Return if symptoms worsen or fail to improve.    Orma Flaming, MD [place]  01/24/2019

## 2019-01-24 NOTE — Telephone Encounter (Signed)
-----   Message from Sherren Mocha, MD sent at 01/21/2019  3:57 PM EDT ----- Cephus Shelling - I didn't see this patient today, but she was admitted with atrial fib in the setting of viral illness. Can you set her up for telehealth follow-up in 4 weeks? thx

## 2019-02-15 ENCOUNTER — Other Ambulatory Visit: Payer: Medicare Other

## 2019-02-17 ENCOUNTER — Telehealth: Payer: Self-pay | Admitting: Family Medicine

## 2019-02-17 NOTE — Telephone Encounter (Signed)
Please advise if ok to schedule a Virtual Visit. Copied from [place] [id number]. Topic: General - Other >> Feb 17, 2019 10:48 AM [name] wrote: Reason for CRM: Patient called to ask for a call back from Dr Rogers Blocker or her nurse regarding her medication. Michela Pitcher that she had a virtual visit befofe but need another one Ph# [phone]

## 2019-02-17 NOTE — Telephone Encounter (Signed)
Spoke with patient.  She states that her husband feels that she needs some meds for depression.  She reports that she is feeling really "blue" and wants some advice from Dr. Rogers Blocker.  Doxy appt scheduled for tomorrow morning 5/29.

## 2019-02-18 ENCOUNTER — Ambulatory Visit (INDEPENDENT_AMBULATORY_CARE_PROVIDER_SITE_OTHER): Payer: Medicare Other | Admitting: Family Medicine

## 2019-02-18 ENCOUNTER — Encounter: Payer: Self-pay | Admitting: Family Medicine

## 2019-02-18 ENCOUNTER — Telehealth: Payer: Self-pay | Admitting: Cardiovascular Disease

## 2019-02-18 VITALS — BP 120/70 | HR 60 | Ht 66.0 in | Wt 200.0 lb

## 2019-02-18 DIAGNOSIS — F5101 Primary insomnia: Secondary | ICD-10-CM | POA: Diagnosis not present

## 2019-02-18 DIAGNOSIS — F339 Major depressive disorder, recurrent, unspecified: Secondary | ICD-10-CM | POA: Diagnosis not present

## 2019-02-18 MED ORDER — APIXABAN 5 MG PO TABS: 5.0000 mg | ORAL_TABLET | Freq: Two times a day (BID) | ORAL | 3 refills | Status: DC

## 2019-02-18 MED ORDER — BUPROPION HCL ER (XL) 150 MG PO TB24: 150.0000 mg | ORAL_TABLET | Freq: Every day | ORAL | 0 refills | Status: DC

## 2019-02-18 MED ORDER — METOPROLOL TARTRATE 50 MG PO TABS: 50.0000 mg | ORAL_TABLET | Freq: Two times a day (BID) | ORAL | 1 refills | Status: DC

## 2019-02-18 NOTE — Telephone Encounter (Signed)
New Message    Pt is calling and is wanting to be seen in the office  She does not want a virtual visit    Please call

## 2019-02-18 NOTE — Progress Notes (Signed)
120/70Patient: Katrina Benitez MRN: [id number] DOB: 10/29/1945 PCP: Katrina Flaming, MD     I connected with Katrina Benitez on 02/18/19 at 9:39am by a video enabled telemedicine application and verified that I am speaking with the correct person using two identifiers.  Location patient: Home  Location provider: [place] Benitez, Office Persons participating in this virtual visit: Katrina Benitez and Dr. Rogers Blocker   I discussed the limitations of evaluation and management by telemedicine and the availability of in person appointments. The patient expressed understanding and agreed to proceed.   Subjective:  Chief Complaint  Patient presents with  . Depression    HPI: The patient is a 73 y.o. female who presents today for depression and insomnia.   Depression: She has a lifelong history of intermittent "blues." She also has a family history of depression in her family. Paternal grandfather, father, brother all have "bad" depression. She has no history of si/hi/ah/vh. She has been on and off medication and thinks this is more situational in nature. She was on celexa for awhile but stopped this a year ago. She has also tried cymbalta and was going to do wellbutrin, but decided not to as her GYN told her it could cause hyperkalemia. Unsure of other medication. She feels like she is needing something at this time, as is her husband, as she is blue. Doesn't really like living in [place]. Denies any anxiety.   Insomnia: has had since she was a child. Wants to know if anything new on market. Takes ambien and valium prn. Practices sleep hygiene.   Review of Systems  Constitutional: Positive for fatigue.  Eyes: Negative for visual disturbance.  Respiratory: Positive for shortness of breath.   Cardiovascular: Negative for chest pain.  Gastrointestinal: Negative for abdominal pain and nausea.  Musculoskeletal: Positive for back pain. Negative for neck pain.  Neurological: Negative for dizziness and  headaches.  Psychiatric/Behavioral: Positive for sleep disturbance. The patient is not nervous/anxious.     Allergies Patient is allergic to fentanyl; ciprofloxacin; dilaudid [hydromorphone hcl]; flagyl [metronidazole]; and visipaque [iodixanol].  Past Medical History Patient  has a past medical history of Allergy, Anemia, Anxiety, Arthritis, Cardiac arrhythmia, Depression, Diverticulitis, Diverticulosis, GERD (gastroesophageal reflux disease), Heart murmur, HTN (hypertension), Infertility, female, Neuromuscular disorder ([place]), Peripheral neuropathy, Positive PPD, and SIADH (syndrome of inappropriate ADH production) ([place]).  Surgical History Patient  has a past surgical history that includes Lumbar disc surgery; Lumbar laminectomy; Tonsillectomy; Colonoscopy; Upper gastrointestinal endoscopy; Cervical polypectomy (02/2016); Breast biopsy (Left); Breast excisional biopsy (Left); Colposcopy; Dilation and curettage of uterus; Hysteroscopy; and Cervical biopsy w/ loop electrode excision.  Family History Pateint's family history includes Breast cancer in her mother; Depression in her brother and father; Early death in her father; Glaucoma in her mother; Heart attack in her father; Heart disease in her father; Hypertension in her brother; Miscarriages / [place] in her mother; Prostate cancer in her paternal grandfather; Tuberculosis in her paternal grandmother.  Social History Patient  reports that she has never smoked. She has never used smokeless tobacco. She reports current alcohol use of about 14.0 standard drinks of alcohol per week. She reports that she does not use drugs.    Objective: Vitals:   02/18/19 0908  BP: 120/70  Pulse: 60  Weight: 200 lb (90.7 kg)  Height: 5\' 6"  (1.676 m)    Body mass index is 32.28 kg/m.  Physical Exam Vitals signs reviewed.  Constitutional:      Appearance: Normal appearance.  HENT:  Head: Normocephalic and atraumatic.  Pulmonary:     Effort:  Pulmonary effort is normal.  Neurological:     General: No focal deficit present.     Mental Status: She is alert and oriented to person, place, and time.  Psychiatric:        Mood and Affect: Mood normal.        Behavior: Behavior normal.     Comments: No si/hi/ah/vh         Depression screen [facility] 2/9 02/18/2019 11/10/2017  Decreased Interest 2 0  Down, Depressed, Hopeless 2 0  PHQ - 2 Score 4 0  Altered sleeping 3 -  Tired, decreased energy 1 -  Change in appetite 0 -  Feeling bad or failure about yourself  0 -  Trouble concentrating 0 -  Moving slowly or fidgety/restless 0 -  Suicidal thoughts 0 -  PHQ-9 Score 8 -  Difficult doing work/chores Somewhat difficult -   GAD 7 : Generalized Anxiety Score 02/18/2019  Nervous, Anxious, on Edge 2  Control/stop worrying 2  Worry too much - different things 0  Trouble relaxing 0  Restless 0  Easily annoyed or irritable 0  Afraid - awful might happen 0  Total GAD 7 Score 4  Anxiety Difficulty Somewhat difficult      Assessment/plan: 1. Depression, recurrent ([place]) phq9 score is quite mild, but she has long history of being on medication and is ready to go back on something. Her husband also thinks she should be on something. We discussed options. Most of the SSRI/SNRI have siadh/hyponatremia as side effect and this makes her very nervous. Discussed wellbutrin and although has hyponatremia, no SIADH is listed. Does not cause hyperkalemia and we are going to do trial of this. She is interested in any weight loss component as well. No hx of seizures. We will start her on 150mg  XR and have her f/u in one month. She is to let us know if any issues in the meantime. No hx of si/hi/ah/vh, but if this is to happen she is to call 911 or go to ER. Also recommended exercising and counseling. Labs checked monthly for her siadh so reassured her we can monitor potassium.   2. Primary insomnia Already on ambien and valium which she has been on for  years. Discussed I do not like that she is on both of these, but the fact that she has been on for years will be hard to get her off of them. Will have her do trial of ashwagandha and see how she does. Already practicing sleep hygiene, but do recommend she starts exercising.    Return in about 1 month (around 03/21/2019) for depression/med f/u .  >30 minutes spent in counseling/medication management.    Katrina Flaming, MD [place]  02/18/2019

## 2019-02-18 NOTE — Telephone Encounter (Signed)
Spoke with Dr. Clydene Laming and reiterated to her that there is not a date to regularly bring patients back in the office at this time.  She agrees again to virtual visit. She will have VS and meds ready for pre-call.  Consent documented 01/24/2019.

## 2019-02-23 ENCOUNTER — Telehealth: Payer: Self-pay | Admitting: Radiology

## 2019-02-23 ENCOUNTER — Telehealth (INDEPENDENT_AMBULATORY_CARE_PROVIDER_SITE_OTHER): Payer: Medicare Other | Admitting: Cardiovascular Disease

## 2019-02-23 ENCOUNTER — Other Ambulatory Visit: Payer: Self-pay

## 2019-02-23 ENCOUNTER — Encounter: Payer: Self-pay | Admitting: Cardiovascular Disease

## 2019-02-23 VITALS — BP 122/58 | HR 68 | Ht 66.0 in

## 2019-02-23 DIAGNOSIS — I480 Paroxysmal atrial fibrillation: Secondary | ICD-10-CM | POA: Diagnosis not present

## 2019-02-23 NOTE — Addendum Note (Signed)
Addended by: Harland German A on: 02/23/2019 01:53 PM   Modules accepted: Orders

## 2019-02-23 NOTE — Progress Notes (Signed)
Virtual Visit via Video Note   This visit type was conducted due to national recommendations for restrictions regarding the COVID-19 Pandemic (e.g. social distancing) in an effort to limit this patient's exposure and mitigate transmission in our community.  Due to her co-morbid illnesses, this patient is at least at moderate risk for complications without adequate follow up.  This format is felt to be most appropriate for this patient at this time.  All issues noted in this document were discussed and addressed.  A limited physical exam was performed with this format.  Please refer to the patient's chart for her consent to telehealth for [facility].   Date:  02/23/2019   ID:  Katrina Benitez, DOB 1946-03-08, MRN [id number]  Patient Location: Home Provider Location: Home  PCP:  Katrina Flaming, MD  Cardiologist:  Katrina Mocha, MD  Electrophysiologist:  None   Evaluation Performed:  Follow-Up Visit  Chief Complaint: Follow-up atrial fibrillation  History of Present Illness:    Katrina Benitez is a 73 y.o. female with recent history of hospital admission with fever, cough, and pulmonary infection. She ruled out for Covid-19.  She has subsequently recovered and is feeling much better.  She does have some exertional dyspnea but no other complaints.  She denies chest pain, heart palpitations, recurrent fever, edema, orthopnea, PND, lightheadedness, or syncope.  The patient does not have symptoms concerning for COVID-19 infection (fever, chills, cough, or new shortness of breath).    Past Medical History:  Diagnosis Date  . Allergy   . Anemia   . Anxiety   . Arthritis    djd  . Cardiac arrhythmia   . Depression   . Diverticulitis   . Diverticulosis   . GERD (gastroesophageal reflux disease)   . Heart murmur   . HTN (hypertension)   . Infertility, female   . Neuromuscular disorder (Katrina Benitez)    idiopathic peripheral  neuropathy  . Peripheral neuropathy   . Positive PPD   . SIADH  (syndrome of inappropriate ADH production) ([place])    Past Surgical History:  Procedure Laterality Date  . BREAST BIOPSY Left   . BREAST EXCISIONAL BIOPSY Left    benign  . CERVICAL BIOPSY  W/ LOOP ELECTRODE EXCISION    . CERVICAL POLYPECTOMY  02/2016  . COLONOSCOPY    . COLPOSCOPY    . DILATION AND CURETTAGE OF UTERUS    . HYSTEROSCOPY    . LUMBAR DISC SURGERY    . LUMBAR LAMINECTOMY    . TONSILLECTOMY    . UPPER GASTROINTESTINAL ENDOSCOPY       Current Meds  Medication Sig  . acetaminophen (TYLENOL) 325 MG tablet Take 2 tablets (650 mg total) by mouth every 4 (four) hours as needed for mild pain, fever or headache.  Marland Kitchen apixaban (ELIQUIS) 5 MG TABS tablet Take 1 tablet (5 mg total) by mouth 2 (two) times daily.  Marland Kitchen buPROPion (WELLBUTRIN XL) 150 MG 24 hr tablet Take 1 tablet (150 mg total) by mouth daily.  . diazepam (VALIUM) 10 MG tablet TAKE ONE TABLET DAILY AS NEEDED  . gabapentin (NEURONTIN) 600 MG tablet Take 1 tablet (600 mg total) by mouth 3 (three) times daily.  . hydrOXYzine (ATARAX/VISTARIL) 25 MG tablet Take 1 tablet (25 mg total) by mouth 3 (three) times daily as needed.  . metoprolol tartrate (LOPRESSOR) 50 MG tablet Take 1 tablet (50 mg total) by mouth 2 (two) times daily.  . ondansetron (ZOFRAN) 8 MG tablet Take 8 mg  by mouth daily as needed for nausea.   . Probiotic Product (ALIGN PO) Take by mouth daily.  Marland Kitchen zolpidem (AMBIEN) 10 MG tablet Take 1 tablet (10 mg total) by mouth at bedtime as needed.    Allergies:   Fentanyl; Ciprofloxacin; Dilaudid [hydromorphone hcl]; Flagyl [metronidazole]; and Visipaque [iodixanol]   Social History   Tobacco Use  . Smoking status: Never Smoker  . Smokeless tobacco: Never Used  Substance Use Topics  . Alcohol use: Yes    Alcohol/week: 14.0 standard drinks    Types: 14 Glasses of wine per week  . Drug use: No     Family Hx: The patient's family history includes Breast cancer in her mother; Depression in her brother and father;  Early death in her father; Glaucoma in her mother; Heart attack in her father; Heart disease in her father; Hypertension in her brother; Miscarriages / [place] in her mother; Prostate cancer in her paternal grandfather; Tuberculosis in her paternal grandmother. There is no history of Colon cancer, Esophageal cancer, Rectal cancer, or Stomach cancer.  ROS:   Please see the history of present illness.    All other systems reviewed and are negative.   Prior CV studies:   The following studies were reviewed today:  Echo 01/21/2019: IMPRESSIONS    1. The left ventricle has normal systolic function with an ejection fraction of 60-65%. The cavity size was normal. Indeterminate diastolic filling due to E-A fusion.  2. The right ventricle has normal systolic function. The cavity was mildly enlarged. There is no increase in right ventricular wall thickness.  3. Right atrial size was mildly dilated.  4. The mitral valve is abnormal. Mild thickening of the mitral valve leaflet.  5. The aortic valve is tricuspid. Mild thickening of the aortic valve. Aortic valve regurgitation is mild by color flow Doppler.  FINDINGS  Left Ventricle: The left ventricle has normal systolic function, with an ejection fraction of 60-65%. The cavity size was normal. There is no increase in left ventricular wall thickness. Indeterminate diastolic filling due to E-A fusion.  Right Ventricle: The right ventricle has normal systolic function. The cavity was mildly enlarged. There is no increase in right ventricular wall thickness.  Left Atrium: Left atrial size was normal in size.  Right Atrium: Right atrial size was mildly dilated. Right atrial pressure is estimated at 8 mmHg.  Interatrial Septum: No atrial level shunt detected by color flow Doppler.  Pericardium: There is no evidence of pericardial effusion.  Mitral Valve: The mitral valve is abnormal. Mild thickening of the mitral valve leaflet. Mitral valve  regurgitation is mild by color flow Doppler.  Tricuspid Valve: The tricuspid valve is normal in structure. Tricuspid valve regurgitation is trivial by color flow Doppler.  Aortic Valve: The aortic valve is tricuspid Mild thickening of the aortic valve. Aortic valve regurgitation is mild by color flow Doppler.  Pulmonic Valve: The pulmonic valve was grossly normal. Pulmonic valve regurgitation is not visualized by color flow Doppler.   Labs/Other Tests and Data Reviewed:    EKG:  An ECG dated 01/21/2019 was personally reviewed today and demonstrated:  Normal sinus rhythm 63 bpm, within normal limits  Recent Labs: 07/13/2018: ALT 16 01/19/2019: B Natriuretic Peptide 583.4; TSH 1.006 01/20/2019: Hemoglobin 11.6; Platelets 219 01/21/2019: BUN 7; Creatinine, Ser 0.78; Magnesium 1.9; Potassium 3.9; Sodium 132   Recent Lipid Panel Lab Results  Component Value Date/Time   CHOL 227 (H) 01/20/2019 05:00 AM   TRIG 91 01/20/2019 05:00 AM  HDL 118 01/20/2019 05:00 AM   CHOLHDL 1.9 01/20/2019 05:00 AM   LDLCALC 91 01/20/2019 05:00 AM    Wt Readings from Last 3 Encounters:  02/18/19 200 lb (90.7 kg)  01/24/19 200 lb (90.7 kg)  01/20/19 200 lb 2.8 oz (90.8 kg)     Objective:    Vital Signs:  BP (!) 122/58 (BP Location: Left Arm, Patient Position: Sitting, Cuff Size: Normal)   Pulse 68   Ht 5\' 6"  (1.676 m)   LMP  (LMP Unknown)   BMI 32.28 kg/m    VITAL SIGNS:  reviewed Alert, oriented, in no distress.  Breathing comfortably in normal conversation.  Remaining exam deferred secondary to telehealth visit type.  ASSESSMENT & PLAN:    1. Paroxysmal atrial fibrillation: Tolerating oral anticoagulation with apixaban.  Occurred in setting of febrile illness/pulmonary infection.  The patient has normal LV function and no valvular disease.  Since her episode of atrial fibrillation occurred in the context of a medical illness, long-term oral anticoagulation is probably not necessary.  The patient  has a strong desire to get off of apixaban if possible.  We will check a 2-week event monitor and if she is maintaining sinus rhythm, I am inclined to discontinue apixaban.  She will remain on metoprolol.  COVID-19 Education: The signs and symptoms of COVID-19 were discussed with the patient and how to seek care for testing (follow up with PCP or arrange E-visit). The importance of social distancing was discussed today.  Time:   Today, I have spent 20 minutes with the patient with telehealth technology discussing the above problems.    Medication Adjustments/Labs and Tests Ordered: Current medicines are reviewed at length with the patient today.  Concerns regarding medicines are outlined above.   Tests Ordered: No orders of the defined types were placed in this encounter.  Medication Changes: No orders of the defined types were placed in this encounter.   Disposition:  Follow up in 4 month(s)  Signed, Katrina Mocha, MD  02/23/2019 1:04 PM    [place]

## 2019-02-23 NOTE — Patient Instructions (Signed)
Medication Instructions: Your provider recommends that you continue on your current medications as directed. Please refer to the Current Medication list given to you today.    Testing/Procedures: Dr. Burt Knack recommends you wear a heart monitor for 2 weeks. You will be called with more information on the monitor and it will be mailed to you.  Follow-Up: Your provider recommends that you schedule a follow-up appointment in 4 months with Dr. Burt Knack. The clinical schedule is not year available for October. You will be called to arrange an appointment when the schedule is open.

## 2019-02-23 NOTE — Telephone Encounter (Signed)
Enrolled patient for a 14 day Zio monitor to be mailed. Brief instructions were gone over with the patient and she knows to expect the monitor to arrive in 3-4 days.  

## 2019-02-25 ENCOUNTER — Telehealth: Payer: Self-pay | Admitting: Family Medicine

## 2019-02-25 ENCOUNTER — Telehealth: Payer: Self-pay | Admitting: Cardiovascular Disease

## 2019-02-25 NOTE — Telephone Encounter (Signed)
New message   Pt c/o medication issue:  1. Name of Medication: metoprolol tartrate (LOPRESSOR) 50 MG tablet  2. How are you currently taking this medication (dosage and times per day)? Twice daily  3. Are you having a reaction (difficulty breathing--STAT)? No   4. What is your medication issue? Patient would like to discuss if this dosage is too high. Please call discuss.

## 2019-02-25 NOTE — Telephone Encounter (Signed)
Everything is in. Vitamin D actually expires in august. She just needs to tell [facility] that she has standing bmp orders and that there is a future order for vitamin D.

## 2019-02-25 NOTE — Telephone Encounter (Signed)
Called patient to advise she could go to [facility] to have the labs and the orders were placed. She asked if Dr. Rogers Blocker could add her Vitamin D order as well. I looked and it expired on 02/18/19. Please add vitamin D order if possible and contact patient so she knows it's ordered and she can go to [facility].   Copied from [place] [phone]. Topic: General - Other >> Feb 25, 2019  8:23 AM [name] wrote: Reason for CRM: Patient called to say that she have a standing order for BMP abd she is in town today but want to go to [facility] [address] to have her blood drawn she is asking for a call back to have that done today if possible please. Please call patient at Ph# [phone]

## 2019-02-25 NOTE — Telephone Encounter (Signed)
Called patient and left her VM to let know that labs are in and she can go to [facility] for this.  Orma Flaming, MD [place]

## 2019-02-25 NOTE — Telephone Encounter (Signed)
I spoke to the patient and she meant to ask Dr Burt Knack if she could reduce her Metoprolol Tartrate dosage.  She is presently taking 50 mg bid.  Please advise, thank you.

## 2019-02-27 NOTE — Telephone Encounter (Signed)
I would be fine with her reducing the dose to 25 mg BID thanks

## 2019-02-28 MED ORDER — METOPROLOL TARTRATE 25 MG PO TABS: 25.0000 mg | ORAL_TABLET | Freq: Two times a day (BID) | ORAL | 11 refills | Status: DC

## 2019-02-28 NOTE — Telephone Encounter (Signed)
Informed patient she may DECREASE METOPROLOL to 25 mg BID.  Called in to her pharmacy of choice. She was grateful for assistance.   Confirmed with [facility] November they will deliver to her home.

## 2019-03-05 ENCOUNTER — Ambulatory Visit (INDEPENDENT_AMBULATORY_CARE_PROVIDER_SITE_OTHER): Payer: Medicare Other

## 2019-03-05 DIAGNOSIS — I480 Paroxysmal atrial fibrillation: Secondary | ICD-10-CM | POA: Diagnosis not present

## 2019-03-09 ENCOUNTER — Telehealth: Payer: Self-pay

## 2019-03-09 ENCOUNTER — Other Ambulatory Visit: Payer: Self-pay | Admitting: Family Medicine

## 2019-03-09 NOTE — Telephone Encounter (Signed)
Medication was just started may 29th. I recommend continuing for at least a month on this dose and then f/u visit to discuss her response. Please have her schedule. Thanks.

## 2019-03-09 NOTE — Telephone Encounter (Signed)
Incoming call from patient.  She is currently taking Wellbutrin XL 150 mg qd, feels that she would benefit from increasing to 300 mg qd (patient is retired Doctor, hospital) She denies any suicidal thoughts, just feels that a little extra medication would be helpful.  She is also requesting a refill for her hydroxyzine.  [place] for both? Or schedule doxy visit?  Please advise.

## 2019-03-09 NOTE — Telephone Encounter (Signed)
Spoke to patient and advised of recommendations per Dr. Jonni Sanger.  Patient verbalizes understanding.  She will c/b to schedule appointment at the end of this month if she still feels the need for medication to be adjusted.

## 2019-03-22 ENCOUNTER — Other Ambulatory Visit (INDEPENDENT_AMBULATORY_CARE_PROVIDER_SITE_OTHER): Payer: Medicare Other

## 2019-03-22 DIAGNOSIS — E559 Vitamin D deficiency, unspecified: Secondary | ICD-10-CM | POA: Diagnosis not present

## 2019-03-22 DIAGNOSIS — E222 Syndrome of inappropriate secretion of antidiuretic hormone: Secondary | ICD-10-CM | POA: Diagnosis not present

## 2019-03-22 LAB — BASIC METABOLIC PANEL
BUN: 12 mg/dL (ref 6–23)
CO2: 26 mEq/L (ref 19–32)
Calcium: 8.8 mg/dL (ref 8.4–10.5)
Chloride: 97 mEq/L (ref 96–112)
Creatinine, Ser: 0.81 mg/dL (ref 0.40–1.20)
GFR: 69.33 mL/min (ref >60.00)
Glucose, Bld: 94 mg/dL (ref 70–99)
Potassium: 4.6 mEq/L (ref 3.5–5.1)
Sodium: 130 mEq/L — ABNORMAL LOW (ref 135–145)

## 2019-03-22 LAB — VITAMIN D 25 HYDROXY (VIT D DEFICIENCY, FRACTURES): VITD: 28.78 ng/mL — ABNORMAL LOW (ref 30.00–100.00)

## 2019-03-24 NOTE — Progress Notes (Signed)
Please call patient: I have reviewed his/her lab results. Apparently lab visit for f/u hyponatremia which persists. It appears to be chronic. Dr. Rogers Blocker will f/u. For now, no new recs except limit fluid to 1522ml / day and high sodium diet.  Vit D is a little low. rec vit D 4000 units daily.

## 2019-03-28 ENCOUNTER — Other Ambulatory Visit: Payer: Self-pay

## 2019-03-28 ENCOUNTER — Ambulatory Visit
Admission: RE | Admit: 2019-03-28 | Discharge: 2019-03-28 | Disposition: A | Discharge status: Home / Self Care | Payer: Medicare Other | Source: Ambulatory Visit | Attending: Obstetrics & Gynecology | Admitting: Obstetrics & Gynecology

## 2019-03-28 DIAGNOSIS — Z780 Asymptomatic menopausal state: Secondary | ICD-10-CM | POA: Diagnosis not present

## 2019-03-28 DIAGNOSIS — M85832 Other specified disorders of bone density and structure, left forearm: Secondary | ICD-10-CM | POA: Diagnosis not present

## 2019-03-28 DIAGNOSIS — M85852 Other specified disorders of bone density and structure, left thigh: Secondary | ICD-10-CM | POA: Diagnosis not present

## 2019-03-28 DIAGNOSIS — Z1231 Encounter for screening mammogram for malignant neoplasm of breast: Secondary | ICD-10-CM

## 2019-03-28 DIAGNOSIS — E2839 Other primary ovarian failure: Secondary | ICD-10-CM

## 2019-03-30 ENCOUNTER — Telehealth: Payer: Self-pay | Admitting: Obstetrics & Gynecology

## 2019-03-30 DIAGNOSIS — I480 Paroxysmal atrial fibrillation: Secondary | ICD-10-CM | POA: Diagnosis not present

## 2019-03-30 DIAGNOSIS — R102 Pelvic and perineal pain: Secondary | ICD-10-CM

## 2019-03-30 NOTE — Telephone Encounter (Signed)
Ok to schedule PUS and I will do pelvic exam same day.  Also, she and I can discusses the benefit of adjuvant imaging with family history of breast cancer as she may be a candidate for breast MRI which is a much better test than breast ultrasound.  She and I will discuss this when she comes for the PUS.

## 2019-03-30 NOTE — Telephone Encounter (Signed)
Spoke with patient. She is complaining of #1)lower pelvic pain x 3-4 days. Denies fever, N & V, bowel or urinary symptoms or vaginal bleeding. Patient states this is how she felt when found to have endometrial polyps. Patient requesting PUS to rule endometrial polyps. Advised probably needs office visit for further evaluation first--patient would like me to discuss with Dr.Miller.   #2) Patient had her mammogram 03-28-19 which was SLP:[id number] with density C. She states with her breasts being so dense she would feel better if she had bilateral US, since it is hard to "pick up everything" on dense breasts. Advised patient her mammogram looked great. Patient states her mom was diagnosed with breast cancer in her 33's and just wants to make sure not missing anything. Again, advised patient mammogram was normal and insurance may not cover any further testing but will discuss with Dr.Miller.  Routed to provider

## 2019-03-30 NOTE — Telephone Encounter (Signed)
Patient is concerned with pelvic pain. [phone] [id number]

## 2019-03-31 ENCOUNTER — Other Ambulatory Visit: Payer: Self-pay

## 2019-03-31 ENCOUNTER — Telehealth: Payer: Self-pay | Admitting: Cardiovascular Disease

## 2019-03-31 NOTE — Telephone Encounter (Signed)
New Message:    Pt;s husband wants to know when will pt be able to stop taking her blood thinner.

## 2019-03-31 NOTE — Telephone Encounter (Signed)
Called patient and left message with husband to return my call.

## 2019-03-31 NOTE — Telephone Encounter (Signed)
Spoke with patient and notified of Dr.Miller's recommendations below. Scheduled PUS for 04-07-19 2:00pm with consult following at 2:30pm with Dr.Miller. She's aware will have pelvic exam same day and discuss further recommendations regarding possible breast MRI.  Pre-cert sent to [facility]. Order placed.

## 2019-04-01 NOTE — Telephone Encounter (Signed)
Discussed monitor results with Dr. Burt Knack. Per Dr. Burt Knack, informed the patient of results and that she can STOP ELIQUIS.  Scheduled her for 4 month visit in October. She was grateful for assistance.

## 2019-04-04 DIAGNOSIS — H0012 Chalazion right lower eyelid: Secondary | ICD-10-CM | POA: Diagnosis not present

## 2019-04-05 ENCOUNTER — Other Ambulatory Visit: Payer: Self-pay

## 2019-04-07 ENCOUNTER — Other Ambulatory Visit: Payer: Medicare Other | Admitting: Obstetrics & Gynecology

## 2019-04-07 ENCOUNTER — Other Ambulatory Visit: Payer: Medicare Other

## 2019-04-07 NOTE — Telephone Encounter (Signed)
Patient canceled her PUS/Consult appointment today. She said "I woke up feeling really sick". She would like to reschedule. To triage to reschedule.

## 2019-04-07 NOTE — Telephone Encounter (Signed)
Spoke with patient and rescheduled PUS/Consult for 04-21-19 2:30 and consult with Dr.Miller at 3:00pm.

## 2019-04-07 NOTE — Telephone Encounter (Signed)
Left message to call Charmayne Odell, CMA. °

## 2019-04-07 NOTE — Telephone Encounter (Signed)
Patient returned call

## 2019-04-13 ENCOUNTER — Telehealth: Payer: Self-pay | Admitting: Family Medicine

## 2019-04-13 ENCOUNTER — Other Ambulatory Visit: Payer: Self-pay | Admitting: Family Medicine

## 2019-04-13 NOTE — Telephone Encounter (Signed)
MEDICATION: zolpidem (AMBIEN) 10 MG tablet  PHARMACY:   [facility] - [place], [place] - [address] [phone] (Phone) [phone] (Fax)     IS THIS A 90 DAY SUPPLY : yes  IS PATIENT OUT OF MEDICATION:  Yes  IF NOT; HOW MUCH IS LEFT:   LAST APPOINTMENT DATE: @6 /17/2020  NEXT APPOINTMENT DATE:@8 /08/2019  OTHER COMMENTS:    **Let patient know to contact pharmacy at the end of the day to make sure medication is ready. **  ** Please notify patient to allow 48-72 hours to process**  **Encourage patient to contact the pharmacy for refills or they can request refills through [facility]**

## 2019-04-14 ENCOUNTER — Encounter: Payer: Self-pay | Admitting: Family Medicine

## 2019-04-14 ENCOUNTER — Other Ambulatory Visit: Payer: Self-pay | Admitting: *Deleted

## 2019-04-14 ENCOUNTER — Ambulatory Visit (INDEPENDENT_AMBULATORY_CARE_PROVIDER_SITE_OTHER): Payer: Medicare Other | Admitting: Family Medicine

## 2019-04-14 ENCOUNTER — Telehealth: Payer: Self-pay | Admitting: Family Medicine

## 2019-04-14 DIAGNOSIS — F419 Anxiety disorder, unspecified: Secondary | ICD-10-CM

## 2019-04-14 DIAGNOSIS — R110 Nausea: Secondary | ICD-10-CM

## 2019-04-14 DIAGNOSIS — F339 Major depressive disorder, recurrent, unspecified: Secondary | ICD-10-CM

## 2019-04-14 MED ORDER — DIAZEPAM 10 MG PO TABS: 10.0000 mg | ORAL_TABLET | Freq: Two times a day (BID) | ORAL | 0 refills | Status: DC | PRN

## 2019-04-14 MED ORDER — ONDANSETRON HCL 8 MG PO TABS: 8.0000 mg | ORAL_TABLET | Freq: Every day | ORAL | 0 refills | Status: DC | PRN

## 2019-04-14 MED ORDER — VORTIOXETINE HBR 10 MG PO TABS: 10.0000 mg | ORAL_TABLET | Freq: Every day | ORAL | 11 refills | Status: DC

## 2019-04-14 NOTE — Telephone Encounter (Signed)
Called pt, she is aware RX refilled

## 2019-04-14 NOTE — Telephone Encounter (Signed)
Pt had tele vist with dr Jonni Sanger today and was prescribed medication and the med is causing her nausea and she would  ondansetron 8 mg disintegrating pills. [facility]

## 2019-04-14 NOTE — Progress Notes (Signed)
TELEPHONE ENCOUNTER   Patient verbally agreed to telephone visit and is aware that copayment and coinsurance may apply. Patient was treated using telemedicine according to accepted telemedicine protocols.  Location of the patient: home Location of provider: [place], [place] [facility] office Names of all persons participating in the telemedicine service and role in the encounter: Leamon Arnt, MD Lilli Light, CMA   Subjective  CC:  Chief Complaint  Patient presents with  . Anxiety    Has had increased diarrhea and nausea for 3 days, increased anxiety, and unable to sleep   Same day acute visit; PCP not available. New pt to me. Chart reviewed.   HPI: Katrina Benitez is a 73 y.o. female who was telephoned today to address the problems listed above in the chief complaint.  Hasn't felt well for 3 days. Suffering from horrible anxiety. Has nausea and no appetite.  Very convoluted historian: reports depressive and anxiety sxs that are worsening due to home circumstances. Has been on wellbutrin x 2 months w/o improvement in sxs. Now with decreased appetite and nause w/o vomiting or abdominal pain. Also says she had constipation and took MOM with "muddy" results and dark stools. Denies gross blood in stool. Last colonoscopy 2013. Reports chronic stomach problems. No SI. No panic attacks. Is on chronic valium and ambien for sleep. Had been taking valium twice a day on many days so is now running low on supply and hasn't taken any in a week.   She refuses ov at this time due to covid fears. She declines psychiatrist or counseling.   ROS: neg f/c/s, sob, cp ASSESSMENT: 1. Depression, recurrent ([place])   2. Anxiety   3. Nausea      Depression and anxiety:  Very active with secondary somatic sxs vs other GI process. Discussed recommendation for in office evaluation but pt declines  Start daily valium; try to get trintellix approved: has failed celexa, wellbutrin and other ssri in the  past  Discussed red flag sxs and need for further evaluation.   Close f/u: recheck one week.  Time spent with the patient (non face-to-face time during this virtual encounter): 30 minutes, spent in obtaining information about her symptoms, reviewing her previous labs, evaluations, and treatments, counseling her about her condition (please see the discussed topics above), and developing a plan to further investigate it; the patient was provided an opportunity to ask questions and all were answered. The patient agreed with the plan and demonstrated an understanding of the instructions.   The patient was advised to call back or seek an in-person evaluation if the symptoms worsen or if the condition fails to improve as anticipated.  Follow up: No follow-ups on file.  05/04/2019  No orders of the defined types were placed in this encounter.  Meds ordered this encounter  Medications  . vortioxetine HBr (TRINTELLIX) 10 MG TABS tablet    Sig: Take 1 tablet (10 mg total) by mouth daily.    Dispense:  30 tablet    Refill:  11  . diazepam (VALIUM) 10 MG tablet    Sig: Take 1 tablet (10 mg total) by mouth 2 (two) times daily as needed for anxiety.    Dispense:  60 tablet    Refill:  0     I reviewed the patients updated PMH, FH, and SocHx.    Patient Active Problem List   Diagnosis Date Noted  . Depression, recurrent ([place]) 02/18/2019  . Primary insomnia 02/18/2019  . Atrial  fibrillation with RVR ([place]) 01/19/2019  . Atypical pneumonia 01/19/2019  . Alcohol use 01/19/2019  . Hyperlipidemia 09/30/2018  . Post herpetic neuralgia 08/12/2018  . Idiopathic peripheral neuropathy 08/12/2018  . Vitamin D deficiency 07/22/2018  . 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 Opacity noted on imaging study   . Chest pain 01/21/2016  . SIADH (syndrome of inappropriate ADH production) ([place]) 01/21/2016  . Hypertension 01/21/2016  . Anxiety 01/21/2016  . GERD (gastroesophageal reflux  disease) 01/21/2016  . Abnormality of gait 12/12/2015   Current Meds  Medication Sig  . diazepam (VALIUM) 10 MG tablet Take 1 tablet (10 mg total) by mouth 2 (two) times daily as needed for anxiety.  . gabapentin (NEURONTIN) 600 MG tablet Take 1 tablet (600 mg total) by mouth 3 (three) times daily.  . metoprolol tartrate (LOPRESSOR) 25 MG tablet Take 1 tablet (25 mg total) by mouth 2 (two) times daily.  . ondansetron (ZOFRAN) 8 MG tablet Take 8 mg by mouth daily as needed for nausea.   . Probiotic Product (ALIGN PO) Take by mouth daily.  Marland Kitchen zolpidem (AMBIEN) 10 MG tablet TAKE ONE TABLET AT BEDTIME AS NEEDED  . [DISCONTINUED] acetaminophen (TYLENOL) 325 MG tablet Take 2 tablets (650 mg total) by mouth every 4 (four) hours as needed for mild pain, fever or headache.  . [DISCONTINUED] buPROPion (WELLBUTRIN XL) 150 MG 24 hr tablet Take 1 tablet (150 mg total) by mouth daily.  . [DISCONTINUED] diazepam (VALIUM) 10 MG tablet TAKE ONE TABLET DAILY AS NEEDED  . [DISCONTINUED] hydrOXYzine (ATARAX/VISTARIL) 25 MG tablet TAKE ONE TABLET THREE TIMES DAILY AS NEEDED    Allergies: Patient is allergic to fentanyl; ciprofloxacin; dilaudid [hydromorphone hcl]; flagyl [metronidazole]; and visipaque [iodixanol]. Family History: Patient family history includes Breast cancer in her mother; Depression in her brother and father; Early death in her father; Glaucoma in her mother; Heart attack in her father; Heart disease in her father; Hypertension in her brother; Miscarriages / [place] in her mother; Prostate cancer in her paternal grandfather; Tuberculosis in her paternal grandmother. Social History:  Patient  reports that she has never smoked. She has never used smokeless tobacco. She reports current alcohol use of about 14.0 standard drinks of alcohol per week. She reports that she does not use drugs.  Review of Systems: Constitutional: Negative for fever malaise or anorexia Cardiovascular: negative for  chest pain Respiratory: negative for SOB or persistent cough Gastrointestinal: negative for abdominal pain

## 2019-04-15 ENCOUNTER — Telehealth: Payer: Self-pay | Admitting: Family Medicine

## 2019-04-15 NOTE — Telephone Encounter (Signed)
Medication Refill - Medication:  ondansetron ( Pharmacy called and stated that medication needs to be sent over in the form that the patient requested.) Pharmacy would like a call back at  [phone] Has the patient contacted their pharmacy? Yes (Agent: If no, request that the patient contact the pharmacy for the refill.) (Agent: If yes, when and what did the pharmacy advise?)Contact PCP  Preferred Pharmacy (with phone number or street name):  [facility], [place] - [address] [phone] (Phone) [phone] (Fax)     [place] days. We ask that you follow-up with your pharmacy.

## 2019-04-15 NOTE — Telephone Encounter (Signed)
See note  Copied from [place] [phone]. Topic: General - Other >> Apr 15, 2019  1:00 PM Mcneil, Ja-Kwan wrote: Reason for CRM: Pt stated that she received the incorrect form of ondansetron (ZOFRAN) 8 MG tablet. Pt requests call back

## 2019-04-18 NOTE — Telephone Encounter (Signed)
Please fill the formulation that she wants. Again, if nausea/vomiting is not improving, then she needs an OV.

## 2019-04-19 ENCOUNTER — Other Ambulatory Visit: Payer: Self-pay

## 2019-04-19 MED ORDER — ONDANSETRON 8 MG PO TBDP: 8.0000 mg | ORAL_TABLET | Freq: Three times a day (TID) | ORAL | 0 refills | Status: DC | PRN

## 2019-04-19 NOTE — Telephone Encounter (Signed)
LMOVM for pt to return call 

## 2019-04-19 NOTE — Telephone Encounter (Signed)
Spoke to pt and sent correct Zofran to pharmacy

## 2019-04-21 ENCOUNTER — Other Ambulatory Visit: Payer: Medicare Other

## 2019-04-21 ENCOUNTER — Ambulatory Visit (INDEPENDENT_AMBULATORY_CARE_PROVIDER_SITE_OTHER): Payer: Medicare Other

## 2019-04-21 ENCOUNTER — Ambulatory Visit (INDEPENDENT_AMBULATORY_CARE_PROVIDER_SITE_OTHER): Payer: Medicare Other | Admitting: Obstetrics & Gynecology

## 2019-04-21 ENCOUNTER — Encounter: Payer: Self-pay | Admitting: Obstetrics & Gynecology

## 2019-04-21 ENCOUNTER — Other Ambulatory Visit: Payer: Self-pay

## 2019-04-21 VITALS — BP 130/86 | HR 64 | Temp 97.6°F

## 2019-04-21 DIAGNOSIS — D251 Intramural leiomyoma of uterus: Secondary | ICD-10-CM | POA: Diagnosis not present

## 2019-04-21 DIAGNOSIS — R102 Pelvic and perineal pain: Secondary | ICD-10-CM | POA: Diagnosis not present

## 2019-04-21 NOTE — Patient Instructions (Addendum)
[facility] [address] [place], [place] [address]   Office : [phone]  Fax : [phone]

## 2019-04-21 NOTE — Progress Notes (Signed)
GYNECOLOGY  VISIT  CC:   Pelvic pain  HPI: 73 y.o. G0P0000 Married White or Caucasian female here for PUS due to pelvic pain.  Pt has hx of endometrial polyp that occurred with similar symptoms.    She has finally seen Dr. Ubaldo Glassing.  She was experiencing overall body itching.  First treatment was a topical steroid, then wrapping the skin, and cool wraps.  This did nothing.  She was then treated with a course of oral steroids.  This helped symptoms but they are gradually returning.  She has used some intermittent hydroxyzine.  This caused constipation.  Milk of magnesia was used and then her bowel movements were more like "mud".  She also reports nausea for about a week.  Sees Dr. Rogers Blocker who is out on maternity leave.  Had video with Dr. Jonni Sanger.  Nausea was treated with Zofran.  She has now started Trintellix.    GI is Dr. Henrene Pastor who suggested metamucil every day.  She is considering having another colonoscopy.    She also had an afib episode that was related to atypical pneumonia.  She was discharged on Eliquis and metoprolol.  She is now off the blood thinner.    Ultrasound: Uterus:  6.1 x 4.3 x 3.2cm with two fibroids 2.9 x 2.2cm and 1.0 x 0.9cm.  (In comparison to ultrasound from [facility] 10/2017 these are fairly stable) Endometrium:  2.8 - 2.80mm Left ovary:  1.7 x 0.9 x 0.6cm Right ovary:  0.8 x 0.6 x 0.5cm Cul de sac:  No free fluid  GYNECOLOGIC HISTORY: No LMP recorded (lmp unknown). Patient is postmenopausal. Contraception: PMP  Patient Active Problem List   Diagnosis Date Noted  . Depression, recurrent ([place]) 02/18/2019  . Primary insomnia 02/18/2019  . Atrial fibrillation with RVR ([place]) 01/19/2019  . Atypical pneumonia 01/19/2019  . Alcohol use 01/19/2019  . Hyperlipidemia 09/30/2018  . Post herpetic neuralgia 08/12/2018  . Idiopathic peripheral neuropathy 08/12/2018  . Vitamin D deficiency 07/22/2018  . 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 Opacity noted on imaging  study   . Chest pain 01/21/2016  . SIADH (syndrome of inappropriate ADH production) ([place]) 01/21/2016  . Hypertension 01/21/2016  . Anxiety 01/21/2016  . GERD (gastroesophageal reflux disease) 01/21/2016  . Abnormality of gait 12/12/2015    Past Medical History:  Diagnosis Date  . Allergy   . Anemia   . Anxiety   . Arthritis    djd  . Cardiac arrhythmia   . Depression   . Diverticulitis   . Diverticulosis   . GERD (gastroesophageal reflux disease)   . Heart murmur   . HTN (hypertension)   . Infertility, female   . Neuromuscular disorder ([place])    idiopathic peripheral  neuropathy  . Peripheral neuropathy   . Positive PPD   . SIADH (syndrome of inappropriate ADH production) ([place])     Past Surgical History:  Procedure Laterality Date  . BREAST BIOPSY Left   . BREAST EXCISIONAL BIOPSY Left    benign  . CERVICAL BIOPSY  W/ LOOP ELECTRODE EXCISION    . CERVICAL POLYPECTOMY  02/2016  . COLONOSCOPY    . COLPOSCOPY    . DILATION AND CURETTAGE OF UTERUS    . HYSTEROSCOPY    . LUMBAR DISC SURGERY    . LUMBAR LAMINECTOMY    . TONSILLECTOMY    . UPPER GASTROINTESTINAL ENDOSCOPY      MEDS:   Current Outpatient Medications on File Prior to Visit  Medication Sig Dispense Refill  . augmented betamethasone dipropionate (DIPROLENE-AF) 0.05 % cream     . diazepam (VALIUM) 10 MG tablet Take 1 tablet (10 mg total) by mouth 2 (two) times daily as needed for anxiety. 60 tablet 0  . gabapentin (NEURONTIN) 600 MG tablet Take 1 tablet (600 mg total) by mouth 3 (three) times daily. 270 tablet 1  . metoprolol tartrate (LOPRESSOR) 25 MG tablet Take 1 tablet (25 mg total) by mouth 2 (two) times daily. 60 tablet 11  . ondansetron (ZOFRAN) 8 MG tablet Take 1 tablet (8 mg total) by mouth daily as needed for nausea. 20 tablet 0  . Probiotic Product (ALIGN PO) Take by mouth daily.    . sodium chloride 1 g tablet Take 1 g by mouth 3 (three) times daily.    Marland Kitchen triamcinolone cream (KENALOG) 0.1 %      . vortioxetine HBr (TRINTELLIX) 10 MG TABS tablet Take 1 tablet (10 mg total) by mouth daily. 30 tablet 11  . zolpidem (AMBIEN) 10 MG tablet TAKE ONE TABLET AT BEDTIME AS NEEDED 90 tablet 0   No current facility-administered medications on file prior to visit.     ALLERGIES: Metronidazole, Fentanyl, Ciprofloxacin, Dilaudid [hydromorphone hcl], and Visipaque [iodixanol]  Family History  Problem Relation Age of Onset  . Breast cancer Mother   . Glaucoma Mother   . Miscarriages / [place] Mother   . Heart attack Father   . Depression Father   . Early death Father   . Heart disease Father   . Tuberculosis Paternal Grandmother   . Prostate cancer Paternal Grandfather   . Depression Brother   . Hypertension Brother   . Colon cancer Neg Hx   . Esophageal cancer Neg Hx   . Rectal cancer Neg Hx   . Stomach cancer Neg Hx     SH:  Married, non smoker  Review of Systems  Gastrointestinal: Positive for constipation.  Skin:       Itching    PHYSICAL EXAMINATION:    BP 130/86   Pulse 64   Temp 97.6 F (36.4 C) (Temporal)   LMP  (LMP Unknown)     General appearance: alert, cooperative and appears stated age Lymph:  no inguinal LAD noted  Pelvic: External genitalia:  no lesions              Urethra:  normal appearing urethra with no masses, tenderness or lesions              Bartholins and Skenes: normal                 Vagina: normal appearing vagina with normal color and discharge, no lesions              Cervix: no lesions              Bimanual Exam:  Uterus:  normal size, contour, position, consistency, mobility, non-tender              Adnexa: no mass, fullness, tenderness  Chaperone was present for exam.  Assessment: Pelvic pain with recent bowel movement changes Uterine fibroids, minimally changed 3.0cm fibroid from 10/2017 PUS at [facility] H/O endometrial polyps with no evidence of this today.  Plan: Pt will return for AEX in one year Information about allergists  given to pt today She is going to consider following up with GI for colonoscopy   ~15 minutes spent with patient >50% of time was in face to face  discussion of above.

## 2019-04-26 ENCOUNTER — Telehealth: Payer: Self-pay | Admitting: Obstetrics & Gynecology

## 2019-04-26 NOTE — Telephone Encounter (Signed)
Patient is calling regarding scheduling a MyChart video visit to discuss getting a breast ultrasound following her mammogram. She will also like to discuss her next steps regarding calcium for osteopenia.

## 2019-04-27 NOTE — Telephone Encounter (Signed)
Left message to call Little Winton, RN at [facility] [phone].   

## 2019-04-28 NOTE — Telephone Encounter (Signed)
Returning a call to Jill . °

## 2019-04-28 NOTE — Telephone Encounter (Signed)
Spoke with patient.   1. Patient request to move AEX to earlier date, last AEX 07/13/18. AEX rescheduled to 08/01/19 at 2:30pm with Dr. [name] Heck.   2. Patient requesting virtual visit to discuss BMD results and additional breast imaging. MMG 03/28/19 normal, has concerns about dense breast along with family Hx of breast cancer. Virtual OV scheduled for 8/20 at 4:30pm with Dr. [name] Heck.   Routing to provider for final review. Patient is agreeable to disposition. Will close encounter.

## 2019-05-03 ENCOUNTER — Ambulatory Visit: Payer: 59 | Admitting: Cardiology

## 2019-05-04 ENCOUNTER — Encounter: Payer: Self-pay | Admitting: Family Medicine

## 2019-05-04 ENCOUNTER — Ambulatory Visit (INDEPENDENT_AMBULATORY_CARE_PROVIDER_SITE_OTHER): Payer: Medicare Other | Admitting: Family Medicine

## 2019-05-04 VITALS — BP 115/60 | HR 50

## 2019-05-04 DIAGNOSIS — R110 Nausea: Secondary | ICD-10-CM | POA: Diagnosis not present

## 2019-05-04 DIAGNOSIS — F329 Major depressive disorder, single episode, unspecified: Secondary | ICD-10-CM

## 2019-05-04 DIAGNOSIS — F32A Depression, unspecified: Secondary | ICD-10-CM

## 2019-05-04 DIAGNOSIS — E222 Syndrome of inappropriate secretion of antidiuretic hormone: Secondary | ICD-10-CM | POA: Diagnosis not present

## 2019-05-04 DIAGNOSIS — F419 Anxiety disorder, unspecified: Secondary | ICD-10-CM

## 2019-05-04 NOTE — Progress Notes (Signed)
Patient: Katrina Benitez MRN: [id number] DOB: [birth year] PCP: Orma Flaming, MD     I connected with [name] on 05/04/19 at 1:05pm by a video enabled telemedicine application and verified that I am speaking with the correct person using two identifiers.  Location patient: Home Location provider: [facility], Office Persons participating in this virtual visit: [name] and Dr. Rogers Blocker   I discussed the limitations of evaluation and management by telemedicine and the availability of in person appointments. The patient expressed understanding and agreed to proceed.   Interactive audio and video telecommunications were attempted between this provider and patient, however failed, due to patient having technical difficulties OR patient did not have access to video capability.  We continued and completed visit with audio only.    Subjective:  Chief Complaint  Patient presents with  . hyponatremia  . Depression  . Anxiety    HPI: The patient is a 73 y.o. female who presents today for follow up. She has not felt well since she was hospitalized with afib and pneumonia. She was seen via telehealth by Dr. Jonni Sanger in July for depression and anxiety. She was started on trintellix and increased her valium to 10mg . wellbutrin was stopped at that time. She has been on trintellix x 10 days. She feels like her anxiety and depression are better today, but she has bad days. She has tried counseling and does not like this and is not a fan of doing this. She will try to start exercising. She is taking the valium daily. No si/hi/ah/vh.   She has been having GI issues recently as well with increased nausea and GI upset. She has diarrhea and abdominal pain. She states it's steady pain in her upper abdomen. It does come and go. Started a few months ago. She has seen dr. Henrene Pastor in the past. Last colonoscopy was in 2013. She is having both upper and lower GI issues. 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 History difficult.   Review of Systems  Constitutional: Negative for chills, fatigue and fever.  Respiratory: Negative for cough, shortness of breath and wheezing.   Cardiovascular: Negative for chest pain, palpitations and leg swelling.  Gastrointestinal: Positive for abdominal pain, diarrhea and nausea. Negative for blood in stool.  Psychiatric/Behavioral: Positive for dysphoric mood. Negative for suicidal ideas. The patient is nervous/anxious.     Allergies Patient is allergic to metronidazole; fentanyl; ciprofloxacin; dilaudid [hydromorphone hcl]; and visipaque [iodixanol].  Past Medical History Patient  has a past medical history of Allergy, Anemia, Anxiety, Arthritis, Cardiac arrhythmia, Depression, Diverticulitis, Diverticulosis, GERD (gastroesophageal reflux disease), Heart murmur, HTN (hypertension), Infertility, female, Neuromuscular disorder ([place]), Peripheral neuropathy, Positive PPD, and SIADH (syndrome of inappropriate ADH production) ([place]).  Surgical History Patient  has a past surgical history that includes Lumbar disc surgery; Lumbar laminectomy; Tonsillectomy; Colonoscopy; Upper gastrointestinal endoscopy; Cervical polypectomy (02/2016); Breast biopsy (Left); Breast excisional biopsy (Left); Colposcopy; Dilation and curettage of uterus; Hysteroscopy; and Cervical biopsy w/ loop electrode excision.  Family History Pateint's family history includes Breast cancer in her mother; Depression in her brother and father; Early death in her father; Glaucoma in her mother; Heart attack in her father; Heart disease in her father; Hypertension in her brother; Miscarriages / [place] in her mother; Prostate cancer in her paternal grandfather; Tuberculosis in her paternal grandmother.  Social History Patient  reports that she has never smoked. She has never used smokeless tobacco. She reports current alcohol  use of about 14.0 standard drinks of  alcohol per week. She reports that she does not use drugs.    Objective: Vitals:   05/04/19 1318  BP: 115/60  Pulse: (!) 50    There is no height or weight on file to calculate BMI.  GAD 7 : Generalized Anxiety Score 05/04/2019 02/18/2019  Nervous, Anxious, on Edge 2 2  Control/stop worrying 1 2  Worry too much - different things 1 0  Trouble relaxing 1 0  Restless 0 0  Easily annoyed or irritable 0 0  Afraid - awful might happen 1 0  Total GAD 7 Score 6 4  Anxiety Difficulty Not difficult at all Somewhat difficult    Depression screen [facility] 2/9 05/04/2019 02/18/2019 11/10/2017  Decreased Interest 1 2 0  Down, Depressed, Hopeless 1 2 0  PHQ - 2 Score 2 4 0  Altered sleeping 1 3 -  Tired, decreased energy 1 1 -  Change in appetite 2 0 -  Feeling bad or failure about yourself  0 0 -  Trouble concentrating 0 0 -  Moving slowly or fidgety/restless 0 0 -  Suicidal thoughts 0 0 -  PHQ-9 Score 6 8 -  Difficult doing work/chores Not difficult at all Somewhat difficult -       Assessment/plan: 1. Nausea Lots of GI issues going on and history hard to get. Hx of stricture in the past and followed by GI. Will send her back there. She is happy with this.  - Ambulatory referral to Gastroenterology  2. SIADH (syndrome of inappropriate ADH production) ([facility]) Repeat bmp monthly. Had failed to do this. Appears to be stable.   3. Anxiety and depression phq9 and gad 7 scores are quite good. Will continue the trintellix at current dose and see her back in one month. She declines counseling. Encouraged exercise. Close f/u.    Return in about 1 month (around 06/04/2019) for anxiety/depression .  Records requested if needed. Time spent with patient: 25 minutes, of which >50% was spent in obtaining information about her symptoms, reviweing her previous labs, evaluations, and treatments, counseling her about her conditions (please see discussed topics above), and developing a plan to further  investigate it; she had a number of questions which I addressed.    Orma Flaming, MD [place]  05/04/2019

## 2019-05-12 ENCOUNTER — Telehealth: Payer: Medicare Other | Admitting: Obstetrics & Gynecology

## 2019-05-12 ENCOUNTER — Telehealth: Payer: Self-pay | Admitting: Obstetrics & Gynecology

## 2019-05-12 ENCOUNTER — Other Ambulatory Visit (INDEPENDENT_AMBULATORY_CARE_PROVIDER_SITE_OTHER): Payer: Medicare Other

## 2019-05-12 DIAGNOSIS — H524 Presbyopia: Secondary | ICD-10-CM | POA: Diagnosis not present

## 2019-05-12 DIAGNOSIS — H2513 Age-related nuclear cataract, bilateral: Secondary | ICD-10-CM | POA: Diagnosis not present

## 2019-05-12 DIAGNOSIS — E222 Syndrome of inappropriate secretion of antidiuretic hormone: Secondary | ICD-10-CM

## 2019-05-12 DIAGNOSIS — H5203 Hypermetropia, bilateral: Secondary | ICD-10-CM | POA: Diagnosis not present

## 2019-05-12 DIAGNOSIS — H52203 Unspecified astigmatism, bilateral: Secondary | ICD-10-CM | POA: Diagnosis not present

## 2019-05-12 LAB — BASIC METABOLIC PANEL
BUN: 21 mg/dL (ref 6–23)
CO2: 25 mEq/L (ref 19–32)
Calcium: 9.4 mg/dL (ref 8.4–10.5)
Chloride: 94 mEq/L — ABNORMAL LOW (ref 96–112)
Creatinine, Ser: 1.01 mg/dL (ref 0.40–1.20)
GFR: 53.72 mL/min — ABNORMAL LOW (ref >60.00)
Glucose, Bld: 100 mg/dL — ABNORMAL HIGH (ref 70–99)
Potassium: 4.5 mEq/L (ref 3.5–5.1)
Sodium: 128 mEq/L — ABNORMAL LOW (ref 135–145)

## 2019-05-12 NOTE — Telephone Encounter (Signed)
Patient rescheduled her e-visit this afternoon to 05/19/19 at 4:30 pm.

## 2019-05-19 ENCOUNTER — Other Ambulatory Visit: Payer: Self-pay | Admitting: Family Medicine

## 2019-05-19 ENCOUNTER — Telehealth (INDEPENDENT_AMBULATORY_CARE_PROVIDER_SITE_OTHER): Payer: Medicare Other | Admitting: Obstetrics & Gynecology

## 2019-05-19 ENCOUNTER — Telehealth: Payer: Self-pay | Admitting: Internal Medicine

## 2019-05-19 DIAGNOSIS — Z1501 Genetic susceptibility to malignant neoplasm of breast: Secondary | ICD-10-CM

## 2019-05-19 DIAGNOSIS — M8580 Other specified disorders of bone density and structure, unspecified site: Secondary | ICD-10-CM

## 2019-05-19 DIAGNOSIS — Z803 Family history of malignant neoplasm of breast: Secondary | ICD-10-CM

## 2019-05-19 NOTE — Progress Notes (Signed)
Virtual Visit via Video Note  I connected with [name] on 05/19/19 at  4:30 PM EDT by a video enabled telemedicine application and verified that I am speaking with the correct person using two identifiers.  Location: Patient: home Provider: office  History of Present Illness: 73 yo G0 MWF with concerns about person risk for breast cancer.  Mother diagnosed at age 53 with breast cancer.  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  3D MMG was performed 03/28/2019.  Weakness of breast ultrasound reviewed.  Tyrer cusick model for breast cancer risk done with pt today.  Lifetime risk is 11.6%.  With this intermediate risk, limited breast MRI is an option for her.  She is very interested in proceeding with this.   Has BMD question related to recent calcium testing.  Calcium was abnormal four months ago but now normalized with two additional tests.  Aware of osteopenia on BMD.  No treatment needed at this time.  Observations/Objective: WNWD WF, NAD  Assessment and Plan: Family hx of breast cancer with personal lifetime risk for breast cancer now 11.6%  Will proceed with scheduling limited breast MRI for this pt.  Follow Up Instructions: She will be contacted for scheduling from [place] once orders are placed.     I discussed the assessment and treatment plan with the patient. The patient was provided an opportunity to ask questions and all were answered. The patient agreed with the plan and demonstrated an understanding of the instructions.   The patient was advised to call back or seek an in-person evaluation if the symptoms worsen or if the condition fails to improve as anticipated.  I provided 15 minutes of non-face-to-face time during this encounter.   Megan Salon, MD

## 2019-05-19 NOTE — Telephone Encounter (Signed)
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  She needs to be seen in person as she has not been seen in 3 years.  Thanks

## 2019-05-19 NOTE — Telephone Encounter (Signed)
Pt has been having nausea and changes in her bowel habits, states she was referred back by her PCP. Pt requesting sooner appt than 9/28. Offered pt appt on 9/14. She would like a virtual appt to discuss if she needs to have a colonoscopy. Let pt know I would check with Dr. Henrene Pastor and see if he thought she needed to be seen in person due to her symptoms. Please advise.

## 2019-05-19 NOTE — Telephone Encounter (Signed)
Pt's husband called and requested a sooner appt--he is an MD and stated that his wife is experiencing nausea and extreme abdominal pain.  Please call back to discuss.

## 2019-05-19 NOTE — Telephone Encounter (Signed)
Left message for pt to call back.  Pt scheduled to see Dr. Henrene Pastor 05/25/19@12noon , pt aware of appt.

## 2019-05-22 ENCOUNTER — Encounter: Payer: Self-pay | Admitting: Obstetrics & Gynecology

## 2019-05-23 ENCOUNTER — Telehealth: Payer: Self-pay | Admitting: *Deleted

## 2019-05-23 DIAGNOSIS — Z803 Family history of malignant neoplasm of breast: Secondary | ICD-10-CM

## 2019-05-23 DIAGNOSIS — Z1239 Encounter for other screening for malignant neoplasm of breast: Secondary | ICD-10-CM

## 2019-05-23 NOTE — Telephone Encounter (Signed)
-----   Message from Megan Salon, MD sent at 05/22/2019 11:34 PM EDT ----- Regarding: limited/abbreviated breast MRI Katrina Benitez, This pt has a lifetime risk of breast cancer of 11.6%.  She would like to have the limited/abbreviated breast MRI scheduled.  I don't know if you've scheduled any of these.  Please let me know how I can help with this.  Thanks.  Vinnie Level

## 2019-05-23 NOTE — Telephone Encounter (Signed)
Order placed for Abbreviated breast MRI, [id number].   Dx: Family Hx breast cancer, screening for breast cancer  Call placed to patient. Left detailed message, ok per dpr. Advised order for abbreviated breast MRI placed to [facility], they will contact you directly to schedule. Return call to Alix, RN at [facility] if you have any additional questions or need any further assistance.   Routing to provider for final review. Patient is agreeable to disposition. Will close encounter.  Cc: Lerry Liner, Magdalene Patricia

## 2019-05-25 ENCOUNTER — Other Ambulatory Visit: Payer: Self-pay

## 2019-05-25 ENCOUNTER — Encounter: Payer: Self-pay | Admitting: Internal Medicine

## 2019-05-25 ENCOUNTER — Ambulatory Visit (INDEPENDENT_AMBULATORY_CARE_PROVIDER_SITE_OTHER): Payer: Medicare Other | Admitting: Internal Medicine

## 2019-05-25 VITALS — BP 112/72 | HR 66 | Temp 97.9°F | Ht 66.0 in

## 2019-05-25 DIAGNOSIS — R109 Unspecified abdominal pain: Secondary | ICD-10-CM | POA: Diagnosis not present

## 2019-05-25 DIAGNOSIS — K219 Gastro-esophageal reflux disease without esophagitis: Secondary | ICD-10-CM

## 2019-05-25 DIAGNOSIS — R194 Change in bowel habit: Secondary | ICD-10-CM | POA: Diagnosis not present

## 2019-05-25 DIAGNOSIS — R110 Nausea: Secondary | ICD-10-CM

## 2019-05-25 MED ORDER — OMEPRAZOLE 40 MG PO CPDR: 40.0000 mg | DELAYED_RELEASE_CAPSULE | Freq: Every day | ORAL | 3 refills | Status: DC

## 2019-05-25 MED ORDER — NA SULFATE-K SULFATE-MG SULF 17.5-3.13-1.6 GM/177ML PO SOLN: 1.0000 | Freq: Once | ORAL | 0 refills | Status: AC

## 2019-05-25 NOTE — Progress Notes (Signed)
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  See that dictation for details.  She was last evaluated in this office June 02, 2016.  The issues at that time were a mild episode of incontinence after taking a laxative, asymptomatic GERD on PPI post dilation, mild elevation of alkaline phosphatase with normal hepatobiliary ultrasound, request for hepatitis C testing, and a myriad of non-GI complaints.  AMA was normal.  Hepatitis C was negative.  Repeat liver tests were normal.  She was encouraged to follow-up in 1 year but did not.  She begins by describing the episode of dermatitis 1 year ago for which she was treated with subsequent issues with constipation for which she used milk of magnesia.  More recently she describes that her bowel habits are different.  Formed.  Not diarrhea.  The shape and consistency of "Junior Mints".  She is also had problems with intermittent nausea for which she is used Zofran.  She did have what sounds like an acute gastroenteritis with nausea vomiting and diarrhea.  She was hospitalized in May with pneumonia and atrial fibrillation.  No longer on anticoagulation therapy.  Feeling better.  Testing for COVID was negative.  Her last colonoscopy was performed in 2013 with Dr. Emogene Morgan in [place].  She was found to have sigmoid diverticulosis and hemorrhoids.  No polyps.  Her preparation was excellent.  Review of most recent blood work from May 12, 2019 shows hyponatremia with a sodium of 128.  She tells me that she is being treated for [facility].  Hemoglobin from April 2020 was 11.6.  Her last upper endoscopy January 2017 with esophageal stricture which was dilated.  No recurrent dysphasia.  No longer on PPI.  REVIEW OF SYSTEMS:  All non-GI ROS negative  unless otherwise stated in the HPI except for allergies, arthritis,  Past Medical History:  Diagnosis Date  . Allergy   . Anemia   . Anxiety   . Arthritis    djd  . Cardiac arrhythmia   . Depression   . Diverticulitis   . Diverticulosis   . GERD (gastroesophageal reflux disease)   . Heart murmur   . HTN (hypertension)   . Infertility, female   . Neuromuscular disorder ([place])    idiopathic peripheral  neuropathy  . Peripheral neuropathy   . Positive PPD   . SIADH (syndrome of inappropriate ADH production) ([place])     Past Surgical History:  Procedure Laterality Date  . BREAST BIOPSY Left   . BREAST EXCISIONAL BIOPSY Left    benign  . CERVICAL BIOPSY  W/ LOOP ELECTRODE EXCISION    . CERVICAL POLYPECTOMY  02/2016  . COLONOSCOPY    . COLPOSCOPY    . DILATION AND CURETTAGE OF UTERUS    . HYSTEROSCOPY    . LUMBAR DISC SURGERY    . LUMBAR LAMINECTOMY    . TONSILLECTOMY    . UPPER GASTROINTESTINAL ENDOSCOPY      Social History [name]  reports that she has never smoked. She has never used smokeless tobacco. She reports current alcohol use of about 14.0 standard drinks of alcohol per week. She reports that she does not use drugs.  family history includes Breast cancer in her mother; Depression in her brother and father; Early death in her father; Glaucoma in  her mother; Heart attack in her father; Heart disease in her father; Hypertension in her brother; Miscarriages / [place] in her mother; Prostate cancer in her paternal grandfather; Tuberculosis in her paternal grandmother.  Allergies  Allergen Reactions  . Metronidazole Hives  . Fentanyl Itching  . Ciprofloxacin Hives  . Dilaudid [Hydromorphone Hcl] Hives  . Visipaque [Iodixanol] Swelling    Throat swelling       PHYSICAL EXAMINATION: Vital signs: BP 112/72 (BP Location: Left Arm, Patient Position: Sitting, Cuff Size: Normal)   Pulse 66   Temp 97.9 F (36.6 C) (Other (Comment))   Ht '5\' 6"'  (1.676 m)    LMP  (LMP Unknown)   BMI 32.28 kg/m   Constitutional: generally well-appearing, no acute distress Psychiatric: alert and oriented x3, cooperative Eyes: extraocular movements intact, anicteric, conjunctiva pink Mouth: oral pharynx moist, no lesions Neck: supple no lymphadenopathy Cardiovascular: heart regular rate and rhythm, no murmur Lungs: clear to auscultation bilaterally Abdomen: soft, nontender, nondistended, no obvious ascites, no peritoneal signs, normal bowel sounds, no organomegaly Rectal: Deferred until colonoscopy Extremities: no clubbing or cyanosis.  1+ lower extremity edema bilaterally Skin: no lesions on visible extremities Neuro: No focal deficits.  Cranial nerves intact  ASSESSMENT:  1.  Change in bowel habits.  Possibly medication related.  Possibly related to recent illnesses with exposure to various medications including antibiotics.  Last colonoscopy 7 years ago 2.  GERD.  No actual GERD symptoms off PPI though nausea may be the equivalent. 3.  History of esophageal stricture.  Dilated.  No recurrent dysphasia 4.  Abdominal discomfort.  Etiology unclear.  Previous ultrasound negative for gallstones.  Question GERD equivalent.  Rule out ulcer. 4.  Multiple medical problems   PLAN:  1.  Colonoscopy to evaluate change in bowel habits and abdominal discomfort. 2.  Upper endoscopy to evaluate abdominal discomfort and problems with nausea. 3.  Prescribe omeprazole 40 mg daily.  This to see if her abdominal complaints and/or nausea improved 4.  Continue Zofran as needed for nausea 5.  Would recommend Metamucil 1 or 2 tablespoons daily to improve bowel consistency.  Add to 14 ounces of juice or water. 6.  Additional recommendations pending the outcome of the above investigations and response to therapies 7.  Ongoing general medical care with PCP including attention to hyponatremia.  Her sodium needs to be at or above 132 before proceeding with colonoscopy prep.

## 2019-05-25 NOTE — Patient Instructions (Signed)
  We have sent the following medications to your pharmacy for you to pick up at your convenience:  Omeprazole   You have been scheduled for an endoscopy and colonoscopy. Please follow the written instructions given to you at your visit today. Please pick up your prep supplies at the pharmacy within the next 1-3 days. If you use inhalers (even only as needed), please bring them with you on the day of your procedure.  

## 2019-05-27 ENCOUNTER — Other Ambulatory Visit: Payer: Self-pay | Admitting: Family Medicine

## 2019-05-31 ENCOUNTER — Telehealth: Payer: Self-pay | Admitting: Family Medicine

## 2019-05-31 ENCOUNTER — Telehealth: Payer: Self-pay

## 2019-05-31 NOTE — Telephone Encounter (Signed)
Patient notified of Dr. Blanch Media orders.  She will reach out to Dr. Shelby Mattocks office to get labs checked on Friday or early Monday

## 2019-05-31 NOTE — Telephone Encounter (Signed)
See note  Copied from [place] [id number]. Topic: General - Other >> May 31, 2019  2:41 PM Rayann Heman wrote: Reason for CRM: pt called and stated that she would like a call back from the nurse regarding sodium level. Pt states that she can not see the GI doctor till it is up. Please advise

## 2019-05-31 NOTE — Telephone Encounter (Signed)
-----   Message from Irene Shipper, MD sent at 05/27/2019  1:40 PM EDT ----- Regarding: Sodium level. Darian Ace,Please contact the patient, Dr. Clydene Laming, and let her know that her sodium needs to be at or greater than 132 before proceeding with colonoscopy prep.  Currently she is scheduled for colonoscopy and upper endoscopy on September 15.  She should work with her doctor (Dr. Rogers Blocker), whom I have copied on this correspondence.  Thanks Dr. Henrene Pastor

## 2019-06-01 ENCOUNTER — Telehealth: Payer: Self-pay | Admitting: Internal Medicine

## 2019-06-01 ENCOUNTER — Other Ambulatory Visit: Payer: Self-pay

## 2019-06-01 DIAGNOSIS — E871 Hypo-osmolality and hyponatremia: Secondary | ICD-10-CM

## 2019-06-01 NOTE — Telephone Encounter (Signed)
Please first see if she is taking her prescribed salt tablets from last doctor. If not, she needs to start taking salt tablets. I would take either once/twice a day. im not sure what other doctor had them written for. Also, would limit water intake. The only drug that could be contributing is the trintellix, but if controlling her anxiety/depression I think it's important we leave this on her medication list.   Let's repeat bmp next week after she starts taking salt tablets. Please find out what other doctor had them written for and if she was supposed to take daily... I think she stopped on her won.   Dr. Rogers Blocker

## 2019-06-01 NOTE — Telephone Encounter (Signed)
Spoke with pt and let her know that there is documentation from Dr. Rogers Blocker and she should hear something back from them soon. Pt concerned because she cannot have colonoscopy if sodium level is not 132. Pt instructed to contact our office tomorrow if she has not heard from Dr. Shelby Mattocks office.

## 2019-06-01 NOTE — Telephone Encounter (Signed)
Spoke to patient and she states that she has been taking her salt tablets qd as prescribed, just recently increased to BID x 2 days ago.  She also reports that she stopped taking her Trintellix due to the fact that she felt it caused extreme nausea while taking.  She reports that she stopped x 1 week ago and nausea has resolved.  She would really prefer to have her BMP done prior to colonoscopy which is scheduled for 9/15.  Also wanted to come in for her appt with Dr. Rogers Blocker after having her colonoscopy done.  Appt r/s from 9/14 to 9/18.

## 2019-06-02 ENCOUNTER — Other Ambulatory Visit (INDEPENDENT_AMBULATORY_CARE_PROVIDER_SITE_OTHER): Payer: Medicare Other

## 2019-06-02 DIAGNOSIS — E871 Hypo-osmolality and hyponatremia: Secondary | ICD-10-CM

## 2019-06-02 LAB — BASIC METABOLIC PANEL
BUN: 15 mg/dL (ref 6–23)
CO2: 29 mEq/L (ref 19–32)
Calcium: 9.1 mg/dL (ref 8.4–10.5)
Chloride: 98 mEq/L (ref 96–112)
Creatinine, Ser: 0.83 mg/dL (ref 0.40–1.20)
GFR: 67.37 mL/min (ref >60.00)
Glucose, Bld: 85 mg/dL (ref 70–99)
Potassium: 5.1 mEq/L (ref 3.5–5.1)
Sodium: 133 mEq/L — ABNORMAL LOW (ref 135–145)

## 2019-06-06 ENCOUNTER — Telehealth: Payer: Self-pay | Admitting: Internal Medicine

## 2019-06-06 ENCOUNTER — Ambulatory Visit: Payer: 59 | Admitting: Family Medicine

## 2019-06-06 NOTE — Telephone Encounter (Signed)
Do you now or have you had a fever in the last 14 days?    No ° °  ° °Do you have any respiratory symptoms of shortness of breath or cough now or in the last 14 days?   No ° °  ° °Do you have any family members or close contacts with diagnosed or suspected Covid-19 in the past 14 days?   No  ° °  ° °Have you been tested for Covid-19 and found to be positive?   No ° °  ° °Pt made aware to check in on theth floor and that care partner may wait in the car or come up to the lobby during the procedure but will need to provide their own mask. °

## 2019-06-07 ENCOUNTER — Ambulatory Visit (AMBULATORY_SURGERY_CENTER): Payer: Medicare Other | Admitting: Internal Medicine

## 2019-06-07 ENCOUNTER — Encounter: Payer: Self-pay | Admitting: Internal Medicine

## 2019-06-07 ENCOUNTER — Other Ambulatory Visit: Payer: Self-pay

## 2019-06-07 VITALS — BP 145/65 | HR 64 | Temp 98.0°F | Resp 14 | Ht 66.0 in | Wt 185.0 lb

## 2019-06-07 DIAGNOSIS — K5730 Diverticulosis of large intestine without perforation or abscess without bleeding: Secondary | ICD-10-CM | POA: Diagnosis not present

## 2019-06-07 DIAGNOSIS — K2970 Gastritis, unspecified, without bleeding: Secondary | ICD-10-CM | POA: Diagnosis not present

## 2019-06-07 DIAGNOSIS — K219 Gastro-esophageal reflux disease without esophagitis: Secondary | ICD-10-CM | POA: Diagnosis not present

## 2019-06-07 DIAGNOSIS — K228 Other specified diseases of esophagus: Secondary | ICD-10-CM

## 2019-06-07 DIAGNOSIS — Z1211 Encounter for screening for malignant neoplasm of colon: Secondary | ICD-10-CM

## 2019-06-07 DIAGNOSIS — R110 Nausea: Secondary | ICD-10-CM

## 2019-06-07 DIAGNOSIS — D122 Benign neoplasm of ascending colon: Secondary | ICD-10-CM

## 2019-06-07 DIAGNOSIS — K2990 Gastroduodenitis, unspecified, without bleeding: Secondary | ICD-10-CM

## 2019-06-07 DIAGNOSIS — K648 Other hemorrhoids: Secondary | ICD-10-CM

## 2019-06-07 DIAGNOSIS — R194 Change in bowel habit: Secondary | ICD-10-CM | POA: Diagnosis not present

## 2019-06-07 DIAGNOSIS — K449 Diaphragmatic hernia without obstruction or gangrene: Secondary | ICD-10-CM | POA: Diagnosis not present

## 2019-06-07 DIAGNOSIS — R109 Unspecified abdominal pain: Secondary | ICD-10-CM

## 2019-06-07 MED ORDER — SODIUM CHLORIDE 0.9 % IV SOLN: 500.0000 mL | Freq: Once | INTRAVENOUS | Status: DC

## 2019-06-07 NOTE — Progress Notes (Signed)
A and O x3. Report to RN. Tolerated MAC anesthesia well.Teeth unchanged after procedure.

## 2019-06-07 NOTE — Patient Instructions (Signed)
Thank you for letting us take care of your healthcare needs today. Please see handouts given to you on Polyps, Diverticulosis, Hemorrhoids, Gastritis and Hiatal Hernia. Dr. Henrene Pastor wants you to start Metamucil 2 Tablespoons daily in 12-14 oz of water or juice. Continue Omeprazole and Zofran as prescribed.      YOU HAD AN ENDOSCOPIC PROCEDURE TODAY AT [facility]:   Refer to the procedure report that was given to you for any specific questions about what was found during the examination.  If the procedure report does not answer your questions, please call your gastroenterologist to clarify.  If you requested that your care partner not be given the details of your procedure findings, then the procedure report has been included in a sealed envelope for you to review at your convenience later.  YOU SHOULD EXPECT: Some feelings of bloating in the abdomen. Passage of more gas than usual.  Walking can help get rid of the air that was put into your GI tract during the procedure and reduce the bloating. If you had a lower endoscopy (such as a colonoscopy or flexible sigmoidoscopy) you may notice spotting of blood in your stool or on the toilet paper. If you underwent a bowel prep for your procedure, you may not have a normal bowel movement for a few days.  Please Note:  You might notice some irritation and congestion in your nose or some drainage.  This is from the oxygen used during your procedure.  There is no need for concern and it should clear up in a day or so.  SYMPTOMS TO REPORT IMMEDIATELY:   Following lower endoscopy (colonoscopy or flexible sigmoidoscopy):  Excessive amounts of blood in the stool  Significant tenderness or worsening of abdominal pains  Swelling of the abdomen that is new, acute  Fever of 100F or higher   Following upper endoscopy (EGD)  Vomiting of blood or coffee ground material  New chest pain or pain under the shoulder blades  Painful or persistently  difficult swallowing  New shortness of breath  Fever of 100F or higher  Black, tarry-looking stools  For urgent or emergent issues, a gastroenterologist can be reached at any hour by calling [phone].   DIET:  We do recommend a small meal at first, but then you may proceed to your regular diet.  Drink plenty of fluids but you should avoid alcoholic beverages for 24 hours.  ACTIVITY:  You should plan to take it easy for the rest of today and you should NOT DRIVE or use heavy machinery until tomorrow (because of the sedation medicines used during the test).    FOLLOW UP: Our staff will call the number listed on your records 48-72 hours following your procedure to check on you and address any questions or concerns that you may have regarding the information given to you following your procedure. If we do not reach you, we will leave a message.  We will attempt to reach you two times.  During this call, we will ask if you have developed any symptoms of COVID 19. If you develop any symptoms (ie: fever, flu-like symptoms, shortness of breath, cough etc.) before then, please call [phone].  If you test positive for Covid 19 in the 2 weeks post procedure, please call and report this information to [place].    If any biopsies were taken you will be contacted by phone or by letter within the next 1-3 weeks.  Please call us at [phone]  if you have not heard about the biopsies in 3 weeks.    SIGNATURES/CONFIDENTIALITY: You and/or your care partner have signed paperwork which will be entered into your electronic medical record.  These signatures attest to the fact that that the information above on your After Visit Summary has been reviewed and is understood.  Full responsibility of the confidentiality of this discharge information lies with you and/or your care-partner.

## 2019-06-07 NOTE — Op Note (Signed)
[facility] Patient Name: Katrina Benitez Procedure Date: 06/07/2019 3:05 PM MRN: [id number] Endoscopist: Docia Chuck. Henrene Pastor , MD Age: 73 Referring MD:  Date of Birth: 1946-08-15 Gender: Female Account #: [id number] Procedure:                Colonoscopy with cold snare polypectomy x 2 Indications:              Screening for colorectal malignant neoplasm.                            Previous examination elsewhere in 2013. Incidental                            complaints of change in bowel habits and abdominal                            discomfort Medicines:                Monitored Anesthesia Care Procedure:                Pre-Anesthesia Assessment:                           - Prior to the procedure, a History and Physical                            was performed, and patient medications and                            allergies were reviewed. The patient's tolerance of                            previous anesthesia was also reviewed. The risks                            and benefits of the procedure and the sedation                            options and risks were discussed with the patient.                            All questions were answered, and informed consent                            was obtained. Prior Anticoagulants: The patient has                            taken no previous anticoagulant or antiplatelet                            agents. ASA Grade Assessment: II - A patient with                            mild systemic disease. After reviewing the risks  and benefits, the patient was deemed in                            satisfactory condition to undergo the procedure.                           After obtaining informed consent, the colonoscope                            was passed under direct vision. Throughout the                            procedure, the patient's blood pressure, pulse, and                            oxygen saturations were  monitored continuously. The                            Colonoscope was introduced through the anus and                            advanced to the the cecum, identified by                            appendiceal orifice and ileocecal valve. The                            terminal ileum, ileocecal valve, appendiceal                            orifice, and rectum were photographed. The quality                            of the bowel preparation was excellent. The                            colonoscopy was performed without difficulty. The                            patient tolerated the procedure well. The bowel                            preparation used was SUPREP via split dose                            instruction. Scope In: 3:21:18 PM Scope Out: 3:37:05 PM Scope Withdrawal Time: 0 hours 9 minutes 11 seconds  Total Procedure Duration: 0 hours 15 minutes 47 seconds  Findings:                 The terminal ileum appeared normal.                           Two polyps were found in the ascending colon. The  polyps were 1 to 2 mm in size. These polyps were                            removed with a cold snare. Resection and retrieval                            were complete.                           Many small and large-mouthed diverticula were found                            in the sigmoid colon.                           Internal hemorrhoids were found during retroflexion.                           The exam was otherwise without abnormality on                            direct and retroflexion views. Complications:            No immediate complications. Estimated blood loss:                            None. Estimated Blood Loss:     Estimated blood loss: none. Impression:               - The examined portion of the ileum was normal.                           - Two 1 to 2 mm polyps in the ascending colon,                            removed with a cold snare.  Resected and retrieved.                           - Diverticulosis in the sigmoid colon.                           - Internal hemorrhoids.                           - The examination was otherwise normal on direct                            and retroflexion views. Recommendation:           - Repeat colonoscopy is not recommended for                            surveillance.                           - Patient has a contact number available for  emergencies. The signs and symptoms of potential                            delayed complications were discussed with the                            patient. Return to normal activities tomorrow.                            Written discharge instructions were provided to the                            patient.                           - Resume previous diet.                           - Continue present medications.                           - Await pathology results.                           METAMUCIL 2 TABLESPOONS DAILY IN 12 to 14 ounces of                            water or juice. This will improve your bowel                            consistency Swan Zayed N. Henrene Pastor, MD 06/07/2019 3:46:52 PM This report has been signed electronically.

## 2019-06-07 NOTE — Op Note (Signed)
[facility] Patient Name: Katrina Benitez Procedure Date: 06/07/2019 3:05 PM MRN: [id number] Endoscopist: Docia Chuck. Henrene Pastor , MD Age: 73 Referring MD:  Date of Birth: 1946-05-10 Gender: Female Account #: [id number] Procedure:                Upper GI endoscopy with biopsies Indications:              Nausea; abdominal discomfort Medicines:                Monitored Anesthesia Care Procedure:                Pre-Anesthesia Assessment:                           - Prior to the procedure, a History and Physical                            was performed, and patient medications and                            allergies were reviewed. The patient's tolerance of                            previous anesthesia was also reviewed. The risks                            and benefits of the procedure and the sedation                            options and risks were discussed with the patient.                            All questions were answered, and informed consent                            was obtained. Prior Anticoagulants: The patient has                            taken no previous anticoagulant or antiplatelet                            agents. ASA Grade Assessment: II - A patient with                            mild systemic disease. After reviewing the risks                            and benefits, the patient was deemed in                            satisfactory condition to undergo the procedure.                           After obtaining informed consent, the endoscope was  passed under direct vision. Throughout the                            procedure, the patient's blood pressure, pulse, and                            oxygen saturations were monitored continuously. The                            Endoscope was introduced through the mouth, and                            advanced to the second part of duodenum. The upper                            GI endoscopy was  accomplished without difficulty.                            The patient tolerated the procedure well. Scope In: Scope Out: Findings:                 The esophagus revealed a large caliber ring but was                            otherwise normal. No active inflammation.                           The stomach revealed a sliding hiatal hernia and                            patchy erythema in the antrum consistent with                            nonspecific gastritis. Biopsies were taken with a                            cold forceps for Helicobacter pylori testing using                            CLOtest.                           The examined duodenum was normal.                           The cardia and gastric fundus were normal on                            retroflexion. Complications:            No immediate complications. Estimated Blood Loss:     Estimated blood loss: none. Impression:               1. Distal esophageal ring. Asymptomatic  2. Gastritis                           3. Hiatal hernia, but otherwise normal EGD. Recommendation:           1. Continue recently prescribed omeprazole 40 mg                            daily                           2. Continue Zofran as needed for nausea                           3. Follow-up CLO biopsy and treat if positive for                            Helicobacter pylori                           4. Routine office follow-up with Dr. Henrene Pastor in 4 to                            6 weeks. Docia Chuck. Henrene Pastor, MD 06/07/2019 3:55:55 PM This report has been signed electronically.

## 2019-06-08 ENCOUNTER — Telehealth: Payer: Self-pay | Admitting: Physical Therapy

## 2019-06-08 LAB — HELICOBACTER PYLORI SCREEN-BIOPSY: UREASE: NEGATIVE

## 2019-06-08 NOTE — Telephone Encounter (Signed)
Copied from [place] [id number]. Topic: General - Other >> Jun 08, 2019  4:39 PM Yvette Rack wrote: Reason for CRM: Pt requests that Delsa Sale return her call tomorrow.

## 2019-06-09 ENCOUNTER — Telehealth: Payer: Self-pay | Admitting: *Deleted

## 2019-06-09 NOTE — Telephone Encounter (Signed)
  Follow up Call-  Call back number 06/07/2019  Post procedure Call Back phone  # [phone]  Permission to leave phone message Yes  Some recent data might be hidden     Patient questions:  Do you have a fever, pain , or abdominal swelling? No. Pain Score  0 *  Have you tolerated food without any problems? Yes.    Have you been able to return to your normal activities? Yes.    Do you have any questions about your discharge instructions: Diet   No. Medications  No. Follow up visit  No.  Do you have questions or concerns about your Care? No.  Actions: * If pain score is 4 or above: No action needed, pain <4.  1. Have you developed a fever since your procedure? no  2.   Have you had an respiratory symptoms (SOB or cough) since your procedure? no  3.   Have you tested positive for COVID 19 since your procedure no  4.   Have you had any family members/close contacts diagnosed with the COVID 19 since your procedure? no   If yes to any of these questions please route to Joylene John, RN and Alphonsa Gin, Therapist, sports.

## 2019-06-09 NOTE — Telephone Encounter (Signed)
Spoke with patient and rescheduled 9/18 follow up appointment w/Dr. Rogers Blocker to 9/30.

## 2019-06-10 ENCOUNTER — Ambulatory Visit: Payer: 59 | Admitting: Family Medicine

## 2019-06-13 ENCOUNTER — Other Ambulatory Visit: Payer: Self-pay | Admitting: Family Medicine

## 2019-06-14 ENCOUNTER — Encounter: Payer: Self-pay | Admitting: Internal Medicine

## 2019-06-20 ENCOUNTER — Ambulatory Visit: Payer: 59 | Admitting: Internal Medicine

## 2019-06-22 ENCOUNTER — Ambulatory Visit: Payer: Medicare Other | Admitting: Family Medicine

## 2019-06-29 ENCOUNTER — Ambulatory Visit (INDEPENDENT_AMBULATORY_CARE_PROVIDER_SITE_OTHER): Payer: Medicare Other | Admitting: Family Medicine

## 2019-06-29 ENCOUNTER — Encounter: Payer: Self-pay | Admitting: Family Medicine

## 2019-06-29 ENCOUNTER — Other Ambulatory Visit: Payer: Self-pay

## 2019-06-29 VITALS — BP 150/82 | HR 88 | Temp 98.3°F | Ht 66.0 in | Wt 185.0 lb

## 2019-06-29 DIAGNOSIS — Z23 Encounter for immunization: Secondary | ICD-10-CM

## 2019-06-29 DIAGNOSIS — F339 Major depressive disorder, recurrent, unspecified: Secondary | ICD-10-CM | POA: Diagnosis not present

## 2019-06-29 DIAGNOSIS — J189 Pneumonia, unspecified organism: Secondary | ICD-10-CM

## 2019-06-29 DIAGNOSIS — E559 Vitamin D deficiency, unspecified: Secondary | ICD-10-CM

## 2019-06-29 DIAGNOSIS — F419 Anxiety disorder, unspecified: Secondary | ICD-10-CM | POA: Diagnosis not present

## 2019-06-29 DIAGNOSIS — E222 Syndrome of inappropriate secretion of antidiuretic hormone: Secondary | ICD-10-CM | POA: Diagnosis not present

## 2019-06-29 DIAGNOSIS — E782 Mixed hyperlipidemia: Secondary | ICD-10-CM | POA: Diagnosis not present

## 2019-06-29 DIAGNOSIS — I10 Essential (primary) hypertension: Secondary | ICD-10-CM

## 2019-06-29 DIAGNOSIS — F5101 Primary insomnia: Secondary | ICD-10-CM

## 2019-06-29 LAB — CBC WITH DIFFERENTIAL/PLATELET
Basophils Absolute: 0.1 10*3/uL (ref 0.0–0.1)
Basophils Relative: 1 % (ref 0.0–3.0)
Eosinophils Absolute: 0.4 10*3/uL (ref 0.0–0.7)
Eosinophils Relative: 7.3 % — ABNORMAL HIGH (ref 0.0–5.0)
HCT: 36.2 % (ref 36.0–46.0)
Hemoglobin: 12.3 g/dL (ref 12.0–15.0)
Lymphocytes Relative: 29.5 % (ref 12.0–46.0)
Lymphs Abs: 1.5 10*3/uL (ref 0.7–4.0)
MCHC: 34 g/dL (ref 30.0–36.0)
MCV: 92.5 fl (ref 78.0–100.0)
Monocytes Absolute: 0.5 10*3/uL (ref 0.1–1.0)
Monocytes Relative: 10.1 % (ref 3.0–12.0)
Neutro Abs: 2.7 10*3/uL (ref 1.4–7.7)
Neutrophils Relative %: 52.1 % (ref 43.0–77.0)
Platelets: 279 10*3/uL (ref 150.0–400.0)
RBC: 3.92 Mil/uL (ref 3.87–5.11)
RDW: 14.7 % (ref 11.5–15.5)
WBC: 5.2 10*3/uL (ref 4.0–10.5)

## 2019-06-29 LAB — MICROALBUMIN / CREATININE URINE RATIO
Creatinine,U: 48.4 mg/dL
Microalb Creat Ratio: 1.4 mg/g (ref 0.0–30.0)
Microalb, Ur: <0.7 mg/dL (ref 0.0–1.9)

## 2019-06-29 LAB — COMPREHENSIVE METABOLIC PANEL
ALT: 18 U/L (ref 0–35)
AST: 20 U/L (ref 0–37)
Albumin: 4.3 g/dL (ref 3.5–5.2)
Alkaline Phosphatase: 118 U/L — ABNORMAL HIGH (ref 39–117)
BUN: 22 mg/dL (ref 6–23)
CO2: 30 mEq/L (ref 19–32)
Calcium: 9.6 mg/dL (ref 8.4–10.5)
Chloride: 100 mEq/L (ref 96–112)
Creatinine, Ser: 0.9 mg/dL (ref 0.40–1.20)
GFR: 61.34 mL/min (ref >60.00)
Glucose, Bld: 92 mg/dL (ref 70–99)
Potassium: 5.5 mEq/L — ABNORMAL HIGH (ref 3.5–5.1)
Sodium: 137 mEq/L (ref 135–145)
Total Bilirubin: 0.3 mg/dL (ref 0.2–1.2)
Total Protein: 6.8 g/dL (ref 6.0–8.3)

## 2019-06-29 LAB — LIPID PANEL
Cholesterol: 237 mg/dL — ABNORMAL HIGH (ref 0–200)
HDL: 96.5 mg/dL (ref >39.00)
LDL Cholesterol: 121 mg/dL — ABNORMAL HIGH (ref 0–99)
NonHDL: 140.99
Total CHOL/HDL Ratio: 2
Triglycerides: 100 mg/dL (ref 0.0–149.0)
VLDL: 20 mg/dL (ref 0.0–40.0)

## 2019-06-29 LAB — SARS-COV-2 IGG: SARS-COV-2 IgG: 0.02

## 2019-06-29 LAB — VITAMIN D 25 HYDROXY (VIT D DEFICIENCY, FRACTURES): VITD: 36.24 ng/mL (ref 30.00–100.00)

## 2019-06-29 MED ORDER — BETAMETHASONE DIPROPIONATE AUG 0.05 % EX CREA: TOPICAL_CREAM | Freq: Two times a day (BID) | CUTANEOUS | 1 refills | Status: DC

## 2019-06-29 MED ORDER — TRIAMCINOLONE ACETONIDE 0.1 % EX CREA: TOPICAL_CREAM | Freq: Two times a day (BID) | CUTANEOUS | 0 refills | Status: DC

## 2019-06-29 NOTE — Progress Notes (Signed)
Patient: Katrina Benitez MRN: [id number] DOB: [birth year] PCP: Orma Flaming, MD     Subjective:  Chief Complaint  Patient presents with  . Hypertension  . Hyperlipidemia  . Depression  . Anxiety  . Insomnia    HPI: The patient is a 73 y.o. female who presents today for chronic follow up.   Hypertension: Here for follow up of hypertension.  Currently on lopressor bid.  Takes medication as prescribed and denies any side effects. Exercise includes none. Weight has been stable. Denies any chest pain, headaches, shortness of breath, vision changes, swelling in lower extremities. She states her blood pressure is labile. She states blood pressure is normal when she is at home.   Hyperlipidemia: ASCVD risk is 14.7%. declined statin in the past. Discussing today.   Depression and anxiety: has been tried on multiple drugs. Stopped all of them. Hard with SIADH as well. She was on trintellix and thought this really helped her, but she stopped due to increased nausea. She is wanting to know if she should try again.   SIADH: monthly bmp. Due for this today. She stopped taking her sodium tablets years ago, but with her recent endoscopy we had to bring this up. I had her start back on her salt tablets. She is currently taking 2/day. I did want her to drop back down to 1/day, but she didn't do this. She overall feels fun.   Flu shot today   Primary insomnia: on ambien prn.   Review of Systems  Constitutional: Positive for fatigue.  Eyes: Positive for visual disturbance.  Respiratory: Positive for shortness of breath.   Cardiovascular: Negative for chest pain, palpitations and leg swelling.  Gastrointestinal: Positive for nausea. Negative for abdominal pain, diarrhea and vomiting.  Neurological: Negative for dizziness and headaches.  Psychiatric/Behavioral: Positive for sleep disturbance.    Allergies Patient is allergic to metronidazole; fentanyl; ciprofloxacin; dilaudid [hydromorphone hcl];  and visipaque [iodixanol].  Past Medical History Patient  has a past medical history of Allergy, Anemia, Anxiety, Arthritis, Cardiac arrhythmia, Depression, Diverticulitis, Diverticulosis, GERD (gastroesophageal reflux disease), Heart murmur, HTN (hypertension), Infertility, female, Neuromuscular disorder ([place]), Peripheral neuropathy, Positive PPD, and SIADH (syndrome of inappropriate ADH production) ([place]).  Surgical History Patient  has a past surgical history that includes Lumbar disc surgery; Lumbar laminectomy; Tonsillectomy; Colonoscopy; Upper gastrointestinal endoscopy; Cervical polypectomy (02/2016); Breast biopsy (Left); Breast excisional biopsy (Left); Colposcopy; Dilation and curettage of uterus; Hysteroscopy; and Cervical biopsy w/ loop electrode excision.  Family History Pateint's family history includes Breast cancer in her mother; Depression in her brother and father; Early death in her father; Glaucoma in her mother; Heart attack in her father; Heart disease in her father; Hypertension in her brother; Miscarriages / [place] in her mother; Prostate cancer in her paternal grandfather; Tuberculosis in her paternal grandmother.  Social History Patient  reports that she has never smoked. She has never used smokeless tobacco. She reports current alcohol use of about 14.0 standard drinks of alcohol per week. She reports that she does not use drugs.    Objective: Vitals:   06/29/19 1137 06/29/19 1211  BP: (!) 142/88 (!) 150/82  Pulse: 88   Temp: 98.3 F (36.8 C)   TempSrc: Skin   SpO2: 96%   Weight: 185 lb (83.9 kg)   Height: 5\' 6"  (1.676 m)     Body mass index is 29.86 kg/m.  Physical Exam Vitals signs reviewed.  Constitutional:      Appearance: Normal appearance. She is well-developed.  HENT:  Head: Normocephalic and atraumatic.     Right Ear: Tympanic membrane, ear canal and external ear normal.     Left Ear: Tympanic membrane, ear canal and external ear normal.      Nose: Nose normal.     Mouth/Throat:     Mouth: Mucous membranes are moist.  Eyes:     Extraocular Movements: Extraocular movements intact.     Conjunctiva/sclera: Conjunctivae normal.     Pupils: Pupils are equal, round, and reactive to light.  Neck:     Musculoskeletal: Normal range of motion and neck supple.     Thyroid: No thyromegaly.  Cardiovascular:     Rate and Rhythm: Normal rate and regular rhythm.     Pulses: Normal pulses.     Heart sounds: Normal heart sounds. No murmur.  Pulmonary:     Effort: Pulmonary effort is normal.     Breath sounds: Normal breath sounds.  Abdominal:     General: Abdomen is flat. Bowel sounds are normal. There is no distension.     Palpations: Abdomen is soft.     Tenderness: There is no abdominal tenderness.  Lymphadenopathy:     Cervical: No cervical adenopathy.  Skin:    General: Skin is warm and dry.     Findings: No rash.  Neurological:     General: No focal deficit present.     Mental Status: She is alert and oriented to person, place, and time.     Cranial Nerves: No cranial nerve deficit.     Coordination: Coordination normal.     Deep Tendon Reflexes: Reflexes normal.  Psychiatric:        Mood and Affect: Mood normal.        Behavior: Behavior normal.    GAD 7 : Generalized Anxiety Score 06/29/2019 05/04/2019 02/18/2019  Nervous, Anxious, on Edge 1 2 2   Control/stop worrying 2 1 2   Worry too much - different things 2 1 0  Trouble relaxing 1 1 0  Restless 0 0 0  Easily annoyed or irritable 0 0 0  Afraid - awful might happen 0 1 0  Total GAD 7 Score 6 6 4   Anxiety Difficulty Not difficult at all Not difficult at all Somewhat difficult    Depression screen [facility] 2/9 06/29/2019 05/04/2019 02/18/2019 11/10/2017  Decreased Interest 0 1 2 0  Down, Depressed, Hopeless 0 1 2 0  PHQ - 2 Score 0 2 4 0  Altered sleeping 3 1 3  -  Tired, decreased energy 1 1 1  -  Change in appetite 0 2 0 -  Feeling bad or failure about yourself  0 0  0 -  Trouble concentrating 1 0 0 -  Moving slowly or fidgety/restless 0 0 0 -  Suicidal thoughts 0 0 0 -  PHQ-9 Score 5 6 8  -  Difficult doing work/chores Not difficult at all Not difficult at all Somewhat difficult -       Assessment/plan: 1. Essential hypertension Above goal on repeat as well. She states they are labile and more at goal at home. Her husband is a physician and checks her blood pressure. She will continue to keep a log. Discussed risks of uncontrolled htn. She has f/u with dr. Burt Knack next month and they can see what it is running at that time. Continue lopressor bid. No refills needed. Routine lab work today. F/u in 6 months.  - CBC with Differential/Platelet - Comprehensive metabolic panel - Microalbumin / creatinine urine ratio  2. SIADH -  Compr(syndrome of inappropriate ADH production) (HCC) Would drop back down to one salt tablet/day, but we will see where her sodium is at first.  ehensive metabolic panel  3. Anxiety gad7 score is mild. She is going to start back her trintellix. We will see how she does with the nausea. F/u in 6 months or sooner if needed.   4. Vitamin D deficiency On 4000IU/day. Was just below normal and wants to recheck this today.  - VITAMIN D 25 Hydroxy (Vit-D Deficiency, Fractures)  5. Mixed hyperlipidemia ascvd risk of 14.7% risk discussed with her. Her cholesterol numbers are good, but discussed we would be treating her 10 year risk, not the number. I do think she could live for another 10 years and could benefit from the statin. She would like to think about and discuss with dr. Burt Knack.  - Lipid panel  6. Depression, recurrent ([place]) Was on trintellix and really thought this was helping, but she stopped due to side effect of nausea. She is wanting to retry this. Will have her start back and if increased nausea, would stop it again. Her gad7 and phq9 scores are well controlled off medication today. F/u in 6 months or sooner if needed.   7.  Primary insomnia pmp website checked and she is filling ambien correctly. Takes nightly. Refill not yet time.   -flu shot today  Return in about 6 months (around 12/28/2019).   Orma Flaming, MD [place]  06/29/2019

## 2019-06-29 NOTE — Patient Instructions (Signed)
Discuss statin therapy with dr. Burt Knack. ascvd risk is 14.7%. would be treating your risk, not the numbers.   Blood pressure is high. Keep an eye on it and will see Dr. Burt Knack next month and me back in 6 months. Let one of [place] know if trending upward.

## 2019-07-06 ENCOUNTER — Other Ambulatory Visit: Payer: Self-pay | Admitting: Family Medicine

## 2019-07-14 ENCOUNTER — Ambulatory Visit: Payer: Medicare Other | Admitting: Internal Medicine

## 2019-07-14 ENCOUNTER — Ambulatory Visit: Payer: 59 | Admitting: Cardiovascular Disease

## 2019-07-28 ENCOUNTER — Other Ambulatory Visit: Payer: Self-pay

## 2019-08-01 ENCOUNTER — Ambulatory Visit: Payer: Medicare Other | Admitting: Obstetrics & Gynecology

## 2019-08-01 NOTE — Progress Notes (Deleted)
73 y.o. [place] Married White or Caucasian female here for annual exam.    No LMP recorded (lmp unknown). Patient is postmenopausal.          Sexually active: {yes no:314532}  The current method of family planning is {contraception:315051}.    Exercising: {yes no:314532}  {types:19826} Smoker:  {YES NO:22349}  Health Maintenance: Pap:  *** History of abnormal Pap:  {YES NO:22349} MMG:  *** Colonoscopy:  *** BMD:   *** TDaP:  *** Pneumonia vaccine(s):  *** Shingrix:   *** Hep C testing: *** Screening Labs: ***   reports that she has never smoked. She has never used smokeless tobacco. She reports current alcohol use of about 14.0 standard drinks of alcohol per week. She reports that she does not use drugs.  Past Medical History:  Diagnosis Date  . Allergy   . Anemia   . Anxiety   . Arthritis    djd  . Cardiac arrhythmia   . Depression   . Diverticulitis   . Diverticulosis   . GERD (gastroesophageal reflux disease)   . Heart murmur   . HTN (hypertension)   . Infertility, female   . Neuromuscular disorder ([place])    idiopathic peripheral  neuropathy  . Peripheral neuropathy   . Positive PPD   . SIADH (syndrome of inappropriate ADH production) ([place])     Past Surgical History:  Procedure Laterality Date  . BREAST BIOPSY Left   . BREAST EXCISIONAL BIOPSY Left    benign  . CERVICAL BIOPSY  W/ LOOP ELECTRODE EXCISION    . CERVICAL POLYPECTOMY  02/2016  . COLONOSCOPY    . COLPOSCOPY    . DILATION AND CURETTAGE OF UTERUS    . HYSTEROSCOPY    . LUMBAR DISC SURGERY    . LUMBAR LAMINECTOMY    . TONSILLECTOMY    . UPPER GASTROINTESTINAL ENDOSCOPY      Current Outpatient Medications  Medication Sig Dispense Refill  . augmented betamethasone dipropionate (DIPROLENE-AF) 0.05 % cream Apply topically 2 (two) times daily. 30 g 1  . diazepam (VALIUM) 10 MG tablet TAKE ONE TABLET TWICE DAILY AS NEEDED FOR ANXIETY 60 tablet 0  . gabapentin (NEURONTIN) 600 MG tablet TAKE ONE  TABLET THREE TIMES DAILY 270 tablet 1  . metoprolol tartrate (LOPRESSOR) 25 MG tablet Take 1 tablet (25 mg total) by mouth 2 (two) times daily. 60 tablet 11  . omeprazole (PRILOSEC) 40 MG capsule Take 1 capsule (40 mg total) by mouth daily. 90 capsule 3  . ondansetron (ZOFRAN-ODT) 8 MG disintegrating tablet TAKE ONE TABLET EVERY EIGHT HOURS AS NEEDED FOR NAUSEA AND VOMITING 20 tablet 1  . Probiotic Product (ALIGN PO) Take by mouth daily.    Marland Kitchen RA Vitamin E-Vit A & D 4000 units CREA Apply topically.    . sodium chloride 1 g tablet Take 1 g by mouth 3 (three) times daily.    Marland Kitchen triamcinolone cream (KENALOG) 0.1 % Apply topically 2 (two) times daily. 80 g 0  . vortioxetine HBr (TRINTELLIX) 10 MG TABS tablet Take 1 tablet (10 mg total) by mouth daily. 30 tablet 11  . zolpidem (AMBIEN) 10 MG tablet TAKE ONE TABLET AT BEDTIME AS NEEDED 90 tablet 0   No current facility-administered medications for this visit.     Family History  Problem Relation Age of Onset  . Breast cancer Mother   . Glaucoma Mother   . Miscarriages / [place] Mother   . Heart attack Father   . Depression  Father   . Early death Father   . Heart disease Father   . Tuberculosis Paternal Grandmother   . Prostate cancer Paternal Grandfather   . Depression Brother   . Hypertension Brother   . Colon cancer Neg Hx   . Esophageal cancer Neg Hx   . Rectal cancer Neg Hx   . Stomach cancer Neg Hx     Review of Systems  Exam:   LMP  (LMP Unknown)   Height:      Ht Readings from Last 3 Encounters:  06/29/19 5\' 6"  (1.676 m)  06/07/19 5\' 6"  (1.676 m)  05/25/19 5\' 6"  (1.676 m)    General appearance: alert, cooperative and appears stated age Head: Normocephalic, without obvious abnormality, atraumatic Neck: no adenopathy, supple, symmetrical, trachea midline and thyroid {EXAM; THYROID:18604} Lungs: clear to auscultation bilaterally Breasts: {Exam; breast:13139::"normal appearance, no masses or tenderness"} Heart: regular  rate and rhythm Abdomen: soft, non-tender; bowel sounds normal; no masses,  no organomegaly Extremities: extremities normal, atraumatic, no cyanosis or edema Skin: Skin color, texture, turgor normal. No rashes or lesions Lymph nodes: Cervical, supraclavicular, and axillary nodes normal. No abnormal inguinal nodes palpated Neurologic: Grossly normal   Pelvic: External genitalia:  no lesions              Urethra:  normal appearing urethra with no masses, tenderness or lesions              Bartholins and Skenes: normal                 Vagina: normal appearing vagina with normal color and discharge, no lesions              Cervix: {exam; cervix:14595}              Pap taken: {yes no:314532} Bimanual Exam:  Uterus:  {exam; uterus:12215}              Adnexa: {exam; adnexa:12223}               Rectovaginal: Confirms               Anus:  normal sphincter tone, no lesions  Chaperone was present for exam.  A:  Well Woman with normal exam  P:   {plan; gyn:5269::"mammogram","pap smear","return annually or prn"}

## 2019-08-05 ENCOUNTER — Other Ambulatory Visit: Payer: Self-pay | Admitting: Family Medicine

## 2019-08-05 ENCOUNTER — Other Ambulatory Visit: Payer: Self-pay

## 2019-08-05 NOTE — Telephone Encounter (Signed)
Pt requesting refill on Diazepam. Pt has appt scheduled for 12/2019.

## 2019-08-08 ENCOUNTER — Ambulatory Visit (INDEPENDENT_AMBULATORY_CARE_PROVIDER_SITE_OTHER): Payer: Medicare Other | Admitting: Obstetrics & Gynecology

## 2019-08-08 ENCOUNTER — Other Ambulatory Visit: Payer: Self-pay | Admitting: Family Medicine

## 2019-08-08 ENCOUNTER — Telehealth: Payer: Self-pay | Admitting: *Deleted

## 2019-08-08 ENCOUNTER — Other Ambulatory Visit: Payer: Self-pay

## 2019-08-08 ENCOUNTER — Encounter: Payer: Self-pay | Admitting: Obstetrics & Gynecology

## 2019-08-08 VITALS — BP 122/70 | HR 76 | Temp 97.3°F | Resp 12 | Ht 66.0 in

## 2019-08-08 DIAGNOSIS — D251 Intramural leiomyoma of uterus: Secondary | ICD-10-CM | POA: Diagnosis not present

## 2019-08-08 DIAGNOSIS — Z803 Family history of malignant neoplasm of breast: Secondary | ICD-10-CM | POA: Diagnosis not present

## 2019-08-08 DIAGNOSIS — Z124 Encounter for screening for malignant neoplasm of cervix: Secondary | ICD-10-CM | POA: Diagnosis not present

## 2019-08-08 DIAGNOSIS — Z01419 Encounter for gynecological examination (general) (routine) without abnormal findings: Secondary | ICD-10-CM

## 2019-08-08 DIAGNOSIS — E222 Syndrome of inappropriate secretion of antidiuretic hormone: Secondary | ICD-10-CM | POA: Diagnosis not present

## 2019-08-08 MED ORDER — BETAMETHASONE DIPROPIONATE AUG 0.05 % EX CREA: TOPICAL_CREAM | Freq: Two times a day (BID) | CUTANEOUS | 1 refills | Status: DC

## 2019-08-08 NOTE — Telephone Encounter (Signed)
Patient seen in office today, requesting to proceed with abbreviated breast MRI at [facility].   Family Hx breast cancer mother Lilian Kapur model risk 11.6%   Written order for abbreviated breast MRI w/wo contrast to Dr. [name] Heck to review and sign.

## 2019-08-08 NOTE — Telephone Encounter (Signed)
Spoke with Loree Fee at [facility] [phone]. Confirmed abbreviated breast MRI can be preformed at [facility], [place], [place] location. Call transferred to [facility] in scheduling [phone]. Was advised to fax signed order and patient demographics to [phone], they will contact patient directly to schedule.    Call to patient to notify, no answer, mailbox full.

## 2019-08-08 NOTE — Progress Notes (Signed)
73 y.o. Katrina Benitez Married White or Caucasian female here for annual exam.  Denies vaginal bleeding.  She did not schedule her limited breast MRI.  Would like to do to [facility].  She would really like a full breast MRI.  She does not need this based on current recommendations at her lifetime risk of breast cancer is 11.6% based on [employer] model done earlier this year with pt.  Denies vaginal bleeding.    Patient would like refills on Diprolene cream and diazepam.  These have been refilled in the past from her PCP, Dr. Rogers Blocker.    No LMP recorded (lmp unknown). Patient is postmenopausal.          Sexually active: No.  The current method of family planning is post menopausal status.    Exercising: Yes.    treadmill and personal trainer Smoker:  no  Health Maintenance: Pap:  07/13/18 Neg History of abnormal Pap:  no MMG:  03/28/19 BIRADS 1 negative/density c Colonoscopy:  06/07/19 polyps removed.  Adenomas x 2.  Has follow up in December. BMD:   03/28/19 Osteopenia TDaP:  2016 Pneumonia vaccine(s):  completed Shingrix:   completed Hep C testing: 06/02/16 Neg Screening Labs: PCP   reports that she has never smoked. She has never used smokeless tobacco. She reports current alcohol use of about 14.0 standard drinks of alcohol per week. She reports that she does not use drugs.  Past Medical History:  Diagnosis Date  . Allergy   . Anemia   . Anxiety   . Arthritis    djd  . Cardiac arrhythmia   . Depression   . Diverticulitis   . Diverticulosis   . GERD (gastroesophageal reflux disease)   . Heart murmur   . HTN (hypertension)   . Infertility, female   . Neuromuscular disorder ([place])    idiopathic peripheral  neuropathy  . Peripheral neuropathy   . Positive PPD   . SIADH (syndrome of inappropriate ADH production) ([place])     Past Surgical History:  Procedure Laterality Date  . BREAST BIOPSY Left   . BREAST EXCISIONAL BIOPSY Left    benign  . CERVICAL BIOPSY  W/ LOOP ELECTRODE EXCISION     . CERVICAL POLYPECTOMY  02/2016  . COLONOSCOPY    . COLPOSCOPY    . DILATION AND CURETTAGE OF UTERUS    . HYSTEROSCOPY    . LUMBAR DISC SURGERY    . LUMBAR LAMINECTOMY    . TONSILLECTOMY    . UPPER GASTROINTESTINAL ENDOSCOPY      Current Outpatient Medications  Medication Sig Dispense Refill  . augmented betamethasone dipropionate (DIPROLENE-AF) 0.05 % cream Apply topically 2 (two) times daily. 30 g 1  . clobetasol (TEMOVATE) 0.05 % external solution APPLY TO AFFECTED AREA UPTO TWICE DAILY AS NEEDED (NOT TO FACE, GROIN, OR UNDERARMS)    . diazepam (VALIUM) 10 MG tablet TAKE ONE TABLET TWICE DAILY AS NEEDED FOR ANXIETY 60 tablet 0  . gabapentin (NEURONTIN) 600 MG tablet TAKE ONE TABLET THREE TIMES DAILY 270 tablet 1  . metoprolol tartrate (LOPRESSOR) 25 MG tablet Take 1 tablet (25 mg total) by mouth 2 (two) times daily. 60 tablet 11  . Multiple Vitamins-Minerals (VITAMIN D3 COMPLETE PO) Take 4,000 Units by mouth.    Marland Kitchen omeprazole (PRILOSEC) 40 MG capsule Take 1 capsule (40 mg total) by mouth daily. 90 capsule 3  . ondansetron (ZOFRAN-ODT) 8 MG disintegrating tablet TAKE ONE TABLET EVERY EIGHT HOURS AS NEEDED FOR NAUSEA AND VOMITING  20 tablet 1  . Probiotic Product (ALIGN PO) Take by mouth daily.    . sodium chloride 1 g tablet Take 1 g by mouth 3 (three) times daily.    Marland Kitchen triamcinolone cream (KENALOG) 0.1 % Apply topically 2 (two) times daily. 80 g 0  . zolpidem (AMBIEN) 10 MG tablet TAKE ONE TABLET AT BEDTIME AS NEEDED 90 tablet 0  . vortioxetine HBr (TRINTELLIX) 10 MG TABS tablet Take 1 tablet (10 mg total) by mouth daily. (Patient not taking: Reported on 08/08/2019) 30 tablet 11   No current facility-administered medications for this visit.     Family History  Problem Relation Age of Onset  . Breast cancer Mother   . Glaucoma Mother   . Miscarriages / [place] Mother   . Heart attack Father   . Depression Father   . Early death Father   . Heart disease Father   .  Tuberculosis Paternal Grandmother   . Prostate cancer Paternal Grandfather   . Depression Brother   . Hypertension Brother   . Colon cancer Neg Hx   . Esophageal cancer Neg Hx   . Rectal cancer Neg Hx   . Stomach cancer Neg Hx     Review of Systems  All other systems reviewed and are negative.   Exam:   BP 122/70 (BP Location: Right Arm, Patient Position: Sitting, Cuff Size: Large)   Pulse 76   Temp (!) 97.3 F (36.3 C) (Temporal)   Resp 12   Ht 5\' 6"  (1.676 m)   LMP  (LMP Unknown)   BMI 29.86 kg/m   Height: 5\' 6"  (167.6 cm)  Ht Readings from Last 3 Encounters:  08/08/19 5\' 6"  (1.676 m)  06/29/19 5\' 6"  (1.676 m)  06/07/19 5\' 6"  (1.676 m)    General appearance: alert, cooperative and appears stated age Head: Normocephalic, without obvious abnormality, atraumatic Neck: no adenopathy, supple, symmetrical, trachea midline and thyroid normal to inspection and palpation Lungs: clear to auscultation bilaterally Breasts: normal appearance, no masses or tenderness Heart: regular rate and rhythm Abdomen: soft, non-tender; bowel sounds normal; no masses,  no organomegaly Extremities: extremities normal, atraumatic, no cyanosis or edema Skin: Skin color, texture, turgor normal. No rashes or lesions Lymph nodes: Cervical, supraclavicular, and axillary nodes normal. No abnormal inguinal nodes palpated Neurologic: Grossly normal   Pelvic: External genitalia:  no lesions              Urethra:  normal appearing urethra with no masses, tenderness or lesions              Bartholins and Skenes: normal                 Vagina: normal appearing vagina with normal color and discharge, no lesions              Cervix: no lesions              Pap taken: No. Bimanual Exam:  Uterus:  normal size, contour, position, consistency, mobility, non-tender              Adnexa: normal adnexa and no mass, fullness, tenderness               Rectovaginal: Deferred               Anus:  no  lesions  Chaperone was present for exam.  A:  Well Woman with normal exam PMP, no HRT H/o uterine fibroids, 3.0cm fibroid 2/19 [facility]  H/o endometrial polyps s/p resection H/o SIADH Family hx of breast cancer Osteopenia Hypertension  P:    Pt is having yearly mammogram and limited breat MRI will be scheduled at [facility] for her pap smear neg 10/19.  Not indicated today. BMD 7/20 Colonoscopy just completed.  She will see Dr. Henrene Pastor in December for follow up BMP obtained today (so she does not have to go to another office for lab work) Return annually or prn

## 2019-08-09 ENCOUNTER — Encounter: Payer: Self-pay | Admitting: Obstetrics & Gynecology

## 2019-08-09 LAB — BASIC METABOLIC PANEL
BUN/Creatinine Ratio: 23 (ref 12–28)
BUN: 19 mg/dL (ref 8–27)
CO2: 24 mmol/L (ref 20–29)
Calcium: 9.3 mg/dL (ref 8.7–10.3)
Chloride: 100 mmol/L (ref 96–106)
Creatinine, Ser: 0.82 mg/dL (ref 0.57–1.00)
GFR calc Af Amer: 82 mL/min/{1.73_m2} (ref >59)
GFR calc non Af Amer: 71 mL/min/{1.73_m2} (ref >59)
Glucose: 84 mg/dL (ref 65–99)
Potassium: 5.3 mmol/L — ABNORMAL HIGH (ref 3.5–5.2)
Sodium: 138 mmol/L (ref 134–144)

## 2019-08-09 NOTE — Telephone Encounter (Signed)
Physician Referral Form received from [facility].  Form completed and faxed to [phone].

## 2019-08-10 NOTE — Telephone Encounter (Signed)
Patient called. See telephone encounter dated 08/08/19.   Encounter closed.

## 2019-08-10 NOTE — Telephone Encounter (Signed)
Spoke with patient. Advised referral has been faxed to [facility] to request scheduling of abbreviated breast MRI, they should contact you directly to schedule. Patient agreeable and is aware to call if any additional assistance needed.   Patient request 08/08/19 lab results, specifically sodium and potassium. Advised patient results have not been reviewed by Dr. [name] Heck for final recommendations, advised Sodium 138 and K+ 5.3. Our office will notify of results once reviewed. Patient verbalizes understanding and is appreciative of call.   Routing to Dr. [name] Heck to review lab results.

## 2019-08-11 NOTE — Telephone Encounter (Signed)
Spoke with patient. Patient request copy of 08/08/19 labs to be forwarded to Dr. Rogers Blocker.   Labs faxed via Epic to Dr. Rogers Blocker.   Patient verbalizes understanding and is agreeable.   Encounter closed.

## 2019-08-11 NOTE — Telephone Encounter (Signed)
I have reviewed this.  She has hx of SIADH, so has blood work done monthly.  I ordered this so she did not have to go to another office for this.  Does she want this forwarded to her PCP?  Thanks.

## 2019-08-15 ENCOUNTER — Telehealth: Payer: Self-pay | Admitting: Family Medicine

## 2019-08-15 NOTE — Telephone Encounter (Signed)
Please let her know her sodium looks great. Got labs from gyn.  Have a most wonderful thanksgiving!  Orma Flaming, MD [place]

## 2019-08-17 NOTE — Telephone Encounter (Signed)
Patient informed via MyChart message.

## 2019-08-30 ENCOUNTER — Encounter: Payer: Self-pay | Admitting: Internal Medicine

## 2019-08-30 ENCOUNTER — Ambulatory Visit (INDEPENDENT_AMBULATORY_CARE_PROVIDER_SITE_OTHER): Payer: Medicare Other | Admitting: Internal Medicine

## 2019-08-30 VITALS — BP 118/70 | Temp 96.6°F | Ht 66.0 in

## 2019-08-30 DIAGNOSIS — K219 Gastro-esophageal reflux disease without esophagitis: Secondary | ICD-10-CM

## 2019-08-30 NOTE — Progress Notes (Signed)
HISTORY OF PRESENT ILLNESS:  Katrina Benitez is a 73 y.o. female, retired Surveyor, quantity, who was last seen in the office May 25, 2019 regarding change in bowel habits, GERD, and abdominal discomfort.  See that dictation for details.  She was prescribed omeprazole and Zofran.  She was scheduled for upper endoscopy and colonoscopy.  Upper endoscopy revealed a large caliber distal esophageal ring and nonspecific gastritis.  Testing for Helicobacter pylori was negative.  She was continued on omeprazole and Zofran.  Colonoscopy revealed diminutive adenomas and diverticulosis.  Normal terminal ileum.  Metamucil 2 tablespoons daily recommended.  She presents today for follow-up as requested.  She is pleased to report that she is doing better.  Her abdominal pain has resolved.  Still occasional nausea which she manages with Zofran and an acids.  Reflux is better on regular PPI.  Bowel habit consistency has improved with fiber.  We reviewed the findings of the procedures and pathology.  REVIEW OF SYSTEMS:  All non-GI ROS negative unless otherwise stated in the HPI except for sinus and allergy, anxiety, arthritis, back pain, fatigue, itching, sleeping problems  Past Medical History:  Diagnosis Date  . Allergy   . Anemia   . Anxiety   . Arthritis    degenerative disc disorder  . Cardiac arrhythmia   . Depression   . Diverticulitis   . Diverticulosis   . GERD (gastroesophageal reflux disease)   . Heart murmur   . HTN (hypertension)   . Infertility, female   . Neuromuscular disorder ([place])    idiopathic peripheral  neuropathy  . Peripheral neuropathy   . Positive PPD   . SIADH (syndrome of inappropriate ADH production) ([place])     Past Surgical History:  Procedure Laterality Date  . BREAST BIOPSY Left   . BREAST EXCISIONAL BIOPSY Left    benign  . CERVICAL BIOPSY  W/ LOOP ELECTRODE EXCISION    . CERVICAL POLYPECTOMY  02/2016  . COLONOSCOPY    . COLPOSCOPY    . DILATION AND  CURETTAGE OF UTERUS    . HYSTEROSCOPY    . LUMBAR DISC SURGERY    . LUMBAR LAMINECTOMY    . TONSILLECTOMY    . UPPER GASTROINTESTINAL ENDOSCOPY      Social History Katrina Benitez  reports that she has never smoked. She has never used smokeless tobacco. She reports current alcohol use of about 14.0 standard drinks of alcohol per week. She reports that she does not use drugs.  family history includes Breast cancer in her mother; Depression in her brother and father; Early death in her father; Glaucoma in her mother; Heart attack in her father; Heart disease in her father; Hypertension in her brother; Miscarriages / [place] in her mother; Prostate cancer in her paternal grandfather; Tuberculosis in her paternal grandmother.  Allergies  Allergen Reactions  . Metronidazole Hives  . Fentanyl Itching  . Ciprofloxacin Hives  . Dilaudid [Hydromorphone Hcl] Hives  . Visipaque [Iodixanol] Swelling    Throat swelling       PHYSICAL EXAMINATION: Vital signs: BP 118/70   Temp (!) 96.6 F (35.9 C)   Ht 5\' 6"  (1.676 m)   LMP  (LMP Unknown)   BMI 29.86 kg/m   Constitutional: generally well-appearing, no acute distress Psychiatric: alert and oriented x3, cooperative Eyes: anicteric, conjunctiva pink Mouth: Mask Abdomen: Not reexamined Skin: no jaundice Neuro: No gross focal deficits.   ASSESSMENT:  1.  GERD.  Symptoms better controlled on PPI.  I suspect  this may have been the root of her abdominal pain. 2.  Large caliber distal esophageal ring.  Previously dilated for dysphagia 3.  Nausea.  Controlled with Zofran 4.  Diminutive adenomatous colon polyps and diverticulosis on colonoscopy. 5.  Change in bowel habits.  Consistency improved with fiber   PLAN:  1.  Reflux precautions 2.  Continue PPI 3.  Continue Zofran as needed 4.  Continue fiber supplementation 5.  Routine GI follow-up as needed

## 2019-08-30 NOTE — Patient Instructions (Signed)
Please follow up as needed 

## 2019-09-01 ENCOUNTER — Telehealth: Payer: Self-pay

## 2019-09-01 NOTE — Telephone Encounter (Signed)
Spoke to patient and advised per Dr. Earna Coder normal sodium levels in recent blood work done by gyn doctor-Dr. Hale Bogus.  Patient verbalized understanding.

## 2019-09-07 ENCOUNTER — Telehealth: Payer: Self-pay

## 2019-09-07 NOTE — Telephone Encounter (Signed)
Copied from [facility] [id number]. Topic: Referral - Request for Referral >> Sep 07, 2019  3:58 PM Percell Belt A wrote: Has patient seen PCP for this complaint? Yes  *If NO, is insurance requiring patient see PCP for this issue before PCP can refer them? Referral for which specialty: Neuro  Preferred provider/office: Dr Floyde Parkins  Address: [address], [place],  [address] Reason for referral: Unsteady Gait

## 2019-09-08 ENCOUNTER — Other Ambulatory Visit: Payer: Self-pay

## 2019-09-08 DIAGNOSIS — R2681 Unsteadiness on feet: Secondary | ICD-10-CM

## 2019-09-08 NOTE — Telephone Encounter (Signed)
Referral placed for neurology-Dr. Lenor Coffin Dx: unsteady gait

## 2019-09-13 ENCOUNTER — Telehealth: Payer: Self-pay | Admitting: Family Medicine

## 2019-09-30 ENCOUNTER — Telehealth: Payer: Self-pay | Admitting: Family Medicine

## 2019-09-30 NOTE — Telephone Encounter (Signed)
Patient called in saying she was exposed to a person and their family who are now experiencing symptoms. She wanted to know if she could speak with Dr.Wolfe or Delsa Sale to talk about what her and her husband should do now.

## 2019-09-30 NOTE — Telephone Encounter (Signed)
If exposed >15 minutes without PPE should be tested or if having symptoms. See below.  Covid testing Text COVID to [phone] OR email [email or web address] to set up appointment. You no longer need an order and can just set up appointment to be tested. If no phone or access to internet phone number to call and set this up is: [phone]  Orma Flaming, MD [place]

## 2019-10-04 DIAGNOSIS — Z1159 Encounter for screening for other viral diseases: Secondary | ICD-10-CM | POA: Diagnosis not present

## 2019-10-04 NOTE — Telephone Encounter (Signed)
Called to speak with patient regarding questions about being tested for covid and patient stated that she "already had it taken care of" and hung up on me.

## 2019-10-12 ENCOUNTER — Other Ambulatory Visit: Payer: Self-pay | Admitting: Family Medicine

## 2019-10-14 ENCOUNTER — Ambulatory Visit: Payer: Medicare Other | Attending: Internal Medicine

## 2019-10-14 DIAGNOSIS — Z23 Encounter for immunization: Secondary | ICD-10-CM | POA: Insufficient documentation

## 2019-10-14 NOTE — Progress Notes (Signed)
   Covid-19 Vaccination Clinic  Name:  Katrina Benitez    MRN: [id number] DOB: 1946-05-07  10/14/2019  [name] was observed post Covid-19 immunization for 15 minutes without incidence. She was provided with Vaccine Information Sheet and instruction to access the V-Safe system.   [name] was instructed to call 911 with any severe reactions post vaccine: Marland Kitchen Difficulty breathing  . Swelling of your face and throat  . A fast heartbeat  . A bad rash all over your body  . Dizziness and weakness    Immunizations Administered    Name Date Dose VIS Date Route   Pfizer COVID-19 Vaccine 10/14/2019  5:24 PM 0.3 mL 09/02/2019 Intramuscular   Manufacturer: [place]   Lot: [id number]   [place]: [id number]

## 2019-10-24 ENCOUNTER — Ambulatory Visit: Payer: Medicare Other | Admitting: Obstetrics & Gynecology

## 2019-10-31 DIAGNOSIS — H1045 Other chronic allergic conjunctivitis: Secondary | ICD-10-CM | POA: Diagnosis not present

## 2019-10-31 DIAGNOSIS — R21 Rash and other nonspecific skin eruption: Secondary | ICD-10-CM | POA: Diagnosis not present

## 2019-10-31 DIAGNOSIS — J301 Allergic rhinitis due to pollen: Secondary | ICD-10-CM | POA: Diagnosis not present

## 2019-10-31 DIAGNOSIS — L299 Pruritus, unspecified: Secondary | ICD-10-CM | POA: Diagnosis not present

## 2019-11-03 ENCOUNTER — Ambulatory Visit: Payer: Medicare Other | Attending: Internal Medicine

## 2019-11-03 DIAGNOSIS — Z23 Encounter for immunization: Secondary | ICD-10-CM | POA: Insufficient documentation

## 2019-11-03 NOTE — Progress Notes (Signed)
   Covid-19 Vaccination Clinic  Name:  Katrina Benitez    MRN: [id number] DOB: 1946-01-04  11/03/2019  [name] was observed post Covid-19 immunization for 30 minutes based on pre-vaccination screening without incidence. She was provided with Vaccine Information Sheet and instruction to access the V-Safe system.   [name] was instructed to call 911 with any severe reactions post vaccine: Marland Kitchen Difficulty breathing  . Swelling of your face and throat  . A fast heartbeat  . A bad rash all over your body  . Dizziness and weakness    Immunizations Administered    Name Date Dose VIS Date Route   Pfizer COVID-19 Vaccine 11/03/2019 12:23 PM 0.3 mL 09/02/2019 Intramuscular   Manufacturer: [place]   Lot: [id number]   [place]: [id number]

## 2019-11-04 ENCOUNTER — Ambulatory Visit (INDEPENDENT_AMBULATORY_CARE_PROVIDER_SITE_OTHER): Payer: Medicare Other | Admitting: Cardiovascular Disease

## 2019-11-04 ENCOUNTER — Other Ambulatory Visit: Payer: Self-pay

## 2019-11-04 ENCOUNTER — Encounter: Payer: Self-pay | Admitting: Cardiovascular Disease

## 2019-11-04 VITALS — BP 128/64 | HR 68 | Ht 66.0 in | Wt 190.0 lb

## 2019-11-04 DIAGNOSIS — I351 Nonrheumatic aortic (valve) insufficiency: Secondary | ICD-10-CM | POA: Diagnosis not present

## 2019-11-04 DIAGNOSIS — I872 Venous insufficiency (chronic) (peripheral): Secondary | ICD-10-CM

## 2019-11-04 DIAGNOSIS — I480 Paroxysmal atrial fibrillation: Secondary | ICD-10-CM

## 2019-11-04 MED ORDER — FUROSEMIDE 20 MG PO TABS: 10.0000 mg | ORAL_TABLET | Freq: Every day | ORAL | 6 refills | Status: DC | PRN

## 2019-11-04 NOTE — Progress Notes (Signed)
Cardiology Office Note:    Date:  11/04/2019   ID:  Katrina, Benitez 1945/10/26, MRN [id number]  PCP:  Orma Flaming, MD  Cardiologist:  Sherren Mocha, MD  Electrophysiologist:  None   Referring MD: Orma Flaming, MD   Chief Complaint  Patient presents with  . Atrial Fibrillation    History of Present Illness:    Katrina Benitez is a 74 y.o. female with a hx of paroxysmal atrial fibrillation, presenting for follow-up evaluation.  She had atrial fibrillation with RVR in the setting of an acute pulmonary infection in May 2020.  She spontaneously converted back to sinus rhythm.  She was anticoagulated with apixaban for a period of time but this has been discontinued in the absence of any further atrial fibrillation.  The only known episode she has had is when she was acutely ill last year.  She has remained on metoprolol.  The patient is here alone today.  She has not had any recent problems with chest pain, shortness of breath, orthopnea, PND, or heart palpitations.  She had an episode of chest pain quite some time ago and learned that it is related to postherpetic neuralgia.  She is doing better on treatment.  She does complain of leg swelling that has been worse of late.  Past Medical History:  Diagnosis Date  . Allergy   . Anemia   . Anxiety   . Arthritis    degenerative disc disorder  . Cardiac arrhythmia   . Depression   . Diverticulitis   . Diverticulosis   . GERD (gastroesophageal reflux disease)   . Heart murmur   . HTN (hypertension)   . Infertility, female   . Neuromuscular disorder ([place])    idiopathic peripheral  neuropathy  . Peripheral neuropathy   . Positive PPD   . SIADH (syndrome of inappropriate ADH production) ([place])     Past Surgical History:  Procedure Laterality Date  . BREAST BIOPSY Left   . BREAST EXCISIONAL BIOPSY Left    benign  . CERVICAL BIOPSY  W/ LOOP ELECTRODE EXCISION    . CERVICAL POLYPECTOMY  02/2016  . COLONOSCOPY    . COLPOSCOPY     . DILATION AND CURETTAGE OF UTERUS    . HYSTEROSCOPY    . LUMBAR DISC SURGERY    . LUMBAR LAMINECTOMY    . TONSILLECTOMY    . UPPER GASTROINTESTINAL ENDOSCOPY      Current Medications: Current Meds  Medication Sig  . augmented betamethasone dipropionate (DIPROLENE-AF) 0.05 % cream Apply topically 2 (two) times daily.  . cholecalciferol (D3 HIGH POTENCY) 10 MCG (400 UNIT) TABS tablet Take 400 Units by mouth.  . clobetasol (TEMOVATE) 0.05 % external solution APPLY TO AFFECTED AREA UPTO TWICE DAILY AS NEEDED (NOT TO FACE, GROIN, OR UNDERARMS)  . diazepam (VALIUM) 10 MG tablet TAKE ONE TABLET TWICE DAILY AS NEEDED FOR ANXIETY  . gabapentin (NEURONTIN) 600 MG tablet TAKE ONE TABLET THREE TIMES DAILY  . levocetirizine (XYZAL) 5 MG tablet Take 5 mg by mouth at bedtime.  . metoprolol tartrate (LOPRESSOR) 25 MG tablet Take 1 tablet (25 mg total) by mouth 2 (two) times daily.  . Multiple Vitamins-Minerals (VITAMIN D3 COMPLETE PO) Take 4,000 Units by mouth.  Marland Kitchen omeprazole (PRILOSEC) 40 MG capsule Take 1 capsule (40 mg total) by mouth daily.  . ondansetron (ZOFRAN-ODT) 8 MG disintegrating tablet TAKE ONE TABLET EVERY EIGHT HOURS AS NEEDED FOR NAUSEA AND VOMITING  . Probiotic Product (ALIGN PO) Take by  mouth daily.  . sodium chloride 1 g tablet Take 1 g by mouth daily.   Marland Kitchen triamcinolone cream (KENALOG) 0.1 % Apply topically 2 (two) times daily.  Marland Kitchen zolpidem (AMBIEN) 10 MG tablet TAKE ONE TABLET AT BEDTIME AS NEEDED     Allergies:   Metronidazole, Fentanyl, Ciprofloxacin, Dilaudid [hydromorphone hcl], and Visipaque [iodixanol]   Social History   Socioeconomic History  . Marital status: Married    Spouse name: Katrina Benitez  . Number of children: 0  . Years of education: College  . Highest education level: Not on file  Occupational History  . Occupation: retired Engineer, drilling  Tobacco Use  . Smoking status: Never Smoker  . Smokeless tobacco: Never Used  Substance and Sexual Activity  . Alcohol use:  Yes    Alcohol/week: 14.0 standard drinks    Types: 14 Glasses of wine per week  . Drug use: No  . Sexual activity: Not Currently    Birth control/protection: Post-menopausal  Other Topics Concern  . Not on file  Social History Narrative   Lives at home with her husband.   Right-handed.   1-2 cups caffeine per day.   Social Determinants of Health   Financial Resource Strain:   . Difficulty of Paying Living Expenses: Not on file  Food Insecurity:   . Worried About Charity fundraiser in the Last Year: Not on file  . Ran Out of Food in the Last Year: Not on file  Transportation Needs:   . Lack of Transportation (Medical): Not on file  . Lack of Transportation (Non-Medical): Not on file  Physical Activity:   . Days of Exercise per Week: Not on file  . Minutes of Exercise per Session: Not on file  Stress:   . Feeling of Stress : Not on file  Social Connections:   . Frequency of Communication with Friends and Family: Not on file  . Frequency of Social Gatherings with Friends and Family: Not on file  . Attends Religious Services: Not on file  . Active Member of Clubs or Organizations: Not on file  . Attends Archivist Meetings: Not on file  . Marital Status: Not on file     Family History: The patient's family history includes Breast cancer in her mother; Depression in her brother and father; Early death in her father; Glaucoma in her mother; Heart attack in her father; Heart disease in her father; Hypertension in her brother; Miscarriages / [place] in her mother; Prostate cancer in her paternal grandfather; Tuberculosis in her paternal grandmother. There is no history of Colon cancer, Esophageal cancer, Rectal cancer, or Stomach cancer.  ROS:   Please see the history of present illness.    All other systems reviewed and are negative.  EKGs/Labs/Other Studies Reviewed:    The following studies were reviewed today: Echocardiogram 01/21/2019: IMPRESSIONS    1.  The left ventricle has normal systolic function with an ejection  fraction of 60-65%. The cavity size was normal. Indeterminate diastolic  filling due to E-A fusion.  2. The right ventricle has normal systolic function. The cavity was  mildly enlarged. There is no increase in right ventricular wall thickness.  3. Right atrial size was mildly dilated.  4. The mitral valve is abnormal. Mild thickening of the mitral valve  leaflet.  5. The aortic valve is tricuspid. Mild thickening of the aortic valve.  Aortic valve regurgitation is mild by color flow Doppler.   Event monitor 03/31/2019: Study Highlights  1.  The  basic rhythm is normal sinus 2.  There are short supraventricular runs but no evidence of atrial fibrillation or flutter 3.  There are rare isolated PVCs 4.  There are no sustained arrhythmias or pathologic pauses    EKG:  EKG is not ordered today.    Recent Labs: 01/19/2019: B Natriuretic Peptide 583.4; TSH 1.006 01/21/2019: Magnesium 1.9 06/29/2019: ALT 18; Hemoglobin 12.3; Platelets 279.0 08/08/2019: BUN 19; Creatinine, Ser 0.82; Potassium 5.3; Sodium 138  Recent Lipid Panel    Component Value Date/Time   CHOL 237 (H) 06/29/2019 1214   TRIG 100.0 06/29/2019 1214   HDL 96.50 06/29/2019 1214   CHOLHDL 2 06/29/2019 1214   VLDL 20.0 06/29/2019 1214   LDLCALC 121 (H) 06/29/2019 1214    Physical Exam:    VS:  BP 128/64   Pulse 68   Ht 5\' 6"  (1.676 m)   Wt 190 lb (86.2 kg)   LMP  (LMP Unknown)   SpO2 96%   BMI 30.67 kg/m     Wt Readings from Last 3 Encounters:  11/04/19 190 lb (86.2 kg)  06/29/19 185 lb (83.9 kg)  06/07/19 185 lb (83.9 kg)     GEN:  Well nourished, well developed in no acute distress HEENT: Normal NECK: No JVD; No carotid bruits LYMPHATICS: No lymphadenopathy CARDIAC: RRR, no murmurs, rubs, gallops RESPIRATORY:  Clear to auscultation without rales, wheezing or rhonchi  ABDOMEN: Soft, non-tender, non-distended MUSCULOSKELETAL: 1+ bilateral  pretibial edema; No deformity  SKIN: Warm and dry NEUROLOGIC:  Alert and oriented x 3 PSYCHIATRIC:  Normal affect   ASSESSMENT:    1. Paroxysmal atrial fibrillation (HCC)   2. Aortic valve insufficiency, etiology of cardiac valve disease unspecified, mild   3. Chronic venous insufficiency    PLAN:    In order of problems listed above:  1. Maintaining sinus rhythm.  Continue beta-blockade.  Isolated episode in the setting of acute medical illness, no longer on oral anticoagulation. 2. The patient has had mild aortic valve insufficiency by serial echo studies.  She does not have an appreciable murmur on her exam.  She has no symptoms related to this.  We will follow clinically but I do not think she needs serial imaging at this point. 3. Discussed management strategies around her edema.  I gave her information on the lounge doctor leg elevation device.  I wrote her a prescription for Lasix 10 mg as needed.  This has worked well for her in the past.   Medication Adjustments/Labs and Tests Ordered: Current medicines are reviewed at length with the patient today.  Concerns regarding medicines are outlined above.  No orders of the defined types were placed in this encounter.  No orders of the defined types were placed in this encounter.   Patient Instructions  Medication Instructions:  1) START LASIX 15 mg once daily as needed for swelling *If you need a refill on your cardiac medications before your next appointment, please call your pharmacy*  Follow-Up: At [facility], you and your health needs are our priority.  As part of our continuing mission to provide you with exceptional heart care, we have created designated Provider Care Teams.  These Care Teams include your primary Cardiologist (physician) and Advanced Practice Providers (APPs -  Physician Assistants and Nurse Practitioners) who all work together to provide you with the care you need, when you need it. Your next  appointment:   12 month(s) The format for your next appointment:   In Person Provider:  You may see Sherren Mocha, MD or one of the following Advanced Practice Providers on your designated Care Team:    Richardson Dopp, PA-C  Vin [place], PA-C  Daune Perch, [place]    Signed, Sherren Mocha, MD  11/04/2019 3:27 PM    [place]

## 2019-11-04 NOTE — Patient Instructions (Addendum)
Medication Instructions:  1) START LASIX 15 mg once daily as needed for swelling. I will call you once I call [place] November. *If you need a refill on your cardiac medications before your next appointment, please call your pharmacy*  Follow-Up: At [facility], you and your health needs are our priority.  As part of our continuing mission to provide you with exceptional heart care, we have created designated Provider Care Teams.  These Care Teams include your primary Cardiologist (physician) and Advanced Practice Providers (APPs -  Physician Assistants and Nurse Practitioners) who all work together to provide you with the care you need, when you need it. Your next appointment:   12 month(s) The format for your next appointment:   In Person Provider:   You may see Sherren Mocha, MD or one of the following Advanced Practice Providers on your designated Care Team:    Richardson Dopp, PA-C  Vin [place], [place]  Daune Perch, [place]

## 2019-11-04 NOTE — Addendum Note (Signed)
Addended by: Harland German A on: 11/04/2019 05:43 PM   Modules accepted: Orders

## 2019-11-14 ENCOUNTER — Other Ambulatory Visit: Payer: Self-pay | Admitting: Family Medicine

## 2019-11-14 ENCOUNTER — Encounter: Payer: Self-pay | Admitting: Family Medicine

## 2019-11-14 DIAGNOSIS — L299 Pruritus, unspecified: Secondary | ICD-10-CM | POA: Insufficient documentation

## 2019-11-15 ENCOUNTER — Ambulatory Visit: Payer: Medicare Other | Admitting: Obstetrics & Gynecology

## 2019-11-15 DIAGNOSIS — N6320 Unspecified lump in the left breast, unspecified quadrant: Secondary | ICD-10-CM | POA: Diagnosis not present

## 2019-11-15 DIAGNOSIS — R928 Other abnormal and inconclusive findings on diagnostic imaging of breast: Secondary | ICD-10-CM | POA: Diagnosis not present

## 2019-11-18 ENCOUNTER — Telehealth: Payer: Self-pay | Admitting: *Deleted

## 2019-11-18 NOTE — Telephone Encounter (Signed)
Call to patient, left detailed message, ok per dpr. Advised of left breast biopsy results dated 11/15/19 per Dr. [name] Heck. Advised patient to return call to office if any additional questions.   11/15/19: MRI guided left breast biopsy at [facility], negative for malignancy.   Copy of report to scan.   Patient removed from IMG hold.   Routing to Dr. Lestine Box.   Encounter closed.

## 2019-11-24 ENCOUNTER — Other Ambulatory Visit: Payer: Self-pay | Admitting: Family Medicine

## 2019-11-24 NOTE — Telephone Encounter (Signed)
Last refill: 10/12/19 #60, 0 Last OV: 06/29/19 dx. HTN f/u

## 2019-12-06 ENCOUNTER — Telehealth (INDEPENDENT_AMBULATORY_CARE_PROVIDER_SITE_OTHER): Payer: Medicare Other | Admitting: Neurology

## 2019-12-06 ENCOUNTER — Encounter: Payer: Self-pay | Admitting: Neurology

## 2019-12-06 DIAGNOSIS — G609 Hereditary and idiopathic neuropathy, unspecified: Secondary | ICD-10-CM | POA: Diagnosis not present

## 2019-12-06 DIAGNOSIS — B0229 Other postherpetic nervous system involvement: Secondary | ICD-10-CM | POA: Diagnosis not present

## 2019-12-06 DIAGNOSIS — R269 Unspecified abnormalities of gait and mobility: Secondary | ICD-10-CM | POA: Diagnosis not present

## 2019-12-06 MED ORDER — AMITRIPTYLINE HCL 10 MG PO TABS: ORAL_TABLET | ORAL | 3 refills | Status: DC

## 2019-12-06 NOTE — Progress Notes (Signed)
   Virtual Visit via Video Note  I connected with Katrina Benitez on 12/06/19 at  9:30 AM EDT by a video enabled telemedicine application and verified that I am speaking with the correct person using two identifiers.  Location: Patient: The patient is at home. Provider: Physician in office.   I discussed the limitations of evaluation and management by telemedicine and the availability of in person appointments. The patient expressed understanding and agreed to proceed.  History of Present Illness:  Katrina Benitez is a 74 year old right-handed white female with a history of postherpetic neuralgia, but she also has a longstanding history of a peripheral neuropathy that was diagnosed in [place], [place].  The patient has been on chronic doses of gabapentin taking 600 mg 3 times daily.  In the past she had been on Cymbalta mainly for mood stabilization but she is not on this medication currently.  The patient has noted numbness, burning sensations, hot and cold sensations and lancinating pains in the feet.  She is not sleeping well because of this.  She does have some mild chronic issues with balance, she will fall or stumble on occasion.  She denies any involvement in the fingers.  She does have chronic low back pain as well.  The patient claims that she did have EMG and nerve conduction study evaluation in [place], I do not have the results of this study.    Observations/Objective: The video evaluation reveals that the patient is alert and cooperative.  She has normal speech pattern, symmetric face, full extraocular movements.  She has midline protrusion of the tongue with good lateral movement of the tongue.  The patient has good finger-nose-finger and heel shin bilaterally.  Gait is only minimally wide-based, tandem gait is slightly unsteady.  Romberg is negative but is slightly unsteady.  No drift is seen.  Assessment and Plan: 1.  Peripheral neuropathy  2.  Postherpetic  neuralgia  The patient is having increasing discomfort with her neuropathy, she will remain on the gabapentin and we will add amitriptyline to this, she has been on amitriptyline previously and has tolerated it well.  She will follow-up here in 4 or 5 months.  Follow Up Instructions: 23-month follow-up, may see nurse practitioner.    I discussed the assessment and treatment plan with the patient. The patient was provided an opportunity to ask questions and all were answered. The patient agreed with the plan and demonstrated an understanding of the instructions.   The patient was advised to call back or seek an in-person evaluation if the symptoms worsen or if the condition fails to improve as anticipated.  I provided 20 minutes of non-face-to-face time during this encounter.   Kathrynn Ducking, MD

## 2019-12-13 ENCOUNTER — Telehealth: Payer: Self-pay | Admitting: Neurology

## 2019-12-13 NOTE — Telephone Encounter (Signed)
-----   Message from Kathrynn Ducking, MD sent at 12/06/2019 10:08 AM EDT ----- May see nurse practitioner, 5-month follow-up.

## 2019-12-13 NOTE — Telephone Encounter (Signed)
I called patient and LVM to schedule a 4 month follow-up per Dr. Jannifer Franklin' note.

## 2019-12-14 ENCOUNTER — Telehealth: Payer: Self-pay | Admitting: Family Medicine

## 2019-12-14 NOTE — Telephone Encounter (Signed)
Left message for patient to call back and schedule Medicare Annual Wellness Visit (AWV) either virtually/audio only OR in office. Whatever the patients preference is.  Last AWV 08/12/18; please schedule at anytime with [facility]-Nurse Health Advisor at [facility]

## 2019-12-16 ENCOUNTER — Other Ambulatory Visit: Payer: Self-pay | Admitting: Family Medicine

## 2019-12-16 ENCOUNTER — Other Ambulatory Visit: Payer: Self-pay

## 2019-12-16 ENCOUNTER — Ambulatory Visit (INDEPENDENT_AMBULATORY_CARE_PROVIDER_SITE_OTHER): Payer: Medicare Other

## 2019-12-16 DIAGNOSIS — Z0000 Encounter for general adult medical examination without abnormal findings: Secondary | ICD-10-CM | POA: Diagnosis not present

## 2019-12-16 MED ORDER — BETAMETHASONE VALERATE 0.1 % EX OINT: 1.0000 "application " | TOPICAL_OINTMENT | Freq: Two times a day (BID) | CUTANEOUS | 1 refills | Status: DC

## 2019-12-16 NOTE — Progress Notes (Signed)
This visit is being conducted via phone call due to the COVID-19 pandemic. This patient has given me verbal consent via phone to conduct this visit, patient states they are participating from their home address. Some vital signs may be absent or patient reported.   Patient identification: identified by name, DOB, and current address.  Location provider: [facility], Office Persons participating in the virtual visit: Denman George LPN, patient, and Dr. Orma Flaming   Subjective:   Katrina Benitez is a 74 y.o. female who presents for Medicare Annual (Subsequent) preventive examination.  Review of Systems:   Cardiac Risk Factors include: advanced age (>47men, >55 women);dyslipidemia    Objective:     Vitals: LMP  (LMP Unknown)   There is no height or weight on file to calculate BMI.  Advanced Directives 12/16/2019 01/19/2019 01/22/2016  Does Patient Have a Medical Advance Directive? Yes No No  Type of Paramedic of [place];Living will - -  Does patient want to make changes to medical advance directive? No - Patient declined - -  Copy of [place] in Chart? No - copy requested - -  Would patient like information on creating a medical advance directive? - No - Patient declined No - patient declined information    Tobacco Social History   Tobacco Use  Smoking Status Never Smoker  Smokeless Tobacco Never Used     Counseling given: Not Answered   Clinical Intake:  Pre-visit preparation completed: Yes  Pain : No/denies pain  Diabetes: No  How often do you need to have someone help you when you read instructions, pamphlets, or other written materials from your doctor or pharmacy?: 1 - Never  Interpreter Needed?: No  Information entered by :: Denman George LPN  Past Medical History:  Diagnosis Date  . Allergy   . Anemia   . Anxiety   . Arthritis    degenerative disc disorder  . Cardiac arrhythmia   . Depression   .  Diverticulitis   . Diverticulosis   . GERD (gastroesophageal reflux disease)   . Heart murmur   . HTN (hypertension)   . Infertility, female   . Neuromuscular disorder ([place])    idiopathic peripheral  neuropathy  . Peripheral neuropathy   . Positive PPD   . SIADH (syndrome of inappropriate ADH production) ([place])    Past Surgical History:  Procedure Laterality Date  . BREAST BIOPSY Left   . BREAST EXCISIONAL BIOPSY Left    benign  . CERVICAL BIOPSY  W/ LOOP ELECTRODE EXCISION    . CERVICAL POLYPECTOMY  02/2016  . COLONOSCOPY    . COLPOSCOPY    . DILATION AND CURETTAGE OF UTERUS    . HYSTEROSCOPY    . LUMBAR DISC SURGERY    . LUMBAR LAMINECTOMY    . TONSILLECTOMY    . UPPER GASTROINTESTINAL ENDOSCOPY     Family History  Problem Relation Age of Onset  . Breast cancer Mother   . Glaucoma Mother   . Miscarriages / [place] Mother   . Heart attack Father   . Depression Father   . Early death Father   . Heart disease Father   . Tuberculosis Paternal Grandmother   . Prostate cancer Paternal Grandfather   . Depression Brother   . Hypertension Brother   . Colon cancer Neg Hx   . Esophageal cancer Neg Hx   . Rectal cancer Neg Hx   . Stomach cancer Neg Hx  Social History   Socioeconomic History  . Marital status: Married    Spouse name: Jenny Reichmann  . Number of children: 0  . Years of education: College  . Highest education level: Not on file  Occupational History  . Occupation: retired Engineer, drilling  Tobacco Use  . Smoking status: Never Smoker  . Smokeless tobacco: Never Used  Substance and Sexual Activity  . Alcohol use: Yes    Alcohol/week: 14.0 standard drinks    Types: 14 Glasses of wine per week  . Drug use: No  . Sexual activity: Not Currently    Birth control/protection: Post-menopausal  Other Topics Concern  . Not on file  Social History Narrative   Lives at home with her husband.   Right-handed.   1-2 cups caffeine per day.   Social Determinants of  Health   Financial Resource Strain:   . Difficulty of Paying Living Expenses:   Food Insecurity:   . Worried About Charity fundraiser in the Last Year:   . Arboriculturist in the Last Year:   Transportation Needs:   . Film/video editor (Medical):   Marland Kitchen Lack of Transportation (Non-Medical):   Physical Activity:   . Days of Exercise per Week:   . Minutes of Exercise per Session:   Stress:   . Feeling of Stress :   Social Connections:   . Frequency of Communication with Friends and Family:   . Frequency of Social Gatherings with Friends and Family:   . Attends Religious Services:   . Active Member of Clubs or Organizations:   . Attends Archivist Meetings:   Marland Kitchen Marital Status:     Outpatient Encounter Medications as of 12/16/2019  Medication Sig  . amitriptyline (ELAVIL) 10 MG tablet Take one tablet at night for one week, then take 2 tablets at night for one week, then take 3 tablets at night.  . cholecalciferol (D3 HIGH POTENCY) 10 MCG (400 UNIT) TABS tablet Take 400 Units by mouth.  . clobetasol (TEMOVATE) 0.05 % external solution APPLY TO AFFECTED AREA UPTO TWICE DAILY AS NEEDED (NOT TO FACE, GROIN, OR UNDERARMS)  . diazepam (VALIUM) 10 MG tablet TAKE ONE TABLET TWICE DAILY AS NEEDED FOR ANXIETY  . furosemide (LASIX) 20 MG tablet Take 0.5 tablets (10 mg total) by mouth daily as needed.  . gabapentin (NEURONTIN) 600 MG tablet TAKE ONE TABLET THREE TIMES DAILY  . levocetirizine (XYZAL) 5 MG tablet Take 5 mg by mouth at bedtime.  . metoprolol tartrate (LOPRESSOR) 25 MG tablet Take 1 tablet (25 mg total) by mouth 2 (two) times daily.  . Multiple Vitamins-Minerals (VITAMIN D3 COMPLETE PO) Take 4,000 Units by mouth.  Marland Kitchen omeprazole (PRILOSEC) 40 MG capsule Take 1 capsule (40 mg total) by mouth daily.  . ondansetron (ZOFRAN-ODT) 8 MG disintegrating tablet TAKE ONE TABLET EVERY EIGHT HOURS AS NEEDED FOR NAUSEA AND VOMITING  . Probiotic Product (ALIGN PO) Take by mouth daily.    . sodium chloride 1 g tablet Take 1 g by mouth daily.   Marland Kitchen triamcinolone cream (KENALOG) 0.1 % Apply topically 2 (two) times daily.  Marland Kitchen zolpidem (AMBIEN) 10 MG tablet TAKE ONE TABLET AT BEDTIME AS NEEDED  . augmented betamethasone dipropionate (DIPROLENE-AF) 0.05 % cream Apply topically 2 (two) times daily. (Patient not taking: Reported on 12/16/2019)   No facility-administered encounter medications on file as of 12/16/2019.    Activities of Daily Living In your present state of health, do you have any difficulty  performing the following activities: 12/16/2019 01/19/2019  Hearing? N N  Vision? N N  Difficulty concentrating or making decisions? N N  Walking or climbing stairs? Y N  Dressing or bathing? N N  Doing errands, shopping? N N  Preparing Food and eating ? N -  Using the Toilet? N -  In the past six months, have you accidently leaked urine? N -  Do you have problems with loss of bowel control? N -  Managing your Medications? N -  Managing your Finances? N -  Housekeeping or managing your Housekeeping? N -  Some recent data might be hidden    Patient Care Team: Orma Flaming, MD as PCP - General (Family Medicine) Sherren Mocha, MD as PCP - Cardiology (Cardiology) Sherren Mocha, MD as Consulting Physician (Cardiology) Irene Shipper, MD as Consulting Physician (Gastroenterology) Kathrynn Ducking, MD as Consulting Physician (Neurology) Megan Salon, MD as Consulting Physician (Gynecology) Lavonna Monarch, MD as Consulting Physician (Dermatology) Melissa Noon, [place] as Consulting Physician (Optometry)    Assessment:   This is a routine wellness examination for [facility].  Exercise Activities and Dietary recommendations Current Exercise Habits: The patient does not participate in regular exercise at present  Goals   None     Fall Risk Fall Risk  12/16/2019  Falls in the past year? 1  Number falls in past yr: 1  Injury with Fall? 0  Risk for fall due to : History of  fall(s);Impaired mobility  Follow up Education provided;Falls prevention discussed;Falls evaluation completed   Is the patient's home free of loose throw rugs in walkways, pet beds, electrical cords, etc?   yes      Grab bars in the bathroom? yes      Handrails on the stairs?   yes      Adequate lighting?   yes  Depression Screen PHQ 2/9 Scores 12/16/2019 06/29/2019 05/04/2019 02/18/2019  PHQ - 2 Score 0 0 2 4  PHQ- 9 Score - 5 6 8      Cognitive Function   6CIT Screen 12/16/2019  What Year? 0 points  What month? 0 points  What time? 0 points  Count back from 20 0 points  Months in reverse 0 points  Repeat phrase 0 points  Total Score 0    Immunization History  Administered Date(s) Administered  . Fluad Quad(high Dose 65+) 06/29/2019  . Influenza, High Dose Seasonal PF 08/12/2018  . PFIZER SARS-COV-2 Vaccination 10/14/2019, 11/03/2019  . Pneumococcal Conjugate-13 10/05/2014  . Pneumococcal Polysaccharide-23 09/01/2017  . Tdap 07/07/2015  . Zoster Recombinat (Shingrix) 05/28/2018, 08/12/2018    Qualifies for Shingles Vaccine? Shingrix completed  Screening Tests Health Maintenance  Topic Date Due  . MAMMOGRAM  03/27/2021  . TETANUS/TDAP  07/10/2025  . COLONOSCOPY  06/06/2029  . INFLUENZA VACCINE  Completed  . DEXA SCAN  Completed  . Hepatitis C Screening  Completed  . PNA vac Low Risk Adult  Completed    Cancer Screenings: Lung: Low Dose CT Chest recommended if Age 73-80 years, 30 pack-year currently smoking OR have quit w/in 15years. Patient does not qualify. Breast:  Up to date on Mammogram? Yes   Up to date of Bone Density/Dexa? Yes Colorectal: colonoscopy 06/07/19    Plan:  I have personally reviewed and addressed the Medicare Annual Wellness questionnaire and have noted the following in the patient's chart:  A. Medical and social history B. Use of alcohol, tobacco or illicit drugs  C. Current medications and supplements D. Functional  ability and status E.    Nutritional status F.  Physical activity G. Advance directives H. List of other physicians I.  Hospitalizations, surgeries, and ER visits in previous 12 months J.  [place] such as hearing and vision if needed, cognitive and depression L. Referrals, records requested, and appointments- none   In addition, I have reviewed and discussed with patient certain preventive protocols, quality metrics, and best practice recommendations. A written personalized care plan for preventive services as well as general preventive health recommendations were provided to patient.   Signed,  Denman George, LPN  Nurse Health Advisor   Nurse Notes: Patient is asking for a refill of betamethasone (valisone) instead of betamethasone dipropionate.  Please advise

## 2019-12-16 NOTE — Progress Notes (Signed)
Let her know I sent in valisone for her.  Happy easter,  Dr. Rogers Blocker

## 2019-12-16 NOTE — Patient Instructions (Addendum)
Katrina Benitez , Thank you for taking time to come for your Medicare Wellness Visit. I appreciate your ongoing commitment to your health goals. Please review the following plan we discussed and let me know if I can assist you in the future.   Screening recommendations/referrals: Colorectal Screening: up to date; last colonoscopy 06/07/19 Mammogram: up to date; last 03/28/19 with MR done 10/2019 Bone Density: up to date; last 03/28/19  Vision and Dental Exams: Recommended annual ophthalmology exams for early detection of glaucoma and other disorders of the eye Recommended annual dental exams for proper oral hygiene  Vaccinations: Influenza vaccine: completed 06/29/19 Pneumococcal vaccine: up to date; last 09/01/17 Tdap vaccine: up to date; last 07/07/15 Shingles vaccine:Shingrix completed  Covid vaccine: Completed   Advanced directives:Please bring a copy of your POA (Power of Attorney) and/or Living Will to your next appointment.  Goals: Recommend to drink at least 6-8 8oz glasses of water per day and consume a balanced diet rich in fresh fruits and vegetables.   Next appointment: Please schedule your Annual Wellness Visit with your Nurse Health Advisor in one year.  Preventive Care 74 Years and Older, Female Preventive care refers to lifestyle choices and visits with your health care provider that can promote health and wellness. What does preventive care include?  A yearly physical exam. This is also called an annual well check.  Dental exams once or twice a year.  Routine eye exams. Ask your health care provider how often you should have your eyes checked.  Personal lifestyle choices, including:  Daily care of your teeth and gums.  Regular physical activity.  Eating a healthy diet.  Avoiding tobacco and drug use.  Limiting alcohol use.  Practicing safe sex.  Taking low-dose aspirin every day if recommended by your health care provider.  Taking vitamin and mineral supplements  as recommended by your health care provider. What happens during an annual well check? The services and screenings done by your health care provider during your annual well check will depend on your age, overall health, lifestyle risk factors, and family history of disease. Counseling  Your health care provider may ask you questions about your:  Alcohol use.  Tobacco use.  Drug use.  Emotional well-being.  Home and relationship well-being.  Sexual activity.  Eating habits.  History of falls.  Memory and ability to understand (cognition).  Work and work Statistician.  Reproductive health. Screening  You may have the following tests or measurements:  Height, weight, and BMI.  Blood pressure.  Lipid and cholesterol levels. These may be checked every 5 years, or more frequently if you are over 41 years old.  Skin check.  Lung cancer screening. You may have this screening every year starting at age 12 if you have a 30-pack-year history of smoking and currently smoke or have quit within the past 15 years.  Fecal occult blood test (FOBT) of the stool. You may have this test every year starting at age 66.  Flexible sigmoidoscopy or colonoscopy. You may have a sigmoidoscopy every 5 years or a colonoscopy every 10 years starting at age 30.  Hepatitis C blood test.  Hepatitis B blood test.  Sexually transmitted disease (STD) testing.  Diabetes screening. This is done by checking your blood sugar (glucose) after you have not eaten for a while (fasting). You may have this done every 1-3 years.  Bone density scan. This is done to screen for osteoporosis. You may have this done starting at age 29.  Mammogram. This may be done every 1-2 years. Talk to your health care provider about how often you should have regular mammograms. Talk with your health care provider about your test results, treatment options, and if necessary, the need for more tests. Vaccines  Your health care  provider may recommend certain vaccines, such as:  Influenza vaccine. This is recommended every year.  Tetanus, diphtheria, and acellular pertussis (Tdap, Td) vaccine. You may need a Td booster every 10 years.  Zoster vaccine. You may need this after age 38.  Pneumococcal 13-valent conjugate (PCV13) vaccine. One dose is recommended after age 91.  Pneumococcal polysaccharide (PPSV23) vaccine. One dose is recommended after age 29. Talk to your health care provider about which screenings and vaccines you need and how often you need them. This information is not intended to replace advice given to you by your health care provider. Make sure you discuss any questions you have with your health care provider. Document Released: 10/05/2015 Document Revised: 05/28/2016 Document Reviewed: 07/10/2015 Elsevier Interactive Patient Education  2017 [employer] Prevention in the Home Falls can cause injuries. They can happen to people of all ages. There are many things you can do to make your home safe and to help prevent falls. What can I do on the outside of my home?  Regularly fix the edges of walkways and driveways and fix any cracks.  Remove anything that might make you trip as you walk through a door, such as a raised step or threshold.  Trim any bushes or trees on the path to your home.  Use bright outdoor lighting.  Clear any walking paths of anything that might make someone trip, such as rocks or tools.  Regularly check to see if handrails are loose or broken. Make sure that both sides of any steps have handrails.  Any raised decks and porches should have guardrails on the edges.  Have any leaves, snow, or ice cleared regularly.  Use sand or salt on walking paths during winter.  Clean up any spills in your garage right away. This includes oil or grease spills. What can I do in the bathroom?  Use night lights.  Install grab bars by the toilet and in the tub and shower. Do  not use towel bars as grab bars.  Use non-skid mats or decals in the tub or shower.  If you need to sit down in the shower, use a plastic, non-slip stool.  Keep the floor dry. Clean up any water that spills on the floor as soon as it happens.  Remove soap buildup in the tub or shower regularly.  Attach bath mats securely with double-sided non-slip rug tape.  Do not have throw rugs and other things on the floor that can make you trip. What can I do in the bedroom?  Use night lights.  Make sure that you have a light by your bed that is easy to reach.  Do not use any sheets or blankets that are too big for your bed. They should not hang down onto the floor.  Have a firm chair that has side arms. You can use this for support while you get dressed.  Do not have throw rugs and other things on the floor that can make you trip. What can I do in the kitchen?  Clean up any spills right away.  Avoid walking on wet floors.  Keep items that you use a lot in easy-to-reach places.  If you need to reach  something above you, use a strong step stool that has a grab bar.  Keep electrical cords out of the way.  Do not use floor polish or wax that makes floors slippery. If you must use wax, use non-skid floor wax.  Do not have throw rugs and other things on the floor that can make you trip. What can I do with my stairs?  Do not leave any items on the stairs.  Make sure that there are handrails on both sides of the stairs and use them. Fix handrails that are broken or loose. Make sure that handrails are as long as the stairways.  Check any carpeting to make sure that it is firmly attached to the stairs. Fix any carpet that is loose or worn.  Avoid having throw rugs at the top or bottom of the stairs. If you do have throw rugs, attach them to the floor with carpet tape.  Make sure that you have a light switch at the top of the stairs and the bottom of the stairs. If you do not have them,  ask someone to add them for you. What else can I do to help prevent falls?  Wear shoes that:  Do not have high heels.  Have rubber bottoms.  Are comfortable and fit you well.  Are closed at the toe. Do not wear sandals.  If you use a stepladder:  Make sure that it is fully opened. Do not climb a closed stepladder.  Make sure that both sides of the stepladder are locked into place.  Ask someone to hold it for you, if possible.  Clearly mark and make sure that you can see:  Any grab bars or handrails.  First and last steps.  Where the edge of each step is.  Use tools that help you move around (mobility aids) if they are needed. These include:  Canes.  Walkers.  Scooters.  Crutches.  Turn on the lights when you go into a dark area. Replace any light bulbs as soon as they burn out.  Set up your furniture so you have a clear path. Avoid moving your furniture around.  If any of your floors are uneven, fix them.  If there are any pets around you, be aware of where they are.  Review your medicines with your doctor. Some medicines can make you feel dizzy. This can increase your chance of falling. Ask your doctor what other things that you can do to help prevent falls. This information is not intended to replace advice given to you by your health care provider. Make sure you discuss any questions you have with your health care provider. Document Released: 07/05/2009 Document Revised: 02/14/2016 Document Reviewed: 10/13/2014 Elsevier Interactive Patient Education  2017 [employer].

## 2019-12-16 NOTE — Progress Notes (Signed)
Gave pt the message. She was very Patent attorney.

## 2019-12-28 ENCOUNTER — Ambulatory Visit: Payer: Medicare Other | Admitting: Family Medicine

## 2019-12-29 ENCOUNTER — Other Ambulatory Visit: Payer: Self-pay | Admitting: Family Medicine

## 2019-12-29 NOTE — Telephone Encounter (Signed)
Last refill: 3.4.21 #60,0 Last OV: 10.7.20 dx. Routine f/u

## 2020-01-06 ENCOUNTER — Telehealth: Payer: Self-pay | Admitting: Cardiovascular Disease

## 2020-01-06 NOTE — Telephone Encounter (Signed)
Pt c/o medication issue:  1. Name of Medication: furosemide (LASIX) 20 MG tablet  2. How are you currently taking this medication (dosage and times per day)? Stopped taking  3. Are you having a reaction (difficulty breathing--STAT)? no  4. What is your medication issue? Patient states she does not tolerate the medication. She states it just makes her feel bad even the next day and could not give any specific symptoms. She states she tried the smallest dose and still felt bad so she has stopped taking it. She also states she had discussed this with Dr. Burt Knack before. She would like to discuss trying thiazide instead.

## 2020-01-06 NOTE — Telephone Encounter (Signed)
I spoke to the patient who started Lasix on 11/04/19 and it has made her feel "systemically bad" and has not been taking it.  She would like to try something else and would like Dr Antionette Char advisement.  She just cannot tolerate it.  She denies SOB or weight gain, but does have minimal swelling.

## 2020-01-10 ENCOUNTER — Other Ambulatory Visit: Payer: Self-pay | Admitting: Family Medicine

## 2020-01-10 MED ORDER — HYDROCHLOROTHIAZIDE 12.5 MG PO CAPS: 12.5000 mg | ORAL_CAPSULE | ORAL | 3 refills | Status: DC

## 2020-01-10 NOTE — Telephone Encounter (Signed)
Could try HCTZ 12.5 mg every other day

## 2020-01-10 NOTE — Telephone Encounter (Signed)
The patient agrees to try HCTZ 12.5 mg every other day. Called in to preferred pharmacy.  She was grateful for call and agrees with treatment plan.

## 2020-01-12 ENCOUNTER — Ambulatory Visit (INDEPENDENT_AMBULATORY_CARE_PROVIDER_SITE_OTHER): Payer: Medicare Other | Admitting: Family Medicine

## 2020-01-12 ENCOUNTER — Encounter: Payer: Self-pay | Admitting: Family Medicine

## 2020-01-12 ENCOUNTER — Other Ambulatory Visit: Payer: Self-pay

## 2020-01-12 VITALS — BP 126/82 | HR 62 | Temp 97.0°F | Ht 66.0 in

## 2020-01-12 DIAGNOSIS — E559 Vitamin D deficiency, unspecified: Secondary | ICD-10-CM

## 2020-01-12 DIAGNOSIS — E782 Mixed hyperlipidemia: Secondary | ICD-10-CM | POA: Diagnosis not present

## 2020-01-12 DIAGNOSIS — E222 Syndrome of inappropriate secretion of antidiuretic hormone: Secondary | ICD-10-CM

## 2020-01-12 DIAGNOSIS — I10 Essential (primary) hypertension: Secondary | ICD-10-CM | POA: Diagnosis not present

## 2020-01-12 DIAGNOSIS — F339 Major depressive disorder, recurrent, unspecified: Secondary | ICD-10-CM

## 2020-01-12 LAB — COMPREHENSIVE METABOLIC PANEL
ALT: 18 U/L (ref 0–35)
AST: 19 U/L (ref 0–37)
Albumin: 4.4 g/dL (ref 3.5–5.2)
Alkaline Phosphatase: 117 U/L (ref 39–117)
BUN: 20 mg/dL (ref 6–23)
CO2: 30 mEq/L (ref 19–32)
Calcium: 9.4 mg/dL (ref 8.4–10.5)
Chloride: 92 mEq/L — ABNORMAL LOW (ref 96–112)
Creatinine, Ser: 0.93 mg/dL (ref 0.40–1.20)
GFR: 58.98 mL/min — ABNORMAL LOW (ref >60.00)
Glucose, Bld: 91 mg/dL (ref 70–99)
Potassium: 5.8 mEq/L — ABNORMAL HIGH (ref 3.5–5.1)
Sodium: 128 mEq/L — ABNORMAL LOW (ref 135–145)
Total Bilirubin: 0.3 mg/dL (ref 0.2–1.2)
Total Protein: 6.8 g/dL (ref 6.0–8.3)

## 2020-01-12 LAB — CBC WITH DIFFERENTIAL/PLATELET
Basophils Absolute: 0 10*3/uL (ref 0.0–0.1)
Basophils Relative: 0.7 % (ref 0.0–3.0)
Eosinophils Absolute: 0.3 10*3/uL (ref 0.0–0.7)
Eosinophils Relative: 4.7 % (ref 0.0–5.0)
HCT: 35.9 % — ABNORMAL LOW (ref 36.0–46.0)
Hemoglobin: 12.2 g/dL (ref 12.0–15.0)
Lymphocytes Relative: 27.3 % (ref 12.0–46.0)
Lymphs Abs: 1.6 10*3/uL (ref 0.7–4.0)
MCHC: 33.9 g/dL (ref 30.0–36.0)
MCV: 93.2 fl (ref 78.0–100.0)
Monocytes Absolute: 0.6 10*3/uL (ref 0.1–1.0)
Monocytes Relative: 10.2 % (ref 3.0–12.0)
Neutro Abs: 3.3 10*3/uL (ref 1.4–7.7)
Neutrophils Relative %: 57.1 % (ref 43.0–77.0)
Platelets: 298 10*3/uL (ref 150.0–400.0)
RBC: 3.86 Mil/uL — ABNORMAL LOW (ref 3.87–5.11)
RDW: 13.5 % (ref 11.5–15.5)
WBC: 5.7 10*3/uL (ref 4.0–10.5)

## 2020-01-12 LAB — VITAMIN D 25 HYDROXY (VIT D DEFICIENCY, FRACTURES): VITD: 47.47 ng/mL (ref 30.00–100.00)

## 2020-01-12 MED ORDER — BETAMETHASONE VALERATE 0.1 % EX CREA: TOPICAL_CREAM | Freq: Two times a day (BID) | CUTANEOUS | 1 refills | Status: DC

## 2020-01-12 NOTE — Progress Notes (Signed)
Patient: Katrina Benitez MRN: [id number] DOB: Oct 25, 1945 PCP: Orma Flaming, MD     Subjective:  Chief Complaint  Patient presents with  . Hypertension  . Depression  . SIADH    HPI: The patient is a 74 y.o. female who presents today for routine follow up.   Hypertension Here for follow up of hypertension.  Currently on lopressor bid and hctz 12.5mg  every other day.  Takes medication as prescribed and denies any side effects. Exercise includes none. Weight has been stable. Denies any chest pain, headaches, shortness of breath, vision changes, swelling in lower extremities. She has not picked up her hctz yet that was prescribed by her cardiologist.   Depression and anxiety: has been tried on multiple drugs. Stopped all of them. Hard with SIADH as well. She was on trintellix and thought this really helped her, but she stopped due to increased nausea. She is wanting to know if she should try again. She never started this back again.  She is currently on no medication for her depression/anxiety.   SIADH: monthly bmp. Due for this today. She has not had this checked in over 3 months. I had her start back on her salt tablets. She is currently taking 1g/day. Overall feels okay.   Vulvar pruritis: needs a cream instead of ointment of her steroid cream.    Has had her covid shots.   Review of Systems  Constitutional: Negative for chills, fatigue and fever.  HENT: Negative for dental problem, ear pain, hearing loss and trouble swallowing.   Eyes: Negative for visual disturbance.  Respiratory: Negative for cough, chest tightness and shortness of breath.   Cardiovascular: Negative for chest pain, palpitations and leg swelling.  Gastrointestinal: Negative for abdominal pain, blood in stool, diarrhea and nausea.  Endocrine: Negative for cold intolerance, polydipsia, polyphagia and polyuria.  Genitourinary: Negative for dysuria and hematuria.  Musculoskeletal: Positive for gait problem.  Negative for arthralgias.  Skin: Negative for rash.  Neurological: Negative for dizziness and headaches.       Neuropathy in her feet   Psychiatric/Behavioral: Negative for dysphoric mood and sleep disturbance. The patient is not nervous/anxious.     Allergies Patient is allergic to metronidazole; fentanyl; ciprofloxacin; dilaudid [hydromorphone hcl]; and visipaque [iodixanol].  Past Medical History Patient  has a past medical history of Allergy, Anemia, Anxiety, Arthritis, Cardiac arrhythmia, Depression, Diverticulitis, Diverticulosis, GERD (gastroesophageal reflux disease), Heart murmur, HTN (hypertension), Infertility, female, Neuromuscular disorder ([place]), Peripheral neuropathy, Positive PPD, and SIADH (syndrome of inappropriate ADH production) ([place]).  Surgical History Patient  has a past surgical history that includes Lumbar disc surgery; Lumbar laminectomy; Tonsillectomy; Colonoscopy; Upper gastrointestinal endoscopy; Cervical polypectomy (02/2016); Breast biopsy (Left); Breast excisional biopsy (Left); Colposcopy; Dilation and curettage of uterus; Hysteroscopy; and Cervical biopsy w/ loop electrode excision.  Family History Pateint's family history includes Breast cancer in her mother; Depression in her brother and father; Early death in her father; Glaucoma in her mother; Heart attack in her father; Heart disease in her father; Hypertension in her brother; Miscarriages / [place] in her mother; Prostate cancer in her paternal grandfather; Tuberculosis in her paternal grandmother.  Social History Patient  reports that she has never smoked. She has never used smokeless tobacco. She reports current alcohol use of about 14.0 standard drinks of alcohol per week. She reports that she does not use drugs.    Objective: Vitals:   01/12/20 1331  BP: 126/82  Pulse: 62  Temp: (!) 97 F (36.1 C)  TempSrc: Temporal  SpO2: 99%  Height: 5\' 6"  (1.676 m)    Body mass index is 30.67  kg/m.  Physical Exam Vitals reviewed.  Constitutional:      Appearance: Normal appearance. She is well-developed. She is obese.  HENT:     Head: Normocephalic and atraumatic.     Right Ear: Tympanic membrane, ear canal and external ear normal.     Left Ear: Tympanic membrane, ear canal and external ear normal.  Eyes:     Conjunctiva/sclera: Conjunctivae normal.     Pupils: Pupils are equal, round, and reactive to light.  Neck:     Thyroid: No thyromegaly.     Vascular: No carotid bruit.  Cardiovascular:     Rate and Rhythm: Normal rate and regular rhythm.     Heart sounds: Normal heart sounds. No murmur.  Pulmonary:     Effort: Pulmonary effort is normal.     Breath sounds: Normal breath sounds.  Abdominal:     General: Bowel sounds are normal. There is no distension.     Palpations: Abdomen is soft.     Tenderness: There is no abdominal tenderness.  Musculoskeletal:     Cervical back: Normal range of motion and neck supple.  Lymphadenopathy:     Cervical: No cervical adenopathy.  Skin:    General: Skin is warm and dry.     Capillary Refill: Capillary refill takes less than 2 seconds.     Findings: No rash.  Neurological:     General: No focal deficit present.     Mental Status: She is alert and oriented to person, place, and time.     Cranial Nerves: No cranial nerve deficit.     Coordination: Coordination normal.     Deep Tendon Reflexes: Reflexes normal.  Psychiatric:        Mood and Affect: Mood normal.        Behavior: Behavior normal.          Office Visit from 01/12/2020 in [place]  PHQ-9 Total Score  5     GAD 7 : Generalized Anxiety Score 06/29/2019 05/04/2019 02/18/2019  Nervous, Anxious, on Edge 1 2 2   Control/stop worrying 2 1 2   Worry too much - different things 2 1 0  Trouble relaxing 1 1 0  Restless 0 0 0  Easily annoyed or irritable 0 0 0  Afraid - awful might happen 0 1 0  Total GAD 7 Score 6 6 4   Anxiety Difficulty  Not difficult at all Not difficult at all Somewhat difficult     Assessment/plan: 1. Essential hypertension Blood pressure is to goal. Continue current anti-hypertensive medications. Refills not given and routine lab work will be done today. Recommended routine exercise and healthy diet including DASH diet and mediterranean diet. Encouraged weight loss. F/u in 6 months with fasting labs.   - CBC with Differential/Platelet - Comprehensive metabolic panel  2. SIADH (syndrome of inappropriate ADH production) ([place]) Due for recheck today. Continue sodium.   3. Depression, recurrent (HCC) phq9 score extremely mild off medication. She is doing well considering in pandemic and at home off medication. The trintellix made her nauseated so she stopped and didn't start back. Since doing so well off medication, will continue to monitor, but if she feels like depression is getting worse she is to let me know. F/u in 6 months.   4. Vitamin D deficiency  - VITAMIN D 25 Hydroxy (Vit-D Deficiency, Fractures)  5. Mixed hyperlipidemia Her ascvd risk  is elevated over 16%, but she tells me dr. Burt Knack didn't want to start a statin. Will email and ask to see, but she may be opposed to this regardless.    -f/u with neurology for neuropathy.    This visit occurred during the SARS-CoV-2 public health emergency.  Safety protocols were in place, including screening questions prior to the visit, additional usage of staff PPE, and extensive cleaning of exam room while observing appropriate contact time as indicated for disinfecting solutions.     Return in about 6 months (around 07/13/2020) for routine check up .   Orma Flaming, MD [place]   01/12/2020

## 2020-01-12 NOTE — Patient Instructions (Signed)
1) blood pressure is great.   2) routine lab work today for sodium/kidneys/vitamin D  3) sent in your steroid cream instead of ointment! Sorry!   4) for neuropathy-maybe start back the elavil at night that dr. Jannifer Franklin prescribed. Would f/u with him.   5) your depression score is really good. If doing well off medication we can stay the course, but let me know if you feel like you need medication and we can think through some things again.   See you in 6 months or sooner If needed!  Dr. Rogers Blocker

## 2020-01-13 ENCOUNTER — Other Ambulatory Visit: Payer: Self-pay | Admitting: Family Medicine

## 2020-01-13 ENCOUNTER — Telehealth: Payer: Self-pay

## 2020-01-13 ENCOUNTER — Telehealth: Payer: Self-pay | Admitting: Internal Medicine

## 2020-01-13 ENCOUNTER — Telehealth: Payer: Self-pay | Admitting: Family Medicine

## 2020-01-13 ENCOUNTER — Telehealth: Payer: Self-pay | Admitting: Physician Assistant

## 2020-01-13 DIAGNOSIS — E875 Hyperkalemia: Secondary | ICD-10-CM

## 2020-01-13 MED ORDER — SODIUM POLYSTYRENE SULFONATE PO POWD: Freq: Once | ORAL | 0 refills | Status: AC

## 2020-01-13 NOTE — Telephone Encounter (Signed)
Received a page from Katrina Benitez requesting to speak regarding her high potassium. It appears that her labs were ordered by her PCP and she has been contacted about this. Call returned but went straight to voicemail on multiple attempts.

## 2020-01-13 NOTE — Telephone Encounter (Signed)
Paged by answering service twice for high potassium. Lab done by PCP. Not on any medication that can increase her potassium however she is on HCTZ. Suspects endocrine disorders. K was high in past. I have called multiple times but call going straight and no voice mail has been step up.   Spoke with answering service who tried given number [phone] but unable to reach.   If patients call back again, answering service will get another number to reach patient and page [place] back again.

## 2020-01-13 NOTE — Telephone Encounter (Signed)
[facility] [place] Database Verified LR: 12-29-2019 Qty: 18 Last office visit: 01-12-2020 Upcoming appointment: No pending appt  [facility] [place] Database Verified LR: 10-12-2019 Qty: 90 Last office visit: 01-12-2020 Upcoming appointment: No pending appt

## 2020-01-13 NOTE — Telephone Encounter (Signed)
Patient returning missed call. 

## 2020-01-13 NOTE — Telephone Encounter (Signed)
I refilled valium 2 weeks ago. Too soon to get this. Please verify with pharmacy that they received this 2 weeks ago. Refilled her [place].  Thanks, Dr. Rogers Blocker

## 2020-01-13 NOTE — Telephone Encounter (Signed)
[facility] calling in regard to Katrina Benitez's concerns about hypeK+.  Order was already placed to re-check K+.  According to caller ,she wants to speak with PCP about elevated K+. Has had hyperkalemia for at least 6 months. Katrina Hobby states that she gets "in atrial fib very easy."  Lab Results  Component Value Date   CREATININE 0.93 01/12/2020   BUN 20 01/12/2020   NA 128 (L) 01/12/2020   K 5.8 No hemolysis seen (H) 01/12/2020   CL 92 (L) 01/12/2020   CO2 30 01/12/2020   Given her concerned about hyperK+ and possible complications + being Friday, I recommend going to the ER. Information will be conveyed to pt.  Naszir Cott [place], MD

## 2020-01-13 NOTE — Telephone Encounter (Signed)
Patient called back. She states that she has not been able to speak with anyone as she is having difficulties with her phone. PCP ordered BMP which resulted with a potassium of 5.8. 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 They are requesting a script for kayexalate which she has used in the past. Prescription sent to local [facility] per their request for one dose of kayexalate.

## 2020-01-16 ENCOUNTER — Other Ambulatory Visit (INDEPENDENT_AMBULATORY_CARE_PROVIDER_SITE_OTHER): Payer: Medicare Other

## 2020-01-16 ENCOUNTER — Telehealth: Payer: Self-pay | Admitting: Family Medicine

## 2020-01-16 ENCOUNTER — Other Ambulatory Visit: Payer: Self-pay | Admitting: Family Medicine

## 2020-01-16 DIAGNOSIS — E875 Hyperkalemia: Secondary | ICD-10-CM

## 2020-01-16 LAB — BASIC METABOLIC PANEL
BUN: 18 mg/dL (ref 6–23)
CO2: 30 mEq/L (ref 19–32)
Calcium: 9.3 mg/dL (ref 8.4–10.5)
Chloride: 98 mEq/L (ref 96–112)
Creatinine, Ser: 1 mg/dL (ref 0.40–1.20)
GFR: 54.24 mL/min — ABNORMAL LOW (ref >60.00)
Glucose, Bld: 85 mg/dL (ref 70–99)
Potassium: 4.9 mEq/L (ref 3.5–5.1)
Sodium: 133 mEq/L — ABNORMAL LOW (ref 135–145)

## 2020-01-16 NOTE — Progress Notes (Signed)
Hey thanks for reaching out. With an HDL of 96 and favorable ratio, I would probably not start her on a statin for primary prevention. It would be reasonable based on patient preference but I don't think she would be inclined to take it anyway.   Thx Ronalee Belts

## 2020-01-16 NOTE — Telephone Encounter (Signed)
I already ordered bmp to repeat to be done today. Just spoke with her and doctor on call gave her kayexalate over the weekend which she took. She is not having any cardiac symptoms.  She is repeating labs today. Also discussed that her sodium is low and would increase her sodium to 2g.  We will recheck bmp in 2 weeks as well. - Dr. Rogers Blocker

## 2020-01-16 NOTE — Telephone Encounter (Signed)
Chief Complaint Health information question (non symptomatic) Reason for Call Request to Speak to a Physician Initial Comment Caller says she has an abnormal lab value that her physician is concerned about it---says she would like to speak to the on call, says no symptoms but an ongoing conditions--says she can go into Atrial Fib at any moment Translation No Nurse Assessment Guidelines Guideline Title Affirmed Question Affirmed Notes Nurse Date/Time (Randall Time) Disp. Time Eilene Ghazi Time) Disposition Final User 01/13/2020 6:55:01 PM Attempt made - message left Wilder Glade 01/13/2020 6:55:18 PM Attempt made - message left Wilder Glade 01/13/2020 7:29:04 PM Called On-Call Provider Sharlett Iles, RN, Nehemiah Settle 01/13/2020 7:37:31 PM Clinical Call Yes Sharlett Iles, RN, Lewayne Bunting Disagree/Comply Disagree Caller Understands Yes PreDisposition Call Doctor Comments User: Delfino Lovett, RN Date/Time Eilene Ghazi Time): 01/13/2020 7:34:31 PM Spoke with Dr. [place] who is on call for the group. She said she on call is not for patients wishing to speak with patients but for legitimate emergencies. She said that caller could go to the ED if she experiences sx but otherwise could be seen in the office. User: Delfino Lovett, RN Date/Time Eilene Ghazi Time): 01/13/2020 7:35:50 PM Explained to caller that on call physician did not wish to speak with her and that she could be evaluated in the ED if sx worsen or she could wait and be seen in the office. Caller was very upset by this. She has no plans to go to the ED as it is generally very busy. User: Delfino Lovett, RN Date/Time Eilene Ghazi Time): 01/13/2020 7:37:14 PMPLEASE NOTE: All timestamps contained within this report are represented as [place] Standard Time. CONFIDENTIALTY NOTICE: This fax transmission is intended only for the addressee. It contains information that is legally privileged, confidential or otherwise protected from use or disclosure. If  you are not the intended recipient, you are strictly prohibited from reviewing, disclosing, copying using or disseminating any of this information or taking any action in reliance on or regarding this information. If you have received this fax in error, please notify us immediately by telephone so that we can arrange for its return to [place]. Phone: [phone], Toll-Free: [phone], Fax: [phone] Page: 2 of 2 Call Id: [id number] Comments Caller has no current symptoms of her Afib, she ony has concerns that she would go into Afib with her high potassium level. She says she needs medications. She was encouraged to go to the ED as per conversation with on call Dr. [place] Paging DoctorName Phone DateTime Result/Outcome Message Type Notes [place], Betty - MD [id number] 01/13/2020 7:29:04 PM Called On Call Provider - Reached Doctor Paged [place], Betty - MD 01/13/2020 7:32:32 PM Spoke with On Call - General Message Result She did not wish to speak with the patient. She said if she has any symptoms, she can be seen in the ED, otherwise she could wait to be seen in the offic

## 2020-01-21 ENCOUNTER — Other Ambulatory Visit: Payer: Self-pay | Admitting: Family Medicine

## 2020-01-26 ENCOUNTER — Other Ambulatory Visit: Payer: Self-pay | Admitting: Family Medicine

## 2020-01-26 NOTE — Telephone Encounter (Signed)
Last refill: 12/29/19 #60, 0 Last OV: 01/12/20 dx. HTN, depression

## 2020-01-27 ENCOUNTER — Other Ambulatory Visit (INDEPENDENT_AMBULATORY_CARE_PROVIDER_SITE_OTHER): Payer: Medicare Other

## 2020-01-27 DIAGNOSIS — E875 Hyperkalemia: Secondary | ICD-10-CM | POA: Diagnosis not present

## 2020-01-27 LAB — BASIC METABOLIC PANEL
BUN: 22 mg/dL (ref 6–23)
CO2: 30 mEq/L (ref 19–32)
Calcium: 9.2 mg/dL (ref 8.4–10.5)
Chloride: 95 mEq/L — ABNORMAL LOW (ref 96–112)
Creatinine, Ser: 0.9 mg/dL (ref 0.40–1.20)
GFR: 61.25 mL/min (ref >60.00)
Glucose, Bld: 85 mg/dL (ref 70–99)
Potassium: 4.4 mEq/L (ref 3.5–5.1)
Sodium: 129 mEq/L — ABNORMAL LOW (ref 135–145)

## 2020-02-08 ENCOUNTER — Telehealth: Payer: Self-pay | Admitting: Cardiovascular Disease

## 2020-02-08 NOTE — Telephone Encounter (Signed)
The patient reports she has been dealing with several effects from metoprolol and wants to change her medication. She complains that she is loopy, dizzy, lethargic, and is always tired but doesn't sleep well at night. She said her husband thinks she has had personality changes since being on the medication. She is also experiencing some swelling in her feet. Today, her BP is 103/53 and her HR 67.  She stopped her HCTZ because her sodium was low.  She is on vacation. Reiterated to her to elevate her legs when sitting and to limit salt in her diet.   She understands she will be called with Dr. Antionette Char medication recommendations.

## 2020-02-08 NOTE — Telephone Encounter (Signed)
I reviewed her chart.  Back in 2017 she was on metoprolol succinate 25 mg daily.  Options would be to return to this, or to wean off metoprolol altogether.  If she decides to change to metoprolol succinate, would start 25 mg once daily.  If she decides to wean off of metoprolol, would wean to once daily for 1 week then stop.

## 2020-02-08 NOTE — Telephone Encounter (Signed)
Pt c/o medication issue:  1. Name of Medication: metoprolol tartrate (LOPRESSOR) 25 MG tablet  2. How are you currently taking this medication (dosage and times per day)? Take 1 tablet (25 mg total) by mouth 2 (two) times daily.  3. Are you having a reaction (difficulty breathing--STAT)?   4. What is your medication issue? She states she is having swelling, she is goofy from it, losing her balance, very sleepy but not sleeping at night, can't think straight. She wants to know if it can be changed to something else.

## 2020-02-09 NOTE — Telephone Encounter (Signed)
Reviewed options with patient. She will wean herself off metoprolol. She understands to decrease metoprolol to once daily then STOP. She will monitor BP and HR and call if they increase. She was grateful for assistance.  Med list updated.

## 2020-02-13 DIAGNOSIS — M79675 Pain in left toe(s): Secondary | ICD-10-CM | POA: Diagnosis not present

## 2020-02-13 DIAGNOSIS — M79671 Pain in right foot: Secondary | ICD-10-CM | POA: Diagnosis not present

## 2020-02-13 DIAGNOSIS — M25571 Pain in right ankle and joints of right foot: Secondary | ICD-10-CM | POA: Diagnosis not present

## 2020-02-13 DIAGNOSIS — S82831A Other fracture of upper and lower end of right fibula, initial encounter for closed fracture: Secondary | ICD-10-CM | POA: Diagnosis not present

## 2020-02-16 ENCOUNTER — Telehealth: Payer: Self-pay | Admitting: Family Medicine

## 2020-02-16 NOTE — Telephone Encounter (Signed)
Chief Complaint Vomiting Reason for Call Symptomatic / Request for [place] states that she says her sodium is falling. She states that she vomits whenever she takes the sodium. This has been going on for 2 weeks. She broke ankle a few days ago and she has been treated for this. Translation No Nurse Assessment Nurse: Hassell Done, RN, Melanie Date/Time (Eastern Time): 02/15/2020 4:37:12 PM Confirm and document reason for call. If symptomatic, describe symptoms. ---Caller states she has syndrome of inappropriate ADH. Causes her to lose sodium. Was at 1 point taking 3 grams of sodium daily. now is taking one gram a day. Cant keep the sodium down. Throws it up. Had sodium checked last some time ago. Sodium was 129 last time Has the patient had close contact with a person known or suspected to have the novel coronavirus illness OR traveled / lives in area with major community spread (including international travel) in the last 14 days from the onset of symptoms? * If Asymptomatic, screen for exposure and travel within the last 14 days. ---No Does the patient have any new or worsening symptoms? ---Yes Will a triage be completed? ---Yes Related visit to physician within the last 2 weeks? ---Yes Does the PT have any chronic conditions? (i.e. diabetes, asthma, this includes High risk factors for pregnancy, etc.) ---Yes List chronic conditions. ---low sodium Is this a behavioral health or substance abuse call? ---NoPLEASE NOTE: All timestamps contained within this report are represented as [place] Standard Time. CONFIDENTIALTY NOTICE: This fax transmission is intended only for the addressee. It contains information that is legally privileged, confidential or otherwise protected from use or disclosure. If you are not the intended recipient, you are strictly prohibited from reviewing, disclosing, copying using or disseminating any of this information or taking  any action in reliance on or regarding this information. If you have received this fax in error, please notify us immediately by telephone so that we can arrange for its return to [place]. Phone: [phone], Toll-Free: [phone], Fax: [phone] Page: 2 of 2 Call Id: [id number] Guidelines Guideline Title Affirmed Question Affirmed Notes Nurse Date/Time Eilene Ghazi Time) Vomiting Vomiting a prescription medication Arnaldo Natal 02/15/2020 4:42:31 PM Disp. Time Eilene Ghazi Time) Disposition Final User 02/15/2020 4:52:39 PM Call PCP within 24 Hours Yes Hassell Done, RN, Threasa Beards Caller Disagree/Comply Comply Caller Understands Yes PreDisposition Call Doctor

## 2020-02-22 DIAGNOSIS — S8264XD Nondisplaced fracture of lateral malleolus of right fibula, subsequent encounter for closed fracture with routine healing: Secondary | ICD-10-CM | POA: Diagnosis not present

## 2020-02-22 DIAGNOSIS — M25571 Pain in right ankle and joints of right foot: Secondary | ICD-10-CM | POA: Diagnosis not present

## 2020-02-23 ENCOUNTER — Other Ambulatory Visit: Payer: Self-pay | Admitting: Family Medicine

## 2020-02-23 NOTE — Telephone Encounter (Signed)
Last refill: 01/26/20 #60, 0 Last OV: 01/12/20 dx HTN, depression

## 2020-03-09 ENCOUNTER — Encounter: Payer: Self-pay | Admitting: Family Medicine

## 2020-03-09 ENCOUNTER — Other Ambulatory Visit: Payer: Self-pay

## 2020-03-09 ENCOUNTER — Other Ambulatory Visit (INDEPENDENT_AMBULATORY_CARE_PROVIDER_SITE_OTHER): Payer: Medicare Other

## 2020-03-09 ENCOUNTER — Telehealth (INDEPENDENT_AMBULATORY_CARE_PROVIDER_SITE_OTHER): Payer: Medicare Other | Admitting: Family Medicine

## 2020-03-09 VITALS — Ht 66.0 in | Wt 190.0 lb

## 2020-03-09 DIAGNOSIS — E222 Syndrome of inappropriate secretion of antidiuretic hormone: Secondary | ICD-10-CM

## 2020-03-09 DIAGNOSIS — F419 Anxiety disorder, unspecified: Secondary | ICD-10-CM | POA: Diagnosis not present

## 2020-03-09 LAB — BASIC METABOLIC PANEL
BUN: 17 mg/dL (ref 6–23)
CO2: 28 mEq/L (ref 19–32)
Calcium: 9.2 mg/dL (ref 8.4–10.5)
Chloride: 100 mEq/L (ref 96–112)
Creatinine, Ser: 0.89 mg/dL (ref 0.40–1.20)
GFR: 62.02 mL/min (ref >60.00)
Glucose, Bld: 88 mg/dL (ref 70–99)
Potassium: 4.5 mEq/L (ref 3.5–5.1)
Sodium: 133 mEq/L — ABNORMAL LOW (ref 135–145)

## 2020-03-09 MED ORDER — BUSPIRONE HCL 7.5 MG PO TABS: 7.5000 mg | ORAL_TABLET | Freq: Three times a day (TID) | ORAL | 0 refills | Status: DC

## 2020-03-09 NOTE — Patient Instructions (Addendum)
For anxiety 1) starting new drug called buspar. Does not cause low sodium. Im going to start you out at 7.5mg . you can take this up to two to three times a day. I want to see you back in one month. Can make you a little drowsy so just watch this.   2) go get sodium today. I put in standing order for repeat bmps every 30 days.   3) try to fluid restrict less than 1L a day, which I know is so hard.

## 2020-03-09 NOTE — Progress Notes (Signed)
. Patient: Katrina Benitez MRN: [id number] DOB: [birth year] PCP: Orma Flaming, MD     I connected with [name] on 03/09/20 at 3:02pm by a video enabled telemedicine application and verified that I am speaking with the correct person using two identifiers.  Location patient: Home Location provider: [facility], Office Persons participating in this virtual visit: [name] and Dr. Rogers Blocker   I discussed the limitations of evaluation and management by telemedicine and the availability of in person appointments. The patient expressed understanding and agreed to proceed.   Subjective:  Chief Complaint  Patient presents with  . Anxiety  . hyponatremia    HPI: The patient is a 74 y.o. female who presents today for Depression/anxiety. She tells me her husband called to make the appointment today and thinks she needs to be on something. she states she is not depressed, more overwhelmed and anxious.  She was on trintellix and stopped this due to nausea. I saw her back in April and her phq9 score was mild and she was doing well off medication. I asked her to reach out if she felt like her depression was getting worse. Has tried celexa, but stopped this. Has also tried cymbalta. She possibly has been on  wellbutrin.   She is overwhelmed with life. All of the electronic equipment she has to figure out. She has a new modem she is trying to figure out and she has a new phone. She is trying to do taxes and she is still unhappy here and not moved into her house fully. She states she feels more anxiety and not depression.   Hyponatremia due to SIADH -standing order no longer in chart. Likely expired. Needs this checked as she is worried about her sodium. Taking salt tablets, but causing her to be nauseated and now throwing up at times. States she does limit fluids.   Review of Systems  Constitutional: Negative for fatigue and fever.  Respiratory: Negative for cough and shortness of breath.    Cardiovascular: Negative for chest pain, palpitations and leg swelling.  Gastrointestinal: Negative for abdominal pain, diarrhea and vomiting.  Psychiatric/Behavioral: Negative for dysphoric mood. The patient is nervous/anxious.     Allergies Patient is allergic to metronidazole, fentanyl, ciprofloxacin, dilaudid [hydromorphone hcl], and visipaque [iodixanol].  Past Medical History Patient  has a past medical history of Allergy, Anemia, Anxiety, Arthritis, Cardiac arrhythmia, Depression, Diverticulitis, Diverticulosis, GERD (gastroesophageal reflux disease), Heart murmur, HTN (hypertension), Infertility, female, Neuromuscular disorder ([place]), Peripheral neuropathy, Positive PPD, and SIADH (syndrome of inappropriate ADH production) ([place]).  Surgical History Patient  has a past surgical history that includes Lumbar disc surgery; Lumbar laminectomy; Tonsillectomy; Colonoscopy; Upper gastrointestinal endoscopy; Cervical polypectomy (02/2016); Breast biopsy (Left); Breast excisional biopsy (Left); Colposcopy; Dilation and curettage of uterus; Hysteroscopy; and Cervical biopsy w/ loop electrode excision.  Family History Pateint's family history includes Breast cancer in her mother; Depression in her brother and father; Early death in her father; Glaucoma in her mother; Heart attack in her father; Heart disease in her father; Hypertension in her brother; Miscarriages / [place] in her mother; Prostate cancer in her paternal grandfather; Tuberculosis in her paternal grandmother.  Social History Patient  reports that she has never smoked. She has never used smokeless tobacco. She reports current alcohol use of about 14.0 standard drinks of alcohol per week. She reports that she does not use drugs.    Objective: Vitals:   03/09/20 1441  Weight: 190 lb (86.2 kg)  Height: 5'  6" (1.676 m)    Body mass index is 30.67 kg/m.  Physical Exam Vitals reviewed.  Constitutional:      Appearance: Normal  appearance.  HENT:     Head: Normocephalic and atraumatic.  Pulmonary:     Effort: Pulmonary effort is normal.  Neurological:     General: No focal deficit present.     Mental Status: She is alert and oriented to person, place, and time.  Psychiatric:        Mood and Affect: Mood normal.        Behavior: Behavior normal.           Video Visit from 03/09/2020 in [place]  PHQ-9 Total Score 7     GAD 7 : Generalized Anxiety Score 03/09/2020 06/29/2019 05/04/2019 02/18/2019  Nervous, Anxious, on Edge 2 1 2 2   Control/stop worrying 3 2 1 2   Worry too much - different things 3 2 1  0  Trouble relaxing 0 1 1 0  Restless 0 0 0 0  Easily annoyed or irritable 1 0 0 0  Afraid - awful might happen 0 0 1 0  Total GAD 7 Score 9 6 6 4   Anxiety Difficulty - Not difficult at all Not difficult at all Somewhat difficult      Assessment/plan: 1. SIADH (syndrome of inappropriate ADH production) (H[facility]) Put standing order back in for her monthly bmps. Will go today for this. Discussed fluid restriction to less than 823mL, continued salt tablet and discussed possibly adding on loop diuretic with sodium tablet, but she is against this. If we can't get her to tolerate oral sodium, will put in referral to specialist.  - Basic metabolic panel; Standing  2. Anxiety Has not been able to tolerate typical SSRIs, possibly wellbutrin or trintellix. Does not want to do an atypical like abilify. We are going to try buspar since no hyponatremia, but again discussed nausea may be a side effect. Dicussed how to take this drug. F/u in 1 month. I think counseling would be so beneficial for her as well, but she tells me she is uncounselable. If this fails, I will send to psychiatry.     Return in about 1 month (around 04/08/2020) for anxiety follow up .  Records requested if needed. Time spent with patient: 50 minutes, of which >50% was spent in obtaining information about her symptoms,  reviweing her previous labs, evaluations, and treatments, counseling her about her conditions (please see discussed topics above), and developing a plan to further investigate it; she had a number of questions which I addressed.    Orma Flaming, MD [place]  03/09/2020

## 2020-03-16 ENCOUNTER — Other Ambulatory Visit: Payer: Self-pay | Admitting: Family Medicine

## 2020-03-16 NOTE — Telephone Encounter (Signed)
Last refill: 02/23/20 #60, 0 Last OV: 02/21/20 dx. SIADH

## 2020-03-21 ENCOUNTER — Encounter: Payer: Self-pay | Admitting: Family Medicine

## 2020-03-21 ENCOUNTER — Telehealth (INDEPENDENT_AMBULATORY_CARE_PROVIDER_SITE_OTHER): Payer: Medicare Other | Admitting: Family Medicine

## 2020-03-21 ENCOUNTER — Other Ambulatory Visit: Payer: Self-pay

## 2020-03-21 VITALS — Ht 66.0 in | Wt 190.0 lb

## 2020-03-21 DIAGNOSIS — F339 Major depressive disorder, recurrent, unspecified: Secondary | ICD-10-CM

## 2020-03-21 MED ORDER — HYDROXYZINE PAMOATE 25 MG PO CAPS: 25.0000 mg | ORAL_CAPSULE | Freq: Three times a day (TID) | ORAL | 1 refills | Status: DC | PRN

## 2020-03-21 MED ORDER — BUPROPION HCL ER (XL) 150 MG PO TB24: 150.0000 mg | ORAL_TABLET | Freq: Every day | ORAL | 1 refills | Status: DC

## 2020-03-21 NOTE — Patient Instructions (Signed)
We are going to start you on wellbutrin for depression. I do agree you need something and prefer we do this as it's side effect panel is safer than the other drug we discussed. You will take one pill/day. If any issues you can just stop this, but if you like this we can increase dosage to 300mg  if needed. Does have weight loss component which you may like.

## 2020-03-21 NOTE — Progress Notes (Signed)
Patient: Katrina Benitez MRN: [id number] DOB: 12-26-1945 PCP: Orma Flaming, MD     I connected with [name] on 03/21/20 at 11:45am by a video enabled telemedicine application and verified that I am speaking with the correct person using two identifiers.  Location patient: Home Location provider: [facility], Office Persons participating in this virtual visit: [place] and her husband, Dr. Jenny Reichmann Hofman.   I discussed the limitations of evaluation and management by telemedicine and the availability of in person appointments. The patient expressed understanding and agreed to proceed.   Subjective:  Chief Complaint  Patient presents with  . Depression    Per Husband    HPI: The patient is a 74 y.o. female who presents today for depression per husband. Pt's husband says that she has inability to enjoy life, lack of enthusiasm, she tends to look at the future in a negative way. And she does not have a happy outlook on life. He says she will cry over the simplest things, even they don't involve her.   I just saw her and she thought she was more anxious. Her husband states he first noticed anhedonia about 7-8 years ago. She doesn't feel this way and thought it was more anxiety. She is not happy here in [place] and thought she would have more time with her husband. She is lonely and out of her known places. It's hard getting old. She feels like she needs a doctor for everything. She feels like it's so hard to do here. Not taking buspar.   Review of Systems  Respiratory: Negative for shortness of breath and wheezing.   Cardiovascular: Negative for chest pain and palpitations.  Neurological: Negative for dizziness and headaches.  Psychiatric/Behavioral: Negative for sleep disturbance and suicidal ideas.    Allergies Patient is allergic to metronidazole, fentanyl, ciprofloxacin, dilaudid [hydromorphone hcl], and visipaque [iodixanol].  Past Medical History Patient  has a past  medical history of Allergy, Anemia, Anxiety, Arthritis, Cardiac arrhythmia, Depression, Diverticulitis, Diverticulosis, GERD (gastroesophageal reflux disease), Heart murmur, HTN (hypertension), Infertility, female, Neuromuscular disorder ([place]), Peripheral neuropathy, Positive PPD, and SIADH (syndrome of inappropriate ADH production) ([place]).  Surgical History Patient  has a past surgical history that includes Lumbar disc surgery; Lumbar laminectomy; Tonsillectomy; Colonoscopy; Upper gastrointestinal endoscopy; Cervical polypectomy (02/2016); Breast biopsy (Left); Breast excisional biopsy (Left); Colposcopy; Dilation and curettage of uterus; Hysteroscopy; and Cervical biopsy w/ loop electrode excision.  Family History  Pateint's family history includes Breast cancer in her mother; Depression in her brother and father; Early death in her father; Glaucoma in her mother; Heart attack in her father; Heart disease in her father; Hypertension in her brother; Miscarriages / [place] in her mother; Prostate cancer in her paternal grandfather; Tuberculosis in her paternal grandmother.  Social History Patient  reports that she has never smoked. She has never used smokeless tobacco. She reports current alcohol use of about 14.0 standard drinks of alcohol per week. She reports that she does not use drugs.    Objective: Vitals:   03/21/20 1114  Weight: 190 lb (86.2 kg)  Height: 5\' 6"  (1.676 m)    Body mass index is 30.67 kg/m.  Physical Exam Vitals reviewed.  Constitutional:      Appearance: Normal appearance.  HENT:     Head: Normocephalic and atraumatic.  Pulmonary:     Effort: Pulmonary effort is normal.  Neurological:     General: No focal deficit present.     Mental Status: She is alert and  oriented to person, place, and time.  Psychiatric:     Comments: Tearful during conversation           Video Visit from 03/21/2020 in [place]  PHQ-9 Total Score 7       Assessment/plan: 1. Depression, recurrent ([place]) Very difficult case with SIADH. Failed trintellix with side effect of nausea. We are going to try her on wellbutrin and see how she does wit this. Did discuss it may make her anxiety worse, but I feel like her depression really needs to be treated and will benefit her the most if we can get this controlled. She has no history of seizures. Will have close f/u of her electrolytes as well. Any issues she is to call me, otherwise f/u in 1 month.    Return in about 1 month (around 04/20/2020) for depression/medication recheck .  Records requested if needed. Time spent with patient: 45 minutes, of which >50% was spent in obtaining information about her symptoms, reviweing her previous labs, evaluations, and treatments, counseling her about her conditions (please see discussed topics above), and developing a plan to further investigate it; she had a number of questions which I addressed.    Orma Flaming, MD [place]  03/21/2020

## 2020-03-29 ENCOUNTER — Encounter: Payer: Self-pay | Admitting: Family Medicine

## 2020-04-06 ENCOUNTER — Other Ambulatory Visit: Payer: Self-pay | Admitting: Family Medicine

## 2020-04-06 MED ORDER — BACLOFEN 10 MG PO TABS: 10.0000 mg | ORAL_TABLET | Freq: Three times a day (TID) | ORAL | 0 refills | Status: DC

## 2020-04-09 ENCOUNTER — Ambulatory Visit: Payer: Medicare Other | Admitting: Obstetrics & Gynecology

## 2020-04-11 ENCOUNTER — Other Ambulatory Visit (INDEPENDENT_AMBULATORY_CARE_PROVIDER_SITE_OTHER): Payer: Medicare Other

## 2020-04-11 DIAGNOSIS — M79671 Pain in right foot: Secondary | ICD-10-CM | POA: Diagnosis not present

## 2020-04-11 DIAGNOSIS — M79672 Pain in left foot: Secondary | ICD-10-CM | POA: Diagnosis not present

## 2020-04-11 DIAGNOSIS — E222 Syndrome of inappropriate secretion of antidiuretic hormone: Secondary | ICD-10-CM | POA: Diagnosis not present

## 2020-04-11 DIAGNOSIS — S8264XD Nondisplaced fracture of lateral malleolus of right fibula, subsequent encounter for closed fracture with routine healing: Secondary | ICD-10-CM | POA: Diagnosis not present

## 2020-04-11 LAB — BASIC METABOLIC PANEL
BUN: 13 mg/dL (ref 6–23)
CO2: 26 mEq/L (ref 19–32)
Calcium: 9.3 mg/dL (ref 8.4–10.5)
Chloride: 94 mEq/L — ABNORMAL LOW (ref 96–112)
Creatinine, Ser: 1.08 mg/dL (ref 0.40–1.20)
GFR: 49.6 mL/min — ABNORMAL LOW (ref >60.00)
Glucose, Bld: 100 mg/dL — ABNORMAL HIGH (ref 70–99)
Potassium: 4.6 mEq/L (ref 3.5–5.1)
Sodium: 128 mEq/L — ABNORMAL LOW (ref 135–145)

## 2020-04-12 ENCOUNTER — Other Ambulatory Visit: Payer: Self-pay | Admitting: Family Medicine

## 2020-04-18 ENCOUNTER — Other Ambulatory Visit: Payer: Self-pay | Admitting: Family Medicine

## 2020-04-18 NOTE — Telephone Encounter (Signed)
Last refill: 03/16/20 #60, 0 Last OV: 03/21/20 dx. Depression

## 2020-05-03 ENCOUNTER — Telehealth: Payer: Self-pay | Admitting: Family Medicine

## 2020-05-03 NOTE — Telephone Encounter (Signed)
Patient calling in and stated she has some questions about her medications. Patient would like a call back.

## 2020-05-04 NOTE — Telephone Encounter (Signed)
Patient requesting stronger dose of Wellbutrin XR. Patient wanted to speak directly to Dr. Rogers Blocker. Informed patient PCP was out of the office today. Patient became upset, stating this is a reoccurring issue - that her PCP is never there when she needs her. Requested Dr. Shelby Mattocks schedule, informed patient that PCP is in the office everyday 8-2:30 and off all day Tuesday, but that Dr. Rogers Blocker is entitled to days off. Informed patient that PCP will responded with next steps regarding medication and told patient to have a great afternoon.

## 2020-05-07 MED ORDER — BUPROPION HCL ER (XL) 300 MG PO TB24: 300.0000 mg | ORAL_TABLET | Freq: Every day | ORAL | 1 refills | Status: DC

## 2020-05-07 NOTE — Telephone Encounter (Signed)
Called and discussed with patient that she was supposed to follow up in one month for recheck of medication and has failed to do this. I can not manage without follow up. She has had more stresses, family stuff, medical health, etc.  I will increase her to 300mg , but we really need to watch her sodium closely as it did drop some and could be related to medication.   -due for sodium check in 5 days.  -continue salt tablets.   If sodium is lower we will have to stop this. She understands this.   Dr. Rogers Blocker

## 2020-05-08 ENCOUNTER — Other Ambulatory Visit: Payer: Self-pay | Admitting: Family Medicine

## 2020-05-08 ENCOUNTER — Telehealth: Payer: Self-pay

## 2020-05-08 NOTE — Telephone Encounter (Signed)
Sent to pharmacy 

## 2020-05-08 NOTE — Telephone Encounter (Signed)
Pt is wanting another jar of Triamcinolone sent in to [facility]. She is very adamant that she get this as soon as possible.

## 2020-05-10 DIAGNOSIS — M1711 Unilateral primary osteoarthritis, right knee: Secondary | ICD-10-CM | POA: Diagnosis not present

## 2020-05-14 ENCOUNTER — Other Ambulatory Visit: Payer: Self-pay | Admitting: Internal Medicine

## 2020-05-14 ENCOUNTER — Other Ambulatory Visit: Payer: Self-pay | Admitting: Family Medicine

## 2020-05-14 ENCOUNTER — Other Ambulatory Visit (INDEPENDENT_AMBULATORY_CARE_PROVIDER_SITE_OTHER): Payer: Medicare Other

## 2020-05-14 DIAGNOSIS — E222 Syndrome of inappropriate secretion of antidiuretic hormone: Secondary | ICD-10-CM | POA: Diagnosis not present

## 2020-05-14 LAB — BASIC METABOLIC PANEL
BUN: 19 mg/dL (ref 6–23)
CO2: 25 mEq/L (ref 19–32)
Calcium: 9.6 mg/dL (ref 8.4–10.5)
Chloride: 97 mEq/L (ref 96–112)
Creatinine, Ser: 1.01 mg/dL (ref 0.40–1.20)
GFR: 53.57 mL/min — ABNORMAL LOW (ref >60.00)
Glucose, Bld: 96 mg/dL (ref 70–99)
Potassium: 4.6 mEq/L (ref 3.5–5.1)
Sodium: 132 mEq/L — ABNORMAL LOW (ref 135–145)

## 2020-05-16 ENCOUNTER — Telehealth: Payer: Self-pay | Admitting: Internal Medicine

## 2020-05-16 NOTE — Telephone Encounter (Signed)
Had questions about Metamucil and other agents to assist with her bowels.  Questions answered.

## 2020-05-16 NOTE — Telephone Encounter (Signed)
I returned patient's call and she stated she specifically wanted to speak to Dr. Henrene Pastor with her questions.  I offered to help her but she was adamant that she speak to Dr. Henrene Pastor.  I asked her to just give me the name of the medications in question but she stated she only wanted to speak with Dr. Henrene Pastor.

## 2020-05-17 DIAGNOSIS — M25561 Pain in right knee: Secondary | ICD-10-CM | POA: Diagnosis not present

## 2020-05-17 DIAGNOSIS — Z872 Personal history of diseases of the skin and subcutaneous tissue: Secondary | ICD-10-CM | POA: Diagnosis not present

## 2020-05-17 DIAGNOSIS — I788 Other diseases of capillaries: Secondary | ICD-10-CM | POA: Diagnosis not present

## 2020-05-17 DIAGNOSIS — L298 Other pruritus: Secondary | ICD-10-CM | POA: Diagnosis not present

## 2020-05-17 DIAGNOSIS — Z09 Encounter for follow-up examination after completed treatment for conditions other than malignant neoplasm: Secondary | ICD-10-CM | POA: Diagnosis not present

## 2020-05-21 ENCOUNTER — Other Ambulatory Visit: Payer: Self-pay | Admitting: Family Medicine

## 2020-05-22 ENCOUNTER — Other Ambulatory Visit: Payer: Self-pay | Admitting: Family Medicine

## 2020-05-22 ENCOUNTER — Encounter: Payer: Self-pay | Admitting: Family Medicine

## 2020-05-22 NOTE — Telephone Encounter (Signed)
Pt requesting Diazepam 10 mg tab LOV: 03/21/2020 Future Visits: 05/25/2020 Last refill: 04/19/2020  Approve?

## 2020-05-23 ENCOUNTER — Other Ambulatory Visit: Payer: Self-pay | Admitting: Family Medicine

## 2020-05-23 DIAGNOSIS — M1711 Unilateral primary osteoarthritis, right knee: Secondary | ICD-10-CM | POA: Diagnosis not present

## 2020-05-23 DIAGNOSIS — M25561 Pain in right knee: Secondary | ICD-10-CM | POA: Diagnosis not present

## 2020-05-23 MED ORDER — BUPROPION HCL ER (XL) 150 MG PO TB24: 150.0000 mg | ORAL_TABLET | Freq: Every day | ORAL | 1 refills | Status: DC

## 2020-05-23 MED ORDER — DIPHENOXYLATE-ATROPINE 2.5-0.025 MG PO TABS: 1.0000 | ORAL_TABLET | Freq: Four times a day (QID) | ORAL | 1 refills | Status: DC | PRN

## 2020-05-25 ENCOUNTER — Ambulatory Visit: Payer: Medicare Other | Admitting: Family Medicine

## 2020-06-21 DIAGNOSIS — Z23 Encounter for immunization: Secondary | ICD-10-CM | POA: Diagnosis not present

## 2020-06-22 ENCOUNTER — Other Ambulatory Visit: Payer: Self-pay | Admitting: Family Medicine

## 2020-06-25 ENCOUNTER — Ambulatory Visit: Payer: Medicare Other | Admitting: Family Medicine

## 2020-06-25 ENCOUNTER — Other Ambulatory Visit: Payer: Self-pay

## 2020-06-25 MED ORDER — DIAZEPAM 10 MG PO TABS
ORAL_TABLET | ORAL | 0 refills | Status: DC
Start: 2020-06-25 — End: 2020-07-27

## 2020-06-25 NOTE — Telephone Encounter (Signed)
MEDICATION: diazepam (valum) 10 MG  PHARMACY: [facility] [address]  Comments:   **Let patient know to contact pharmacy at the end of the day to make sure medication is ready. **  ** Please notify patient to allow 48-72 hours to process**  **Encourage patient to contact the pharmacy for refills or they can request refills through [facility]**

## 2020-06-25 NOTE — Telephone Encounter (Signed)
Last refill: 05/23/20 #60, 0 Last OV: 03/09/20 dx. SIADH

## 2020-06-26 NOTE — Telephone Encounter (Signed)
Medication sent to pharmacy  

## 2020-07-10 ENCOUNTER — Other Ambulatory Visit (INDEPENDENT_AMBULATORY_CARE_PROVIDER_SITE_OTHER): Payer: Medicare Other

## 2020-07-10 DIAGNOSIS — E222 Syndrome of inappropriate secretion of antidiuretic hormone: Secondary | ICD-10-CM | POA: Diagnosis not present

## 2020-07-10 LAB — BASIC METABOLIC PANEL
BUN: 18 mg/dL (ref 6–23)
CO2: 27 mEq/L (ref 19–32)
Calcium: 9 mg/dL (ref 8.4–10.5)
Chloride: 98 mEq/L (ref 96–112)
Creatinine, Ser: 0.88 mg/dL (ref 0.40–1.20)
GFR: 64.63 mL/min (ref >60.00)
Glucose, Bld: 102 mg/dL — ABNORMAL HIGH (ref 70–99)
Potassium: 4.3 mEq/L (ref 3.5–5.1)
Sodium: 133 mEq/L — ABNORMAL LOW (ref 135–145)

## 2020-07-12 ENCOUNTER — Other Ambulatory Visit: Payer: Self-pay | Admitting: Family Medicine

## 2020-07-13 ENCOUNTER — Encounter: Payer: Self-pay | Admitting: Family Medicine

## 2020-07-13 ENCOUNTER — Other Ambulatory Visit: Payer: Self-pay

## 2020-07-13 ENCOUNTER — Ambulatory Visit (INDEPENDENT_AMBULATORY_CARE_PROVIDER_SITE_OTHER): Payer: Medicare Other | Admitting: Family Medicine

## 2020-07-13 VITALS — BP 132/82 | HR 88

## 2020-07-13 DIAGNOSIS — E222 Syndrome of inappropriate secretion of antidiuretic hormone: Secondary | ICD-10-CM

## 2020-07-13 DIAGNOSIS — I10 Essential (primary) hypertension: Secondary | ICD-10-CM | POA: Diagnosis not present

## 2020-07-13 DIAGNOSIS — E782 Mixed hyperlipidemia: Secondary | ICD-10-CM | POA: Diagnosis not present

## 2020-07-13 DIAGNOSIS — Z23 Encounter for immunization: Secondary | ICD-10-CM | POA: Diagnosis not present

## 2020-07-13 DIAGNOSIS — F339 Major depressive disorder, recurrent, unspecified: Secondary | ICD-10-CM | POA: Diagnosis not present

## 2020-07-13 NOTE — Patient Instructions (Addendum)
-  continue wellbutrin daily -other labs today -f/u in 6 months  Hang in there. Try to get involved in something you really love!  Dr. Rogers Blocker

## 2020-07-13 NOTE — Progress Notes (Signed)
Patient: Katrina Benitez MRN: [id number] DOB: 12-26-1945 PCP: Orma Flaming, MD     Subjective:  Chief Complaint  Patient presents with  . Follow-up  . Depression  . siadh  . Hypertension    HPI: The patient is a 74 y.o. female who presents today for depression follow up. WE started her on wellbutrin 150mg . We tried to increase her to 300mg , but she did not tolerate it. She is currently on 150mg  XR daily and tolerating well. She thinks it may be helping her. Not crying as much. She still has a hard time with aging and feels like there is no future ahead of her. She isn't really involved in much here.   SIADH Just had her sodium done and at baseline around 133. Asymptomatic.   HTN -on no medication. Has been well controlled.   hyperlipidemia -On no statin per cardiology.   High dose flu shot  Review of Systems  Constitutional: Negative for chills, fatigue and fever.  Respiratory: Negative for cough and shortness of breath.   Cardiovascular: Negative for chest pain, palpitations and leg swelling.  Gastrointestinal: Negative for abdominal pain.  Psychiatric/Behavioral: Positive for dysphoric mood and sleep disturbance. The patient is not nervous/anxious.     Allergies Patient is allergic to metronidazole, fentanyl, ciprofloxacin, dilaudid [hydromorphone hcl], and visipaque [iodixanol].  Past Medical History Patient  has a past medical history of Allergy, Anemia, Anxiety, Arthritis, Cardiac arrhythmia, Depression, Diverticulitis, Diverticulosis, GERD (gastroesophageal reflux disease), Heart murmur, HTN (hypertension), Infertility, female, Neuromuscular disorder ([place]), Peripheral neuropathy, Positive PPD, and SIADH (syndrome of inappropriate ADH production) ([place]).  Surgical History Patient  has a past surgical history that includes Lumbar disc surgery; Lumbar laminectomy; Tonsillectomy; Colonoscopy; Upper gastrointestinal endoscopy; Cervical polypectomy (02/2016); Breast biopsy  (Left); Breast excisional biopsy (Left); Colposcopy; Dilation and curettage of uterus; Hysteroscopy; and Cervical biopsy w/ loop electrode excision.  Family History Pateint's family history includes Breast cancer in her mother; Depression in her brother and father; Early death in her father; Glaucoma in her mother; Heart attack in her father; Heart disease in her father; Hypertension in her brother; Miscarriages / [place] in her mother; Prostate cancer in her paternal grandfather; Tuberculosis in her paternal grandmother.  Social History Patient  reports that she has never smoked. She has never used smokeless tobacco. She reports current alcohol use of about 14.0 standard drinks of alcohol per week. She reports that she does not use drugs.    Objective: Vitals:   07/13/20 1409  BP: 132/82  Pulse: 88  SpO2: 97%    There is no height or weight on file to calculate BMI.  Physical Exam Vitals reviewed.  Constitutional:      Appearance: Normal appearance. She is obese.  HENT:     Head: Normocephalic and atraumatic.  Eyes:     Conjunctiva/sclera: Conjunctivae normal.     Pupils: Pupils are equal, round, and reactive to light.  Neck:     Vascular: No carotid bruit.  Cardiovascular:     Rate and Rhythm: Normal rate and regular rhythm.     Heart sounds: Normal heart sounds.  Pulmonary:     Effort: Pulmonary effort is normal.     Breath sounds: Normal breath sounds.  Abdominal:     General: Bowel sounds are normal.     Palpations: Abdomen is soft.  Musculoskeletal:     Cervical back: Normal range of motion and neck supple.  Skin:    Capillary Refill: Capillary refill takes less than 2 seconds.  Neurological:     General: No focal deficit present.     Mental Status: She is alert and oriented to person, place, and time.  Psychiatric:        Mood and Affect: Mood normal.        Behavior: Behavior normal.     Comments: Tearful at times  No si/hi      Office Visit from  07/13/2020 in [place]  PHQ-9 Total Score 5         Assessment/plan: 1. Primary hypertension Routine labs today. Well controlled off medication. Continue q 6 months follow up.  - CBC with Differential/Platelet; Future - Microalbumin / creatinine urine ratio; Future  2. SIADH (syndrome of inappropriate ADH production) ([place]) Just checked and at baseline at 133. Continue q monthly checks. Standing order in place. Continue daily sodium.   3. Depression, recurrent ([place])  phq9 with improvement since starting wellbutrin. She does seem a little better, but still down. I do think involvement in things would help. Encouraged her to seek this out. F/u in 6 months.   4. Mixed hyperlipidemia  - Lipid panel; Future    This visit occurred during the SARS-CoV-2 public health emergency.  Safety protocols were in place, including screening questions prior to the visit, additional usage of staff PPE, and extensive cleaning of exam room while observing appropriate contact time as indicated for disinfecting solutions.    Return in about 6 months (around 01/11/2021) for labs/depression .     Orma Flaming, MD [place]  07/13/2020

## 2020-07-14 LAB — CBC WITH DIFFERENTIAL/PLATELET
Absolute Monocytes: 616 cells/uL (ref 200–950)
Basophils Absolute: 39 cells/uL (ref 0–200)
Basophils Relative: 0.7 %
Eosinophils Absolute: 72 cells/uL (ref 15–500)
Eosinophils Relative: 1.3 %
HCT: 34.1 % — ABNORMAL LOW (ref 35.0–45.0)
Hemoglobin: 11.6 g/dL — ABNORMAL LOW (ref 11.7–15.5)
Lymphs Abs: 1606 cells/uL (ref 850–3900)
MCH: 30 pg (ref 27.0–33.0)
MCHC: 34 g/dL (ref 32.0–36.0)
MCV: 88.1 fL (ref 80.0–100.0)
MPV: 8.8 fL (ref 7.5–12.5)
Monocytes Relative: 11.2 %
Neutro Abs: 3168 cells/uL (ref 1500–7800)
Neutrophils Relative %: 57.6 %
Platelets: 309 10*3/uL (ref 140–400)
RBC: 3.87 10*6/uL (ref 3.80–5.10)
RDW: 13.9 % (ref 11.0–15.0)
Total Lymphocyte: 29.2 %
WBC: 5.5 10*3/uL (ref 3.8–10.8)

## 2020-07-14 LAB — LIPID PANEL
Cholesterol: 213 mg/dL — ABNORMAL HIGH (ref <200)
HDL: 95 mg/dL (ref >=50)
LDL Cholesterol (Calc): 99 mg/dL (calc)
Non-HDL Cholesterol (Calc): 118 mg/dL (calc) (ref <130)
Total CHOL/HDL Ratio: 2.2 (calc) (ref <5.0)
Triglycerides: 91 mg/dL (ref <150)

## 2020-07-14 LAB — HEPATIC FUNCTION PANEL
AG Ratio: 1.8 (calc) (ref 1.0–2.5)
ALT: 18 U/L (ref 6–29)
AST: 19 U/L (ref 10–35)
Albumin: 4.3 g/dL (ref 3.6–5.1)
Alkaline phosphatase (APISO): 114 U/L (ref 37–153)
Bilirubin, Direct: 0.1 mg/dL (ref 0.0–0.2)
Globulin: 2.4 g/dL (calc) (ref 1.9–3.7)
Indirect Bilirubin: 0.3 mg/dL (calc) (ref 0.2–1.2)
Total Bilirubin: 0.4 mg/dL (ref 0.2–1.2)
Total Protein: 6.7 g/dL (ref 6.1–8.1)

## 2020-07-16 ENCOUNTER — Encounter: Payer: Self-pay | Admitting: Family Medicine

## 2020-07-27 ENCOUNTER — Other Ambulatory Visit: Payer: Self-pay | Admitting: Family Medicine

## 2020-07-27 DIAGNOSIS — L309 Dermatitis, unspecified: Secondary | ICD-10-CM | POA: Diagnosis not present

## 2020-08-09 ENCOUNTER — Other Ambulatory Visit: Payer: Self-pay | Admitting: Family Medicine

## 2020-08-13 DIAGNOSIS — L298 Other pruritus: Secondary | ICD-10-CM | POA: Diagnosis not present

## 2020-08-13 DIAGNOSIS — D692 Other nonthrombocytopenic purpura: Secondary | ICD-10-CM | POA: Diagnosis not present

## 2020-08-13 DIAGNOSIS — L570 Actinic keratosis: Secondary | ICD-10-CM | POA: Diagnosis not present

## 2020-08-20 ENCOUNTER — Telehealth: Payer: Self-pay | Admitting: Internal Medicine

## 2020-08-20 ENCOUNTER — Other Ambulatory Visit: Payer: Self-pay | Admitting: Family Medicine

## 2020-08-20 NOTE — Telephone Encounter (Signed)
Pt is requesting a call back from a nurse to possibly refill her medication ondansetron ODT, pt knows Dr Henrene Pastor didn't prescribe these medications but she wants to know if he could refill them for her.

## 2020-08-20 NOTE — Telephone Encounter (Signed)
Please advise Dr. Henrene Pastor if you will refill patient's ondansetron.

## 2020-08-20 NOTE — Telephone Encounter (Signed)
Ok to refill Zofran. Thanks

## 2020-08-21 MED ORDER — ONDANSETRON 8 MG PO TBDP
ORAL_TABLET | ORAL | 1 refills | Status: DC
Start: 2020-08-21 — End: 2021-08-06

## 2020-08-21 NOTE — Telephone Encounter (Signed)
Prescription for zofran sent to patient's pharmacy. Patient notified and verbalized understanding. Patient states she will also want her Lomotil switched to Dr. Henrene Pastor as the prescribing doctor but does not need it refilled now. Informed patient to contact our office when it needs to be refilled.

## 2020-08-23 ENCOUNTER — Other Ambulatory Visit: Payer: Self-pay | Admitting: Family Medicine

## 2020-08-24 ENCOUNTER — Telehealth: Payer: Self-pay

## 2020-08-24 ENCOUNTER — Other Ambulatory Visit: Payer: Self-pay | Admitting: Family Medicine

## 2020-08-24 DIAGNOSIS — E222 Syndrome of inappropriate secretion of antidiuretic hormone: Secondary | ICD-10-CM

## 2020-08-24 DIAGNOSIS — L309 Dermatitis, unspecified: Secondary | ICD-10-CM

## 2020-08-24 NOTE — Telephone Encounter (Signed)
I spoke with the pt to confirm that immunizations are up to date. Pt says that this is a request from her Dermatologist. She is requesting BMP be ordered as well as ANA. Pt had no further questions or concerns.

## 2020-08-24 NOTE — Telephone Encounter (Signed)
Pt is requesting Valium 10 mg tab LOV: 07/13/2020 Next Visit: 01/16/2021 Last refill: 07/27/2020  Approve?

## 2020-08-24 NOTE — Telephone Encounter (Signed)
Patient called in and stated she wants to know if she up to date on her vaccines. Patient would like a call back to discuss.

## 2020-08-24 NOTE — Progress Notes (Signed)
[facility] entered. Has standing bmp order as well.  Orma Flaming, MD [place]

## 2020-09-12 ENCOUNTER — Other Ambulatory Visit: Payer: Self-pay | Admitting: Dermatology

## 2020-09-12 ENCOUNTER — Other Ambulatory Visit: Payer: Self-pay

## 2020-09-12 ENCOUNTER — Ambulatory Visit
Admission: RE | Admit: 2020-09-12 | Discharge: 2020-09-12 | Disposition: A | Discharge status: Home / Self Care | Payer: Medicare Other | Source: Ambulatory Visit | Attending: Dermatology | Admitting: Dermatology

## 2020-09-12 ENCOUNTER — Other Ambulatory Visit: Payer: Self-pay | Admitting: Family Medicine

## 2020-09-12 ENCOUNTER — Telehealth: Payer: Self-pay

## 2020-09-12 ENCOUNTER — Other Ambulatory Visit (INDEPENDENT_AMBULATORY_CARE_PROVIDER_SITE_OTHER): Payer: Medicare Other

## 2020-09-12 DIAGNOSIS — L299 Pruritus, unspecified: Secondary | ICD-10-CM

## 2020-09-12 DIAGNOSIS — L309 Dermatitis, unspecified: Secondary | ICD-10-CM

## 2020-09-12 DIAGNOSIS — I10 Essential (primary) hypertension: Secondary | ICD-10-CM | POA: Diagnosis not present

## 2020-09-12 DIAGNOSIS — E222 Syndrome of inappropriate secretion of antidiuretic hormone: Secondary | ICD-10-CM

## 2020-09-12 LAB — CBC WITH DIFFERENTIAL/PLATELET
Basophils Absolute: 0 10*3/uL (ref 0.0–0.1)
Basophils Relative: 0.7 % (ref 0.0–3.0)
Eosinophils Absolute: 0.2 10*3/uL (ref 0.0–0.7)
Eosinophils Relative: 3.1 % (ref 0.0–5.0)
HCT: 34.6 % — ABNORMAL LOW (ref 36.0–46.0)
Hemoglobin: 11.6 g/dL — ABNORMAL LOW (ref 12.0–15.0)
Lymphocytes Relative: 25.8 % (ref 12.0–46.0)
Lymphs Abs: 1.3 10*3/uL (ref 0.7–4.0)
MCHC: 33.6 g/dL (ref 30.0–36.0)
MCV: 88.4 fl (ref 78.0–100.0)
Monocytes Absolute: 0.5 10*3/uL (ref 0.1–1.0)
Monocytes Relative: 10.6 % (ref 3.0–12.0)
Neutro Abs: 3.1 10*3/uL (ref 1.4–7.7)
Neutrophils Relative %: 59.8 % (ref 43.0–77.0)
Platelets: 309 10*3/uL (ref 150.0–400.0)
RBC: 3.91 Mil/uL (ref 3.87–5.11)
RDW: 14.1 % (ref 11.5–15.5)
WBC: 5.2 10*3/uL (ref 4.0–10.5)

## 2020-09-12 LAB — COMPREHENSIVE METABOLIC PANEL
ALT: 17 U/L (ref 0–35)
AST: 17 U/L (ref 0–37)
Albumin: 4.3 g/dL (ref 3.5–5.2)
Alkaline Phosphatase: 114 U/L (ref 39–117)
BUN: 17 mg/dL (ref 6–23)
CO2: 25 mEq/L (ref 19–32)
Calcium: 9.2 mg/dL (ref 8.4–10.5)
Chloride: 99 mEq/L (ref 96–112)
Creatinine, Ser: 0.94 mg/dL (ref 0.40–1.20)
GFR: 59.83 mL/min — ABNORMAL LOW (ref >60.00)
Glucose, Bld: 90 mg/dL (ref 70–99)
Potassium: 4.1 mEq/L (ref 3.5–5.1)
Sodium: 132 mEq/L — ABNORMAL LOW (ref 135–145)
Total Bilirubin: 0.3 mg/dL (ref 0.2–1.2)
Total Protein: 6.9 g/dL (ref 6.0–8.3)

## 2020-09-12 NOTE — Telephone Encounter (Signed)
Patient is requesting once labs drawn DOS 12/22 result to be faxed to Dr. Rolm Bookbinder with [facility]  FX# [phone].

## 2020-09-13 LAB — TSH: TSH: 2.27 u[IU]/mL (ref 0.35–4.50)

## 2020-09-14 LAB — ANA, IFA COMPREHENSIVE PANEL
Anti Nuclear Antibody (ANA): NEGATIVE
ENA SM Ab Ser-aCnc: <1 AI
SM/RNP: <1 AI
SSA (Ro) (ENA) Antibody, IgG: <1 AI
SSB (La) (ENA) Antibody, IgG: <1 AI
Scleroderma (Scl-70) (ENA) Antibody, IgG: <1 AI
ds DNA Ab: <1 IU/mL

## 2020-09-17 NOTE — Telephone Encounter (Signed)
Labs faxed today Orland Mustard, MD [facility]

## 2020-09-28 ENCOUNTER — Other Ambulatory Visit: Payer: Self-pay | Admitting: Family Medicine

## 2020-10-04 DIAGNOSIS — D0472 Carcinoma in situ of skin of left lower limb, including hip: Secondary | ICD-10-CM | POA: Diagnosis not present

## 2020-10-04 DIAGNOSIS — L308 Other specified dermatitis: Secondary | ICD-10-CM | POA: Diagnosis not present

## 2020-10-04 DIAGNOSIS — D485 Neoplasm of uncertain behavior of skin: Secondary | ICD-10-CM | POA: Diagnosis not present

## 2020-10-11 DIAGNOSIS — L400 Psoriasis vulgaris: Secondary | ICD-10-CM | POA: Diagnosis not present

## 2020-10-11 DIAGNOSIS — D0472 Carcinoma in situ of skin of left lower limb, including hip: Secondary | ICD-10-CM | POA: Diagnosis not present

## 2020-10-13 ENCOUNTER — Other Ambulatory Visit: Payer: Self-pay | Admitting: Family Medicine

## 2020-10-25 ENCOUNTER — Other Ambulatory Visit: Payer: Self-pay | Admitting: Family Medicine

## 2020-11-09 ENCOUNTER — Other Ambulatory Visit: Payer: Self-pay | Admitting: Family Medicine

## 2020-11-09 ENCOUNTER — Telehealth: Payer: Self-pay

## 2020-11-09 NOTE — Telephone Encounter (Signed)
  LAST APPOINTMENT DATE: 07/13/20   NEXT APPOINTMENT DATE:@2 /18/2022  MEDICATION: gabapentin (NEURONTIN) 600 MG tablet  PHARMACY:[facility] - [place], [place] - [address]  Comments: Patient is almost out and is requesting this be sent in today

## 2020-11-09 NOTE — Telephone Encounter (Signed)
error 

## 2020-11-16 ENCOUNTER — Encounter: Payer: Self-pay | Admitting: Family Medicine

## 2020-11-16 ENCOUNTER — Other Ambulatory Visit: Payer: Self-pay

## 2020-11-16 ENCOUNTER — Telehealth (INDEPENDENT_AMBULATORY_CARE_PROVIDER_SITE_OTHER): Payer: Medicare Other | Admitting: Family Medicine

## 2020-11-16 VITALS — Ht 66.0 in | Wt 190.0 lb

## 2020-11-16 DIAGNOSIS — F5101 Primary insomnia: Secondary | ICD-10-CM

## 2020-11-16 DIAGNOSIS — L299 Pruritus, unspecified: Secondary | ICD-10-CM | POA: Diagnosis not present

## 2020-11-16 MED ORDER — HYDROXYZINE PAMOATE 25 MG PO CAPS: 25.0000 mg | ORAL_CAPSULE | Freq: Four times a day (QID) | ORAL | 0 refills | Status: DC | PRN

## 2020-11-16 MED ORDER — ZOLPIDEM TARTRATE 10 MG PO TABS: 10.0000 mg | ORAL_TABLET | Freq: Every evening | ORAL | 0 refills | Status: DC | PRN

## 2020-11-16 NOTE — Progress Notes (Signed)
Patient: Katrina Benitez MRN: [id number] DOB: 1946-04-01 PCP: Orma Flaming, MD     I connected with [name] on 11/16/20 at 9:43am by a video enabled telemedicine application and verified that I am speaking with the correct person using two identifiers.  Location patient: Home Location provider: [facility], Office Persons participating in this virtual visit: [name] and Dr. Rogers Blocker   I discussed the limitations of evaluation and management by telemedicine and the availability of in person appointments. The patient expressed understanding and agreed to proceed.   Interactive audio and video telecommunications were attempted between this provider and patient, however failed, due to patient having technical difficulties OR patient did not have access to video capability.  We continued and completed visit with audio only.    Subjective:  Chief Complaint  Patient presents with  . Pruritis    Pt requests an increase on Hydroxyzine.    HPI: The patient is a 75 y.o. female who presents today to discuss Hydroxyzine. She says that it is saving her life,  she is requesting an increase. She follows with dermatology, but I have been refilling this for her. She is wanting to know if we can increase the hydroxyzine to 4 pills/day as this is the only thing that helps her and helps her cope through the day. She did just get an injection at the dermatologist for atopic dermatitis. She doesn't feel like this is working at all. (dupixent).   Also needing refill of her ambien. She gets 90 day supply and last filled 07/12/20. Takes as needed. pmp website reviewed.    Review of Systems  Constitutional: Negative for chills, fatigue and fever.  Respiratory: Negative for cough and shortness of breath.   Cardiovascular: Negative for chest pain, palpitations and leg swelling.  Gastrointestinal: Negative for abdominal pain.  Skin: Negative for rash and wound.       Pruritis    Psychiatric/Behavioral: Positive for sleep disturbance.    Allergies Patient is allergic to metronidazole, fentanyl, ciprofloxacin, dilaudid [hydromorphone hcl], and visipaque [iodixanol].  Past Medical History Patient  has a past medical history of Allergy, Anemia, Anxiety, Arthritis, Cardiac arrhythmia, Depression, Diverticulitis, Diverticulosis, GERD (gastroesophageal reflux disease), Heart murmur, HTN (hypertension), Infertility, female, Neuromuscular disorder ([place]), Peripheral neuropathy, Positive PPD, and SIADH (syndrome of inappropriate ADH production) ([place]).  Surgical History Patient  has a past surgical history that includes Lumbar disc surgery; Lumbar laminectomy; Tonsillectomy; Colonoscopy; Upper gastrointestinal endoscopy; Cervical polypectomy (02/2016); Breast biopsy (Left); Breast excisional biopsy (Left); Colposcopy; Dilation and curettage of uterus; Hysteroscopy; and Cervical biopsy w/ loop electrode excision.  Family History Pateint's family history includes Breast cancer in her mother; Depression in her brother and father; Early death in her father; Glaucoma in her mother; Heart attack in her father; Heart disease in her father; Hypertension in her brother; Miscarriages / [place] in her mother; Prostate cancer in her paternal grandfather; Tuberculosis in her paternal grandmother.  Social History Patient  reports that she has never smoked. She has never used smokeless tobacco. She reports current alcohol use of about 14.0 standard drinks of alcohol per week. She reports that she does not use drugs.    Objective: Vitals:   11/16/20 0930  Weight: 190 lb 0.6 oz (86.2 kg)  Height: 5\' 6"  (1.676 m)    Body mass index is 30.67 kg/m.  Physical Exam Vitals reviewed.  Psychiatric:        Mood and Affect: Mood normal.  Behavior: Behavior normal.        Assessment/plan: 1. Pruritus Followed by dermatology. dupixent started. Hydroxyzine beneficial, will increase  to four times a day. Tolerates well, but drowsy precautions given. New px given.   2. Primary insomnia pmp website reviewed. ambien refilled. Continue to practice sleep hygiene.   Spent 23 minutes on phone discussing above problems/treatment and answered all of her questions.   Return in about 2 months (around 01/14/2021). she has appointment scheduled on 4/27.     Orma Flaming, MD [place]  11/16/2020

## 2020-11-21 ENCOUNTER — Other Ambulatory Visit: Payer: Self-pay | Admitting: Family Medicine

## 2020-11-21 NOTE — Telephone Encounter (Signed)
Pt is requesting Diazepam 10 mg tab LOV: 11/16/2020 Next Visit: 01/16/2021 Last refill: 10/26/2020  Approve?

## 2020-12-20 ENCOUNTER — Other Ambulatory Visit: Payer: Self-pay | Admitting: Family Medicine

## 2020-12-20 ENCOUNTER — Telehealth: Payer: Self-pay

## 2020-12-20 NOTE — Telephone Encounter (Signed)
Pt requesting Diazepam 10 mg tab LOV: 11/16/2020 Next Visit: 01/16/2021 Last refill: 11/21/2020  Approve?

## 2020-12-20 NOTE — Telephone Encounter (Signed)
..   LAST APPOINTMENT DATE: 11/16/2020   NEXT APPOINTMENT DATE:@4 /27/2022  MEDICATION:diazepam (VALIUM) 10 MG tablet

## 2020-12-27 ENCOUNTER — Other Ambulatory Visit (INDEPENDENT_AMBULATORY_CARE_PROVIDER_SITE_OTHER): Payer: Medicare Other

## 2020-12-27 DIAGNOSIS — I10 Essential (primary) hypertension: Secondary | ICD-10-CM | POA: Diagnosis not present

## 2020-12-27 DIAGNOSIS — E782 Mixed hyperlipidemia: Secondary | ICD-10-CM | POA: Diagnosis not present

## 2020-12-27 DIAGNOSIS — E875 Hyperkalemia: Secondary | ICD-10-CM

## 2020-12-27 DIAGNOSIS — E559 Vitamin D deficiency, unspecified: Secondary | ICD-10-CM | POA: Diagnosis not present

## 2020-12-27 DIAGNOSIS — E222 Syndrome of inappropriate secretion of antidiuretic hormone: Secondary | ICD-10-CM

## 2020-12-27 NOTE — Addendum Note (Signed)
Addended by: Doran Clay A on: 12/27/2020 02:26 PM   Modules accepted: Orders

## 2020-12-27 NOTE — Addendum Note (Signed)
Addended by: Doran Clay A on: 12/27/2020 02:25 PM   Modules accepted: Orders

## 2020-12-28 LAB — CBC WITH DIFFERENTIAL/PLATELET
Basophils Absolute: 0.1 10*3/uL (ref 0.0–0.1)
Basophils Relative: 1.6 % (ref 0.0–3.0)
Eosinophils Absolute: 0.3 10*3/uL (ref 0.0–0.7)
Eosinophils Relative: 5.5 % — ABNORMAL HIGH (ref 0.0–5.0)
HCT: 33.2 % — ABNORMAL LOW (ref 36.0–46.0)
Hemoglobin: 11.2 g/dL — ABNORMAL LOW (ref 12.0–15.0)
Lymphocytes Relative: 23.3 % (ref 12.0–46.0)
Lymphs Abs: 1.3 10*3/uL (ref 0.7–4.0)
MCHC: 33.7 g/dL (ref 30.0–36.0)
MCV: 87.5 fl (ref 78.0–100.0)
Monocytes Absolute: 0.7 10*3/uL (ref 0.1–1.0)
Monocytes Relative: 12.1 % — ABNORMAL HIGH (ref 3.0–12.0)
Neutro Abs: 3.2 10*3/uL (ref 1.4–7.7)
Neutrophils Relative %: 57.5 % (ref 43.0–77.0)
Platelets: 315 10*3/uL (ref 150.0–400.0)
RBC: 3.79 Mil/uL — ABNORMAL LOW (ref 3.87–5.11)
RDW: 14.1 % (ref 11.5–15.5)
WBC: 5.6 10*3/uL (ref 4.0–10.5)

## 2020-12-28 LAB — LIPID PANEL
Cholesterol: 231 mg/dL — ABNORMAL HIGH (ref 0–200)
HDL: 111.9 mg/dL (ref >39.00)
LDL Cholesterol: 101 mg/dL — ABNORMAL HIGH (ref 0–99)
NonHDL: 119.48
Total CHOL/HDL Ratio: 2
Triglycerides: 90 mg/dL (ref 0.0–149.0)
VLDL: 18 mg/dL (ref 0.0–40.0)

## 2020-12-28 LAB — BASIC METABOLIC PANEL
BUN: 16 mg/dL (ref 6–23)
CO2: 28 mEq/L (ref 19–32)
Calcium: 8.7 mg/dL (ref 8.4–10.5)
Chloride: 97 mEq/L (ref 96–112)
Creatinine, Ser: 1.01 mg/dL (ref 0.40–1.20)
GFR: 54.77 mL/min — ABNORMAL LOW (ref >60.00)
Glucose, Bld: 84 mg/dL (ref 70–99)
Potassium: 4.7 mEq/L (ref 3.5–5.1)
Sodium: 132 mEq/L — ABNORMAL LOW (ref 135–145)

## 2020-12-28 LAB — VITAMIN D 25 HYDROXY (VIT D DEFICIENCY, FRACTURES): VITD: 48.72 ng/mL (ref 30.00–100.00)

## 2020-12-28 LAB — TSH: TSH: 1.55 u[IU]/mL (ref 0.35–4.50)

## 2021-01-08 DIAGNOSIS — Z85828 Personal history of other malignant neoplasm of skin: Secondary | ICD-10-CM | POA: Diagnosis not present

## 2021-01-08 DIAGNOSIS — L298 Other pruritus: Secondary | ICD-10-CM | POA: Diagnosis not present

## 2021-01-09 ENCOUNTER — Encounter: Payer: Self-pay | Admitting: Family Medicine

## 2021-01-09 ENCOUNTER — Telehealth (INDEPENDENT_AMBULATORY_CARE_PROVIDER_SITE_OTHER): Payer: Medicare Other | Admitting: Family Medicine

## 2021-01-09 ENCOUNTER — Other Ambulatory Visit: Payer: Self-pay

## 2021-01-09 VITALS — Ht 66.0 in | Wt 190.0 lb

## 2021-01-09 DIAGNOSIS — E782 Mixed hyperlipidemia: Secondary | ICD-10-CM

## 2021-01-09 DIAGNOSIS — F339 Major depressive disorder, recurrent, unspecified: Secondary | ICD-10-CM

## 2021-01-09 DIAGNOSIS — E559 Vitamin D deficiency, unspecified: Secondary | ICD-10-CM

## 2021-01-09 DIAGNOSIS — H25019 Cortical age-related cataract, unspecified eye: Secondary | ICD-10-CM

## 2021-01-09 DIAGNOSIS — E222 Syndrome of inappropriate secretion of antidiuretic hormone: Secondary | ICD-10-CM

## 2021-01-09 DIAGNOSIS — D649 Anemia, unspecified: Secondary | ICD-10-CM | POA: Insufficient documentation

## 2021-01-09 DIAGNOSIS — I10 Essential (primary) hypertension: Secondary | ICD-10-CM | POA: Diagnosis not present

## 2021-01-09 NOTE — Progress Notes (Signed)
Patient: Katrina Benitez MRN: [id number] DOB: Dec 03, 1945 PCP: Orma Flaming, MD     I connected with [name] on 01/09/21 at 2:55pm by a video enabled telemedicine application and verified that I am speaking with the correct person using two identifiers.  Location patient: Home Location provider: [facility], Office Persons participating in this virtual visit: Joi Leyva and Dr Rogers Blocker   I discussed the limitations of evaluation and management by telemedicine and the availability of in person appointments. The patient expressed understanding and agreed to proceed.   Subjective:  Chief Complaint  Patient presents with  . Discuss Labs  . SIADH  . vitamin D deficiency  . Hypertension  . Hyperlipidemia    HPI: The patient is a 75 y.o. female who presents today to discuss labs. Hx of SIADH, vitamin D deficiency, HTN and hyperlipidemia.   SIADH On q monthly standing bmp orders. Doesn't always come in. Recent sodium stable at 132. Takes 1.5g sodium every other day.   Hypertension: Here for follow up of hypertension.  Currently on no medication. Exercise includes . Weight has been stable. Denies any chest pain, headaches, shortness of breath, vision changes, swelling in lower extremities.   Vitamin D deficiency Has been stable for over a year. Continues with daily supplements of 4000IU/day.   HLD Sees cardiology. Does not think statin needed. Didn't need lipid panel done, but was ordered. LDL is 101 and HDL is 111.   Mild anemia Has seen hematology in the past and work up was negative. She states she runs around this and will continue to monitor this.   Depression -states she feels like she needs more medication. She can not tolerate 300mg  of her wellbutrin, but wondering about doing 1.5 pills. She states it is a lot of family stuff that is the drive behind this and not being happy here in [place].   Review of Systems  Constitutional: Negative for chills, fatigue and  fever.  HENT: Negative for dental problem, ear pain, hearing loss and trouble swallowing.   Eyes: Negative for visual disturbance.  Respiratory: Negative for cough, chest tightness and shortness of breath.   Cardiovascular: Negative for chest pain, palpitations and leg swelling.  Gastrointestinal: Negative for abdominal pain, blood in stool, diarrhea and nausea.  Endocrine: Negative for cold intolerance, polydipsia, polyphagia and polyuria.  Genitourinary: Negative for dysuria and hematuria.  Musculoskeletal: Negative for arthralgias.  Skin: Negative for rash.       Pruritis   Neurological: Negative for dizziness and headaches.  Psychiatric/Behavioral: Negative for dysphoric mood and sleep disturbance. The patient is not nervous/anxious.     Allergies Patient is allergic to metronidazole, fentanyl, ciprofloxacin, dilaudid [hydromorphone hcl], and visipaque [iodixanol].  Past Medical History Patient  has a past medical history of Allergy, Anemia, Anxiety, Arthritis, Cardiac arrhythmia, Depression, Diverticulitis, Diverticulosis, GERD (gastroesophageal reflux disease), Heart murmur, HTN (hypertension), Infertility, female, Neuromuscular disorder ([place]), Peripheral neuropathy, Positive PPD, and SIADH (syndrome of inappropriate ADH production) ([place]).  Surgical History Patient  has a past surgical history that includes Lumbar disc surgery; Lumbar laminectomy; Tonsillectomy; Colonoscopy; Upper gastrointestinal endoscopy; Cervical polypectomy (02/2016); Breast biopsy (Left); Breast excisional biopsy (Left); Colposcopy; Dilation and curettage of uterus; Hysteroscopy; and Cervical biopsy w/ loop electrode excision.  Family History Pateint's family history includes Breast cancer in her mother; Depression in her brother and father; Early death in her father; Glaucoma in her mother; Heart attack in her father; Heart disease in her father; Hypertension in her brother; Miscarriages /  Stillbirths in her  mother; Prostate cancer in her paternal grandfather; Tuberculosis in her paternal grandmother.  Social History Patient  reports that she has never smoked. She has never used smokeless tobacco. She reports current alcohol use of about 14.0 standard drinks of alcohol per week. She reports that she does not use drugs.    Objective: Vitals:   01/09/21 1428  Weight: 190 lb 0.6 oz (86.2 kg)  Height: 5\' 6"  (1.676 m)    Body mass index is 30.67 kg/m.  Physical Exam Vitals reviewed.  Constitutional:      Appearance: Normal appearance. She is obese.  HENT:     Head: Normocephalic and atraumatic.  Pulmonary:     Effort: Pulmonary effort is normal.  Neurological:     General: No focal deficit present.     Mental Status: She is alert and oriented to person, place, and time.  Psychiatric:        Mood and Affect: Mood normal.        Behavior: Behavior normal.        Assessment/plan: 1. Primary hypertension To goal off medication. Has been well controlled off medication since I have known her. Will continue to monitor.   2. SIADH (syndrome of inappropriate ADH production) ([place]) Around her baseline of 132-133. Continue 1.5g sodium daily and monthly bmp checks. Standing order is in place.   3. Vitamin D deficiency To goal. Continue 4000IU/day.    4. Mixed hyperlipidemia -to goal.   5. Depression -could not tolerate 300mg  of her wellbutrin so we are going to do 225mg /day. She will cut her 150mg  in half. Will need to repeat her sodium to watch this closely with increased med dosage.   -referral placed for [facility].   Total time of encounter: 40 minutes total time of encounter, including 40 minutes spent in face-to-face patient care. This time includes coordination of care and counseling regarding labs, change in medication and plan of care. Remainder of non-face-to-face time involved reviewing chart documents/testing relevant to the patient encounter and documentation in the  medical record.   Return in about 1 month (around 02/08/2021) for lab only, bmp .    Orma Flaming, MD [place]  01/09/2021

## 2021-01-14 ENCOUNTER — Telehealth (HOSPITAL_BASED_OUTPATIENT_CLINIC_OR_DEPARTMENT_OTHER): Payer: Self-pay | Admitting: Obstetrics & Gynecology

## 2021-01-14 NOTE — Telephone Encounter (Signed)
Patient called and wanted to know if she needs go back [facility] and have anything else done like her  MRI and mammogram .

## 2021-01-16 ENCOUNTER — Ambulatory Visit: Payer: Medicare Other | Admitting: Family Medicine

## 2021-01-20 ENCOUNTER — Other Ambulatory Visit: Payer: Self-pay

## 2021-01-20 ENCOUNTER — Encounter (HOSPITAL_COMMUNITY): Payer: Self-pay

## 2021-01-20 ENCOUNTER — Ambulatory Visit (HOSPITAL_COMMUNITY)
Admission: EM | Admit: 2021-01-20 | Discharge: 2021-01-20 | Disposition: A | Discharge status: Home / Self Care | Payer: Medicare Other | Attending: Urgent Care | Admitting: Urgent Care

## 2021-01-20 DIAGNOSIS — L03116 Cellulitis of left lower limb: Secondary | ICD-10-CM

## 2021-01-20 DIAGNOSIS — M79672 Pain in left foot: Secondary | ICD-10-CM | POA: Diagnosis not present

## 2021-01-20 MED ORDER — CEPHALEXIN 500 MG PO CAPS: 500.0000 mg | ORAL_CAPSULE | Freq: Three times a day (TID) | ORAL | 0 refills | Status: DC

## 2021-01-20 NOTE — ED Provider Notes (Signed)
[place]   MRN: [id number] DOB: [birth year]  Subjective:   Katrina Benitez is a 75 y.o. female presenting for 1 day history of acute onset left foot pain and swelling. Has noted some improvement today but has concerns about cellulitis as she has had this before and feels very similar. Denies trauma, falls. Has used otc pain medication with minimal relief. No fever, n/v.   No current facility-administered medications for this encounter.  Current Outpatient Medications:  .  betamethasone valerate (VALISONE) 0.1 % cream, Apply topically 2 (two) times daily. (Patient not taking: Reported on 01/09/2021), Disp: 45 g, Rfl: 1 .  buPROPion (WELLBUTRIN XL) 150 MG 24 hr tablet, TAKE ONE TABLET EACH DAY, Disp: 90 tablet, Rfl: 1 .  cholecalciferol (VITAMIN D3) 10 MCG (400 UNIT) TABS tablet, Take 400 Units by mouth., Disp: , Rfl:  .  clobetasol (TEMOVATE) 0.05 % external solution, APPLY TO AFFECTED AREA UPTO TWICE DAILY AS NEEDED (NOT TO FACE, GROIN, OR UNDERARMS), Disp: , Rfl:  .  diazepam (VALIUM) 10 MG tablet, TAKE ONE TABLET TWICE DAILY AS NEEDED FOR ANXIETY, Disp: 60 tablet, Rfl: 0 .  diphenoxylate-atropine (LOMOTIL) 2.5-0.025 MG tablet, Take 1 tablet by mouth 4 (four) times daily as needed for diarrhea or loose stools., Disp: 120 tablet, Rfl: 1 .  gabapentin (NEURONTIN) 600 MG tablet, TAKE ONE TABLET THREE TIMES DAILY, Disp: 270 tablet, Rfl: 1 .  hydrOXYzine (VISTARIL) 25 MG capsule, Take 1 capsule (25 mg total) by mouth every 6 (six) hours as needed., Disp: 360 capsule, Rfl: 0 .  levocetirizine (XYZAL) 5 MG tablet, Take 5 mg by mouth every evening., Disp: , Rfl:  .  Multiple Vitamins-Minerals (VITAMIN D3 COMPLETE PO), Take 4,000 Units by mouth. (Patient not taking: Reported on 01/09/2021), Disp: , Rfl:  .  omeprazole (PRILOSEC) 40 MG capsule, TAKE ONE CAPSULE EACH DAY, Disp: 90 capsule, Rfl: 3 .  ondansetron (ZOFRAN-ODT) 8 MG disintegrating tablet, TAKE ONE TABLET EVERY EIGHT HOURS  AS NEEDED FOR NAUSEA AND VOMITING, Disp: 20 tablet, Rfl: 1 .  Probiotic Product (ALIGN PO), Take by mouth daily., Disp: , Rfl:  .  sodium chloride 1 g tablet, Take 1 g by mouth daily. , Disp: , Rfl:  .  triamcinolone cream (KENALOG) 0.1 %, APPLY TWICE DAILY (Patient not taking: Reported on 01/09/2021), Disp: 80 g, Rfl: 0 .  zolpidem (AMBIEN) 10 MG tablet, Take 1 tablet (10 mg total) by mouth at bedtime as needed., Disp: 90 tablet, Rfl: 0   Allergies  Allergen Reactions  . Metronidazole Hives  . Fentanyl Itching  . Ciprofloxacin Hives  . Dilaudid [Hydromorphone Hcl] Hives  . Visipaque [Iodixanol] Swelling    Throat swelling    Past Medical History:  Diagnosis Date  . Allergy   . Anemia   . Anxiety   . Arthritis    degenerative disc disorder  . Cardiac arrhythmia   . Depression   . Diverticulitis   . Diverticulosis   . GERD (gastroesophageal reflux disease)   . Heart murmur   . HTN (hypertension)   . Infertility, female   . Neuromuscular disorder ([place])    idiopathic peripheral  neuropathy  . Peripheral neuropathy   . Positive PPD   . SIADH (syndrome of inappropriate ADH production) ([place])      Past Surgical History:  Procedure Laterality Date  . BREAST BIOPSY Left   . BREAST EXCISIONAL BIOPSY Left    benign  . CERVICAL BIOPSY  W/ LOOP  ELECTRODE EXCISION    . CERVICAL POLYPECTOMY  02/2016  . COLONOSCOPY    . COLPOSCOPY    . DILATION AND CURETTAGE OF UTERUS    . HYSTEROSCOPY    . LUMBAR DISC SURGERY    . LUMBAR LAMINECTOMY    . TONSILLECTOMY    . UPPER GASTROINTESTINAL ENDOSCOPY      Family History  Problem Relation Age of Onset  . Breast cancer Mother   . Glaucoma Mother   . Miscarriages / [place] Mother   . Heart attack Father   . Depression Father   . Early death Father   . Heart disease Father   . Tuberculosis Paternal Grandmother   . Prostate cancer Paternal Grandfather   . Depression Brother   . Hypertension Brother   . Colon cancer Neg Hx   .  Esophageal cancer Neg Hx   . Rectal cancer Neg Hx   . Stomach cancer Neg Hx     Social History   Tobacco Use  . Smoking status: Never Smoker  . Smokeless tobacco: Never Used  Vaping Use  . Vaping Use: Never used  Substance Use Topics  . Alcohol use: Yes    Alcohol/week: 14.0 standard drinks    Types: 14 Glasses of wine per week  . Drug use: No    ROS   Objective:   Vitals: BP 128/90 (BP Location: Right Arm)   Pulse 79   Temp 98.5 F (36.9 C) (Oral)   Resp 17   LMP  (LMP Unknown)   SpO2 99%   Physical Exam Constitutional:      General: She is not in acute distress.    Appearance: Normal appearance. She is well-developed. She is not ill-appearing, toxic-appearing or diaphoretic.  HENT:     Head: Normocephalic and atraumatic.     Nose: Nose normal.     Mouth/Throat:     Mouth: Mucous membranes are moist.     Pharynx: Oropharynx is clear.  Eyes:     General: No scleral icterus.       Right eye: No discharge.        Left eye: No discharge.     Extraocular Movements: Extraocular movements intact.     Conjunctiva/sclera: Conjunctivae normal.     Pupils: Pupils are equal, round, and reactive to light.  Cardiovascular:     Rate and Rhythm: Normal rate.  Pulmonary:     Effort: Pulmonary effort is normal.  Musculoskeletal:     Left foot: Normal range of motion and normal capillary refill. Swelling and tenderness (over areas outlined) present. No deformity, laceration, bony tenderness or crepitus.       Legs:  Skin:    General: Skin is warm and dry.  Neurological:     General: No focal deficit present.     Mental Status: She is alert and oriented to person, place, and time.     Motor: No weakness.     Coordination: Coordination normal.     Gait: Gait normal.     Deep Tendon Reflexes: Reflexes normal.  Psychiatric:        Mood and Affect: Mood normal.        Behavior: Behavior normal.        Thought Content: Thought content normal.        Judgment: Judgment  normal.      Assessment and Plan :   PDMP not reviewed this encounter.  1. Cellulitis of foot, left   2. Foot pain, left  Will cover for cellulitis with Keflex. Use supportive care otherwise. Counseled patient on potential for adverse effects with medications prescribed/recommended today, ER and return-to-clinic precautions discussed, patient verbalized understanding.    Jaynee Eagles, [place] 01/23/21 [phone]

## 2021-01-20 NOTE — ED Triage Notes (Signed)
Pt presents with left foot swelling and pain since yesterday.

## 2021-01-22 ENCOUNTER — Other Ambulatory Visit: Payer: Self-pay | Admitting: Family Medicine

## 2021-01-23 ENCOUNTER — Encounter (HOSPITAL_COMMUNITY): Payer: Self-pay | Admitting: Urgent Care

## 2021-02-07 ENCOUNTER — Telehealth: Payer: Self-pay | Admitting: Cardiovascular Disease

## 2021-02-07 DIAGNOSIS — R55 Syncope and collapse: Secondary | ICD-10-CM

## 2021-02-07 NOTE — Telephone Encounter (Signed)
New message:    Patient calling stating that she passed out a few days ago and broke a table and patient had to help her. Patient would like to speak with a doctor.

## 2021-02-07 NOTE — Telephone Encounter (Signed)
The patient reports she passed out Monday or Tuesday "out of the blue" when she was walking across the room. She states she is a "faller" but usually she trips and can tell it's coming. This time, she had no warning and then was on the floor with a broken table. She did not injure herself seriously but does have some bruises.  She had no symptoms or warning at all.  She has no VS to report. She has not had any more events. She is asymptomatic. She understands she will be called with Dr. Antionette Char recommendations.

## 2021-02-08 NOTE — Telephone Encounter (Signed)
I would recommend that she come in for an office evaluation with me or an APP.  She should have an echocardiogram and a 2-week ZIO monitor as well.  Need to get more precise details of the event to determine any further testing.  Please notify her of state of [place] driving restrictions after an episode of frank syncope.  Again can discuss this further when she comes in for evaluation.  Thanks

## 2021-02-11 NOTE — Telephone Encounter (Signed)
Scheduled the patient for evaluation with Dr. Burt Knack tomorrow afternoon at 3:20PM. Scheduled echo this Friday.  She understands not to drive at this time and confirms she has not been since her event. She was grateful for call and agrees with plan.

## 2021-02-12 ENCOUNTER — Encounter: Payer: Self-pay | Admitting: Radiology

## 2021-02-12 ENCOUNTER — Other Ambulatory Visit: Payer: Self-pay

## 2021-02-12 ENCOUNTER — Other Ambulatory Visit: Payer: Self-pay | Admitting: Family Medicine

## 2021-02-12 ENCOUNTER — Ambulatory Visit (INDEPENDENT_AMBULATORY_CARE_PROVIDER_SITE_OTHER): Payer: Medicare Other

## 2021-02-12 ENCOUNTER — Encounter: Payer: Self-pay | Admitting: Cardiovascular Disease

## 2021-02-12 ENCOUNTER — Ambulatory Visit (INDEPENDENT_AMBULATORY_CARE_PROVIDER_SITE_OTHER): Payer: Medicare Other | Admitting: Cardiovascular Disease

## 2021-02-12 ENCOUNTER — Other Ambulatory Visit (INDEPENDENT_AMBULATORY_CARE_PROVIDER_SITE_OTHER): Payer: Medicare Other

## 2021-02-12 VITALS — Ht 66.0 in

## 2021-02-12 DIAGNOSIS — I480 Paroxysmal atrial fibrillation: Secondary | ICD-10-CM

## 2021-02-12 DIAGNOSIS — R55 Syncope and collapse: Secondary | ICD-10-CM

## 2021-02-12 DIAGNOSIS — I351 Nonrheumatic aortic (valve) insufficiency: Secondary | ICD-10-CM | POA: Diagnosis not present

## 2021-02-12 DIAGNOSIS — I872 Venous insufficiency (chronic) (peripheral): Secondary | ICD-10-CM

## 2021-02-12 DIAGNOSIS — E222 Syndrome of inappropriate secretion of antidiuretic hormone: Secondary | ICD-10-CM | POA: Diagnosis not present

## 2021-02-12 NOTE — Patient Instructions (Signed)
Medication Instructions:  Your provider recommends that you continue on your current medications as directed. Please refer to the Current Medication list given to you today.   *If you need a refill on your cardiac medications before your next appointment, please call your pharmacy*  Testing/Procedures: You are scheduled for your echocardiogram this Friday. There are no restrictions for this appointment.  Dr. Burt Knack recommends you wear a ZIO MONITOR for 14 days.  Follow-Up: Your provider recommends that you schedule a follow-up appointment AS NEEDED pending study results.    ZIO XT- Long Term Monitor Instructions   Your physician has requested you wear a ZIO patch monitor for 14 days.  This is a single patch monitor.   [employer] supplies one patch monitor per enrollment. Additional stickers are not available. Please do not apply patch if you will be having a Nuclear Stress Test, Echocardiogram, Cardiac CT, MRI, or Chest Xray during the period you would be wearing the monitor. The patch cannot be worn during these tests. You cannot remove and re-apply the ZIO XT patch monitor.  Your ZIO patch monitor will be sent Fed Ex from [employer] directly to your home address. It may take 3-5 days to receive your monitor after you have been enrolled.  Once you have received your monitor, please review the enclosed instructions. Your monitor has already been registered assigning a specific monitor serial # to you.  Billing and Patient Assistance Program Information   We have supplied [employer] with any of your insurance information on file for billing purposes. [employer] offers a sliding scale Patient Assistance Program for patients that do not have insurance, or whose insurance does not completely cover the cost of the ZIO monitor.   You must apply for the Patient Assistance Program to qualify for this discounted rate.     To apply, please call [employer] at [phone], select option 4, then select  option 2, and ask to apply for Patient Assistance Program.  Theodore Demark will ask your household income, and how many people are in your household.  They will quote your out-of-pocket cost based on that information.  [employer] will also be able to set up a 57-month, interest-free payment plan if needed.  Applying the monitor   Shave hair from upper left chest.  Hold abrader disc by orange tab. Rub abrader in 40 strokes over the upper left chest as indicated in your monitor instructions.  Clean area with 4 enclosed alcohol pads. Let dry.  Apply patch as indicated in monitor instructions. Patch will be placed under collarbone on left side of chest with arrow pointing upward.  Rub patch adhesive wings for 2 minutes. Remove white label marked "1". Remove the white label marked "2". Rub patch adhesive wings for 2 additional minutes.  While looking in a mirror, press and release button in center of patch. A small green light will flash 3-4 times. This will be your only indicator that the monitor has been turned on. ?  Do not shower for the first 24 hours. You may shower after the first 24 hours.  Press the button if you feel a symptom. You will hear a small click. Record Date, Time and Symptom in the Patient Logbook.  When you are ready to remove the patch, follow instructions on the last 2 pages of the Patient Logbook. Stick patch monitor onto the last page of Patient Logbook.  Place Patient Logbook in the blue and white box.  Use locking tab on box and tape box closed  securely.  The blue and white box has prepaid postage on it. Please place it in the mailbox as soon as possible. Your physician should have your test results approximately 7 days after the monitor has been mailed back to [facility].  Call [facility] at [phone] if you have questions regarding your ZIO XT patch monitor. Call them immediately if you see an orange light blinking on your monitor.  If your monitor falls off in  less than 4 days, contact our Monitor department at [phone]. ?If your monitor becomes loose or falls off after 4 days call [employer] at [phone] for suggestions on securing your monitor.?

## 2021-02-12 NOTE — Progress Notes (Signed)
Cardiology Office Note:    Date:  02/12/2021   ID:  Katrina, Benitez 15-Dec-1945, MRN [id number]  PCP:  Orma Flaming, MD   [facility] Prov[facility] Cardiologist:  Sherren Mocha, MD     Referring MD: Orma Flaming, MD   Chief Complaint  Patient presents with  . Loss of Consciousness    History of Present Illness:    Katrina Benitez is a 75 y.o. female with a hx of paroxysmal atrial fibrillation, presenting for evaluation of syncope.  The patient had atrial fibrillation in the setting of an acute pulmonary infection in 2020.  Other than this occurrence during an acute illness, she has had no further problems.  Anticoagulation was eventually discontinued and she weaned herself off of metoprolol.  She was last seen here November 04, 2019 and was doing well at that time.  Other cardiac issues include the presence of mild aortic valve insufficiency.  The patient is here today for evaluation of syncope. She states that she is a 'faller.' She had a bad mechanical fall one year ago and injured her right leg and both feet. She has developed swelling in both feet, left worse than right. She elevates her legs as tolerated, using the [employer] device. She is here today because she called in about a syncopal episode about 2 weeks. She had been up on her feet, and without warning she feel to the ground. She denies any prodromal symptoms. There was no seizure like activity. She denied loss of bowel or bladder function and denied tongue biting or any significant injury. She has had no problems since this episode.   Past Medical History:  Diagnosis Date  . Allergy   . Anemia   . Anxiety   . Arthritis    degenerative disc disorder  . Cardiac arrhythmia   . Depression   . Diverticulitis   . Diverticulosis   . GERD (gastroesophageal reflux disease)   . Heart murmur   . HTN (hypertension)   . Infertility, female   . Neuromuscular disorder ([place])    idiopathic peripheral  neuropathy  .  Peripheral neuropathy   . Positive PPD   . SIADH (syndrome of inappropriate ADH production) ([place])     Past Surgical History:  Procedure Laterality Date  . BREAST BIOPSY Left   . BREAST EXCISIONAL BIOPSY Left    benign  . CERVICAL BIOPSY  W/ LOOP ELECTRODE EXCISION    . CERVICAL POLYPECTOMY  02/2016  . COLONOSCOPY    . COLPOSCOPY    . DILATION AND CURETTAGE OF UTERUS    . HYSTEROSCOPY    . LUMBAR DISC SURGERY    . LUMBAR LAMINECTOMY    . TONSILLECTOMY    . UPPER GASTROINTESTINAL ENDOSCOPY      Current Medications: Current Meds  Medication Sig  . betamethasone valerate (VALISONE) 0.1 % cream Apply topically 2 (two) times daily.  Marland Kitchen buPROPion (WELLBUTRIN XL) 150 MG 24 hr tablet TAKE ONE TABLET EACH DAY  . cholecalciferol (VITAMIN D3) 10 MCG (400 UNIT) TABS tablet Take 400 Units by mouth.  . clobetasol (TEMOVATE) 0.05 % external solution APPLY TO AFFECTED AREA UPTO TWICE DAILY AS NEEDED (NOT TO FACE, GROIN, OR UNDERARMS)  . diazepam (VALIUM) 10 MG tablet TAKE ONE TABLET TWICE DAILY AS NEEDED FOR ANXIETY  . diphenoxylate-atropine (LOMOTIL) 2.5-0.025 MG tablet Take 1 tablet by mouth 4 (four) times daily as needed for diarrhea or loose stools.  . gabapentin (NEURONTIN) 600 MG tablet TAKE ONE TABLET  THREE TIMES DAILY  . hydrOXYzine (VISTARIL) 25 MG capsule Take 1 capsule (25 mg total) by mouth every 6 (six) hours as needed.  Marland Kitchen omeprazole (PRILOSEC) 40 MG capsule TAKE ONE CAPSULE EACH DAY  . ondansetron (ZOFRAN-ODT) 8 MG disintegrating tablet TAKE ONE TABLET EVERY EIGHT HOURS AS NEEDED FOR NAUSEA AND VOMITING  . Probiotic Product (ALIGN PO) Take by mouth daily.  . sodium chloride 1 g tablet Take 1 g by mouth daily.   Marland Kitchen zolpidem (AMBIEN) 10 MG tablet Take 1 tablet (10 mg total) by mouth at bedtime as needed.     Allergies:   Metronidazole, Fentanyl, Ciprofloxacin, Dilaudid [hydromorphone hcl], and Visipaque [iodixanol]   Social History   Socioeconomic History  . Marital status:  Married    Spouse name: Jenny Reichmann  . Number of children: 0  . Years of education: College  . Highest education level: Not on file  Occupational History  . Occupation: retired Engineer, drilling  Tobacco Use  . Smoking status: Never Smoker  . Smokeless tobacco: Never Used  Vaping Use  . Vaping Use: Never used  Substance and Sexual Activity  . Alcohol use: Yes    Alcohol/week: 14.0 standard drinks    Types: 14 Glasses of wine per week  . Drug use: No  . Sexual activity: Not Currently    Birth control/protection: Post-menopausal  Other Topics Concern  . Not on file  Social History Narrative   Lives at home with her husband.   Right-handed.   1-2 cups caffeine per day.   Social Determinants of Health   Financial Resource Strain: Not on file  Food Insecurity: Not on file  Transportation Needs: Not on file  Physical Activity: Not on file  Stress: Not on file  Social Connections: Not on file     Family History: The patient's family history includes Breast cancer in her mother; Depression in her brother and father; Early death in her father; Glaucoma in her mother; Heart attack in her father; Heart disease in her father; Hypertension in her brother; Miscarriages / [place] in her mother; Prostate cancer in her paternal grandfather; Tuberculosis in her paternal grandmother. There is no history of Colon cancer, Esophageal cancer, Rectal cancer, or Stomach cancer.  ROS:   Please see the history of present illness.    All other systems reviewed and are negative.  EKGs/Labs/Other Studies Reviewed:    The following studies were reviewed today: Echo 01/21/2019: IMPRESSIONS    1. The left ventricle has normal systolic function with an ejection  fraction of 60-65%. The cavity size was normal. Indeterminate diastolic  filling due to E-A fusion.  2. The right ventricle has normal systolic function. The cavity was  mildly enlarged. There is no increase in right ventricular wall thickness.   3. Right atrial size was mildly dilated.  4. The mitral valve is abnormal. Mild thickening of the mitral valve  leaflet.  5. The aortic valve is tricuspid. Mild thickening of the aortic valve.  Aortic valve regurgitation is mild by color flow Doppler.   ZIO monitor 03/31/2019: Study Highlights  1.  The basic rhythm is normal sinus 2.  There are short supraventricular runs but no evidence of atrial fibrillation or flutter 3.  There are rare isolated PVCs 4.  There are no sustained arrhythmias or pathologic pauses  EKG:  EKG is ordered today.  The ekg ordered today demonstrates NSR 78 bpm, within normal limits  Recent Labs: 09/12/2020: ALT 17 12/27/2020: BUN 16; Creatinine, Ser 1.01; Hemoglobin  11.2; Platelets 315.0; Potassium 4.7; Sodium 132; TSH 1.55  Recent Lipid Panel    Component Value Date/Time   CHOL 231 (H) 12/27/2020 1426   TRIG 90.0 12/27/2020 1426   HDL 111.90 12/27/2020 1426   CHOLHDL 2 12/27/2020 1426   VLDL 18.0 12/27/2020 1426   LDLCALC 101 (H) 12/27/2020 1426   LDLCALC 99 07/13/2020 1500     Risk Assessment/Calculations:    CHA2DS2-VASc Score = 3  This indicates a 3.2% annual risk of stroke. The patient's score is based upon: CHF History: No HTN History: Yes Diabetes History: No Stroke History: No Vascular Disease History: No Age Score: 1 Gender Score: 1      Physical Exam:    VS:  Ht 5\' 6"  (1.676 m)   LMP  (LMP Unknown)   SpO2 97%   BMI 30.67 kg/m     Wt Readings from Last 3 Encounters:  01/09/21 190 lb 0.6 oz (86.2 kg)  11/16/20 190 lb 0.6 oz (86.2 kg)  03/21/20 190 lb (86.2 kg)     GEN:  Well nourished, well developed in no acute distress HEENT: Normal NECK: No JVD; No carotid bruits LYMPHATICS: No lymphadenopathy CARDIAC: RRR, no murmurs, rubs, gallops RESPIRATORY:  Clear to auscultation without rales, wheezing or rhonchi  ABDOMEN: Soft, non-tender, non-distended MUSCULOSKELETAL: Trace edema of the right leg, 1+ edema of the left  ankle and dorsum of the foot; No deformity  SKIN: Warm and dry NEUROLOGIC:  Alert and oriented x 3 PSYCHIATRIC:  Normal affect   ASSESSMENT:    1. Syncope and collapse   2. Paroxysmal atrial fibrillation (HCC)   3. Aortic valve insufficiency, etiology of cardiac valve disease unspecified, mild   4. Chronic venous insufficiency    PLAN:    In order of problems listed above:  1. EKG is unrevealing, demonstrating normal sinus rhythm with no significant conduction disease.  The patient's symptoms were abrupt and somewhat concerning for arrhythmia.  I have recommended an echocardiogram and a 2-week ZIO monitor for further evaluation.  The patient understands the 72-month driving restriction in the state of [place] after an episode of syncope.  We discussed this today.  She understands to contact us if syncope recurs. 2. Check monitor.  No symptomatic recurrence.  Has been off of oral anticoagulation now for some time after a previous monitor showed no recurrent atrial fibrillation. 3. No murmur on exam.  Continue observation. 4. Some characteristics of lymphedema with swelling involving the dorsum of the left foot.  Her swelling is mild to moderate.  We discussed physical therapy referral but physical therapy for treatment of non-malignancy related edema/lymphedema in the [facility] system is limited to [place] and [place].  The patient does not wish to pursue this at this time.  She will continue with leg elevation.         Medication Adjustments/Labs and Tests Ordered: Current medicines are reviewed at length with the patient today.  Concerns regarding medicines are outlined above.  Orders Placed This Encounter  Procedures  . LONG TERM MONITOR (3-14 DAYS)  . EKG 12-Lead   No orders of the defined types were placed in this encounter.   Patient Instructions  Medication Instructions:  Your provider recommends that you continue on your current medications as directed. Please  refer to the Current Medication list given to you today.   *If you need a refill on your cardiac medications before your next appointment, please call your pharmacy*  Testing/Procedures: You are scheduled for your  echocardiogram this Friday. There are no restrictions for this appointment.  Dr. Burt Knack recommends you wear a ZIO MONITOR for 14 days.  Follow-Up: Your provider recommends that you schedule a follow-up appointment AS NEEDED pending study results.    ZIO XT- Long Term Monitor Instructions   Your physician has requested you wear a ZIO patch monitor for 14 days.  This is a single patch monitor.   [employer] supplies one patch monitor per enrollment. Additional stickers are not available. Please do not apply patch if you will be having a Nuclear Stress Test, Echocardiogram, Cardiac CT, MRI, or Chest Xray during the period you would be wearing the monitor. The patch cannot be worn during these tests. You cannot remove and re-apply the ZIO XT patch monitor.  Your ZIO patch monitor will be sent Fed Ex from [employer] directly to your home address. It may take 3-5 days to receive your monitor after you have been enrolled.  Once you have received your monitor, please review the enclosed instructions. Your monitor has already been registered assigning a specific monitor serial # to you.  Billing and Patient Assistance Program Information   We have supplied [employer] with any of your insurance information on file for billing purposes. [employer] offers a sliding scale Patient Assistance Program for patients that do not have insurance, or whose insurance does not completely cover the cost of the ZIO monitor.   You must apply for the Patient Assistance Program to qualify for this discounted rate.     To apply, please call [employer] at [phone], select option 4, then select option 2, and ask to apply for Patient Assistance Program.  Theodore Demark will ask your household income, and how many people  are in your household.  They will quote your out-of-pocket cost based on that information.  [employer] will also be able to set up a 21-month, interest-free payment plan if needed.  Applying the monitor   Shave hair from upper left chest.  Hold abrader disc by orange tab. Rub abrader in 40 strokes over the upper left chest as indicated in your monitor instructions.  Clean area with 4 enclosed alcohol pads. Let dry.  Apply patch as indicated in monitor instructions. Patch will be placed under collarbone on left side of chest with arrow pointing upward.  Rub patch adhesive wings for 2 minutes. Remove white label marked "1". Remove the white label marked "2". Rub patch adhesive wings for 2 additional minutes.  While looking in a mirror, press and release button in center of patch. A small green light will flash 3-4 times. This will be your only indicator that the monitor has been turned on. ?  Do not shower for the first 24 hours. You may shower after the first 24 hours.  Press the button if you feel a symptom. You will hear a small click. Record Date, Time and Symptom in the Patient Logbook.  When you are ready to remove the patch, follow instructions on the last 2 pages of the Patient Logbook. Stick patch monitor onto the last page of Patient Logbook.  Place Patient Logbook in the blue and white box.  Use locking tab on box and tape box closed securely.  The blue and white box has prepaid postage on it. Please place it in the mailbox as soon as possible. Your physician should have your test results approximately 7 days after the monitor has been mailed back to [facility]  Call [place] at [phone] if you have  questions regarding your ZIO XT patch monitor. Call them immediately if you see an orange light blinking on your monitor.  If your monitor falls off in less than 4 days, contact our Monitor department at [phone]. ?If your monitor becomes loose or falls off after 4  days call [employer] at [phone] for suggestions on securing your monitor.?     Signed, Sherren Mocha, MD  02/12/2021 4:33 PM    [facility]

## 2021-02-12 NOTE — Progress Notes (Signed)
Enrolled patient for a 14 day Zio XT Monitor to be mailed to patients home  

## 2021-02-13 LAB — BASIC METABOLIC PANEL
BUN: 20 mg/dL (ref 6–23)
CO2: 28 mEq/L (ref 19–32)
Calcium: 8.9 mg/dL (ref 8.4–10.5)
Chloride: 99 mEq/L (ref 96–112)
Creatinine, Ser: 1.12 mg/dL (ref 0.40–1.20)
GFR: 48.34 mL/min — ABNORMAL LOW (ref >60.00)
Glucose, Bld: 97 mg/dL (ref 70–99)
Potassium: 4.7 mEq/L (ref 3.5–5.1)
Sodium: 134 mEq/L — ABNORMAL LOW (ref 135–145)

## 2021-02-15 ENCOUNTER — Ambulatory Visit (HOSPITAL_COMMUNITY): Payer: Medicare Other | Attending: Cardiovascular Disease

## 2021-02-15 ENCOUNTER — Other Ambulatory Visit: Payer: Self-pay

## 2021-02-15 DIAGNOSIS — R55 Syncope and collapse: Secondary | ICD-10-CM | POA: Insufficient documentation

## 2021-02-15 DIAGNOSIS — L299 Pruritus, unspecified: Secondary | ICD-10-CM | POA: Diagnosis not present

## 2021-02-15 DIAGNOSIS — L570 Actinic keratosis: Secondary | ICD-10-CM | POA: Diagnosis not present

## 2021-02-15 LAB — ECHOCARDIOGRAM COMPLETE
Area-P 1/2: 3 cm2
P 1/2 time: 600 msec
S' Lateral: 2.45 cm

## 2021-02-20 DIAGNOSIS — R55 Syncope and collapse: Secondary | ICD-10-CM

## 2021-02-21 ENCOUNTER — Telehealth (INDEPENDENT_AMBULATORY_CARE_PROVIDER_SITE_OTHER): Payer: Medicare Other | Admitting: Family Medicine

## 2021-02-21 ENCOUNTER — Other Ambulatory Visit: Payer: Self-pay | Admitting: Internal Medicine

## 2021-02-21 DIAGNOSIS — K30 Functional dyspepsia: Secondary | ICD-10-CM | POA: Diagnosis not present

## 2021-02-21 NOTE — Patient Instructions (Addendum)
-  continue your acid reflux medication if you feel that stopping it not an option  -seek inperson care if recurrent abdominal upset, nausea, vomiting or diarrhea  -follow up with dermatologist as planned  -the instructions for stopping WellbutrinXL are as follows: taper or discontinue when mood is stable and in the spring preferably, monitor for worsening mood and follow up with your doctor or seek prompt medical care if worsening depression or other concerns. If taking the higher dose of 300mg  daily, taper to 150mg  daily for 2 weeks prior to stopping. Do not cut/crush/chew tablet.    I hope you are feeling better soon!  Seek in person care promptly if your symptoms worsen, new concerns arise or you are not improving with treatment.  It was nice to meet you today. I help [facility] out with telemedicine visits on Tuesdays and Thursdays and am available for visits on those days. If you have any concerns or questions following this visit please schedule a follow up visit with your Primary Care doctor or seek care at a local urgent care clinic to avoid delays in care.

## 2021-02-21 NOTE — Progress Notes (Signed)
Virtual Visit via Video Note  I connected with Katrina Benitez  on 02/21/21 at  5:00 PM EDT by a video enabled telemedicine application and verified that I am speaking with the correct person using two identifiers.  Location patient: home, [place] Location provider:work or home office Persons participating in the virtual visit: patient, provider  I discussed the limitations of evaluation and management by telemedicine and the availability of in person appointments. The patient expressed understanding and agreed to proceed.   HPI:  Acute telemedicine visit for medication questions: -seeing dermatologist for chronic pruritis  -dermatologist at [facility] advised recently that she DC the wellbutrin and the omeprazole  -she stopped these but had stomach upset (diarrhea and nausea) so restarted -she is concerned about tapering the wellbutrin and how to do this - takes 150XL daily -she sees dermatology in follow up next week  ROS: See pertinent positives and negatives per HPI.  Past Medical History:  Diagnosis Date  . Allergy   . Anemia   . Anxiety   . Arthritis    degenerative disc disorder  . Cardiac arrhythmia   . Depression   . Diverticulitis   . Diverticulosis   . GERD (gastroesophageal reflux disease)   . Heart murmur   . HTN (hypertension)   . Infertility, female   . Neuromuscular disorder ([place])    idiopathic peripheral  neuropathy  . Peripheral neuropathy   . Positive PPD   . SIADH (syndrome of inappropriate ADH production) ([place])     Past Surgical History:  Procedure Laterality Date  . BREAST BIOPSY Left   . BREAST EXCISIONAL BIOPSY Left    benign  . CERVICAL BIOPSY  W/ LOOP ELECTRODE EXCISION    . CERVICAL POLYPECTOMY  02/2016  . COLONOSCOPY    . COLPOSCOPY    . DILATION AND CURETTAGE OF UTERUS    . HYSTEROSCOPY    . LUMBAR DISC SURGERY    . LUMBAR LAMINECTOMY    . TONSILLECTOMY    . UPPER GASTROINTESTINAL ENDOSCOPY       Current Outpatient Medications:  .   betamethasone valerate (VALISONE) 0.1 % cream, Apply topically 2 (two) times daily., Disp: 45 g, Rfl: 1 .  buPROPion (WELLBUTRIN XL) 150 MG 24 hr tablet, TAKE ONE TABLET EACH DAY, Disp: 90 tablet, Rfl: 1 .  cholecalciferol (VITAMIN D3) 10 MCG (400 UNIT) TABS tablet, Take 400 Units by mouth., Disp: , Rfl:  .  clobetasol (TEMOVATE) 0.05 % external solution, APPLY TO AFFECTED AREA UPTO TWICE DAILY AS NEEDED (NOT TO FACE, GROIN, OR UNDERARMS), Disp: , Rfl:  .  diazepam (VALIUM) 10 MG tablet, TAKE ONE TABLET TWICE DAILY AS NEEDED FOR ANXIETY, Disp: 60 tablet, Rfl: 0 .  diphenoxylate-atropine (LOMOTIL) 2.5-0.025 MG tablet, Take 1 tablet by mouth 4 (four) times daily as needed for diarrhea or loose stools., Disp: 120 tablet, Rfl: 1 .  gabapentin (NEURONTIN) 600 MG tablet, TAKE ONE TABLET THREE TIMES DAILY, Disp: 270 tablet, Rfl: 1 .  hydrOXYzine (VISTARIL) 25 MG capsule, Take 1 capsule (25 mg total) by mouth every 6 (six) hours as needed., Disp: 360 capsule, Rfl: 0 .  omeprazole (PRILOSEC) 40 MG capsule, TAKE ONE CAPSULE EACH DAY, Disp: 90 capsule, Rfl: 3 .  ondansetron (ZOFRAN-ODT) 8 MG disintegrating tablet, TAKE ONE TABLET EVERY EIGHT HOURS AS NEEDED FOR NAUSEA AND VOMITING, Disp: 20 tablet, Rfl: 1 .  Probiotic Product (ALIGN PO), Take by mouth daily., Disp: , Rfl:  .  sodium chloride 1 g tablet, Take  1 g by mouth daily. , Disp: , Rfl:  .  zolpidem (AMBIEN) 10 MG tablet, TAKE ONE TABLET AT BEDTIME AS NEEDED, Disp: 90 tablet, Rfl: 0  EXAM:  VITALS per patient if applicable:  GENERAL: alert, oriented, appears well and in no acute distress  HEENT: atraumatic, conjunttiva clear, no obvious abnormalities on inspection of external nose and ears  NECK: normal movements of the head and neck  LUNGS: on inspection no signs of respiratory distress, breathing rate appears normal, no obvious gross SOB, gasping or wheezing  CV: no obvious cyanosis  MS: moves all visible extremities without noticeable  abnormality  PSYCH/NEURO: pleasant and cooperative, no obvious depression or anxiety, speech and thought processing grossly intact  ASSESSMENT AND PLAN:  Discussed the following assessment and plan:  Stomach upset  Query gastroenteritis vs cessation of PPI vs more likely than withdrawal as cause of stomach upset. Had lengthy discussion about the wellbutrinXL as she is concerned about withdrawal but really wants to stop this drug. 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 Did advise should stop this drug when mood is stable and in the spring is best.  From everything I can find, the tapering instructions are to taper to 150mg  for 1-2 weeks and then stop if taking the higher dose, shared this info with her. She wants to try every other day for 1-2 weeks before stopping - told her this is not what is recommended and I am not sure how this would impact her. Did suggest she may want to continue her PPI. Plans to follow up with derm on Monday as planned. Did advise abd upset could be other issues and discussed potential etiologies. Advised covid testing given current surge in community.  Advised to seek prompt in person care if recurrent abd issues, worsening, has any further concerns or new symptoms arise,.   I discussed the assessment and treatment plan with the patient. > 20 minutes spent with this patient.  The patient was provided an opportunity to ask questions and all were answered. The patient agreed with the plan and demonstrated an understanding of the instructions.     Katrina Kern, DO

## 2021-02-22 ENCOUNTER — Telehealth: Payer: Self-pay | Admitting: Cardiovascular Disease

## 2021-02-22 NOTE — Telephone Encounter (Signed)
Patient states she is having a problem with the heart monitor on her skin. She states she has only had it on for a few days. She states she has had a strange itching on her skin for several years and put cortisone on it, but it's only getting worse. She states she cannot go through the weekend with this and needs a call back as soon as possible.

## 2021-02-25 ENCOUNTER — Other Ambulatory Visit: Payer: Self-pay | Admitting: Family Medicine

## 2021-02-25 ENCOUNTER — Telehealth: Payer: Self-pay

## 2021-02-25 DIAGNOSIS — L298 Other pruritus: Secondary | ICD-10-CM | POA: Diagnosis not present

## 2021-02-25 MED ORDER — DIAZEPAM 10 MG PO TABS: 10.0000 mg | ORAL_TABLET | Freq: Two times a day (BID) | ORAL | 0 refills | Status: DC | PRN

## 2021-02-25 NOTE — Telephone Encounter (Signed)
Pt requesting Diazepam 10 mg tab LOV: 01/09/2021 No future visits scheduled  Last refill: 01/23/2021

## 2021-02-25 NOTE — Telephone Encounter (Signed)
..   LAST APPOINTMENT DATE: 02/12/2021   NEXT APPOINTMENT DATE:@Visit  date not found  MEDICATION:diazepam (VALIUM) 10 MG tablet   PHARMACY: [facility] [place] - [address]      CAN WE PLEASE CALL PATIENT - when sent.

## 2021-02-26 NOTE — Telephone Encounter (Signed)
Patient notified Rx send to pharmacy, need to schedule appointment for [facility]

## 2021-03-12 NOTE — Telephone Encounter (Signed)
Returned call to patient she said she had an allergic reaction to her Zio monitor. She was able to wear it in the past but has progressively gotten a worse skin condition and was unable to wear it. She thinks she had it on around 2.5 days (Zio processing data now).   What would you recommend? She offered to try the Zio again without abrading her skin? We could also switch her to a preventice long term monitor with sensitive skin? Or do you want to wait to see what the 1st monitor shows (possibly only 2 days data) and decided from there. Thanks, Drucie Opitz

## 2021-03-12 NOTE — Telephone Encounter (Signed)
Pt is calling back to get other options for hypo allergic monitors

## 2021-03-13 DIAGNOSIS — R55 Syncope and collapse: Secondary | ICD-10-CM | POA: Diagnosis not present

## 2021-03-18 NOTE — Telephone Encounter (Signed)
Because this was ordered to evaluate syncope, I would recommend a 30 day monitor if you think she could tolerate this with her skin reaction. Thank you.

## 2021-03-21 ENCOUNTER — Other Ambulatory Visit: Payer: Self-pay | Admitting: Radiology

## 2021-03-21 DIAGNOSIS — R55 Syncope and collapse: Secondary | ICD-10-CM

## 2021-03-22 NOTE — Telephone Encounter (Signed)
Late entry... Spoke with patient on 6/30 and made her aware of Dr. York Cerise recommendations and ordered her a 30 day [employer] Event Monitor. Patient knows to expect the monitor to arrive in 4-5 days and to call us if she needs help applying it.

## 2021-04-01 ENCOUNTER — Other Ambulatory Visit: Payer: Self-pay | Admitting: Obstetrics & Gynecology

## 2021-04-01 DIAGNOSIS — Z1231 Encounter for screening mammogram for malignant neoplasm of breast: Secondary | ICD-10-CM

## 2021-04-03 ENCOUNTER — Telehealth: Payer: Self-pay | Admitting: Cardiovascular Disease

## 2021-04-03 NOTE — Telephone Encounter (Signed)
New Message:     Please call, having problems with getting her Monitor on. She thinks she is missing some parts that she need.

## 2021-04-03 NOTE — Telephone Encounter (Signed)
Patient scheduled to come in for tutorial and have her monitor applied Thursday, 04/04/21, at 3:30 PM.

## 2021-04-04 ENCOUNTER — Other Ambulatory Visit: Payer: Self-pay | Admitting: Family Medicine

## 2021-04-04 ENCOUNTER — Other Ambulatory Visit: Payer: Self-pay

## 2021-04-04 ENCOUNTER — Ambulatory Visit (INDEPENDENT_AMBULATORY_CARE_PROVIDER_SITE_OTHER): Payer: Medicare Other

## 2021-04-04 DIAGNOSIS — R55 Syncope and collapse: Secondary | ICD-10-CM

## 2021-04-04 NOTE — Progress Notes (Unsigned)
Monitor mailed to patient 03/21/21. Monitor applied in office 04/04/21. Patient given strips, bridge , saline wipes and 3 different types of electrodes to try if she has skin reaction to strips.

## 2021-04-09 ENCOUNTER — Other Ambulatory Visit: Payer: Self-pay

## 2021-04-09 DIAGNOSIS — E871 Hypo-osmolality and hyponatremia: Secondary | ICD-10-CM

## 2021-04-17 ENCOUNTER — Other Ambulatory Visit (HOSPITAL_COMMUNITY)
Admission: RE | Admit: 2021-04-17 | Discharge: 2021-04-17 | Disposition: A | Discharge status: Home / Self Care | Payer: Medicare Other | Source: Ambulatory Visit | Attending: Obstetrics & Gynecology | Admitting: Obstetrics & Gynecology

## 2021-04-17 ENCOUNTER — Ambulatory Visit (HOSPITAL_BASED_OUTPATIENT_CLINIC_OR_DEPARTMENT_OTHER)
Admission: RE | Admit: 2021-04-17 | Discharge: 2021-04-17 | Disposition: A | Discharge status: Home / Self Care | Payer: Medicare Other | Source: Ambulatory Visit | Attending: Obstetrics & Gynecology | Admitting: Obstetrics & Gynecology

## 2021-04-17 ENCOUNTER — Ambulatory Visit (INDEPENDENT_AMBULATORY_CARE_PROVIDER_SITE_OTHER): Payer: Medicare Other | Admitting: Obstetrics & Gynecology

## 2021-04-17 ENCOUNTER — Encounter (HOSPITAL_BASED_OUTPATIENT_CLINIC_OR_DEPARTMENT_OTHER): Payer: Self-pay | Admitting: Obstetrics & Gynecology

## 2021-04-17 ENCOUNTER — Other Ambulatory Visit: Payer: Self-pay

## 2021-04-17 VITALS — BP 130/72 | HR 94 | Ht 66.0 in

## 2021-04-17 DIAGNOSIS — Z01419 Encounter for gynecological examination (general) (routine) without abnormal findings: Secondary | ICD-10-CM | POA: Diagnosis not present

## 2021-04-17 DIAGNOSIS — D251 Intramural leiomyoma of uterus: Secondary | ICD-10-CM

## 2021-04-17 DIAGNOSIS — R102 Pelvic and perineal pain: Secondary | ICD-10-CM | POA: Insufficient documentation

## 2021-04-17 DIAGNOSIS — Z124 Encounter for screening for malignant neoplasm of cervix: Secondary | ICD-10-CM | POA: Insufficient documentation

## 2021-04-17 DIAGNOSIS — M8580 Other specified disorders of bone density and structure, unspecified site: Secondary | ICD-10-CM | POA: Diagnosis not present

## 2021-04-17 DIAGNOSIS — D259 Leiomyoma of uterus, unspecified: Secondary | ICD-10-CM | POA: Diagnosis not present

## 2021-04-17 DIAGNOSIS — N94819 Vulvodynia, unspecified: Secondary | ICD-10-CM

## 2021-04-17 DIAGNOSIS — Z9189 Other specified personal risk factors, not elsewhere classified: Secondary | ICD-10-CM

## 2021-04-17 NOTE — Progress Notes (Signed)
75 y.o. Katrina Benitez Married White or Caucasian female here for breast and pelvic exam.  I am also following her for increased lifetime risk of breast cancer.  Tyrer Cusick model done last year.  Lifetime breast cancer risk is 11.6%.  had breast MRI done at [facility].  Needed biopsy.  This was benign.  Now needs scheduling for screening MMG.  Could not get scheduled at [facility] as they do not have all of her records. She is scheduled at [facility] for screening.  Reports pelvic pain and would like ultrasound scheduled if possible.  Denies vaginal bleeding.  No LMP recorded (lmp unknown). Patient is postmenopausal.           Health Maintenance: PCP:  Orma Flaming, MD.  Last wellness appt was 12/2020.  Did blood work at that appt:   Vaccines are up to date:  yes Colonoscopy:  2020, 2 adenomatous polyps MMG:  pt does have scheduled at [facility].  Will see if can have done here at  BMD:  03/28/2019, T score -2.1 Last pap smear:  obtained today.   H/o abnormal pap smear:  no   reports that she has never smoked. She has never used smokeless tobacco. She reports current alcohol use of about 14.0 standard drinks of alcohol per week. She reports that she does not use drugs.  Past Medical History:  Diagnosis Date   Allergy    Anemia    Anxiety    Arthritis    degenerative disc disorder   Cardiac arrhythmia    Depression    Diverticulitis    Diverticulosis    GERD (gastroesophageal reflux disease)    Heart murmur    HTN (hypertension)    Infertility, female    Neuromuscular disorder (HCC)    idiopathic peripheral  neuropathy   Peripheral neuropathy    Positive PPD    SIADH (syndrome of inappropriate ADH production) ([place])     Past Surgical History:  Procedure Laterality Date   BREAST BIOPSY Left    BREAST EXCISIONAL BIOPSY Left    benign   CERVICAL BIOPSY  W/ LOOP ELECTRODE EXCISION     CERVICAL POLYPECTOMY  02/2016   COLONOSCOPY     COLPOSCOPY     DILATION AND CURETTAGE OF UTERUS     HYSTEROSCOPY      LUMBAR DISC SURGERY     LUMBAR LAMINECTOMY     TONSILLECTOMY     UPPER GASTROINTESTINAL ENDOSCOPY      Current Outpatient Medications  Medication Sig Dispense Refill   betamethasone valerate (VALISONE) 0.1 % cream Apply topically 2 (two) times daily. 45 g 1   buPROPion (WELLBUTRIN XL) 150 MG 24 hr tablet TAKE ONE TABLET EACH DAY 90 tablet 1   cholecalciferol (VITAMIN D3) 10 MCG (400 UNIT) TABS tablet Take 400 Units by mouth.     clobetasol (TEMOVATE) 0.05 % external solution APPLY TO AFFECTED AREA UPTO TWICE DAILY AS NEEDED (NOT TO FACE, GROIN, OR UNDERARMS)     diazepam (VALIUM) 10 MG tablet TAKE ONE TABLET EVERY 12 HOURS AS NEEDEDFOR ANXIETY 60 tablet 5   diphenoxylate-atropine (LOMOTIL) 2.5-0.025 MG tablet Take 1 tablet by mouth 4 (four) times daily as needed for diarrhea or loose stools. 120 tablet 1   gabapentin (NEURONTIN) 600 MG tablet TAKE ONE TABLET THREE TIMES DAILY 270 tablet 1   hydrOXYzine (VISTARIL) 25 MG capsule Take 1 capsule (25 mg total) by mouth every 6 (six) hours as needed. 360 capsule 0   omeprazole (PRILOSEC) 40  MG capsule TAKE ONE CAPSULE EACH DAY 90 capsule 1   ondansetron (ZOFRAN-ODT) 8 MG disintegrating tablet TAKE ONE TABLET EVERY EIGHT HOURS AS NEEDED FOR NAUSEA AND VOMITING 20 tablet 1   Probiotic Product (ALIGN PO) Take by mouth daily.     sodium chloride 1 g tablet Take 1 g by mouth daily.      zolpidem (AMBIEN) 10 MG tablet TAKE ONE TABLET AT BEDTIME AS NEEDED 90 tablet 0   No current facility-administered medications for this visit.    Family History  Problem Relation Age of Onset   Breast cancer Mother    Glaucoma Mother    Miscarriages / Stillbirths Mother    Heart attack Father    Depression Father    Early death Father    Heart disease Father    Tuberculosis Paternal Grandmother    Prostate cancer Paternal Grandfather    Depression Brother    Hypertension Brother    Colon cancer Neg Hx    Esophageal cancer Neg Hx    Rectal cancer Neg  Hx    Stomach cancer Neg Hx     Review of Systems  Constitutional: Negative.   Gastrointestinal: Negative.   Genitourinary: Negative.    Exam:   BP 130/72 (BP Location: Right Arm, Patient Position: Sitting, Cuff Size: Large)   Pulse 94   Ht '5\' 6"'$  (1.676 m)   LMP  (LMP Unknown)   BMI 30.67 kg/m   Height: '5\' 6"'$  (167.6 cm)  General appearance: alert, cooperative and appears stated age Breasts: normal appearance, no masses or tenderness Abdomen: soft, non-tender; bowel sounds normal; no masses,  no organomegaly Lymph nodes: Cervical, supraclavicular, and axillary nodes normal.  No abnormal inguinal nodes palpated Neurologic: Grossly normal  Pelvic: External genitalia:  no lesions              Urethra:  normal appearing urethra with no masses, tenderness or lesions              Bartholins and Skenes: normal                 Vagina: normal appearing vagina with atrophic changes and no discharge, no lesions              Cervix: no lesions              Pap taken: Yes.   Bimanual Exam:  Uterus:  normal size, contour, position, consistency, mobility, non-tender              Adnexa: normal adnexa and no mass, fullness, tenderness               Rectovaginal: Confirms               Anus:  normal sphincter tone, no lesions  Chaperone, Octaviano Batty, CMA, was present for exam.  Assessment/Plan: 1. Encounter for gynecological examination without abnormal finding - pap smear guidelines reviewed.  Pt desires pap smear. - MMG is scheduled at [facility].  Pt will let me know if desires MRI after MMG is completed - BMD 03/2019.  Repeat 1-2 years - Colonoscopy 2020 - lab work done with Dr. Rogers Blocker who is now a hospitalist.  PCP options discussed. - vaccines updated/reviewed  2. Pelvic pain - US PELVIC COMPLETE WITH TRANSVAGINAL; Future  3. 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 Osteopenia, unspecified location  6. Increased risk of breast cancer

## 2021-04-18 DIAGNOSIS — Z9189 Other specified personal risk factors, not elsewhere classified: Secondary | ICD-10-CM | POA: Insufficient documentation

## 2021-04-22 ENCOUNTER — Telehealth (HOSPITAL_BASED_OUTPATIENT_CLINIC_OR_DEPARTMENT_OTHER): Payer: Self-pay

## 2021-04-22 NOTE — Telephone Encounter (Signed)
Patient wanted to ask if it was ok to continue to use Cera-Vie with pramoxine. She states that she has been using it for about 2 years and it does work.  Patient has been informed that Dr. [name] Heck is on vacation and is ok with waiting until she gets back to hear from her. tbw

## 2021-04-23 NOTE — Telephone Encounter (Signed)
Patient states she uses the cream every where including the perineum area.

## 2021-04-26 NOTE — Telephone Encounter (Signed)
DPR reviewed. LMOM at 10:41 letting patient know about recommendations per Dr. Wannetta Sender. Advised patient if she has any questions not to hesitate giving the office a call. tbw

## 2021-04-29 ENCOUNTER — Telehealth: Payer: Self-pay | Admitting: Cardiovascular Disease

## 2021-04-29 NOTE — Telephone Encounter (Signed)
Patient returned call for monitor results. Messaged nurse to see if she was available and did not receive a response. Informed patient I would send a message so the nurse could call her back when she is available. Patient was very upset. She states she had called a month ago and was told the "monitor nurse" was available "24/7", but never received a call. 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 She states "it doesn't even matter anymore" and that she "could have been in afib and died the next day". Patient then hung up.

## 2021-04-29 NOTE — Telephone Encounter (Signed)
Called patient back. Patient complained of having sensitive skin and not able to wear any of the stickers for the 30 day monitor for longer than 2 days. Patient complaining about having to call back to a number that is not the direct number of who she needs to speak to. Patient complaining about not having someone to help her 24/7 at our office like she was told when the monitor was placed at the office. Informed patient that we have on call providers after hours who deal with urgent issues. Patient stated there was suppose to be a monitor nurse she could call 24/7. Patient stated her results for the monitor not good due to her only wearing monitor for a few days. Will send message to monitor tech to see if she can help patient.

## 2021-04-29 NOTE — Telephone Encounter (Signed)
Discussed various skin reactions to ZIO patch , Preventice strips and Preventice bridge with 52M repositionable electrodes and Soft-E electrodes.  Preventice also had sensitive skin electrode and pediatric electrode.  Explained sometimes with skin sensitivity, there is no guarantee any alternative electrodes would work.   Situation discussed with Dr. Burt Knack.  He felt that based on the 2 days of data with the ZIO patch monitors and 7.5 days of data on the Preventice monitor, he felt assured her single syncopal episode was not do to arrhythmia, however, if she had another syncopal episode it may be best to consider an implanted Loop recorder. Patient was billed by [employer] $66.91 for her incomplete ZIO patch monitor.  She could not be certain after reviewing her [employer] statement that her co-insurance Christella Scheuermann had been billed.  I have contacted [employer] to confirm and see if we can get her $66.91 co-pay waved since her test was incomplete.  Awaiting response. Recent Vital Signs   '@VS'$ @   Past Medical History:  Diagnosis Date   Allergy    Anemia    Anxiety    Arthritis    degenerative disc disorder   Cardiac arrhythmia    Depression    Diverticulitis    Diverticulosis    GERD (gastroesophageal reflux disease)    Heart murmur    HTN (hypertension)    Infertility, female    Neuromuscular disorder (HCC)    idiopathic peripheral  neuropathy   Peripheral neuropathy    Positive PPD    SIADH (syndrome of inappropriate ADH production) [facility])      Expected Discharge Date  '@FLOW'$ [id number]  Diet Order     None        VTE Documentation  '@FLOW'$ [id number]   Work Intensity Score/Level of Care  '@FLOW'$ (10536::1)@  '@LEVELOFCARE'$ @   Mobility  '@FLOW'$ (7060220::1)@     Significant Events    DC Barriers   Abnormal Labs:  Markus Daft A 04/29/2021, 5:34 PM

## 2021-04-30 ENCOUNTER — Encounter (HOSPITAL_BASED_OUTPATIENT_CLINIC_OR_DEPARTMENT_OTHER): Payer: Self-pay

## 2021-04-30 DIAGNOSIS — H52203 Unspecified astigmatism, bilateral: Secondary | ICD-10-CM | POA: Diagnosis not present

## 2021-04-30 DIAGNOSIS — H2513 Age-related nuclear cataract, bilateral: Secondary | ICD-10-CM | POA: Diagnosis not present

## 2021-04-30 DIAGNOSIS — H5203 Hypermetropia, bilateral: Secondary | ICD-10-CM | POA: Diagnosis not present

## 2021-04-30 NOTE — Telephone Encounter (Signed)
Our [employer] / ZIO representative spoke with their billing department.  The balance of $66.91 was picked up by the patients [employer] medicare supplement and Ms. Sollman has a zero balance the [employer] / ZIO monitor company.  The ZIO patch although incomplete, did provide 2 days of data that was interpreted by Dr. Burt Knack and combined with her 7.5 days of data from the [employer]ice mobile cardiac telemetry monitor.  If the patient would like to proceed with retracting the insurance claims on her ZIO XT, please give me a call at [phone] or [phone].

## 2021-05-01 ENCOUNTER — Other Ambulatory Visit (HOSPITAL_BASED_OUTPATIENT_CLINIC_OR_DEPARTMENT_OTHER): Payer: Self-pay | Admitting: Obstetrics & Gynecology

## 2021-05-01 DIAGNOSIS — Z9189 Other specified personal risk factors, not elsewhere classified: Secondary | ICD-10-CM

## 2021-05-09 LAB — CYTOLOGY - PAP
Diagnosis: NEGATIVE
Diagnosis: NEGATIVE

## 2021-05-16 ENCOUNTER — Other Ambulatory Visit: Payer: Self-pay | Admitting: Family Medicine

## 2021-05-16 NOTE — Telephone Encounter (Signed)
.   Encourage patient to contact the pharmacy for refills or they can request refills through [facility]:  Please schedule appointment if longer than 1 year  NEXT APPOINTMENT DATE:  MEDICATION:gabapentin (NEURONTIN) 600 MG tablet  Is the patient out of medication?   [place], [place] - [address]

## 2021-06-11 DIAGNOSIS — Z79899 Other long term (current) drug therapy: Secondary | ICD-10-CM | POA: Diagnosis not present

## 2021-06-11 DIAGNOSIS — L298 Other pruritus: Secondary | ICD-10-CM | POA: Diagnosis not present

## 2021-06-27 DIAGNOSIS — Z9189 Other specified personal risk factors, not elsewhere classified: Secondary | ICD-10-CM | POA: Diagnosis not present

## 2021-06-27 DIAGNOSIS — R922 Inconclusive mammogram: Secondary | ICD-10-CM | POA: Diagnosis not present

## 2021-07-02 ENCOUNTER — Encounter: Payer: Medicare Other | Admitting: Family Medicine

## 2021-07-03 ENCOUNTER — Encounter (HOSPITAL_BASED_OUTPATIENT_CLINIC_OR_DEPARTMENT_OTHER): Payer: Self-pay | Admitting: Obstetrics & Gynecology

## 2021-07-03 DIAGNOSIS — Z9189 Other specified personal risk factors, not elsewhere classified: Secondary | ICD-10-CM

## 2021-07-09 ENCOUNTER — Encounter: Payer: Medicare Other | Admitting: Family Medicine

## 2021-07-30 DIAGNOSIS — L298 Other pruritus: Secondary | ICD-10-CM | POA: Diagnosis not present

## 2021-08-06 ENCOUNTER — Other Ambulatory Visit: Payer: Self-pay | Admitting: Internal Medicine

## 2021-08-13 ENCOUNTER — Ambulatory Visit: Payer: Medicare Other

## 2021-08-13 DIAGNOSIS — Z20828 Contact with and (suspected) exposure to other viral communicable diseases: Secondary | ICD-10-CM | POA: Diagnosis not present

## 2021-08-20 ENCOUNTER — Other Ambulatory Visit: Payer: Self-pay | Admitting: Family Medicine

## 2021-08-20 ENCOUNTER — Telehealth: Payer: Self-pay

## 2021-08-20 NOTE — Telephone Encounter (Signed)
  Encourage patient to contact the pharmacy for refills or they can request refills through [place]:  01/11/2021  NEXT APPOINTMENT DATE: 09/2021 -TOC to [facility]   MEDICATION: betamethasone valerate (VALISONE) 0.1 % cream  Is the patient out of medication? yes  PHARMACY: [facility] - [place], [place] - [address]   Let patient know to contact pharmacy at the end of the day to make sure medication is ready.  Please notify patient to allow 48-72 hours to process

## 2021-08-20 NOTE — Telephone Encounter (Signed)
Please advise 

## 2021-08-20 NOTE — Telephone Encounter (Signed)
Patient will call back to schedule an appointment. 

## 2021-08-20 NOTE — Telephone Encounter (Signed)
Could you please call patient and get her scheduled with Alyssa, please? Thanks

## 2021-09-22 HISTORY — PX: CATARACT EXTRACTION: SUR2

## 2021-09-26 ENCOUNTER — Encounter: Payer: Medicare Other | Admitting: Nurse Practitioner

## 2021-10-01 DIAGNOSIS — Z23 Encounter for immunization: Secondary | ICD-10-CM | POA: Diagnosis not present

## 2021-10-11 ENCOUNTER — Telehealth (HOSPITAL_BASED_OUTPATIENT_CLINIC_OR_DEPARTMENT_OTHER): Payer: Self-pay | Admitting: Obstetrics & Gynecology

## 2021-10-11 NOTE — Telephone Encounter (Signed)
Tc to pt. Pt states that she started with a some slight incontinence - "drop in the morning" upon getting up in the morning.  That increased slightly over the past few weeks and this week began with urgency of urination and incontinence with that, felt like had to go to the restroom all the time.  Began with hematuria yesterday.  Called a physician who prescribed her Pyridium and Macrobid last night.  I advised patient those are the typical medications.  Advised patient to call MD on call over weekend if ABX isnt helping or to call us Monday.  Pt worried that incontinence is more of an issue without having UTI.  I advised to continue medications prescribed and to monitor incontinence.  If continues can see Dr [name] Heck or see our urogynecologist. I will follow up with patient 10/18/21.  Dr [name] Heck advised. Pt was thankful for the phone call. KW CMA

## 2021-10-11 NOTE — Telephone Encounter (Signed)
Patient called and stated that she needs Dr.Miller or the nurse  to please call her right away.Patient stated she is bleeding and urinating everywhere and cannot come in the office.

## 2021-10-22 ENCOUNTER — Other Ambulatory Visit (INDEPENDENT_AMBULATORY_CARE_PROVIDER_SITE_OTHER): Payer: Medicare Other

## 2021-10-22 DIAGNOSIS — E871 Hypo-osmolality and hyponatremia: Secondary | ICD-10-CM | POA: Diagnosis not present

## 2021-10-23 ENCOUNTER — Telehealth: Payer: Self-pay | Admitting: Internal Medicine

## 2021-10-23 ENCOUNTER — Other Ambulatory Visit: Payer: Self-pay

## 2021-10-23 ENCOUNTER — Telehealth (HOSPITAL_BASED_OUTPATIENT_CLINIC_OR_DEPARTMENT_OTHER): Payer: Self-pay | Admitting: Obstetrics & Gynecology

## 2021-10-23 LAB — BASIC METABOLIC PANEL
BUN: 19 mg/dL (ref 6–23)
CO2: 30 mEq/L (ref 19–32)
Calcium: 9.1 mg/dL (ref 8.4–10.5)
Chloride: 99 mEq/L (ref 96–112)
Creatinine, Ser: 1.02 mg/dL (ref 0.40–1.20)
GFR: 53.82 mL/min — ABNORMAL LOW (ref >60.00)
Glucose, Bld: 92 mg/dL (ref 70–99)
Potassium: 4.6 mEq/L (ref 3.5–5.1)
Sodium: 133 mEq/L — ABNORMAL LOW (ref 135–145)

## 2021-10-23 MED ORDER — PROMETHAZINE HCL 25 MG RE SUPP: 25.0000 mg | Freq: Four times a day (QID) | RECTAL | 0 refills | Status: DC | PRN

## 2021-10-23 NOTE — Progress Notes (Signed)
I am not sure why this was sent to my box. 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  Her sodium levels are low but stable compared to previous values. Do not need to make any urgent changes but she needs to establish with a new PCP ASAP for ongoing management.  Algis Greenhouse. Jerline Pain, MD 10/23/2021 10:19 AM

## 2021-10-23 NOTE — Telephone Encounter (Signed)
Patients husband called and stated that patients zofran is not working. She has been vomiting and having nausea. Seeking advice if there is something else she can take. Please advise.

## 2021-10-23 NOTE — Telephone Encounter (Signed)
Pt's husband reports that pt's nausea and vomiting started this morning and zofran has not helped. Pt's husband wondering if there is another medication possibly by another route pt could have to help nausea and vomiting.

## 2021-10-23 NOTE — Telephone Encounter (Signed)
I need more information... Why is she vomiting? I've seen her for chronic nausea, but not vomiting. Hasn't been seen in a while. Abdominal pain? Fever? Diarrhea ? You can prescribe phenergan 25 mg PO or PR suppositories q 6 hrs; #20; no refills. Ask her what she prefers. 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  Please check on her in am and give me an update.

## 2021-10-23 NOTE — Telephone Encounter (Addendum)
Attempted to call pt but was unable to reach pt. Spoke with pt's husband. Pt's husband reports that pt is experiencing mild abd pain, diarrhea, and has no fever. Pt husband reports that a friend recently had the stomach flu and he and pt attributed symptoms to short lived gi bug. Pt's husband reported that he would monitor pt and take her to the ER if necessary. Phenergan suppositories sent to patient's preferred drug store.

## 2021-10-23 NOTE — Telephone Encounter (Signed)
Patient called and would like for the  nurse to call her and  follow up about  her UTI.

## 2021-10-23 NOTE — Telephone Encounter (Signed)
Called and spoke with patient about concerns. Advised patient per Dr. [name] Heck to come in to give a urine sample. Patient was advised that we would send urine off for a culture so that we would know how to treat the issue that she has going on.  Patient states that our office is too far for her to come and would like to know if she can do the specimen somewhere else. tbw

## 2021-10-23 NOTE — Telephone Encounter (Signed)
Pt returned call. Pt had a different report than husband. Pt states that she has had no vomiting just severe nausea with generalized abd pain. Pt reports that she has had increased vomiting over the past 3 months. Pt thought that abd pain could be from constipation. Pt reports last bowel movement was today but she doesn't feel like she has been having complete bowel movements. Pt states that she hasn't taken her temp but denies chills. Pt said she is now out of zofran and wants zofran refilled. Told pt phenergan was sent to her pharmacy and she was grateful because she states that zofran wasn't working today. Pt also wanted to know if Dr. Henrene Pastor had virtual appts available and wanted to know if he thought she should be scoped again.

## 2021-10-24 ENCOUNTER — Encounter: Payer: Self-pay | Admitting: Family Medicine

## 2021-10-24 ENCOUNTER — Telehealth (INDEPENDENT_AMBULATORY_CARE_PROVIDER_SITE_OTHER): Payer: Medicare Other | Admitting: Family Medicine

## 2021-10-24 DIAGNOSIS — R110 Nausea: Secondary | ICD-10-CM

## 2021-10-24 DIAGNOSIS — L299 Pruritus, unspecified: Secondary | ICD-10-CM | POA: Diagnosis not present

## 2021-10-24 DIAGNOSIS — F419 Anxiety disorder, unspecified: Secondary | ICD-10-CM | POA: Diagnosis not present

## 2021-10-24 DIAGNOSIS — L292 Pruritus vulvae: Secondary | ICD-10-CM | POA: Diagnosis not present

## 2021-10-24 DIAGNOSIS — R1084 Generalized abdominal pain: Secondary | ICD-10-CM

## 2021-10-24 MED ORDER — GABAPENTIN 600 MG PO TABS: 600.0000 mg | ORAL_TABLET | Freq: Three times a day (TID) | ORAL | 1 refills | Status: DC

## 2021-10-24 MED ORDER — BETAMETHASONE VALERATE 0.1 % EX CREA: TOPICAL_CREAM | Freq: Two times a day (BID) | CUTANEOUS | 1 refills | Status: DC

## 2021-10-24 MED ORDER — DIAZEPAM 10 MG PO TABS: 10.0000 mg | ORAL_TABLET | Freq: Two times a day (BID) | ORAL | 5 refills | Status: DC | PRN

## 2021-10-24 NOTE — Progress Notes (Signed)
MyChart Video Visit    Virtual Visit via Video Note   This visit type was conducted due to national recommendations for restrictions regarding the COVID-19 Pandemic (e.g. social distancing) in an effort to limit this patient's exposure and mitigate transmission in our community. This patient is at least at moderate risk for complications without adequate follow up. This format is felt to be most appropriate for this patient at this time. Physical exam was limited by quality of the video and audio technology used for the visit. CMA was able to get the patient set up on a video visit.  Patient location: Home. Patient and provider in visit Provider location: Office  I discussed the limitations of evaluation and management by telemedicine and the availability of in person appointments. The patient expressed understanding and agreed to proceed.  Visit Date: 10/24/2021  Today's healthcare provider: Wellington Hampshire, MD     Subjective:    Patient ID: Katrina Benitez, female    DOB: 14-Jul-1946, 76 y.o.   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  Has lomotil and zofran on hand but the zofran wasn't holding for the nausea.  Called GI and heard back end of day, sent in phenergan suppositories and helped.  Abd pain better today. Still no good bm.  Didn't take metamucil as was too nauseated yesterday.  A lot of stress-on valium BID-helps stress and abd pain.  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   Just getting over UTI.  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  Has seen Derm, etc.   Has vulvar itching a lot-needs refill on betamethasone cream.  Past Medical History:  Diagnosis Date   Allergy    Anemia    Anxiety    Arthritis    degenerative disc  disorder   Cardiac arrhythmia    Depression    Diverticulitis    Diverticulosis    GERD (gastroesophageal reflux disease)    Heart murmur    HTN (hypertension)    Infertility, female    Neuromuscular disorder (HCC)    idiopathic peripheral  neuropathy   Peripheral neuropathy    Positive PPD    SIADH (syndrome of inappropriate ADH production) ([place])     Past Surgical History:  Procedure Laterality Date   BREAST BIOPSY Left    BREAST EXCISIONAL BIOPSY Left    benign   CERVICAL BIOPSY  W/ LOOP ELECTRODE EXCISION     CERVICAL POLYPECTOMY  02/2016   COLONOSCOPY     COLPOSCOPY     DILATION AND CURETTAGE OF UTERUS     HYSTEROSCOPY     LUMBAR DISC SURGERY     LUMBAR LAMINECTOMY     TONSILLECTOMY     UPPER GASTROINTESTINAL ENDOSCOPY      Outpatient Medications Prior to Visit  Medication Sig Dispense Refill   betamethasone valerate (VALISONE) 0.1 % cream Apply topically 2 (two) times daily. 45 g 1   Cholecalciferol 50 MCG (2000 UT) CAPS Vitamin D3 50 mcg (2,000 unit) capsule     clobetasol (TEMOVATE) 0.05 % external solution APPLY TO AFFECTED AREA UPTO TWICE DAILY AS NEEDED (NOT TO FACE, GROIN, OR UNDERARMS)     diazepam (VALIUM) 10 MG tablet TAKE ONE TABLET EVERY 12 HOURS AS NEEDEDFOR  ANXIETY 60 tablet 5   diphenoxylate-atropine (LOMOTIL) 2.5-0.025 MG tablet Take 1 tablet by mouth 4 (four) times daily as needed for diarrhea or loose stools. 120 tablet 1   gabapentin (NEURONTIN) 600 MG tablet TAKE ONE TABLET THREE TIMES DAILY 270 tablet 1   hydrOXYzine (VISTARIL) 25 MG capsule Take 1 capsule (25 mg total) by mouth every 6 (six) hours as needed. (Patient taking differently: Take 25 mg by mouth every 6 (six) hours as needed. 2 capsules every six hours) 360 capsule 0   omeprazole (PRILOSEC) 40 MG capsule TAKE ONE CAPSULE EACH DAY 90 capsule 1   ondansetron (ZOFRAN) 8 MG tablet Take by mouth.     ondansetron (ZOFRAN-ODT) 8 MG disintegrating tablet TAKE ONE TABLET EVERY EIGHT HOURS AS  NEEDED FOR NAUSEA AND VOMITING 20 tablet 1   phenazopyridine (PYRIDIUM) 200 MG tablet Take 200 mg by mouth every 8 (eight) hours.     Probiotic Product (ALIGN PO) Take by mouth daily.     promethazine (PHENERGAN) 25 MG suppository Place 1 suppository (25 mg total) rectally every 6 (six) hours as needed for nausea or vomiting. 20 each 0   sodium chloride 1 g tablet Take 1 g by mouth daily.      triamcinolone cream (KENALOG) 0.1 % Apply topically 2 (two) times daily.     valACYclovir (VALTREX) 1000 MG tablet Take by mouth.     zolpidem (AMBIEN) 10 MG tablet TAKE ONE TABLET AT BEDTIME AS NEEDED 90 tablet 0   buPROPion (WELLBUTRIN XL) 150 MG 24 hr tablet TAKE ONE TABLET EACH DAY (Patient not taking: Reported on 10/24/2021) 90 tablet 1   cholecalciferol (VITAMIN D3) 10 MCG (400 UNIT) TABS tablet Take 400 Units by mouth.     triamcinolone cream (KENALOG) 0.1 %      No facility-administered medications prior to visit.    Allergies  Allergen Reactions   Hydromorphone Hives   Iodixanol Swelling and Other (See Comments)    Throat swelling Throat swelling Throat swelling Throat swelling Other reaction(s): Other, Other (See Comments) Throat swelling Throat swelling Throat swelling Throat swelling Throat swelling   Metronidazole Hives   Fentanyl Itching   Ciprofloxacin Hives   Dilaudid [Hydromorphone Hcl] Hives        Objective:     Physical Exam Vitals and nursing note reviewed.  Constitutional:      General:  is not in acute distress.    Appearance: Normal appearance.  HENT:     Head: Normocephalic.  Pulmonary:     Effort: No respiratory distress.  Skin:    General: Skin is dry.     Coloration: Skin is not pale.  Neurological:     Mental Status: Pt is alert and oriented to person, place, and time.  Psychiatric:        Mood and Affect: Mood normal.   LMP  (LMP Unknown)   Wt Readings from Last 3 Encounters:  01/09/21 190 lb 0.6 oz (86.2 kg)  11/16/20 190 lb 0.6 oz (86.2  kg)  03/21/20 190 lb (86.2 kg)       Assessment & Plan:   Problem List Items Addressed This Visit       Musculoskeletal and Integument   Vulvar pruritus   Pruritus     Other   Anxiety   Other Visit Diagnoses     Nausea    -  Primary   Generalized abdominal pain          Abd pain-chronic.  Improved  today.  Monitor.  As chronic constipation(sees GI), maybe try daily miralax as well as sometimes hard to take metamucil Nausea-better w/phenergan-per GI Anxiety-doing well on valium bid.  Can't do SSRI as worsen hyponatremia(discussed labs from 1/31).  Reviewed PDMP Vulvar itching-refill betamethasone cream.  Keep appt on 2/13 for TOC  No orders of the defined types were placed in this encounter.   I discussed the assessment and treatment plan with the patient. The patient was provided an opportunity to ask questions and all were answered. The patient agreed with the plan and demonstrated an understanding of the instructions.   The patient was advised to call back or seek an in-person evaluation if the symptoms worsen or if the condition fails to improve as anticipated.  I provided 25 minutes of face-to-face time during this encounter. But a lot of time trying to get the video working on both ends   Wellington Hampshire, MD [place] [phone] (phone) [phone] (fax)  [place]

## 2021-10-24 NOTE — Telephone Encounter (Signed)
Left message for pt to call back  °

## 2021-10-24 NOTE — Telephone Encounter (Signed)
Routine office visit with me for full assessment would be best

## 2021-10-24 NOTE — Telephone Encounter (Signed)
Pt returned call. Let pt know that Dr. Henrene Pastor said a routine office visit for full assessment would be best. Scheduled pt  with Dr. Henrene Pastor on Wednesday 11/06/21 at 3:40 pm. Pt verbalized understanding and reported that phenergan helped her nausea.

## 2021-10-24 NOTE — Patient Instructions (Signed)
It was very nice to see you today!  Glad you are feeling some better.  Try a capful of miralax daily to see if helps bowels better. I look forward to seeing you on the 13th   PLEASE NOTE:  If you had any lab tests please let us know if you have not heard back within a few days. You may see your results on MyChart before we have a chance to review them but we will give you a call once they are reviewed by [place]. If we ordered any referrals today, please let us know if you have not heard from their office within the next week.   Please try these tips to maintain a healthy lifestyle:  Eat most of your calories during the day when you are active. Eliminate processed foods including packaged sweets (pies, cakes, cookies), reduce intake of potatoes, white bread, white pasta, and white rice. Look for whole grain options, oat flour or almond flour.  Each meal should contain half fruits/vegetables, one quarter protein, and one quarter carbs (no bigger than a computer mouse).  Cut down on sweet beverages. This includes juice, soda, and sweet tea. Also watch fruit intake, though this is a healthier sweet option, it still contains natural sugar! Limit to 3 servings daily.  Drink at least 1 glass of water with each meal and aim for at least 8 glasses per day  Exercise at least 150 minutes every week.

## 2021-10-29 ENCOUNTER — Other Ambulatory Visit: Payer: Self-pay

## 2021-10-29 ENCOUNTER — Ambulatory Visit (INDEPENDENT_AMBULATORY_CARE_PROVIDER_SITE_OTHER): Payer: Medicare Other

## 2021-10-29 ENCOUNTER — Telehealth (HOSPITAL_BASED_OUTPATIENT_CLINIC_OR_DEPARTMENT_OTHER): Payer: Self-pay

## 2021-10-29 DIAGNOSIS — R350 Frequency of micturition: Secondary | ICD-10-CM

## 2021-10-29 LAB — POCT URINALYSIS DIPSTICK
Bilirubin, UA: NEGATIVE
Blood, UA: NEGATIVE
Glucose, UA: NEGATIVE
Ketones, UA: NEGATIVE
Nitrite, UA: NEGATIVE
Protein, UA: NEGATIVE
Spec Grav, UA: 1.015 (ref 1.010–1.025)
Urobilinogen, UA: 0.2 E.U./dL
pH, UA: 6.5 (ref 5.0–8.0)

## 2021-10-29 NOTE — Telephone Encounter (Signed)
Patient came in today to give a urine sample for urinalysis and urine culture. Patient will wait until culture results come back. tbw

## 2021-10-29 NOTE — Telephone Encounter (Signed)
Patient called 10/28/2021 @3 :30 stating she spoke with Dr. [name] Heck last week about an ongoing UTI that she has been treated for by another physician. Patient states she was told by Dr.Miller that she would need to come by the office to give a urine sample. She states she was unable to come in at the time due to GI issues that she was experiencing. She was told to just give [place] a call when she would be able to come in.   Tried calling patient today at 8:31 to see what time she can come in to give a urine. I LMOM for patient to give our office a call back so that we can accommodate her with an appointment. tbw

## 2021-10-29 NOTE — Progress Notes (Signed)
Patient came in today to give a repeat urine sample to be sent off for culture. tbw

## 2021-10-30 DIAGNOSIS — L308 Other specified dermatitis: Secondary | ICD-10-CM | POA: Diagnosis not present

## 2021-10-30 DIAGNOSIS — L298 Other pruritus: Secondary | ICD-10-CM | POA: Diagnosis not present

## 2021-10-30 DIAGNOSIS — L659 Nonscarring hair loss, unspecified: Secondary | ICD-10-CM | POA: Diagnosis not present

## 2021-10-30 DIAGNOSIS — Z79899 Other long term (current) drug therapy: Secondary | ICD-10-CM | POA: Diagnosis not present

## 2021-10-30 LAB — URINE CULTURE

## 2021-11-01 ENCOUNTER — Other Ambulatory Visit: Payer: Self-pay

## 2021-11-01 MED ORDER — ONDANSETRON 8 MG PO TBDP: 8.0000 mg | ORAL_TABLET | Freq: Three times a day (TID) | ORAL | 0 refills | Status: DC | PRN

## 2021-11-01 NOTE — Telephone Encounter (Signed)
Inbound call from patient, stated that she is having another episode and is wondering if there is anyway she can be seen sooner. Please advise.

## 2021-11-01 NOTE — Telephone Encounter (Signed)
Spoke with pt. Pt states she is having another episode of nausea and vomiting. This episode started this morning at 4 am. Pt also reports she has mid epigastric pain. Pt states she took her last zofran and was able to go back to sleep. Pt states she has vomited once so far today. Pt also reports that she has drank some coke but really can't drink much because of the nausea. Let pt know that she would need to go to ER if she can't keep anything down. Pt states she will not go to the ER. Let pt know that Dr. Blanch Media next available appt is 2/15. Pt requested a refill on zofran to get her through until her appt on 11/06/21.   As DOD please advise.

## 2021-11-01 NOTE — Telephone Encounter (Signed)
Spoke with pt. Let pt know Dr. Tarri Glenn said we can send in enough zofran to get her through until her appt but if she is unable to keep fluids down she would need to go to ER. Pt verbalized understanding. Refill for zofran sent to pt's pharmacy.

## 2021-11-01 NOTE — Telephone Encounter (Signed)
Thornton Park, MD    Last seen in the office in 2020. Note at that time mentions treatment of nausea with Zofran. She may have enough Zofran to get her through until her appointment with Dr. Henrene Pastor next week. However, she must go to the ED if she is not able to keep down liquids.   KLB

## 2021-11-04 ENCOUNTER — Ambulatory Visit (INDEPENDENT_AMBULATORY_CARE_PROVIDER_SITE_OTHER): Payer: Medicare Other | Admitting: Family Medicine

## 2021-11-04 ENCOUNTER — Other Ambulatory Visit: Payer: Self-pay

## 2021-11-04 ENCOUNTER — Other Ambulatory Visit: Payer: Self-pay | Admitting: *Deleted

## 2021-11-04 ENCOUNTER — Encounter: Payer: Self-pay | Admitting: Family Medicine

## 2021-11-04 VITALS — BP 126/78 | HR 78 | Temp 97.9°F | Ht 67.0 in

## 2021-11-04 DIAGNOSIS — K219 Gastro-esophageal reflux disease without esophagitis: Secondary | ICD-10-CM

## 2021-11-04 DIAGNOSIS — Z6829 Body mass index (BMI) 29.0-29.9, adult: Secondary | ICD-10-CM

## 2021-11-04 DIAGNOSIS — E222 Syndrome of inappropriate secretion of antidiuretic hormone: Secondary | ICD-10-CM | POA: Diagnosis not present

## 2021-11-04 DIAGNOSIS — B0229 Other postherpetic nervous system involvement: Secondary | ICD-10-CM

## 2021-11-04 DIAGNOSIS — F339 Major depressive disorder, recurrent, unspecified: Secondary | ICD-10-CM | POA: Diagnosis not present

## 2021-11-04 DIAGNOSIS — L299 Pruritus, unspecified: Secondary | ICD-10-CM | POA: Diagnosis not present

## 2021-11-04 DIAGNOSIS — F5101 Primary insomnia: Secondary | ICD-10-CM | POA: Diagnosis not present

## 2021-11-04 DIAGNOSIS — E663 Overweight: Secondary | ICD-10-CM

## 2021-11-04 NOTE — Progress Notes (Signed)
Subjective:     Patient ID: Katrina Benitez, female    DOB: [birth year], 76 y.o.   MRN: [id number]  Chief Complaint  Patient presents with   Transfer of Care    HPI Toc Moved from [place] >8 yrs ago. Husb was ETOH so dealt w/that.  Moved 3 x since here and in process of selling this house.  A lot of changes and things to deal w/.  Depression/anx-Meltdowns at times.  Placed on Valium BID-takes intermitt. Takes at [facility] for sleep and occ daytime.  Wellbutrin not work.  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 No SI  2.  Adult Prurtitis of unk etiol-seeing Derm. On mult meds 3.  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 Stable.  No dizziness 4.  Post herpetic neuralgia-chest. Stable on gabapentin 5.  Pt had fall May 2021-still soft tissue damage in foot/leg 6.  GI issues still-intermitt pain.  Still w/nausea.  sees GI on 2/15.  Nausea freq-promethazine working well.  Zofran not work 7.  Overweight-has gained a lot over past several yrs-read article that obesity meds are "underutilized".  Requesting meds.  Wt-wants meds as good diet.  Not so much exercise, but "busy"  There are no preventive care reminders to display for this patient.  Past Medical History:  Diagnosis Date   Allergy    Anemia    Anxiety    Arthritis    degenerative disc disorder   Cardiac arrhythmia    Depression    Diverticulitis    Diverticulosis    GERD (gastroesophageal reflux disease)    Heart murmur    HTN (hypertension)    Infertility, female    Neuromuscular disorder (HCC)    idiopathic peripheral  neuropathy   Peripheral neuropathy    Positive PPD    SIADH (syndrome of inappropriate ADH production) ([place])     Past Surgical History:  Procedure Laterality Date   BREAST BIOPSY Left    BREAST EXCISIONAL BIOPSY Left    benign   CERVICAL BIOPSY  W/ LOOP ELECTRODE EXCISION     CERVICAL POLYPECTOMY  02/2016   COLONOSCOPY     COLPOSCOPY     DILATION AND CURETTAGE OF UTERUS     HYSTEROSCOPY     LUMBAR DISC SURGERY     LUMBAR  LAMINECTOMY     TONSILLECTOMY     UPPER GASTROINTESTINAL ENDOSCOPY      Outpatient Medications Prior to Visit  Medication Sig Dispense Refill   betamethasone dipropionate 0.05 % cream SMARTSIG:1 sparingly Topical Daily     Cholecalciferol 50 MCG (2000 UT) CAPS Vitamin D3 50 mcg (2,000 unit) capsule     clobetasol (TEMOVATE) 0.05 % external solution APPLY TO AFFECTED AREA UPTO TWICE DAILY AS NEEDED (NOT TO FACE, GROIN, OR UNDERARMS)     diazepam (VALIUM) 10 MG tablet Take 1 tablet (10 mg total) by mouth every 12 (twelve) hours as needed for anxiety. 60 tablet 5   diphenoxylate-atropine (LOMOTIL) 2.5-0.025 MG tablet Take 1 tablet by mouth 4 (four) times daily as needed for diarrhea or loose stools. 120 tablet 1   gabapentin (NEURONTIN) 600 MG tablet Take 1 tablet (600 mg total) by mouth 3 (three) times daily. 270 tablet 1   hydrOXYzine (ATARAX) 25 MG tablet Take 25 mg by mouth 4 (four) times daily. 2 tablets qid     omeprazole (PRILOSEC) 40 MG capsule TAKE ONE CAPSULE EACH DAY 90 capsule 1   ondansetron (ZOFRAN-ODT) 8 MG disintegrating tablet Take 1 tablet (8 mg total) by mouth  every 8 (eight) hours as needed for nausea or vomiting. 20 tablet 0   Probiotic Product (ALIGN PO) Take by mouth daily.     promethazine (PHENERGAN) 25 MG suppository Place 1 suppository (25 mg total) rectally every 6 (six) hours as needed for nausea or vomiting. 20 each 0   sodium chloride 1 g tablet Take 1 g by mouth daily.      triamcinolone cream (KENALOG) 0.1 % Apply topically 2 (two) times daily.     valACYclovir (VALTREX) 1000 MG tablet Take by mouth.     zolpidem (AMBIEN) 10 MG tablet TAKE ONE TABLET AT BEDTIME AS NEEDED 90 tablet 0   betamethasone valerate (VALISONE) 0.1 % cream Apply topically 2 (two) times daily. 45 g 1   No facility-administered medications prior to visit.    Allergies  Allergen Reactions   Hydromorphone Hives   Iodixanol Swelling and Other (See Comments)    Throat swelling Throat  swelling Throat swelling Throat swelling Other reaction(s): Other, Other (See Comments) Throat swelling Throat swelling Throat swelling Throat swelling Throat swelling   Metronidazole Hives   Fentanyl Itching   Ciprofloxacin Hives   Dilaudid [Hydromorphone Hcl] Hives   OXB:[id number] except as noted in HPI Just got over UTI H/o HTN-but doing well off meds      Objective:     BP 126/78    Pulse 78    Temp 97.9 F (36.6 C) (Temporal)    Ht 5\' 7"  (1.702 m)    LMP  (LMP Unknown)    SpO2 97%    BMI 29.76 kg/m  Wt Readings from Last 3 Encounters:  01/09/21 190 lb 0.6 oz (86.2 kg)  11/16/20 190 lb 0.6 oz (86.2 kg)  03/21/20 190 lb (86.2 kg)        Gen: WDWN NAD wf HEENT: NCAT, conjunctiva not injected, sclera nonicteric NECK:  supple, no thyromegaly, no nodes, no carotid bruits CARDIAC: RRR, S1S2+, no murmur. DP 2+B LUNGS: CTAB. No wheezes EXT:  no edema, spider veins MSK: no gross abnormalities.  NEURO: A&O x3.  CN II-XII intact.  PSYCH: normal mood. Good eye contact  Assessment & Plan:   Problem List Items Addressed This Visit       Digestive   GERD (gastroesophageal reflux disease)     Endocrine   SIADH (syndrome of inappropriate ADH production) (HCC)     Nervous and Auditory   Post herpetic neuralgia     Musculoskeletal and Integument   Pruritus - Primary     Other   Depression, recurrent (HCC)   Relevant Medications   hydrOXYzine (ATARAX) 25 MG tablet   Primary insomnia   Other Visit Diagnoses     Overweight with body mass index (BMI) of 29 to 29.9 in adult         Pruritis-stable on meds.  Sees derm but they are retiring SIADH-labs w/min dec Na in January.  Avoid SSRI.  stable Post herpetic neuralgia-stable on gabapentin-cont Abd pain/gerd-not ideal on meds.  Seeing GI in 2 days Chronic insomnia-stable on valium. Cont Depression-can't take SSRI, wellbutrin not help-takes valium occ during day. Cont Overweight-work on TLC.  Pt  will research meds. F/u 6 mo  No orders of the defined types were placed in this encounter.   Wellington Hampshire, MD

## 2021-11-04 NOTE — Patient Instructions (Signed)

## 2021-11-05 DIAGNOSIS — Z20822 Contact with and (suspected) exposure to covid-19: Secondary | ICD-10-CM | POA: Diagnosis not present

## 2021-11-06 ENCOUNTER — Ambulatory Visit: Payer: Medicare Other | Admitting: Internal Medicine

## 2021-11-06 ENCOUNTER — Other Ambulatory Visit: Payer: Self-pay | Admitting: Internal Medicine

## 2021-11-25 ENCOUNTER — Encounter: Payer: Self-pay | Admitting: Internal Medicine

## 2021-11-25 ENCOUNTER — Other Ambulatory Visit (INDEPENDENT_AMBULATORY_CARE_PROVIDER_SITE_OTHER): Payer: Medicare Other

## 2021-11-25 ENCOUNTER — Ambulatory Visit (INDEPENDENT_AMBULATORY_CARE_PROVIDER_SITE_OTHER): Payer: Medicare Other | Admitting: Internal Medicine

## 2021-11-25 VITALS — BP 112/70 | HR 92 | Ht 64.5 in

## 2021-11-25 DIAGNOSIS — E871 Hypo-osmolality and hyponatremia: Secondary | ICD-10-CM

## 2021-11-25 DIAGNOSIS — R112 Nausea with vomiting, unspecified: Secondary | ICD-10-CM | POA: Diagnosis not present

## 2021-11-25 DIAGNOSIS — K219 Gastro-esophageal reflux disease without esophagitis: Secondary | ICD-10-CM | POA: Diagnosis not present

## 2021-11-25 DIAGNOSIS — R109 Unspecified abdominal pain: Secondary | ICD-10-CM | POA: Diagnosis not present

## 2021-11-25 LAB — BASIC METABOLIC PANEL
BUN: 19 mg/dL (ref 6–23)
CO2: 27 mEq/L (ref 19–32)
Calcium: 9.1 mg/dL (ref 8.4–10.5)
Chloride: 91 mEq/L — ABNORMAL LOW (ref 96–112)
Creatinine, Ser: 0.99 mg/dL (ref 0.40–1.20)
GFR: 55.75 mL/min — ABNORMAL LOW (ref >60.00)
Glucose, Bld: 93 mg/dL (ref 70–99)
Potassium: 4.1 mEq/L (ref 3.5–5.1)
Sodium: 125 mEq/L — ABNORMAL LOW (ref 135–145)

## 2021-11-25 MED ORDER — ONDANSETRON 8 MG PO TBDP: 8.0000 mg | ORAL_TABLET | Freq: Three times a day (TID) | ORAL | 1 refills | Status: DC | PRN

## 2021-11-25 NOTE — Patient Instructions (Addendum)
If you are age 76 or older, your body mass index should be between 23-30. Your Body mass index is 32.12 kg/m?Marland Kitchen If this is out of the aforementioned range listed, please consider follow up with your Primary Care Provider. ? ?If you are age 39 or younger, your body mass index should be between 19-25. Your Body mass index is 32.12 kg/m?Marland Kitchen If this is out of the aformentioned range listed, please consider follow up with your Primary Care Provider.  ? ?________________________________________________________ ? ?The [facility] providers would like to encourage you to use [facility] to communicate with providers for non-urgent requests or questions.  Due to long hold times on the telephone, sending your provider a message by [facility] may be a faster and more efficient way to get a response.  Please allow 48 business hours for a response.  Please remember that this is for non-urgent requests.  ?_______________________________________________________ ? ?You will be contacted by [place] in the next 2 days to arrange a abdominal ultrasound and gastric emptying scan.  The number on your caller ID will be [phone], please answer when they call.  If you have not heard from them in 2 days please call [phone] to schedule.   ? ?We have sent the following medications to your pharmacy for you to pick up at your convenience:  Zofran ? ? ?

## 2021-11-25 NOTE — Progress Notes (Signed)
HISTORY OF PRESENT ILLNESS: ? ?Katrina Benitez is a 76 y.o. female, retired Surveyor, quantity, who presents today regarding problems with nausea and vomiting.  She has longstanding GI complaints.  She was last seen in this office August 30, 2019 regarding GERD and chronic nausea.  See that dictation.  She last underwent colonoscopy and upper endoscopy September 2020.  Upper endoscopy revealed a large caliber esophageal ring and nonspecific gastritis.  Testing for Helicobacter pylori was negative.  She continues on omeprazole.  Colonoscopy revealed diminutive adenomas and diverticulosis.  Normal terminal ileum.  She tells me today that about 4 to 5 weeks ago she developed worsening nausea.  Often after meals.  Describes a brick like sensation.  On 1 occasion fairly severe abdominal pain in the anterior abdomen without radiation.  She has vomited 4-5 times over the past month or so.  Nothing within the past 2 weeks.  Tomato soup on 1 occasion.  Generally not undigested food.  No weight loss.  She denies any significant breakthrough reflux symptoms, though she did have some this afternoon.  Her more recent problem seemed refractory to Zofran.  She was prescribed Phenergan suppositories.  She is now back at baseline ? ?REVIEW OF SYSTEMS: ? ?All non-GI ROS negative unless otherwise stated in the HPI except for anxiety, arthritis, back pain, itching, ankle swelling ? ?Past Medical History:  ?Diagnosis Date  ? Allergy   ? Anemia   ? Anxiety   ? Arthritis   ? degenerative disc disorder  ? Cardiac arrhythmia   ? Depression   ? Diverticulitis   ? Diverticulosis   ? GERD (gastroesophageal reflux disease)   ? Heart murmur   ? HTN (hypertension)   ? Infertility, female   ? Neuromuscular disorder ([place])   ? idiopathic peripheral  neuropathy  ? Peripheral neuropathy   ? Positive PPD   ? SIADH (syndrome of inappropriate ADH production) ([place])   ? ? ?Past Surgical History:  ?Procedure Laterality Date  ? BREAST BIOPSY Left   ?  BREAST EXCISIONAL BIOPSY Left   ? benign  ? CERVICAL BIOPSY  W/ LOOP ELECTRODE EXCISION    ? CERVICAL POLYPECTOMY  02/2016  ? COLONOSCOPY    ? COLPOSCOPY    ? DILATION AND CURETTAGE OF UTERUS    ? HYSTEROSCOPY    ? LUMBAR DISC SURGERY    ? LUMBAR LAMINECTOMY    ? TONSILLECTOMY    ? UPPER GASTROINTESTINAL ENDOSCOPY    ? ? ?Social History ?[name]  reports that she has never smoked. She has never used smokeless tobacco. She reports current alcohol use of about 7.0 standard drinks per week. She reports that she does not use drugs. ? ?family history includes Breast cancer in her mother; Depression in her brother and father; Early death in her father; Glaucoma in her mother; Heart attack in her father; Heart disease in her father; Hypertension in her brother; Miscarriages / [place] in her mother; Prostate cancer in her paternal grandfather; Tuberculosis in her paternal grandmother. ? ?Allergies  ?Allergen Reactions  ? Hydromorphone Hives  ? Iodixanol Swelling and Other (See Comments)  ?  Other reaction(s): Other, Other (See Comments) ?Throat swelling  ? Metronidazole Hives  ? Fentanyl Itching  ? Ciprofloxacin Hives  ? Dilaudid [Hydromorphone Hcl] Hives  ? ? ?  ? ?PHYSICAL EXAMINATION: ?Vital signs: BP 112/70 (BP Location: Left Arm, Patient Position: Sitting, Cuff Size: Normal)   Pulse 92   Ht 5' 4.5" (1.638 m) Comment: height  measured without shoes  LMP  (LMP Unknown)   BMI 32.12 kg/m?   ?Constitutional: generally well-appearing, no acute distress ?Psychiatric: alert and oriented x3, cooperative ?Eyes: extraocular movements intact, anicteric, conjunctiva pink ?Mouth: oral pharynx moist, no lesions ?Neck: supple no lymphadenopathy ?Cardiovascular: heart regular rate and rhythm, no murmur ?Lungs: clear to auscultation bilaterally ?Abdomen: soft, nontender, nondistended, no obvious ascites, no peritoneal signs, normal bowel sounds, no organomegaly.  No succussion splash ?Rectal: Omitted ?Extremities: no  clubbing or cyanosis.  Trace lower extremity edema bilaterally ?Skin: no lesions on visible extremities ?Neuro: No focal deficits.  Cranial nerves intact ? ?ASSESSMENT: ? ?1.  Recent problems with nausea and infrequent vomiting as described. ?2.  GERD.  Seemingly controlled on omeprazole 40 mg daily. ?3.  Upper endoscopy September 2020 as described ?4.  History of diminutive colonic adenomas.  Last colonoscopy September 2020 ?5.  General medical problems ? ? ?PLAN: ? ?1.  Solid-phase gastric emptying scan.  Rule out gastroparesis ?2.  Abdominal ultrasound.  Rule out gallstones ?3.  Reflux precautions ?4.  Continue PPI ?5.  Refill Zofran per patient request ?6.  Routine GI follow-up to be determined or as needed. ?A total time of 30 minutes was spent preparing to see the patient, reviewing test, obtaining history, performing medically appropriate physical examination, counseling the patient and educating the patient regarding the above listed issues, ordering medications, ordering advanced radiology studies, and documenting clinical information in the health record ? ? ? ?  ?

## 2021-11-26 ENCOUNTER — Telehealth: Payer: Self-pay | Admitting: Family Medicine

## 2021-11-26 ENCOUNTER — Encounter: Payer: Self-pay | Admitting: Internal Medicine

## 2021-11-26 ENCOUNTER — Other Ambulatory Visit: Payer: Self-pay | Admitting: *Deleted

## 2021-11-26 DIAGNOSIS — E871 Hypo-osmolality and hyponatremia: Secondary | ICD-10-CM

## 2021-11-26 NOTE — Progress Notes (Signed)
bmp 

## 2021-11-26 NOTE — Telephone Encounter (Signed)
Katrina Benitez stated her sodium level is at 125 this AM.- Stated it was 120 yesterday. No further symptoms associated with low sodium. Patient would like to know if she should be taking different meds?  ?

## 2021-11-26 NOTE — Telephone Encounter (Signed)
Spoke to patient, gave recommendations per Dr. Cherlynn Kaiser. Patient verbalized understanding.  ?

## 2021-11-29 ENCOUNTER — Other Ambulatory Visit (INDEPENDENT_AMBULATORY_CARE_PROVIDER_SITE_OTHER): Payer: Medicare Other

## 2021-11-29 DIAGNOSIS — E871 Hypo-osmolality and hyponatremia: Secondary | ICD-10-CM

## 2021-11-29 LAB — BASIC METABOLIC PANEL
BUN: 16 mg/dL (ref 6–23)
CO2: 29 mEq/L (ref 19–32)
Calcium: 9 mg/dL (ref 8.4–10.5)
Chloride: 98 mEq/L (ref 96–112)
Creatinine, Ser: 0.99 mg/dL (ref 0.40–1.20)
GFR: 55.74 mL/min — ABNORMAL LOW (ref >60.00)
Glucose, Bld: 85 mg/dL (ref 70–99)
Potassium: 4.6 mEq/L (ref 3.5–5.1)
Sodium: 132 mEq/L — ABNORMAL LOW (ref 135–145)

## 2021-12-02 DIAGNOSIS — H02831 Dermatochalasis of right upper eyelid: Secondary | ICD-10-CM | POA: Diagnosis not present

## 2021-12-02 DIAGNOSIS — H2513 Age-related nuclear cataract, bilateral: Secondary | ICD-10-CM | POA: Diagnosis not present

## 2021-12-02 DIAGNOSIS — H02834 Dermatochalasis of left upper eyelid: Secondary | ICD-10-CM | POA: Diagnosis not present

## 2021-12-02 DIAGNOSIS — H43813 Vitreous degeneration, bilateral: Secondary | ICD-10-CM | POA: Diagnosis not present

## 2021-12-05 ENCOUNTER — Ambulatory Visit (HOSPITAL_COMMUNITY): Payer: Medicare Other

## 2021-12-06 ENCOUNTER — Encounter: Payer: Medicare Other | Admitting: Nurse Practitioner

## 2021-12-11 ENCOUNTER — Telehealth: Payer: Self-pay | Admitting: Internal Medicine

## 2021-12-11 ENCOUNTER — Ambulatory Visit (HOSPITAL_COMMUNITY): Payer: Medicare Other

## 2021-12-11 DIAGNOSIS — R112 Nausea with vomiting, unspecified: Secondary | ICD-10-CM

## 2021-12-11 DIAGNOSIS — R109 Unspecified abdominal pain: Secondary | ICD-10-CM

## 2021-12-11 NOTE — Telephone Encounter (Signed)
Patient called requesting you send the orders for her US Abdomen Complete and the NM Gastric Emptying study to [facility].  Between her schedule and the schedule at [facility], she said it was too difficult to get scheduled.  Please call patient and advise.  Thank you. ?

## 2021-12-11 NOTE — Telephone Encounter (Signed)
Spoke with patient and told her we could schedule her abdominal ultrasound at [facility] but they do not do Gastric Emptying Studies so that would have to be done at the hospital.  Scheduled ultrasound for this Friday and sent information through mychart.  Sent new email to scheduling asking them to reach out and schedule the GES at [facility]. ?

## 2021-12-12 ENCOUNTER — Telehealth: Payer: Self-pay

## 2021-12-12 NOTE — Telephone Encounter (Signed)
Left 2nd message regarding [facility] abdominal ultrasound on 3/24 ?

## 2021-12-12 NOTE — Telephone Encounter (Signed)
Lm on vm to make sure patient got my mychart message regarding her abdominal ultrasound at [facility] on Friday 3/24 ?

## 2021-12-13 ENCOUNTER — Other Ambulatory Visit: Payer: Medicare Other

## 2021-12-16 ENCOUNTER — Telehealth: Payer: Self-pay | Admitting: Cardiovascular Disease

## 2021-12-16 DIAGNOSIS — I480 Paroxysmal atrial fibrillation: Secondary | ICD-10-CM

## 2021-12-16 NOTE — Telephone Encounter (Signed)
Left message for patient to call back  

## 2021-12-16 NOTE — Telephone Encounter (Signed)
Pt sent this via Mychart to our Scheduling pool:   ? ? ? ?Unknown period of time. ?No symptoms  ?YES -- hx AFIB (hospitalized 11/2018) ?BP  -- 128/80 ?HR. -- 68 bpm ? ? ? ? ?How long have you had palpitations/irregular HR/ Afib? Are you having the symptoms now?  ? ?Are you currently experiencing lightheadedness, SOB or CP?  ? ?Do you have a history of afib (atrial fibrillation) or irregular heart rhythm?  ? ?Have you checked your BP or HR? (document readings if available):  ? ?Are you experiencing any other symptoms?   ? ? ? ?Appointment Request From: Katrina Benitez ? ?With Provider: Sherren Mocha, MD [place] ? ?Preferred Date Range: Any ? ?Preferred Times: Any Time ? ?Reason for visit: Office Visit ? ?Comments: ?AFIB ?    Your return message disappeared (?) before I could respond.  Trying to remember all questions: ?    YES -- past episodes of AFIB ?    NO sx ?     PALPITATIONS -- occasionally aware of, but have been for years ?Katrina Benitez ?DOB:  09/08/1946 ?

## 2021-12-16 NOTE — Telephone Encounter (Signed)
Spoke with the patient who states that her watch showed her that last week she had a 5% AFIB burden. She states that it typically is at 2% or less so she wanted to make sure nothing needed to be done. She is completely asymptomatic. BP and HR have been controlled. Advised patient to continue to monitor.  ?She is not currently on any anticoagulation as it was discontinued in 2020 after heart monitor showed to recurrence of AFIB.  ?

## 2021-12-16 NOTE — Telephone Encounter (Signed)
Thx for letting me know. Ok to monitor for now. Please arrange repeat monitor because of findings of afib on her watch. If she is truly having recurrent afib, she will need to be started back on anticoagulation. 14 day ZIO or 30 day event recorder ok. thanks ?

## 2021-12-17 ENCOUNTER — Other Ambulatory Visit: Payer: Medicare Other

## 2021-12-17 ENCOUNTER — Ambulatory Visit (HOSPITAL_BASED_OUTPATIENT_CLINIC_OR_DEPARTMENT_OTHER): Payer: Medicare Other | Admitting: Family

## 2021-12-17 ENCOUNTER — Encounter: Payer: Self-pay | Admitting: Family Medicine

## 2021-12-17 ENCOUNTER — Inpatient Hospital Stay: Admission: RE | Admit: 2021-12-17 | Payer: Medicare Other | Source: Ambulatory Visit

## 2021-12-17 ENCOUNTER — Other Ambulatory Visit: Payer: Self-pay | Admitting: Family Medicine

## 2021-12-17 MED ORDER — HYDROXYZINE PAMOATE 25 MG PO CAPS: 50.0000 mg | ORAL_CAPSULE | Freq: Four times a day (QID) | ORAL | 0 refills | Status: DC

## 2021-12-17 NOTE — Telephone Encounter (Signed)
Called and spoke to patient regarding Cooper's advice for wearing a heart monitor for 14-30 days to verify A-fib and if so, need to be on anticoagulation. Pt was very upset that she felt her history had not been reviewed and states she is taking Vistaril QID because she has "terrible skin conditions" and is "highly allergic to all adhesives." Pt willing to do a monitor if able to avoid adhesives. States she has work in the past and had several issues and even came in for someone here to assist and states she "wasn't helpful at all." Will route message to Abram Sander to see what pt's options are. ?

## 2021-12-17 NOTE — Telephone Encounter (Signed)
Spoke with Dr Burt Knack again, he recommends that we place a referral to EP to consider a loop recorder. Spoke with patient who is in agreeance with plan and referral placed at this time. ?

## 2021-12-17 NOTE — Telephone Encounter (Signed)
All of our monitors have adhesives. Patient has wore in the past a Zio monitor and Preventice monitor (multiple different sensitive skin options given to patient at that time) and she was not satisfied with either. Please see telephone note from 04/29/21 for more explanation. Unfortunately im not sure what else we can try.  ?

## 2021-12-24 ENCOUNTER — Ambulatory Visit (HOSPITAL_COMMUNITY)
Admission: RE | Admit: 2021-12-24 | Discharge: 2021-12-24 | Disposition: A | Discharge status: Home / Self Care | Payer: Medicare Other | Source: Ambulatory Visit | Attending: Internal Medicine | Admitting: Internal Medicine

## 2021-12-24 DIAGNOSIS — K219 Gastro-esophageal reflux disease without esophagitis: Secondary | ICD-10-CM | POA: Insufficient documentation

## 2021-12-24 DIAGNOSIS — R112 Nausea with vomiting, unspecified: Secondary | ICD-10-CM | POA: Diagnosis not present

## 2021-12-24 DIAGNOSIS — R109 Unspecified abdominal pain: Secondary | ICD-10-CM | POA: Diagnosis not present

## 2021-12-24 MED ORDER — TECHNETIUM TC 99M SULFUR COLLOID
2.0000 | Freq: Once | INTRAVENOUS | Status: AC | PRN
  Administered 2021-12-24: 2 via INTRAVENOUS

## 2021-12-25 ENCOUNTER — Ambulatory Visit
Admission: RE | Admit: 2021-12-25 | Discharge: 2021-12-25 | Disposition: A | Discharge status: Home / Self Care | Payer: Medicare Other | Source: Ambulatory Visit | Attending: Internal Medicine | Admitting: Internal Medicine

## 2021-12-25 DIAGNOSIS — K8020 Calculus of gallbladder without cholecystitis without obstruction: Secondary | ICD-10-CM | POA: Diagnosis not present

## 2021-12-25 DIAGNOSIS — R112 Nausea with vomiting, unspecified: Secondary | ICD-10-CM

## 2021-12-25 DIAGNOSIS — R109 Unspecified abdominal pain: Secondary | ICD-10-CM

## 2021-12-31 NOTE — Telephone Encounter (Signed)
Patient has completed U/S and gastric emptying scan ?

## 2022-01-14 ENCOUNTER — Other Ambulatory Visit: Payer: Self-pay | Admitting: Dermatology

## 2022-01-14 DIAGNOSIS — L639 Alopecia areata, unspecified: Secondary | ICD-10-CM | POA: Diagnosis not present

## 2022-01-14 DIAGNOSIS — L649 Androgenic alopecia, unspecified: Secondary | ICD-10-CM | POA: Diagnosis not present

## 2022-01-14 DIAGNOSIS — L298 Other pruritus: Secondary | ICD-10-CM | POA: Diagnosis not present

## 2022-01-20 LAB — TSH+FREE T4: TSH W/REFLEX TO FT4: 2.73 mIU/L (ref 0.40–4.50)

## 2022-01-20 LAB — TESTOSTERONE, FREE: TESTOSTERONE FREE: 1.8 pg/mL (ref 0.3–5.0)

## 2022-01-20 LAB — FERRITIN: Ferritin: 19 ng/mL (ref 16–288)

## 2022-01-20 LAB — DHEA: DHEA: 64 ng/dL — ABNORMAL LOW

## 2022-01-20 LAB — VITAMIN D 25 HYDROXY (VIT D DEFICIENCY, FRACTURES): Vit D, 25-Hydroxy: 35 ng/mL (ref 30–100)

## 2022-01-27 ENCOUNTER — Ambulatory Visit (INDEPENDENT_AMBULATORY_CARE_PROVIDER_SITE_OTHER): Payer: Medicare Other | Admitting: Internal Medicine

## 2022-01-27 DIAGNOSIS — I480 Paroxysmal atrial fibrillation: Secondary | ICD-10-CM | POA: Diagnosis not present

## 2022-01-27 DIAGNOSIS — L298 Other pruritus: Secondary | ICD-10-CM | POA: Diagnosis not present

## 2022-01-27 DIAGNOSIS — L649 Androgenic alopecia, unspecified: Secondary | ICD-10-CM | POA: Diagnosis not present

## 2022-01-27 NOTE — Patient Instructions (Addendum)
Medication Instructions:  ?Your physician recommends that you continue on your current medications as directed. Please refer to the Current Medication list given to you today. ? ?Labwork: ?None ordered. ? ?Testing/Procedures: ?None ordered. ? ?Follow-Up: ?Your physician wants you to follow-up based on discussion with Dr. Burt Knack. ? ? ?Any Other Special Instructions Will Be Listed Below (If Applicable). ? ?If you need a refill on your cardiac medications before your next appointment, please call your pharmacy.  ? ?Important Information About Sugar ? ? ? ? ? ? ? ?

## 2022-01-27 NOTE — Progress Notes (Signed)
? ? ? ? 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 Burt Knack for evaluation of atrial fib, and syncope. 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 She had an acute lung infection in 2020 and I think Covid. She developed atrial fib which apparently resolved as her infection improved. She notes a h/o syncope and falls in the past. She broke her fibula. She has had her systemic anti-coagulation stopped. She cannot quantify the exact number of times she has fallen but thinks it is more than 5 per year.  ?Allergies  ?Allergen Reactions  ? Hydromorphone Hives  ? Iodixanol Swelling and Other (See Comments)  ?  Other reaction(s): Other, Other (See Comments) ?Throat swelling  ? Metronidazole Hives  ? Fentanyl Itching  ? Ciprofloxacin Hives  ? Dilaudid [Hydromorphone Hcl] Hives  ? ? ? ?Current Outpatient Medications  ?Medication Sig Dispense Refill  ? Alum Hydroxide-Mag Carbonate (GAVISCON EXTRA STRENGTH) 160-105 MG CHEW 12/06/2012 Aluminum Hydrox-Magnesium Carb Oral Chewable 160 mg-105 mg  PO 1.0 TABLET(S), CHEWABLE daily  12/06/2012  active    ? betamethasone dipropionate 0.05 % cream SMARTSIG:1 sparingly Topical Daily    ? Cholecalciferol 50 MCG (2000 UT) CAPS Vitamin D3 50 mcg (2,000 unit) capsule    ? clobetasol (TEMOVATE) 0.05 % external solution APPLY TO AFFECTED AREA UPTO TWICE DAILY AS NEEDED (NOT TO FACE, GROIN, OR UNDERARMS)    ? diazepam (VALIUM) 10 MG tablet Take 1 tablet (10 mg total) by mouth every 12 (twelve) hours as needed for anxiety. 60 tablet 5  ? diphenoxylate-atropine (LOMOTIL) 2.5-0.025 MG tablet Take 1 tablet by mouth 4 (four) times daily as needed for diarrhea or loose stools. 120 tablet 1  ? Emollient (AQUAPHOR EX) Apply 1 application. topically as needed.    ? gabapentin (NEURONTIN) 600 MG tablet Take 1 tablet (600 mg total) by mouth 3 (three) times daily. 270 tablet 1  ? hydrOXYzine (VISTARIL) 25 MG capsule Take 2 capsules (50 mg total) by mouth every 6 (six) hours. 720 capsule 0  ?  ibuprofen (ADVIL) 200 MG tablet 12/06/2012 Ibuprofen Oral  PO 1.0 TABLET(S) Q8H PRN pain  12/06/2012  active    ? omeprazole (PRILOSEC) 40 MG capsule TAKE ONE CAPSULE EACH DAY 90 capsule 1  ? ondansetron (ZOFRAN-ODT) 8 MG disintegrating tablet Take 1 tablet (8 mg total) by mouth every 8 (eight) hours as needed for nausea or vomiting. 20 tablet 1  ? Probiotic Product (ALIGN PO) Take by mouth daily.    ? promethazine (PHENERGAN) 25 MG suppository Place 1 suppository (25 mg total) rectally every 6 (six) hours as needed for nausea or vomiting. 20 each 0  ? sodium chloride 1 g tablet Take 1 g by mouth daily.     ? triamcinolone cream (KENALOG) 0.1 % Apply topically 2 (two) times daily.    ? valACYclovir (VALTREX) 1000 MG tablet Take by mouth.    ? zolpidem (AMBIEN) 10 MG tablet 12/06/2012 Zolpidem Oral  PO 1.0 TABLET(S) QHS PRN sleep  12/06/2012  active    ? ?No current facility-administered medications for this visit.  ? ? ? ?Past Medical History:  ?Diagnosis Date  ? Allergy   ? Anemia   ? Anxiety   ? Arthritis   ? degenerative disc disorder  ? Cardiac arrhythmia   ? Depression   ? Diverticulitis   ? Diverticulosis   ? GERD (gastroesophageal reflux disease)   ? Heart murmur   ? HTN (hypertension)   ? Infertility, female   ?  Neuromuscular disorder ([place])   ? idiopathic peripheral  neuropathy  ? Peripheral neuropathy   ? Positive PPD   ? SIADH (syndrome of inappropriate ADH production) ([place])   ? ? ?ROS: ? ? All systems reviewed and negative except as noted in the HPI. ? ? ?Past Surgical History:  ?Procedure Laterality Date  ? BREAST BIOPSY Left   ? BREAST EXCISIONAL BIOPSY Left   ? benign  ? CERVICAL BIOPSY  W/ LOOP ELECTRODE EXCISION    ? CERVICAL POLYPECTOMY  02/2016  ? COLONOSCOPY    ? COLPOSCOPY    ? DILATION AND CURETTAGE OF UTERUS    ? HYSTEROSCOPY    ? LUMBAR DISC SURGERY    ? LUMBAR LAMINECTOMY    ? TONSILLECTOMY    ? UPPER GASTROINTESTINAL ENDOSCOPY    ? ? ? ?Family History  ?Problem Relation Age of Onset  ?  Breast cancer Mother   ? Glaucoma Mother   ? Miscarriages / [place] Mother   ? Heart attack Father   ? Depression Father   ? Early death Father   ? Heart disease Father   ? Tuberculosis Paternal Grandmother   ? Prostate cancer Paternal Grandfather   ? Depression Brother   ? Hypertension Brother   ? Colon cancer Neg Hx   ? Esophageal cancer Neg Hx   ? Rectal cancer Neg Hx   ? Stomach cancer Neg Hx   ? ? ? ?Social History  ? ?Socioeconomic History  ? Marital status: Married  ?  Spouse name: Jenny Reichmann  ? Number of children: 0  ? Years of education: College  ? Highest education level: Not on file  ?Occupational History  ? Occupation: retired Engineer, drilling  ?Tobacco Use  ? Smoking status: Never  ? Smokeless tobacco: Never  ?Vaping Use  ? Vaping Use: Never used  ?Substance and Sexual Activity  ? Alcohol use: Yes  ?  Alcohol/week: 7.0 standard drinks  ?  Types: 7 Glasses of wine per week  ? Drug use: No  ? Sexual activity: Not Currently  ?  Birth control/protection: Post-menopausal  ?Other Topics Concern  ? Not on file  ?Social History Narrative  ? Lives at home with her husband.  ? Right-handed.  ? 1-2 cups caffeine per day.  ? Retired Stage manager  ? ?Social Determinants of Health  ? ?Financial Resource Strain: Not on file  ?Food Insecurity: Not on file  ?Transportation Needs: Not on file  ?Physical Activity: Not on file  ?Stress: Not on file  ?Social Connections: Not on file  ?Intimate Partner Violence: Not on file  ? ? ? ?BP 136/84   Pulse 75   Ht '5\' 6"'$  (1.676 m)   LMP  (LMP Unknown)   SpO2 95%   BMI 30.67 kg/m?  ? ?Physical Exam: ? ?Well appearing NAD ?HEENT: Unremarkable ?Neck:  No JVD, no thyromegally ?Lymphatics:  No adenopathy ?Back:  No CVA tenderness ?Lungs:  Clear ?HEART:  Regular rate rhythm, no murmurs, no rubs, no clicks ?Abd:  soft, positive bowel sounds, no organomegally, no rebound, no guarding ?Ext:  2 plus pulses, no edema, no cyanosis, no clubbing ?Skin:  No rashes no nodules ?Neuro:  CN II through XII  intact, motor grossly intact ? ?EKG - nsr ? ? ?Assess/Plan:  ?PAF - unclear as to whether her Apple watch is accurate. I suspect that she will require ILR for atrial fib monitoring. She is not a great candidate for systemic anti-coagulation and might be a candidate for Watchman.  ?  Syncope - for this reason as well, it is unclear the etiology and I think ILR is reasonable especially with the history of falls. ?AI - mild by exam and echo. No additional treatment for now. ? ?Carleene Overlie Bettie Swavely,MD ?

## 2022-02-05 ENCOUNTER — Ambulatory Visit: Payer: Medicare Other | Admitting: Internal Medicine

## 2022-02-11 ENCOUNTER — Telehealth: Payer: Self-pay

## 2022-02-11 ENCOUNTER — Other Ambulatory Visit: Payer: Self-pay | Admitting: *Deleted

## 2022-02-11 MED ORDER — HYDROXYZINE PAMOATE 25 MG PO CAPS: 50.0000 mg | ORAL_CAPSULE | Freq: Four times a day (QID) | ORAL | 0 refills | Status: DC

## 2022-02-11 NOTE — Telephone Encounter (Signed)
Patient has called stating that she has ran out of hydroxyzine.  States last script was from Dr. Ubaldo Glassing on 3/28.  States she does not believe pharmacy has script that [place] sent.  Please follow up with patient in regard.

## 2022-02-11 NOTE — Telephone Encounter (Signed)
Rx sent to the pharmacy. Left detailed message informing patient.

## 2022-02-11 NOTE — Telephone Encounter (Signed)
Patient returned call, notified that Rx was sent to the pharmacy. Patient verbalized understanding.

## 2022-02-11 NOTE — Telephone Encounter (Signed)
Please advise 

## 2022-02-21 ENCOUNTER — Ambulatory Visit (INDEPENDENT_AMBULATORY_CARE_PROVIDER_SITE_OTHER): Payer: Medicare Other

## 2022-02-21 ENCOUNTER — Other Ambulatory Visit (HOSPITAL_BASED_OUTPATIENT_CLINIC_OR_DEPARTMENT_OTHER): Payer: Self-pay | Admitting: Obstetrics & Gynecology

## 2022-02-21 DIAGNOSIS — R300 Dysuria: Secondary | ICD-10-CM | POA: Diagnosis not present

## 2022-02-21 LAB — POCT URINALYSIS DIPSTICK
Bilirubin, UA: NEGATIVE
Blood, UA: NEGATIVE
Glucose, UA: NEGATIVE
Ketones, UA: NEGATIVE
Nitrite, UA: NEGATIVE
Protein, UA: NEGATIVE
Spec Grav, UA: 1.015 (ref 1.010–1.025)
Urobilinogen, UA: 0.2 E.U./dL
pH, UA: 6 (ref 5.0–8.0)

## 2022-02-21 MED ORDER — NITROFURANTOIN MONOHYD MACRO 100 MG PO CAPS: 100.0000 mg | ORAL_CAPSULE | Freq: Two times a day (BID) | ORAL | 0 refills | Status: DC

## 2022-02-21 NOTE — Progress Notes (Signed)
Patient came in today to give a urine sample for pain while urinating. Patient did not give enough urine to send off for urine culture. She states that she will come back later today to give more sample for [place] to send off. tbw

## 2022-02-23 LAB — URINE CULTURE

## 2022-02-24 ENCOUNTER — Other Ambulatory Visit (HOSPITAL_BASED_OUTPATIENT_CLINIC_OR_DEPARTMENT_OTHER): Payer: Self-pay | Admitting: *Deleted

## 2022-02-24 DIAGNOSIS — R300 Dysuria: Secondary | ICD-10-CM

## 2022-02-25 ENCOUNTER — Ambulatory Visit: Payer: Medicare Other | Admitting: Internal Medicine

## 2022-03-05 ENCOUNTER — Encounter: Payer: Self-pay | Admitting: Internal Medicine

## 2022-03-05 ENCOUNTER — Ambulatory Visit (INDEPENDENT_AMBULATORY_CARE_PROVIDER_SITE_OTHER): Payer: Medicare Other | Admitting: Internal Medicine

## 2022-03-05 VITALS — BP 130/80 | HR 80 | Ht 64.5 in | Wt 180.0 lb

## 2022-03-05 DIAGNOSIS — R55 Syncope and collapse: Secondary | ICD-10-CM | POA: Diagnosis not present

## 2022-03-05 DIAGNOSIS — I480 Paroxysmal atrial fibrillation: Secondary | ICD-10-CM | POA: Diagnosis not present

## 2022-03-05 NOTE — Patient Instructions (Addendum)
Medication Instructions:  Your physician recommends that you continue on your current medications as directed. Please refer to the Current Medication list given to you today.  Labwork: None ordered.  Testing/Procedures: None ordered.  Follow-Up:  Your physician wants you to follow-up in: 3 months with Dr. Lovena Le.  You will receive a reminder letter in the mail two months in advance. If you don't receive a letter, please call our office to schedule the follow-up appointment.    Implantable Loop Recorder Placement, Care After This sheet gives you information about how to care for yourself after your procedure. Your health care provider may also give you more specific instructions. If you have problems or questions, contact your health care provider. What can I expect after the procedure? After the procedure, it is common to have: Soreness or discomfort near the incision. Some swelling or bruising near the incision.  Follow these instructions at home: Incision care  Monitor your cardiac device site for redness, swelling, and drainage. Call the device clinic at [phone] if you experience these symptoms or fever/chills.  Keep the large square bandage on your site for 72 hours and then you may remove it yourself. Keep the steri-strips underneath in place.   You may shower after 72 hours / 3 days from your procedure with the steri-strips in place. They will usually fall off on their own, or may be removed after 10 days. Pat dry.   Avoid lotions, ointments, or perfumes over your incision until it is well-healed.  Please do not submerge in water until your site is completely healed.   Your device is MRI compatible.   Remote monitoring is used to monitor your cardiac device from home. This monitoring is scheduled every month by our office. It allows [place] to keep an eye on the function of your device to ensure it is working properly.  If your wound site starts to bleed apply pressure.       If you have any questions/concerns please call the device clinic at [phone].  Activity  Return to your normal activities.  General instructions Follow instructions from your health care provider about how to manage your implantable loop recorder and transmit the information. Learn how to activate a recording if this is necessary for your type of device. You may go through a metal detection gate, and you may let someone hold a metal detector over your chest. Show your ID card if needed. Do not have an MRI unless you check with your health care provider first. Take over-the-counter and prescription medicines only as told by your health care provider. Keep all follow-up visits as told by your health care provider. This is important. Contact a health care provider if: You have redness, swelling, or pain around your incision. You have a fever. You have pain that is not relieved by your pain medicine. You have triggered your device because of fainting (syncope) or because of a heartbeat that feels like it is racing, slow, fluttering, or skipping (palpitations). Get help right away if you have: Chest pain. Difficulty breathing. Summary After the procedure, it is common to have soreness or discomfort near the incision. Change your dressing as told by your health care provider. Follow instructions from your health care provider about how to manage your implantable loop recorder and transmit the information. Keep all follow-up visits as told by your health care provider. This is important. This information is not intended to replace advice given to you by your health care provider. Make  sure you discuss any questions you have with your health care provider. Document Released: 08/20/2015 Document Revised: 10/24/2017 Document Reviewed: 10/24/2017 Elsevier Patient Education  2020 [employer].

## 2022-03-05 NOTE — Progress Notes (Signed)
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 She is a pleasant 76 yo woman with a h/o atrial fib when she developed Covid pneumonia, about 3 years ago. She has palpitations and a h/o syncope. She denies chest pain or sob. She is not a good candidate for systemic anti-coagulation. I discussed her case with Dr. Burt Knack. Our thoughts are that we woould like to know if she has been having more atrial fib and if so, consider watchman consideration. Also with her h/o syncope, there would be a consideration for monitoring for pauses or tachycardia.  Allergies  Allergen Reactions   Hydromorphone Hives   Iodixanol Swelling and Other (See Comments)    Other reaction(s): Other, Other (See Comments) Throat swelling   Metronidazole Hives   Fentanyl Itching   Ciprofloxacin Hives   Dilaudid [Hydromorphone Hcl] Hives     Current Outpatient Medications  Medication Sig Dispense Refill   Alum Hydroxide-Mag Carbonate (GAVISCON EXTRA STRENGTH) 160-105 MG CHEW 12/06/2012 Aluminum Hydrox-Magnesium Carb Oral Chewable 160 mg-105 mg  PO 1.0 TABLET(S), CHEWABLE daily  12/06/2012  active     betamethasone dipropionate 0.05 % cream SMARTSIG:1 sparingly Topical Daily     Cholecalciferol 50 MCG (2000 UT) CAPS Per patient taking twice a day     clobetasol (TEMOVATE) 0.05 % external solution APPLY TO AFFECTED AREA UPTO TWICE DAILY AS NEEDED (NOT TO FACE, GROIN, OR UNDERARMS)     diazepam (VALIUM) 10 MG tablet Take 1 tablet (10 mg total) by mouth every 12 (twelve) hours as needed for anxiety. 60 tablet 5   diphenoxylate-atropine (LOMOTIL) 2.5-0.025 MG tablet Take 1 tablet by mouth 4 (four) times daily as needed for diarrhea or loose stools. 120 tablet 1   Emollient (AQUAPHOR EX) Apply 1 application. topically as needed.     gabapentin (NEURONTIN) 600 MG tablet Take 1 tablet (600 mg total) by mouth 3 (three) times daily. 270 tablet 1   hydrOXYzine (VISTARIL) 25 MG capsule Take 2 capsules (50 mg total) by mouth every 6  (six) hours. 720 capsule 0   ibuprofen (ADVIL) 200 MG tablet 12/06/2012 Ibuprofen Oral  PO 1.0 TABLET(S) Q8H PRN pain  12/06/2012  active     omeprazole (PRILOSEC) 40 MG capsule TAKE ONE CAPSULE EACH DAY 90 capsule 1   ondansetron (ZOFRAN-ODT) 8 MG disintegrating tablet Take 1 tablet (8 mg total) by mouth every 8 (eight) hours as needed for nausea or vomiting. 20 tablet 1   Probiotic Product (ALIGN PO) Take by mouth daily.     promethazine (PHENERGAN) 25 MG suppository Place 1 suppository (25 mg total) rectally every 6 (six) hours as needed for nausea or vomiting. 20 each 0   sodium chloride 1 g tablet Take 1 g by mouth daily.      triamcinolone cream (KENALOG) 0.1 % Apply topically as needed.     valACYclovir (VALTREX) 1000 MG tablet Take by mouth as needed.     zolpidem (AMBIEN) 10 MG tablet 12/06/2012 Zolpidem Oral  PO 1.0 TABLET(S) QHS PRN sleep  12/06/2012  active     nitrofurantoin, macrocrystal-monohydrate, (MACROBID) 100 MG capsule Take 1 capsule (100 mg total) by mouth 2 (two) times daily. (Patient not taking: Reported on 03/05/2022) 10 capsule 0   No current facility-administered medications for this visit.     Past Medical History:  Diagnosis Date   Allergy    Anemia    Anxiety    Arthritis    degenerative disc disorder   Cardiac arrhythmia  Depression    Diverticulitis    Diverticulosis    GERD (gastroesophageal reflux disease)    Heart murmur    HTN (hypertension)    Infertility, female    Neuromuscular disorder ([facility])    idiopathic peripheral  neuropathy   Peripheral neuropathy    Positive PPD    SIADH (syndrome of inappropriate ADH production) ([place])     ROS:   All systems reviewed and negative except as noted in the HPI.   Past Surgical History:  Procedure Laterality Date   BREAST BIOPSY Left    BREAST EXCISIONAL BIOPSY Left    benign   CERVICAL BIOPSY  W/ LOOP ELECTRODE EXCISION     CERVICAL POLYPECTOMY  02/2016   COLONOSCOPY     COLPOSCOPY      DILATION AND CURETTAGE OF UTERUS     HYSTEROSCOPY     LUMBAR DISC SURGERY     LUMBAR LAMINECTOMY     TONSILLECTOMY     UPPER GASTROINTESTINAL ENDOSCOPY       Family History  Problem Relation Age of Onset   Breast cancer Mother    Glaucoma Mother    Miscarriages / Stillbirths Mother    Heart attack Father    Depression Father    Early death Father    Heart disease Father    Tuberculosis Paternal Grandmother    Prostate cancer Paternal Grandfather    Depression Brother    Hypertension Brother    Colon cancer Neg Hx    Esophageal cancer Neg Hx    Rectal cancer Neg Hx    Stomach cancer Neg Hx      Social History   Socioeconomic History   Marital status: Married    Spouse name: John   Number of children: 0   Years of education: [employer] education level: Not on file  Occupational History   Occupation: retired physician  Tobacco Use   Smoking status: Never   Smokeless tobacco: Never  Vaping Use   Vaping Use: Never used  Substance and Sexual Activity   Alcohol use: Yes    Alcohol/week: 7.0 standard drinks of alcohol    Types: 7 Glasses of wine per week   Drug use: No   Sexual activity: Not Currently    Birth control/protection: Post-menopausal  Other Topics Concern   Not on file  Social History Narrative   Lives at home with her husband.   Right-handed.   1-2 cups caffeine per day.   Retired Stage manager   Social Determinants of Radio broadcast assistant Strain: Not on file  Food Insecurity: Not on file  Transportation Needs: Not on file  Physical Activity: Not on file  Stress: Not on file  Social Connections: Not on file  Intimate Partner Violence: Not on file     BP 130/80   Pulse 80   Ht 5' 4.5" (1.638 m)   Wt 180 lb (81.6 kg) Comment: per pt refused weight. weight given by patient  LMP  (LMP Unknown)   SpO2 95%   BMI 30.42 kg/m   Physical Exam:  Well appearing NAD HEENT: Unremarkable Neck:  No JVD, no thyromegally Lymphatics:   No adenopathy Back:  No CVA tenderness Lungs:  Clear with no wheezes HEART:  Regular rate rhythm, no murmurs, no rubs, no clicks Abd:  soft, positive bowel sounds, no organomegally, no rebound, no guarding Ext:  2 plus pulses, no edema, no cyanosis, no clubbing Skin:  No rashes no nodules Neuro:  CN II through XII intact, motor grossly intact  Assess/Plan:  PAF - unclear if she has had any since her Covid insertion. She will undergo watchful waiting. Syncope - the etiology is unclear. She will undergo ILR insertion.  AI - she is stable at this point.   Cristopher Peru, MD  EP Procedure Note   Procedure performed: ILR insertion  Preoperative diagnosis: atrial fib monitoring. Unexplained syncope.  Postoperative diagnosis: same as preop diagnosis  Description of the Procedure: After informed consent was obtained, the patient was prepped and draped in a sterile manner. 10 cc of lidocaine was infiltrated. A one cm stab incision was carried out. The medtronic ILR, [id number] was inserted. The R waves measured 0.33m. Benzoin and steristrips were painted on the skin. A bandage was applied. Tegaderm was placed. The patient recovered in the usual manner.  Complications: none immediately.  Conclusion: successful ILR insertion.  GCarleene OverlieTaylor,MD

## 2022-03-07 ENCOUNTER — Telehealth: Payer: Self-pay

## 2022-03-07 NOTE — Telephone Encounter (Signed)
Patient has ILR and inquiring about when to remove the clear Tegaderm. Advised she may remove 72 hours after implant per discharge instructions. Advised there will be steri-strips in place under the large dressing and they will fall off by their selves. Voiced understanding and appreciative of call. Advised to call if there are further questions or concerns.

## 2022-03-07 NOTE — Telephone Encounter (Signed)
The patient has questions about her bandages. I told her the nurse will call her back.

## 2022-03-10 ENCOUNTER — Telehealth: Payer: Self-pay | Admitting: Family Medicine

## 2022-03-10 NOTE — Telephone Encounter (Signed)
Copied from [place] [phone]. Topic: Medicare AWV >> Mar 10, 2022 11:40 AM Devoria Glassing wrote: Reason for CRM: Left message for patient to schedule Annual Wellness Visit.  Please schedule with Nurse Health Advisor Charlott Rakes, RN at [facility].  Please call [phone] ask for [facility]

## 2022-03-11 ENCOUNTER — Other Ambulatory Visit: Payer: Self-pay | Admitting: Family Medicine

## 2022-03-11 ENCOUNTER — Ambulatory Visit (INDEPENDENT_AMBULATORY_CARE_PROVIDER_SITE_OTHER): Payer: Medicare Other

## 2022-03-11 DIAGNOSIS — Z0000 Encounter for general adult medical examination without abnormal findings: Secondary | ICD-10-CM | POA: Diagnosis not present

## 2022-03-11 MED ORDER — ZOLPIDEM TARTRATE 10 MG PO TABS
10.0000 mg | ORAL_TABLET | Freq: Every evening | ORAL | 0 refills | Status: DC | PRN
Start: 2022-03-11 — End: 2022-04-11

## 2022-03-11 NOTE — Progress Notes (Signed)
Virtual Visit via Telephone Note  I connected with  Katrina Benitez on 03/11/22 at  9:30 AM EDT by telephone and verified that I am speaking with the correct person using two identifiers.  Medicare Annual Wellness visit completed telephonically due to Covid-19 pandemic.   Persons participating in this call: This Health Coach and this patient.   Location: Patient: Home Provider: Office   I discussed the limitations, risks, security and privacy concerns of performing an evaluation and management service by telephone and the availability of in person appointments. The patient expressed understanding and agreed to proceed.  Unable to perform video visit due to video visit attempted and failed and/or patient does not have video capability.   Some vital signs may be absent or patient reported.   Willette Brace, LPN   Subjective:   Katrina Benitez is a 76 y.o. female who presents for Medicare Annual (Subsequent) preventive examination.  Review of Systems     Cardiac Risk Factors include: advanced age (>63mn, >>42women);dyslipidemia;obesity (BMI >30kg/m2)     Objective:    There were no vitals filed for this visit. There is no height or weight on file to calculate BMI.     03/11/2022    9:21 AM 12/16/2019    3:06 PM 01/19/2019    9:30 AM 01/22/2016    8:00 AM  Advanced Directives  Does Patient Have a Medical Advance Directive? Yes Yes No No  Type of AParamedicof AUvaldeLiving will    Does patient want to make changes to medical advance directive?  No - Patient declined    Copy of [place]? No - copy requested No - copy requested    Would patient like information on creating a medical advance directive?   No - Patient declined No - patient declined information    Current Medications (verified) Outpatient Encounter Medications as of 03/11/2022  Medication Sig   Alum Hydroxide-Mag Carbonate  (GAVISCON EXTRA STRENGTH) 160-105 MG CHEW 12/06/2012 Aluminum Hydrox-Magnesium Carb Oral Chewable 160 mg-105 mg  PO 1.0 TABLET(S), CHEWABLE daily  12/06/2012  active   betamethasone dipropionate 0.05 % cream SMARTSIG:1 sparingly Topical Daily   Cholecalciferol 50 MCG (2000 UT) CAPS Per patient taking twice a day   clobetasol (TEMOVATE) 0.05 % external solution APPLY TO AFFECTED AREA UPTO TWICE DAILY AS NEEDED (NOT TO FACE, GROIN, OR UNDERARMS)   diazepam (VALIUM) 10 MG tablet Take 1 tablet (10 mg total) by mouth every 12 (twelve) hours as needed for anxiety.   diphenoxylate-atropine (LOMOTIL) 2.5-0.025 MG tablet Take 1 tablet by mouth 4 (four) times daily as needed for diarrhea or loose stools.   Emollient (AQUAPHOR EX) Apply 1 application. topically as needed.   gabapentin (NEURONTIN) 600 MG tablet Take 1 tablet (600 mg total) by mouth 3 (three) times daily.   hydrOXYzine (VISTARIL) 25 MG capsule Take 2 capsules (50 mg total) by mouth every 6 (six) hours.   ibuprofen (ADVIL) 200 MG tablet 12/06/2012 Ibuprofen Oral  PO 1.0 TABLET(S) Q8H PRN pain  12/06/2012  active   omeprazole (PRILOSEC) 40 MG capsule TAKE ONE CAPSULE EACH DAY   ondansetron (ZOFRAN-ODT) 8 MG disintegrating tablet Take 1 tablet (8 mg total) by mouth every 8 (eight) hours as needed for nausea or vomiting.   Probiotic Product (ALIGN PO) Take by mouth daily.   promethazine (PHENERGAN) 25 MG suppository Place 1 suppository (25 mg total) rectally every 6 (six) hours as  needed for nausea or vomiting.   sodium chloride 1 g tablet Take 1 g by mouth daily.    triamcinolone cream (KENALOG) 0.1 % Apply topically as needed.   valACYclovir (VALTREX) 1000 MG tablet Take by mouth as needed.   zolpidem (AMBIEN) 10 MG tablet 12/06/2012 Zolpidem Oral  PO 1.0 TABLET(S) QHS PRN sleep  12/06/2012  active   [DISCONTINUED] nitrofurantoin, macrocrystal-monohydrate, (MACROBID) 100 MG capsule Take 1 capsule (100 mg total) by mouth 2 (two) times daily.  (Patient not taking: Reported on 03/05/2022)   No facility-administered encounter medications on file as of 03/11/2022.    Allergies (verified) Hydromorphone, Iodixanol, Metronidazole, Fentanyl, Ciprofloxacin, and Dilaudid [hydromorphone hcl]   History: Past Medical History:  Diagnosis Date   Allergy    Anemia    Anxiety    Arthritis    degenerative disc disorder   Cardiac arrhythmia    Depression    Diverticulitis    Diverticulosis    GERD (gastroesophageal reflux disease)    Heart murmur    HTN (hypertension)    Infertility, female    Neuromuscular disorder (HCC)    idiopathic peripheral  neuropathy   Peripheral neuropathy    Positive PPD    SIADH (syndrome of inappropriate ADH production) ([place])    Past Surgical History:  Procedure Laterality Date   BREAST BIOPSY Left    BREAST EXCISIONAL BIOPSY Left    benign   CERVICAL BIOPSY  W/ LOOP ELECTRODE EXCISION     CERVICAL POLYPECTOMY  02/2016   COLONOSCOPY     COLPOSCOPY     DILATION AND CURETTAGE OF UTERUS     HYSTEROSCOPY     LUMBAR DISC SURGERY     LUMBAR LAMINECTOMY     TONSILLECTOMY     UPPER GASTROINTESTINAL ENDOSCOPY     Family History  Problem Relation Age of Onset   Breast cancer Mother    Glaucoma Mother    Miscarriages / Stillbirths Mother    Heart attack Father    Depression Father    Early death Father    Heart disease Father    Tuberculosis Paternal Grandmother    Prostate cancer Paternal Grandfather    Depression Brother    Hypertension Brother    Colon cancer Neg Hx    Esophageal cancer Neg Hx    Rectal cancer Neg Hx    Stomach cancer Neg Hx    Social History   Socioeconomic History   Marital status: Married    Spouse name: John   Number of children: 0   Years of education: [employer] education level: Not on file  Occupational History   Occupation: retired physician  Tobacco Use   Smoking status: Never   Smokeless tobacco: Never  Vaping Use   Vaping Use: Never used   Substance and Sexual Activity   Alcohol use: Yes    Alcohol/week: 7.0 standard drinks of alcohol    Types: 7 Glasses of wine per week   Drug use: No   Sexual activity: Not Currently    Birth control/protection: Post-menopausal  Other Topics Concern   Not on file  Social History Narrative   Lives at home with her husband.   Right-handed.   1-2 cups caffeine per day.   Retired Stage manager   Social Determinants of Radio broadcast assistant Strain: [place]  (03/11/2022)   Overall Financial Resource Strain (CARDIA)    Difficulty of Paying Living Expenses: Not hard at all  Food Insecurity: No Food  Insecurity (03/11/2022)   Hunger Vital Sign    Worried About Running Out of Food in the Last Year: Never true    [place] in the Last Year: Never true  Transportation Needs: No Transportation Needs (03/11/2022)   PRAPARE - Hydrologist (Medical): No    Lack of Transportation (Non-Medical): No  Physical Activity: Inactive (03/11/2022)   Exercise Vital Sign    Days of Exercise per Week: 0 days    Minutes of Exercise per Session: 0 min  Stress: Stress Concern Present (03/11/2022)   [place]    Feeling of Stress : To some extent  Social Connections: Moderately Isolated (03/11/2022)   Social Connection and Isolation Panel [NHANES]    Frequency of Communication with Friends and Family: Three times a week    Frequency of Social Gatherings with Friends and Family: Never    Attends Religious Services: Never    Marine scientist or Organizations: No    Attends Music therapist: Never    Marital Status: Married    Tobacco Counseling Counseling given: Not Answered   Clinical Intake:  Pre-visit preparation completed: Yes  Pain : No/denies pain     BMI - recorded: 30.42 Nutritional Status: BMI > 30  Obese Nutritional Risks: None Diabetes: No  How often do you need  to have someone help you when you read instructions, pamphlets, or other written materials from your doctor or pharmacy?: 1 - Never  Diabetic?no  Interpreter Needed?: No  Information entered by :: Charlott Rakes, LPN   Activities of Daily Living    03/11/2022    9:24 AM  In your present state of health, do you have any difficulty performing the following activities:  Hearing? 0  Vision? 0  Difficulty concentrating or making decisions? 0  Walking or climbing stairs? 1  Comment very carefully  Dressing or bathing? 0  Doing errands, shopping? 0  Preparing Food and eating ? N  Using the Toilet? N  In the past six months, have you accidently leaked urine? Y  Comment wears a pad at night  Do you have problems with loss of bowel control? N  Managing your Medications? N  Managing your Finances? N  Housekeeping or managing your Housekeeping? N    Patient Care Team: Tawnya Crook, MD as PCP - General (Family Medicine) Sherren Mocha, MD as PCP - Cardiology (Cardiology) Sherren Mocha, MD as Consulting Physician (Cardiology) Irene Shipper, MD as Consulting Physician (Gastroenterology) Kathrynn Ducking, MD (Inactive) as Consulting Physician (Neurology) Megan Salon, MD as Consulting Physician (Gynecology) Lavonna Monarch, MD as Consulting Physician (Dermatology) Melissa Noon, [place] as Consulting Physician (Optometry)  Indicate any recent Medical Services you may have received from other than [facility] providers in the past year (date may be approximate).     Assessment:   This is a routine wellness examination for [facility].  Hearing/Vision screen Hearing Screening - Comments:: Pt denies any hearing issues  Vision Screening - Comments:: Pt follows up with Dr Delman Cheadle for annual eye exams   Dietary issues and exercise activities discussed: Current Exercise Habits: The patient does not participate in regular exercise at present   Goals Addressed             This Visit's Progress     Patient Stated       Lose weight and get back to exercise  Depression Screen    03/11/2022    9:19 AM 11/04/2021    3:19 PM 04/17/2021    3:09 PM 07/13/2020    4:08 PM 03/21/2020   11:19 AM 03/21/2020   11:18 AM 03/09/2020    2:46 PM  PHQ 2/9 Scores  PHQ - 2 Score 0 0 0 1 2 0 2  PHQ- 9 Score  0  '5 7  7    '$ Fall Risk    03/11/2022    9:22 AM 11/04/2021    3:10 PM 03/21/2020   11:18 AM 12/16/2019    3:07 PM  Fall Risk   Falls in the past year? 1 0 0 1  Number falls in past yr: 1 0  1  Injury with Fall? 0 0  0  Risk for fall due to : Impaired vision;Impaired balance/gait;Impaired mobility;History of fall(s)  No Fall Risks History of fall(s);Impaired mobility  Follow up Falls prevention discussed   Education provided;Falls prevention discussed;Falls evaluation completed    FALL RISK PREVENTION PERTAINING TO THE HOME:  Any stairs in or around the home? Yes  If so, are there any without handrails? No  Home free of loose throw rugs in walkways, pet beds, electrical cords, etc? Yes  Adequate lighting in your home to reduce risk of falls? Yes   ASSISTIVE DEVICES UTILIZED TO PREVENT FALLS:  Life alert? No Use of a cane, walker or w/c? No  Grab bars in the bathroom? Yes  Shower chair or bench in shower? Yes  Elevated toilet seat or a handicapped toilet? No   TIMED UP AND GO:  Was the test performed? No .  Cognitive Function:        12/16/2019    3:07 PM  6CIT Screen  What Year? 0 points  What month? 0 points  What time? 0 points  Count back from 20 0 points  Months in reverse 0 points  Repeat phrase 0 points  Total Score 0 points    Immunizations Immunization History  Administered Date(s) Administered   Fluad Quad(high Dose 65+) 06/29/2019, 07/13/2020   Influenza, High Dose Seasonal PF 08/12/2018, 10/01/2021   PFIZER(Purple Top)SARS-COV-2 Vaccination 10/14/2019, 11/03/2019, 06/21/2020   Pfizer Covid-19 Vaccine Bivalent Booster 75yr & up 10/01/2021    Pneumococcal Conjugate-13 10/05/2014   Pneumococcal Polysaccharide-23 09/01/2017   Tdap 07/07/2015   Zoster Recombinat (Shingrix) 05/28/2018, 08/12/2018    TDAP status: Up to date  Flu Vaccine status: Up to date  Pneumococcal vaccine status: Up to date  Covid-19 vaccine status: Completed vaccines  Qualifies for Shingles Vaccine? Yes   Zostavax completed Yes   Shingrix Completed?: Yes  Screening Tests Health Maintenance  Topic Date Due   INFLUENZA VACCINE  04/22/2022   TETANUS/TDAP  07/10/2025   COLONOSCOPY (Pts 45-449yrInsurance coverage will need to be confirmed)  06/06/2029   Pneumonia Vaccine 6581Years old  Completed   DEXA SCAN  Completed   COVID-19 Vaccine  Completed   Hepatitis C Screening  Completed   Zoster Vaccines- Shingrix  Completed   HPV VACCINES  Aged Out    Health Maintenance  There are no preventive care reminders to display for this patient.  Colorectal cancer screening: Type of screening: Colonoscopy. Completed 06/07/19. Repeat every 10 years  Mammogram status: Completed 06/27/21. Repeat every year  Bone Density status: Completed 03/28/19. Results reflect: Bone density results: OSTEOPENIA. Repeat every 2 years.   Additional Screening:  Hepatitis C Screening: Completed 06/02/16  Vision Screening: Recommended annual ophthalmology exams for  early detection of glaucoma and other disorders of the eye. Is the patient up to date with their annual eye exam?  Yes  Who is the provider or what is the name of the office in which the patient attends annual eye exams? Dr Delman Cheadle  If pt is not established with a provider, would they like to be referred to a provider to establish care? No .   Dental Screening: Recommended annual dental exams for proper oral hygiene  Community Resource Referral / Chronic Care Management: CRR required this visit?  No   CCM required this visit?  No      Plan:     I have personally reviewed and noted the following in the  patient's chart:   Medical and social history Use of alcohol, tobacco or illicit drugs  Current medications and supplements including opioid prescriptions.  Functional ability and status Nutritional status Physical activity Advanced directives List of other physicians Hospitalizations, surgeries, and ER visits in previous 12 months Vitals Screenings to include cognitive, depression, and falls Referrals and appointments  In addition, I have reviewed and discussed with patient certain preventive protocols, quality metrics, and best practice recommendations. A written personalized care plan for preventive services as well as general preventive health recommendations were provided to patient.     Willette Brace, LPN   6/68/1594   Nurse Notes: pt is requesting a refill for Ambien 10 mg @ HS PRN please advise

## 2022-03-11 NOTE — Patient Instructions (Signed)
Katrina Benitez , Thank you for taking time to come for your Medicare Wellness Visit. I appreciate your ongoing commitment to your health goals. Please review the following plan we discussed and let me know if I can assist you in the future.   Screening recommendations/referrals: Colonoscopy: Done 06/07/19 repeat every 10 years  Mammogram: Done 06/27/21 repeat every year  Bone Density: Done 03/28/19 repeat every 2 years  Recommended yearly ophthalmology/optometry visit for glaucoma screening and checkup Recommended yearly dental visit for hygiene and checkup  Vaccinations: Influenza vaccine: Done 10/01/21 repeat every year  Pneumococcal vaccine: Up to date Tdap vaccine: Done 07/07/15 repeat every 10 years  Shingles vaccine: Completed 9/6, 08/12/18   Covid-19:Completed 1/22, 2/11, & 06/21/20  Advanced directives: Please bring a copy of your health care power of attorney and living will to the office at your convenience.  Conditions/risks identified: Lose weight and exercise   Next appointment: Follow up in one year for your annual wellness visit    Preventive Care 65 Years and Older, Female Preventive care refers to lifestyle choices and visits with your health care provider that can promote health and wellness. What does preventive care include? A yearly physical exam. This is also called an annual well check. Dental exams once or twice a year. Routine eye exams. Ask your health care provider how often you should have your eyes checked. Personal lifestyle choices, including: Daily care of your teeth and gums. Regular physical activity. Eating a healthy diet. Avoiding tobacco and drug use. Limiting alcohol use. Practicing safe sex. Taking low-dose aspirin every day. Taking vitamin and mineral supplements as recommended by your health care provider. What happens during an annual well check? The services and screenings done by your health care provider during your annual well check will  depend on your age, overall health, lifestyle risk factors, and family history of disease. Counseling  Your health care provider may ask you questions about your: Alcohol use. Tobacco use. Drug use. Emotional well-being. Home and relationship well-being. Sexual activity. Eating habits. History of falls. Memory and ability to understand (cognition). Work and work Statistician. Reproductive health. Screening  You may have the following tests or measurements: Height, weight, and BMI. Blood pressure. Lipid and cholesterol levels. These may be checked every 5 years, or more frequently if you are over 64 years old. Skin check. Lung cancer screening. You may have this screening every year starting at age 2 if you have a 30-pack-year history of smoking and currently smoke or have quit within the past 15 years. Fecal occult blood test (FOBT) of the stool. You may have this test every year starting at age 3. Flexible sigmoidoscopy or colonoscopy. You may have a sigmoidoscopy every 5 years or a colonoscopy every 10 years starting at age 38. Hepatitis C blood test. Hepatitis B blood test. Sexually transmitted disease (STD) testing. Diabetes screening. This is done by checking your blood sugar (glucose) after you have not eaten for a while (fasting). You may have this done every 1-3 years. Bone density scan. This is done to screen for osteoporosis. You may have this done starting at age 22. Mammogram. This may be done every 1-2 years. Talk to your health care provider about how often you should have regular mammograms. Talk with your health care provider about your test results, treatment options, and if necessary, the need for more tests. Vaccines  Your health care provider may recommend certain vaccines, such as: Influenza vaccine. This is recommended every year. Tetanus, diphtheria,  and acellular pertussis (Tdap, Td) vaccine. You may need a Td booster every 10 years. Zoster vaccine. You may  need this after age 5. Pneumococcal 13-valent conjugate (PCV13) vaccine. One dose is recommended after age 35. Pneumococcal polysaccharide (PPSV23) vaccine. One dose is recommended after age 47. Talk to your health care provider about which screenings and vaccines you need and how often you need them. This information is not intended to replace advice given to you by your health care provider. Make sure you discuss any questions you have with your health care provider. Document Released: 10/05/2015 Document Revised: 05/28/2016 Document Reviewed: 07/10/2015 Elsevier Interactive Patient Education  2017 [place] Prevention in the Home Falls can cause injuries. They can happen to people of all ages. There are many things you can do to make your home safe and to help prevent falls. What can I do on the outside of my home? Regularly fix the edges of walkways and driveways and fix any cracks. Remove anything that might make you trip as you walk through a door, such as a raised step or threshold. Trim any bushes or trees on the path to your home. Use bright outdoor lighting. Clear any walking paths of anything that might make someone trip, such as rocks or tools. Regularly check to see if handrails are loose or broken. Make sure that both sides of any steps have handrails. Any raised decks and porches should have guardrails on the edges. Have any leaves, snow, or ice cleared regularly. Use sand or salt on walking paths during winter. Clean up any spills in your garage right away. This includes oil or grease spills. What can I do in the bathroom? Use night lights. Install grab bars by the toilet and in the tub and shower. Do not use towel bars as grab bars. Use non-skid mats or decals in the tub or shower. If you need to sit down in the shower, use a plastic, non-slip stool. Keep the floor dry. Clean up any water that spills on the floor as soon as it happens. Remove soap buildup in  the tub or shower regularly. Attach bath mats securely with double-sided non-slip rug tape. Do not have throw rugs and other things on the floor that can make you trip. What can I do in the bedroom? Use night lights. Make sure that you have a light by your bed that is easy to reach. Do not use any sheets or blankets that are too big for your bed. They should not hang down onto the floor. Have a firm chair that has side arms. You can use this for support while you get dressed. Do not have throw rugs and other things on the floor that can make you trip. What can I do in the kitchen? Clean up any spills right away. Avoid walking on wet floors. Keep items that you use a lot in easy-to-reach places. If you need to reach something above you, use a strong step stool that has a grab bar. Keep electrical cords out of the way. Do not use floor polish or wax that makes floors slippery. If you must use wax, use non-skid floor wax. Do not have throw rugs and other things on the floor that can make you trip. What can I do with my stairs? Do not leave any items on the stairs. Make sure that there are handrails on both sides of the stairs and use them. Fix handrails that are broken or loose. Make sure  that handrails are as long as the stairways. Check any carpeting to make sure that it is firmly attached to the stairs. Fix any carpet that is loose or worn. Avoid having throw rugs at the top or bottom of the stairs. If you do have throw rugs, attach them to the floor with carpet tape. Make sure that you have a light switch at the top of the stairs and the bottom of the stairs. If you do not have them, ask someone to add them for you. What else can I do to help prevent falls? Wear shoes that: Do not have high heels. Have rubber bottoms. Are comfortable and fit you well. Are closed at the toe. Do not wear sandals. If you use a stepladder: Make sure that it is fully opened. Do not climb a closed  stepladder. Make sure that both sides of the stepladder are locked into place. Ask someone to hold it for you, if possible. Clearly mark and make sure that you can see: Any grab bars or handrails. First and last steps. Where the edge of each step is. Use tools that help you move around (mobility aids) if they are needed. These include: Canes. Walkers. Scooters. Crutches. Turn on the lights when you go into a dark area. Replace any light bulbs as soon as they burn out. Set up your furniture so you have a clear path. Avoid moving your furniture around. If any of your floors are uneven, fix them. If there are any pets around you, be aware of where they are. Review your medicines with your doctor. Some medicines can make you feel dizzy. This can increase your chance of falling. Ask your doctor what other things that you can do to help prevent falls. This information is not intended to replace advice given to you by your health care provider. Make sure you discuss any questions you have with your health care provider. Document Released: 07/05/2009 Document Revised: 02/14/2016 Document Reviewed: 10/13/2014 Elsevier Interactive Patient Education  2017 [employer].

## 2022-03-17 ENCOUNTER — Encounter (HOSPITAL_BASED_OUTPATIENT_CLINIC_OR_DEPARTMENT_OTHER): Payer: Self-pay | Admitting: Obstetrics & Gynecology

## 2022-03-19 ENCOUNTER — Other Ambulatory Visit: Payer: Self-pay | Admitting: *Deleted

## 2022-03-19 ENCOUNTER — Telehealth: Payer: Self-pay | Admitting: Family Medicine

## 2022-03-19 ENCOUNTER — Other Ambulatory Visit: Payer: Medicare Other

## 2022-03-19 ENCOUNTER — Other Ambulatory Visit: Payer: Self-pay | Admitting: Family Medicine

## 2022-03-19 DIAGNOSIS — E222 Syndrome of inappropriate secretion of antidiuretic hormone: Secondary | ICD-10-CM

## 2022-03-19 DIAGNOSIS — E871 Hypo-osmolality and hyponatremia: Secondary | ICD-10-CM

## 2022-03-19 NOTE — Telephone Encounter (Signed)
Lab orders placed for today and next month.

## 2022-03-19 NOTE — Telephone Encounter (Signed)
Patient stated that she did not request refill for 90 days, even though 30 was sent last time with no refills. Patient stated that if 90 is cheaper than that would be fine. Patient scheduled a virtual visit on tomorrow to discuss some things from last visit per patient.

## 2022-03-19 NOTE — Telephone Encounter (Signed)
Patient called stating she arrived at [facility] lab to complete her monthly BMP labs, but there is no lab order. She reports that she has always had a standing order for a BMP to completed monthly and believes this can't be completed now since she recently changed to Dr. Cherlynn Kaiser as a PCP. Pt can be reached at [phone].

## 2022-03-20 ENCOUNTER — Encounter: Payer: Self-pay | Admitting: Family Medicine

## 2022-03-20 ENCOUNTER — Telehealth (INDEPENDENT_AMBULATORY_CARE_PROVIDER_SITE_OTHER): Payer: Medicare Other | Admitting: Family Medicine

## 2022-03-20 DIAGNOSIS — F5101 Primary insomnia: Secondary | ICD-10-CM | POA: Diagnosis not present

## 2022-03-20 DIAGNOSIS — E222 Syndrome of inappropriate secretion of antidiuretic hormone: Secondary | ICD-10-CM | POA: Diagnosis not present

## 2022-03-20 DIAGNOSIS — R269 Unspecified abnormalities of gait and mobility: Secondary | ICD-10-CM | POA: Diagnosis not present

## 2022-03-20 DIAGNOSIS — E669 Obesity, unspecified: Secondary | ICD-10-CM | POA: Diagnosis not present

## 2022-03-20 NOTE — Patient Instructions (Signed)
It was very nice to see you today!  Keep log of what eating and drinking and work on exercise   PLEASE NOTE:  If you had any lab tests please let us know if you have not heard back within a few days. You may see your results on MyChart before we have a chance to review them but we will give you a call once they are reviewed by [place]. If we ordered any referrals today, please let us know if you have not heard from their office within the next week.   Please try these tips to maintain a healthy lifestyle:  Eat most of your calories during the day when you are active. Eliminate processed foods including packaged sweets (pies, cakes, cookies), reduce intake of potatoes, white bread, white pasta, and white rice. Look for whole grain options, oat flour or almond flour.  Each meal should contain half fruits/vegetables, one quarter protein, and one quarter carbs (no bigger than a computer mouse).  Cut down on sweet beverages. This includes juice, soda, and sweet tea. Also watch fruit intake, though this is a healthier sweet option, it still contains natural sugar! Limit to 3 servings daily.  Drink at least 1 glass of water with each meal and aim for at least 8 glasses per day  Exercise at least 150 minutes every week.

## 2022-03-20 NOTE — Progress Notes (Signed)
MyChart Video Visit    Virtual Visit via Video Note   This visit type was conducted due to national recommendations for restrictions regarding the COVID-19 Pandemic (e.g. social distancing) in an effort to limit this patient's exposure and mitigate transmission in our community. This patient is at least at moderate risk for complications without adequate follow up. This format is felt to be most appropriate for this patient at this time. Physical exam was limited by quality of the video and audio technology used for the visit. CMA was able to get the patient set up on a video visit.  Patient location: Home. Patient and provider in visit Provider location: Office  I discussed the limitations of evaluation and management by telemedicine and the availability of in person appointments. The patient expressed understanding and agreed to proceed.  Visit Date: 03/20/2022  Today's healthcare provider: Wellington Hampshire, MD     Subjective:    Patient ID: Katrina Benitez, female    DOB: 1945-10-28, 76 y.o.   MRN: [id number]  Chief Complaint  Patient presents with   Discuss medication    HPI Wt-wants to lose wt- not eating much.  Busy all day long in house.  Just wt is up, clothes tight and wants meds.  Told slow gastric emptying.  Not eating much at all.  Stopped eating ice cream.  Wants to try [facility].  Doing treadmill some.  Walks outside occ. Doesn't want to get on scale as "too depressing"  Has loop recorder to assess afib burden Unsteady on feet.so not able to do a lot of exercise.   Still trying to go thru boxes from move 8 yrs ago.    Hyponatremia-needs standing order for monthly bmp-order placed yesterday, but pt had already left lab.   Insomnia-occ ambien.  Pharm sent request for 90-pt only needs 30.     Past Medical History:  Diagnosis Date   Allergy    Anemia    Anxiety    Arthritis    degenerative disc disorder   Cardiac arrhythmia    Depression    Diverticulitis     Diverticulosis    GERD (gastroesophageal reflux disease)    Heart murmur    HTN (hypertension)    Infertility, female    Neuromuscular disorder (HCC)    idiopathic peripheral  neuropathy   Peripheral neuropathy    Positive PPD    SIADH (syndrome of inappropriate ADH production) ([place])     Past Surgical History:  Procedure Laterality Date   BREAST BIOPSY Left    BREAST EXCISIONAL BIOPSY Left    benign   CERVICAL BIOPSY  W/ LOOP ELECTRODE EXCISION     CERVICAL POLYPECTOMY  02/2016   COLONOSCOPY     COLPOSCOPY     DILATION AND CURETTAGE OF UTERUS     HYSTEROSCOPY     LUMBAR DISC SURGERY     LUMBAR LAMINECTOMY     TONSILLECTOMY     UPPER GASTROINTESTINAL ENDOSCOPY      Outpatient Medications Prior to Visit  Medication Sig Dispense Refill   Alum Hydroxide-Mag Carbonate (GAVISCON EXTRA STRENGTH) 160-105 MG CHEW 12/06/2012 Aluminum Hydrox-Magnesium Carb Oral Chewable 160 mg-105 mg  PO 1.0 TABLET(S), CHEWABLE daily  12/06/2012  active     betamethasone dipropionate 0.05 % cream SMARTSIG:1 sparingly Topical Daily     Cholecalciferol 50 MCG (2000 UT) CAPS Per patient taking twice a day     clobetasol (TEMOVATE) 0.05 % external solution APPLY TO AFFECTED AREA UPTO  TWICE DAILY AS NEEDED (NOT TO FACE, GROIN, OR UNDERARMS)     diazepam (VALIUM) 10 MG tablet Take 1 tablet (10 mg total) by mouth every 12 (twelve) hours as needed for anxiety. 60 tablet 5   diphenoxylate-atropine (LOMOTIL) 2.5-0.025 MG tablet Take 1 tablet by mouth 4 (four) times daily as needed for diarrhea or loose stools. 120 tablet 1   Emollient (AQUAPHOR EX) Apply 1 application. topically as needed.     gabapentin (NEURONTIN) 600 MG tablet Take 1 tablet (600 mg total) by mouth 3 (three) times daily. 270 tablet 1   hydrOXYzine (VISTARIL) 25 MG capsule Take 2 capsules (50 mg total) by mouth every 6 (six) hours. 720 capsule 0   ibuprofen (ADVIL) 200 MG tablet 12/06/2012 Ibuprofen Oral  PO 1.0 TABLET(S) Q8H PRN pain  12/06/2012   active     levocetirizine (XYZAL) 5 MG tablet      omeprazole (PRILOSEC) 40 MG capsule TAKE ONE CAPSULE EACH DAY 90 capsule 1   ondansetron (ZOFRAN-ODT) 8 MG disintegrating tablet Take 1 tablet (8 mg total) by mouth every 8 (eight) hours as needed for nausea or vomiting. 20 tablet 1   Probiotic Product (ALIGN PO) Take by mouth daily.     promethazine (PHENERGAN) 25 MG suppository Place 1 suppository (25 mg total) rectally every 6 (six) hours as needed for nausea or vomiting. 20 each 0   sodium chloride 1 g tablet Take 1 g by mouth daily.      triamcinolone cream (KENALOG) 0.1 % Apply topically as needed.     valACYclovir (VALTREX) 1000 MG tablet Take by mouth as needed.     zolpidem (AMBIEN) 10 MG tablet Take 1 tablet (10 mg total) by mouth at bedtime as needed for sleep. 30 tablet 0   betamethasone valerate ointment (VALISONE) 0.1 %      No facility-administered medications prior to visit.    Allergies  Allergen Reactions   Hydromorphone Hives   Iodixanol Swelling and Other (See Comments)    Other reaction(s): Other, Other (See Comments) Throat swelling   Metronidazole Hives   Fentanyl Itching   Ciprofloxacin Hives   Dilaudid [Hydromorphone Hcl] Hives   ROS-some n/v abd pain-delayed gastric emptying and gallstones.     Objective:     Physical Exam  Vitals and nursing note reviewed.  Constitutional:      General:  is not in acute distress.    Appearance: Normal appearance.  HENT:     Head: Normocephalic.  Pulmonary:     Effort: No respiratory distress.  Skin:    General: Skin is dry.     Coloration: Skin is not pale.  Neurological:     Mental Status: Pt is alert and oriented to person, place, and time.  Psychiatric:        Mood and Affect: Mood normal.   LMP  (LMP Unknown)   Wt Readings from Last 3 Encounters:  03/05/22 180 lb (81.6 kg)  01/09/21 190 lb 0.6 oz (86.2 kg)  11/16/20 190 lb 0.6 oz (86.2 kg)       Assessment & Plan:   Problem List Items  Addressed This Visit       Endocrine   SIADH (syndrome of inappropriate ADH production) ([place])     Other   Abnormality of gait   Primary insomnia   Other Visit Diagnoses     Obesity (BMI 30.0-34.9)    -  Primary      SIADH-will try to do standing order  for monthly bmp.   Abn of gait-hard to exercise.  Trying to do what she can Insomnia-takes valium at times, takes [place] occ-doesn't need 90day like pharm requested. Obesity-pt will not get wt done.  Advised need wt for any justification of meds.  Discussed meds-wegovy prob not covered on ins and pt w/slowed gastric emptying so contraindicated.  Phentermine contraindicated d/t a fib.  Wellbutrin(Contrave)-didn't work for moods so not want to take for wt.   Per pt, "not eat much".  Discussed meds are designed to control appetite so if not eating much, they won't do anything anyway.  Work on [employer].  Has f/u in august so she will re-consider.   No orders of the defined types were placed in this encounter.   I discussed the assessment and treatment plan with the patient. The patient was provided an opportunity to ask questions and all were answered. The patient agreed with the plan and demonstrated an understanding of the instructions.   The patient was advised to call back or seek an in-person evaluation if the symptoms worsen or if the condition fails to improve as anticipated.  I provided 40 minutes of face-to-face time during this encounter.   Wellington Hampshire, MD [place] [phone] (phone) [phone] (fax)  [place]

## 2022-03-27 ENCOUNTER — Other Ambulatory Visit (HOSPITAL_BASED_OUTPATIENT_CLINIC_OR_DEPARTMENT_OTHER): Payer: Self-pay | Admitting: Obstetrics & Gynecology

## 2022-03-27 ENCOUNTER — Telehealth: Payer: Self-pay | Admitting: Internal Medicine

## 2022-03-27 ENCOUNTER — Other Ambulatory Visit: Payer: Self-pay

## 2022-03-27 ENCOUNTER — Telehealth (INDEPENDENT_AMBULATORY_CARE_PROVIDER_SITE_OTHER): Payer: Medicare Other | Admitting: Obstetrics & Gynecology

## 2022-03-27 DIAGNOSIS — K8020 Calculus of gallbladder without cholecystitis without obstruction: Secondary | ICD-10-CM

## 2022-03-27 DIAGNOSIS — R112 Nausea with vomiting, unspecified: Secondary | ICD-10-CM

## 2022-03-27 DIAGNOSIS — R3915 Urgency of urination: Secondary | ICD-10-CM | POA: Diagnosis not present

## 2022-03-27 DIAGNOSIS — R109 Unspecified abdominal pain: Secondary | ICD-10-CM

## 2022-03-27 MED ORDER — MIRABEGRON ER 50 MG PO TB24: 50.0000 mg | ORAL_TABLET | Freq: Every day | ORAL | 2 refills | Status: DC

## 2022-03-27 NOTE — Telephone Encounter (Signed)
Pt states she has been having more frequent episodes of abdominal pain along with nausea and vomiting. She has had GES and US done, has OV with Dr. Henrene Pastor 04/01/22. She reports that the phenergan and zofran do not help. She wanted to know if Dr. Henrene Pastor wanted her to have any other testing done and if so could we go ahead and order it prior to appt 7/11. Please advise.

## 2022-03-27 NOTE — Progress Notes (Unsigned)
Virtual Visit via Video Note  I connected with Katrina Benitez on 03/27/22 at  by a video enabled telemedicine application and verified that I am speaking with the correct person using two identifiers.  Location: Patient: home Provider: office   I discussed the limitations of evaluation and management by telemedicine and the availability of in person appointments. The patient expressed understanding and agreed to proceed.  History of Present Illness:    Observations/Objective:   Assessment and Plan:   Follow Up Instructions:    I discussed the assessment and treatment plan with the patient. The patient was provided an opportunity to ask questions and all were answered. The patient agreed with the plan and demonstrated an understanding of the instructions.   The patient was advised to call back or seek an in-person evaluation if the symptoms worsen or if the condition fails to improve as anticipated.  I provided *** minutes of non-face-to-face time during this encounter.   Megan Salon, MD

## 2022-03-27 NOTE — Telephone Encounter (Signed)
She has gallstones.  Lets check a HIDA scan to rule out chronic cholecystitis

## 2022-03-27 NOTE — Telephone Encounter (Signed)
Order placed, pt aware and knows she will be contacted by rad. Scheduling to contact pt regarding appt. Pt also given the phone number to call if she does not hear from them, [phone].

## 2022-03-27 NOTE — Telephone Encounter (Signed)
Inbound call from patient stating that she had an appointment with Dr. Henrene Pastor on 7/11 at 11:20. Patient is being seen for nausea, and patient stated it has gotten worse. Patient is requesting a call back to discuss. Please advise.

## 2022-03-30 NOTE — Progress Notes (Signed)
Virtual Visit via Video Note  I connected with Katrina Benitez on 03/30/22 at  1:45 PM EDT by a video enabled telemedicine application and verified that I am speaking with the correct person using two identifiers.  Location: Patient: home Provider: office   I discussed the limitations of evaluation and management by telemedicine and the availability of in person appointments. The patient expressed understanding and agreed to proceed.  History of Present Illness: 76 yo G0 MWF with h/o intermittent dysuria and urinary urgency that feels like a UTI to pt.  Urine cultures have been normal.  Pt feels antibiotics to help at times but feel we should discuss additional evaluation or options.  Possible IC or OAB discussed.  Recommended treatment with AZO initially with dysuria to see if will resolve symptoms.  Also, IC diet discussed with pt.  Pt does want to try treatment for persistent urinary urgency as well.  Suggested trial with myrbetriq 50g daily dosage.  Increased risks of hypertension discussed.  Pt has blood pressure cuff at home.  Recommended checking one weekly.  Recommended trial of this for one month.     Observations/Objective: WNWD WF, NAD  Assessment and Plan: Urinary urgency  Follow Up Instructions: Will try one month of Myrbetriq '50mg'$  daily.   Did recommend if pt has dysuria again to try OTC AZO instead of requesting antibiotics as several urine cultures have been negative.   I discussed the assessment and treatment plan with the patient. The patient was provided an opportunity to ask questions and all were answered. The patient agreed with the plan and demonstrated an understanding of the instructions.   The patient was advised to call back or seek an in-person evaluation if the symptoms worsen or if the condition fails to improve as anticipated.  I provided 71mnutes of non-face-to-face time during this encounter.   MMegan Salon MD

## 2022-04-01 ENCOUNTER — Ambulatory Visit (HOSPITAL_COMMUNITY)
Admission: RE | Admit: 2022-04-01 | Discharge: 2022-04-01 | Disposition: A | Discharge status: Home / Self Care | Payer: Medicare Other | Source: Ambulatory Visit | Attending: Internal Medicine | Admitting: Internal Medicine

## 2022-04-01 ENCOUNTER — Other Ambulatory Visit (INDEPENDENT_AMBULATORY_CARE_PROVIDER_SITE_OTHER): Payer: Medicare Other

## 2022-04-01 ENCOUNTER — Encounter: Payer: Self-pay | Admitting: Internal Medicine

## 2022-04-01 ENCOUNTER — Ambulatory Visit (INDEPENDENT_AMBULATORY_CARE_PROVIDER_SITE_OTHER): Payer: Medicare Other | Admitting: Internal Medicine

## 2022-04-01 VITALS — BP 102/60 | HR 89 | Ht 65.5 in | Wt 180.0 lb

## 2022-04-01 DIAGNOSIS — R112 Nausea with vomiting, unspecified: Secondary | ICD-10-CM | POA: Diagnosis not present

## 2022-04-01 DIAGNOSIS — K219 Gastro-esophageal reflux disease without esophagitis: Secondary | ICD-10-CM | POA: Diagnosis not present

## 2022-04-01 DIAGNOSIS — K8020 Calculus of gallbladder without cholecystitis without obstruction: Secondary | ICD-10-CM

## 2022-04-01 DIAGNOSIS — R109 Unspecified abdominal pain: Secondary | ICD-10-CM | POA: Diagnosis not present

## 2022-04-01 DIAGNOSIS — E222 Syndrome of inappropriate secretion of antidiuretic hormone: Secondary | ICD-10-CM | POA: Diagnosis not present

## 2022-04-01 LAB — BASIC METABOLIC PANEL
BUN: 16 mg/dL (ref 6–23)
CO2: 29 mEq/L (ref 19–32)
Calcium: 9.9 mg/dL (ref 8.4–10.5)
Chloride: 99 mEq/L (ref 96–112)
Creatinine, Ser: 1.12 mg/dL (ref 0.40–1.20)
GFR: 47.96 mL/min — ABNORMAL LOW (ref >60.00)
Glucose, Bld: 98 mg/dL (ref 70–99)
Potassium: 4.9 mEq/L (ref 3.5–5.1)
Sodium: 136 mEq/L (ref 135–145)

## 2022-04-01 MED ORDER — TECHNETIUM TC 99M MEBROFENIN IV KIT
5.0000 | PACK | Freq: Once | INTRAVENOUS | Status: AC | PRN
Start: 2022-04-01 — End: 2022-04-01
  Administered 2022-04-01: 5 via INTRAVENOUS

## 2022-04-01 NOTE — Patient Instructions (Signed)
If you are age 75 or older, your body mass index should be between 23-30. Your Body mass index is 29.5 kg/m. If this is out of the aforementioned range listed, please consider follow up with your Primary Care Provider.  If you are age 8 or younger, your body mass index should be between 19-25. Your Body mass index is 29.5 kg/m. If this is out of the aformentioned range listed, please consider follow up with your Primary Care Provider.   ________________________________________________________  The [place] [facility] providers would like to encourage you to use [facility] to communicate with providers for non-urgent requests or questions.  Due to long hold times on the telephone, sending your provider a message by [facility] may be a faster and more efficient way to get a response.  Please allow 48 business hours for a response.  Please remember that this is for non-urgent requests.  _______________________________________________________  Please follow up as needed

## 2022-04-01 NOTE — Progress Notes (Signed)
HISTORY OF PRESENT ILLNESS:  Katrina Benitez is a 76 y.o. female, retired Surveyor, quantity, who presents today regarding problems with intermittent abdominal pain followed by nausea and vomiting.  She is accompanied today by her husband Katrina Benitez (retired Stage manager).  I last saw the patient November 25, 2021 regarding problems with nausea and infrequent vomiting.  She does have GERD and remains on omeprazole daily.  At that time we performed both solid-phase gastric emptying scan and abdominal ultrasound.  She was prescribed Zofran.  Abdominal ultrasound revealed gallstones.  Gastric emptying scan revealed mild gastroparesis.  Patient presents today reporting that over the past 6 months she has had ongoing problems with progressive nausea followed by abdominal pain and vomiting.  Quite severe at times.  Occurs sporadically 2-3 times per month.  The pain is in the upper abdomen.  She does not vomit undigested food.  She usually feels poorly for about 24 hours after an episode.  Feels okay in between.  Has been using Zofran and Phenergan suppositories as needed for nausea.  She did contact the office recently reporting that she had an upcoming appointment regarding these symptoms.  Interval HIDA scan was ordered which was performed earlier today.  REVIEW OF SYSTEMS:  All non-GI ROS negative unless otherwise stated in the HPI.  Past Medical History:  Diagnosis Date   Allergy    Anemia    Anxiety    Arthritis    degenerative disc disorder   Cardiac arrhythmia    Depression    Diverticulitis    Diverticulosis    GERD (gastroesophageal reflux disease)    Heart murmur    HTN (hypertension)    Infertility, female    Neuromuscular disorder (HCC)    idiopathic peripheral  neuropathy   Peripheral neuropathy    Positive PPD    SIADH (syndrome of inappropriate ADH production) ([place])     Past Surgical History:  Procedure Laterality Date   BREAST BIOPSY Left    BREAST EXCISIONAL BIOPSY Left    benign    CERVICAL BIOPSY  W/ LOOP ELECTRODE EXCISION     CERVICAL POLYPECTOMY  02/2016   COLONOSCOPY     COLPOSCOPY     DILATION AND CURETTAGE OF UTERUS     HYSTEROSCOPY     LUMBAR [place] SURGERY     LUMBAR LAMINECTOMY     TONSILLECTOMY     UPPER GASTROINTESTINAL ENDOSCOPY      Social History [employer]  reports that she has never smoked. She has never used smokeless tobacco. She reports current alcohol use of about 7.0 standard drinks of alcohol per week. She reports that she does not use drugs.  family history includes Breast cancer in her mother; Depression in her brother and father; Early death in her father; Glaucoma in her mother; Heart attack in her father; Heart disease in her father; Hypertension in her brother; Miscarriages / [place] in her mother; Prostate cancer in her paternal grandfather; Tuberculosis in her paternal grandmother.  Allergies  Allergen Reactions   Hydromorphone Hives   Iodixanol Swelling and Other (See Comments)    Other reaction(s): Other, Other (See Comments) Throat swelling   Metronidazole Hives   Fentanyl Itching   Ciprofloxacin Hives   Dilaudid [Hydromorphone Hcl] Hives       PHYSICAL EXAMINATION: Vital signs: BP 102/60   Pulse 89   Ht 5' 5.5" (1.664 m)   Wt 180 lb (81.6 kg)   LMP  (LMP Unknown)   BMI 29.50 kg/m  Constitutional: generally well-appearing, no acute distress Psychiatric: alert and oriented x3, cooperative Eyes: extraocular movements intact, anicteric, conjunctiva pink Mouth: oral pharynx moist, no lesions Neck: supple no lymphadenopathy Cardiovascular: heart regular rate and rhythm, no murmur Lungs: clear to auscultation bilaterally Abdomen: soft, nontender, nondistended, no obvious ascites, no peritoneal signs, normal bowel sounds, no organomegaly Rectal: Omitted Extremities: no lower extremity edema bilaterally Skin: no lesions on visible extremities Neuro: No focal deficits.  Cranial nerves  intact  ASSESSMENT:  1.  Problems with nausea, abdominal pain, and vomiting as described.  Suspect biliary colic 2.  Mild gastroparesis 3.  GERD.  EGD September 2020 revealed incidental esophageal ring and hiatal hernia, but was otherwise normal 4.  History of adenomatous colon polyps.  Last colonoscopy September 2020.  Aged out of surveillance.   PLAN:  1.  Reflux precautions 2.  Continue PPI 3.  Antiemetics as needed 4.  Follow-up on HIDA scan 5.  Likely surgical referral for symptomatic gallstones.  ADDENDUM: The patient's HIDA scan returned normal.  She is felt to have symptomatic gallstones with intermittent biliary colic to explain her symptom complex.  We will make referral to [place] [facility] for consideration of laparoscopic cholecystectomy.  The patient has been informed.

## 2022-04-02 ENCOUNTER — Other Ambulatory Visit: Payer: Self-pay | Admitting: *Deleted

## 2022-04-02 DIAGNOSIS — E871 Hypo-osmolality and hyponatremia: Secondary | ICD-10-CM

## 2022-04-03 DIAGNOSIS — H25013 Cortical age-related cataract, bilateral: Secondary | ICD-10-CM | POA: Diagnosis not present

## 2022-04-03 DIAGNOSIS — H2513 Age-related nuclear cataract, bilateral: Secondary | ICD-10-CM | POA: Diagnosis not present

## 2022-04-09 ENCOUNTER — Ambulatory Visit (INDEPENDENT_AMBULATORY_CARE_PROVIDER_SITE_OTHER): Payer: Medicare Other

## 2022-04-09 DIAGNOSIS — R55 Syncope and collapse: Secondary | ICD-10-CM | POA: Diagnosis not present

## 2022-04-10 LAB — CUP PACEART REMOTE DEVICE CHECK
Date Time Interrogation Session: 20230719221644
Implantable Pulse Generator Implant Date: 20230614

## 2022-04-11 ENCOUNTER — Other Ambulatory Visit: Payer: Self-pay | Admitting: Family Medicine

## 2022-04-11 MED ORDER — ZOLPIDEM TARTRATE 10 MG PO TABS: 10.0000 mg | ORAL_TABLET | Freq: Every evening | ORAL | 0 refills | Status: DC | PRN

## 2022-04-11 NOTE — Telephone Encounter (Signed)
I think this was sent to me by mistake.

## 2022-04-11 NOTE — Telephone Encounter (Signed)
Request sent to PCP for approval.

## 2022-04-11 NOTE — Telephone Encounter (Signed)
Name: Katrina Benitez Phone: [phone] Provider: Dr Cherlynn Kaiser  Patient called requesting a refill for Zolpidem.  She stated she need Dr Cherlynn Kaiser to send to pharmacy she is out of medication. Please advised.  Thanks [employer]

## 2022-04-15 DIAGNOSIS — H2511 Age-related nuclear cataract, right eye: Secondary | ICD-10-CM | POA: Diagnosis not present

## 2022-04-15 DIAGNOSIS — H25011 Cortical age-related cataract, right eye: Secondary | ICD-10-CM | POA: Diagnosis not present

## 2022-04-15 DIAGNOSIS — H269 Unspecified cataract: Secondary | ICD-10-CM | POA: Diagnosis not present

## 2022-04-28 ENCOUNTER — Other Ambulatory Visit: Payer: Self-pay | Admitting: Family Medicine

## 2022-05-02 ENCOUNTER — Encounter: Payer: Self-pay | Admitting: Family Medicine

## 2022-05-02 ENCOUNTER — Other Ambulatory Visit: Payer: Self-pay | Admitting: *Deleted

## 2022-05-02 DIAGNOSIS — Z6831 Body mass index (BMI) 31.0-31.9, adult: Secondary | ICD-10-CM

## 2022-05-02 DIAGNOSIS — Z8639 Personal history of other endocrine, nutritional and metabolic disease: Secondary | ICD-10-CM

## 2022-05-02 DIAGNOSIS — M2550 Pain in unspecified joint: Secondary | ICD-10-CM

## 2022-05-02 DIAGNOSIS — E871 Hypo-osmolality and hyponatremia: Secondary | ICD-10-CM

## 2022-05-05 NOTE — Progress Notes (Signed)
Carelink Summary Report / Loop Recorder 

## 2022-05-07 ENCOUNTER — Encounter: Payer: Self-pay | Admitting: Internal Medicine

## 2022-05-07 ENCOUNTER — Ambulatory Visit: Payer: Self-pay | Admitting: Surgery

## 2022-05-07 ENCOUNTER — Telehealth: Payer: Self-pay | Admitting: *Deleted

## 2022-05-07 DIAGNOSIS — R1011 Right upper quadrant pain: Secondary | ICD-10-CM | POA: Diagnosis not present

## 2022-05-07 DIAGNOSIS — K8020 Calculus of gallbladder without cholecystitis without obstruction: Secondary | ICD-10-CM | POA: Diagnosis not present

## 2022-05-07 NOTE — Telephone Encounter (Signed)
   Name: Lamona Eimer  DOB: 04/23/1946  MRN: [id number]  Primary Cardiologist: Sherren Mocha, MD   Preoperative team, please contact this patient and set up a phone call appointment for further preoperative risk assessment. Please obtain consent and complete medication review. Thank you for your help.  I confirm that guidance regarding antiplatelet and oral anticoagulation therapy has been completed and, if necessary, noted below (none requested).  Lenna Sciara, NP 05/07/2022, 4:10 PM [place]

## 2022-05-07 NOTE — Telephone Encounter (Signed)
   Pre-operative Risk Assessment    Patient Name: Katrina Benitez  DOB: 1946-07-04 MRN: [id number]      Request for Surgical Clearance    Procedure:   LAPAROSCOPIC CHOLECYSTECTOMY  Date of Surgery:  Clearance TBD                                 Surgeon:  Armandina Gemma, MD Surgeon's Group or Practice Name:  [facility] Phone number:  [phone] Fax number:  [phone]   Type of Clearance Requested:   - Medical    Type of Anesthesia:  General    Additional requests/questions:    [name]   05/07/2022, 1:02 PM

## 2022-05-07 NOTE — Progress Notes (Signed)
[facility]  Patient Information: Name:  Katrina Benitez  DOB:  Mar 19, 1946  MRN:  [id number]    Procedure:   LAPAROSCOPIC CHOLECYSTECTOMY   Date of Surgery:  Clearance TBD                                 Surgeon:  Armandina Gemma, MD Surgeon's Group or Practice Name:  [facility] Phone number:  [phone] Fax number:  [phone]   Type of Clearance Requested:   - Medical    Type of Anesthesia:  General  Device Information:  Clinic EP Physician:  Cristopher Peru, MD   Device Type:  Medtronic Loop Recorder Manufacturer and Phone #:  [employer]onic: [phone] Pacemaker Dependent?:   Electrophysiologist's Recommendations:  Patient has loop recorder, this device does not provide any kind of therapy No recommendations   Per Device Clinic Standing Orders, Wanda Plump, RN  2:12 PM 05/07/2022

## 2022-05-08 ENCOUNTER — Telehealth: Payer: Self-pay | Admitting: *Deleted

## 2022-05-08 NOTE — Telephone Encounter (Signed)
Pt has been scheduled for a tele visit 05/14/22 11:40.

## 2022-05-08 NOTE — Telephone Encounter (Signed)
Pt has been scheduled for a tele visit, 05/14/22 11:40.  Medications reconciled / consent on file.    Patient Consent for Virtual Visit        Katrina Benitez has provided verbal consent on 05/08/2022 for a virtual visit (video or telephone).   CONSENT FOR VIRTUAL VISIT FOR:  Katrina Benitez  By participating in this virtual visit I agree to the following:  I hereby voluntarily request, consent and authorize [facility] and its employed or contracted physicians, physician assistants, nurse practitioners or other licensed health care professionals (the Practitioner), to provide me with telemedicine health care services (the "Services") as deemed necessary by the treating Practitioner. I acknowledge and consent to receive the Services by the Practitioner via telemedicine. I understand that the telemedicine visit will involve communicating with the Practitioner through live audiovisual communication technology and the disclosure of certain medical information by electronic transmission. I acknowledge that I have been given the opportunity to request an in-person assessment or other available alternative prior to the telemedicine visit and am voluntarily participating in the telemedicine visit.  I understand that I have the right to withhold or withdraw my consent to the use of telemedicine in the course of my care at any time, without affecting my right to future care or treatment, and that the Practitioner or I may terminate the telemedicine visit at any time. I understand that I have the right to inspect all information obtained and/or recorded in the course of the telemedicine visit and may receive copies of available information for a reasonable fee.  I understand that some of the potential risks of receiving the Services via telemedicine include:  Delay or interruption in medical evaluation due to technological equipment failure or disruption; Information transmitted may not be sufficient (e.g.  poor resolution of images) to allow for appropriate medical decision making by the Practitioner; and/or  In rare instances, security protocols could fail, causing a breach of personal health information.  Furthermore, I acknowledge that it is my responsibility to provide information about my medical history, conditions and care that is complete and accurate to the best of my ability. I acknowledge that Practitioner's advice, recommendations, and/or decision may be based on factors not within their control, such as incomplete or inaccurate data provided by me or distortions of diagnostic images or specimens that may result from electronic transmissions. I understand that the practice of medicine is not an exact science and that Practitioner makes no warranties or guarantees regarding treatment outcomes. I acknowledge that a copy of this consent can be made available to me via my patient portal ([facility]), or I can request a printed copy by calling the office of [facility].    I understand that my insurance will be billed for this visit.   I have read or had this consent read to me. I understand the contents of this consent, which adequately explains the benefits and risks of the Services being provided via telemedicine.  I have been provided ample opportunity to ask questions regarding this consent and the Services and have had my questions answered to my satisfaction. I give my informed consent for the services to be provided through the use of telemedicine in my medical care

## 2022-05-12 ENCOUNTER — Other Ambulatory Visit: Payer: Self-pay | Admitting: Family Medicine

## 2022-05-12 ENCOUNTER — Ambulatory Visit (INDEPENDENT_AMBULATORY_CARE_PROVIDER_SITE_OTHER): Payer: Medicare Other

## 2022-05-12 DIAGNOSIS — R55 Syncope and collapse: Secondary | ICD-10-CM | POA: Diagnosis not present

## 2022-05-12 NOTE — Addendum Note (Signed)
Addended by: Zacarias Pontes on: 05/12/2022 10:27 AM   Modules accepted: Orders

## 2022-05-14 ENCOUNTER — Ambulatory Visit (INDEPENDENT_AMBULATORY_CARE_PROVIDER_SITE_OTHER): Payer: Medicare Other | Admitting: General Practice

## 2022-05-14 DIAGNOSIS — Z01810 Encounter for preprocedural cardiovascular examination: Secondary | ICD-10-CM | POA: Diagnosis not present

## 2022-05-14 LAB — CUP PACEART REMOTE DEVICE CHECK
Date Time Interrogation Session: 20230821221848
Implantable Pulse Generator Implant Date: 20230614

## 2022-05-14 NOTE — Progress Notes (Signed)
Virtual Visit via Telephone Note   Because of Katrina Benitez's co-morbid illnesses, she is at least at moderate risk for complications without adequate follow up.  This format is felt to be most appropriate for this patient at this time.  The patient did not have access to video technology/had technical difficulties with video requiring transitioning to audio format only (telephone).  All issues noted in this document were discussed and addressed.  No physical exam could be performed with this format.  Please refer to the patient's chart for her consent to telehealth for [facility].  Evaluation Performed:  Preoperative cardiovascular risk assessment _____________   Date:  05/14/2022   Patient ID:  Katrina Benitez, Katrina Benitez 1946-05-12, MRN [id number] Patient Location:  Home Provider location:   Office  Primary Care Provider:  Tawnya Crook, MD Primary Cardiologist:  Sherren Mocha, MD  Chief Complaint / Patient Profile   75 y.o. y/o female with a h/o HTN, A-fib, GERD, hyperlipidemia who is pending laparoscopic cholecystectomy, Dr. Armandina Gemma and presents today for telephonic preoperative cardiovascular risk assessment.  Past Medical History    Past Medical History:  Diagnosis Date   Allergy    Anemia    Anxiety    Arthritis    degenerative disc disorder   Cardiac arrhythmia    Depression    Diverticulitis    Diverticulosis    GERD (gastroesophageal reflux disease)    Heart murmur    HTN (hypertension)    Infertility, female    Neuromuscular disorder (HCC)    idiopathic peripheral  neuropathy   Peripheral neuropathy    Positive PPD    SIADH (syndrome of inappropriate ADH production) ([place])    Past Surgical History:  Procedure Laterality Date   BREAST BIOPSY Left    BREAST EXCISIONAL BIOPSY Left    benign   CERVICAL BIOPSY  W/ LOOP ELECTRODE EXCISION     CERVICAL POLYPECTOMY  02/2016   COLONOSCOPY     COLPOSCOPY     DILATION AND CURETTAGE OF UTERUS      HYSTEROSCOPY     LUMBAR DISC SURGERY     LUMBAR LAMINECTOMY     TONSILLECTOMY     UPPER GASTROINTESTINAL ENDOSCOPY      Allergies  Allergies  Allergen Reactions   Hydromorphone Hives   Iodixanol Swelling and Other (See Comments)    Other reaction(s): Other, Other (See Comments) Throat swelling   Metronidazole Hives   Fentanyl Itching   Ciprofloxacin Hives   Dilaudid [Hydromorphone Hcl] Hives    History of Present Illness    Katrina Benitez is a 76 y.o. female who presents via audio/video conferencing for a telehealth visit today.  Pt was last seen in cardiology clinic on 01/27/2022 by Dr. Lovena Le.  At that time Katrina Benitez was doing well, there was question for paroxysmal atrial fibrillation (underwent loop recorder placement on 03/05/2022).  The patient is now pending procedure as outlined above. Since her last visit, she remained stable from a cardiac standpoint.  Today she denies chest pain, shortness of breath, lower extremity edema, fatigue, palpitations, melena, hematuria, hemoptysis, diaphoresis, weakness, presyncope, syncope, orthopnea, and PND.    Home Medications    Prior to Admission medications   Medication Sig Start Date End Date Taking? Authorizing Provider  Alum Hydroxide-Mag Carbonate (GAVISCON EXTRA STRENGTH) 160-105 MG CHEW 12/06/2012 Aluminum Hydrox-Magnesium Carb Oral Chewable 160 mg-105 mg  PO 1.0 TABLET(S), CHEWABLE daily  12/06/2012  active 12/06/12   [provider]  betamethasone dipropionate 0.05 % cream SMARTSIG:1 sparingly Topical Daily 10/30/21   [provider]  Cholecalciferol 50 MCG (2000 UT) CAPS Per patient taking twice a day    [provider]  clobetasol (TEMOVATE) 0.05 % external solution APPLY TO AFFECTED AREA UPTO TWICE DAILY AS NEEDED (NOT TO FACE, GROIN, OR UNDERARMS) 06/13/19   [provider]  diazepam (VALIUM) 10 MG tablet TAKE ONE TABLET EVERY 12 HOURS AS NEEDED FOR ANXIETY 05/12/22   Tawnya Crook, MD   diphenoxylate-atropine (LOMOTIL) 2.5-0.025 MG tablet Take 1 tablet by mouth 4 (four) times daily as needed for diarrhea or loose stools. 05/23/20   Orma Flaming, MD  Emollient (AQUAPHOR EX) Apply 1 application. topically as needed.    [provider]  gabapentin (NEURONTIN) 600 MG tablet TAKE ONE TABLET THREE TIMES DAILY 04/29/22   Tawnya Crook, MD  hydrOXYzine (VISTARIL) 25 MG capsule Take 2 capsules (50 mg total) by mouth every 6 (six) hours. 02/11/22   Tawnya Crook, MD  ibuprofen (ADVIL) 200 MG tablet 12/06/2012 Ibuprofen Oral  PO 1.0 TABLET(S) Q8H PRN pain  12/06/2012  active 12/06/12   [provider]  mirabegron ER (MYRBETRIQ) 50 MG TB24 tablet Take 1 tablet (50 mg total) by mouth daily. One po qd 03/27/22   Megan Salon, MD  ondansetron (ZOFRAN-ODT) 8 MG disintegrating tablet Take 1 tablet (8 mg total) by mouth every 8 (eight) hours as needed for nausea or vomiting. 11/25/21   Irene Shipper, MD  Probiotic Product (ALIGN PO) Take by mouth daily.    [provider]  promethazine (PHENERGAN) 25 MG suppository Place 1 suppository (25 mg total) rectally every 6 (six) hours as needed for nausea or vomiting. 10/23/21   Irene Shipper, MD  Pumpkin Seed-Soy Germ (AZO BLADDER CONTROL/GO-LESS PO) Take 1 capsule by mouth daily.    [provider]  sodium chloride 1 g tablet Take 1 g by mouth daily.     [provider]  triamcinolone cream (KENALOG) 0.1 % Apply topically as needed. 07/10/21   [provider]  valACYclovir (VALTREX) 1000 MG tablet Take by mouth as needed. 07/10/21   [provider]  zolpidem (AMBIEN) 10 MG tablet Take 1 tablet (10 mg total) by mouth at bedtime as needed. for sleep 04/11/22   Tawnya Crook, MD    Physical Exam    Vital Signs:  Katrina Benitez does not have vital signs available for review today.  Given telephonic nature of communication, physical exam is limited. AAOx3. NAD. Normal affect.  Speech and  respirations are unlabored.  Accessory Clinical Findings    None  Assessment & Plan    1.  Preoperative Cardiovascular Risk Assessment: Laparoscopic cholecystectomy, Dr. Armandina Gemma, CCS      Primary Cardiologist: Sherren Mocha, MD  Chart reviewed as part of pre-operative protocol coverage. Given past medical history and time since last visit, based on ACC/AHA guidelines, Katrina Benitez would be at acceptable risk for the planned procedure without further cardiovascular testing.   Patient was advised that if she develops new symptoms prior to surgery to contact our office to arrange a follow-up appointment.  She verbalized understanding.   Her RCRI is a class III risk, 6.6% risk of major cardiac event.  She is able to complete greater than 4 METS of physical activity.    A copy of this note will be routed to requesting surgeon.  Time:   Today, I have spent 9  minutes with the patient with telehealth technology discussing medical history, symptoms, and management plan.     Deberah Pelton, NP  05/14/2022, 8:37 AM

## 2022-05-16 NOTE — Patient Instructions (Signed)
SURGICAL WAITING ROOM VISITATION Patients having surgery or a procedure may have no more than 2 support people in the waiting area - these visitors may rotate.   Children under the age of 92 must have an adult with them who is not the patient. If the patient needs to stay at the hospital during part of their recovery, the visitor guidelines for inpatient rooms apply. Pre-op nurse will coordinate an appropriate time for 1 support person to accompany patient in pre-op.  This support person may not rotate.    Please refer to the [facility] website for the visitor guidelines for Inpatients (after your surgery is over and you are in a regular room).       Your procedure is scheduled on:  05/22/22    Report to [facility] Main Entrance    Report to admitting at  015 AM   Call this number if you have problems the morning of surgery [phone]   Do not eat food :After Midnight.   After Midnight you may have the following liquids until ____ 0815__ AM  DAY OF SURGERY  Water Non-Citrus Juices (without pulp, NO RED) Carbonated Beverages Black Coffee (NO MILK/CREAM OR CREAMERS, sugar ok)  Clear Tea (NO MILK/CREAM OR CREAMERS, sugar ok) regular and decaf                             Plain Jell-O (NO RED)                                           Fruit ices (not with fruit pulp, NO RED)                                     Popsicles (NO RED)                                                               Sports drinks like Gatorade (NO RED)                    The day of surgery:  Drink ONE (1) Pre-Surgery Clear Ensure or G2 at  0815 AM  ( have completed by ) the morning of surgery. Drink in one sitting. Do not sip.  This drink was given to you during your hospital  pre-op appointment visit. Nothing else to drink after completing the  Pre-Surgery Clear Ensure or G2.          Oral Hygiene is also important to reduce your risk of infection.                                     Remember - BRUSH YOUR TEETH THE MORNING OF SURGERY WITH YOUR REGULAR TOOTHPASTE   Do NOT smoke after Midnight   Take these medicines the morning of surgery with A SIP OF WATER:  eye drops as usual, gabapentin, myrbetriq   DO NOT TAKE ANY ORAL DIABETIC MEDICATIONS DAY OF YOUR SURGERY  Bring CPAP mask and tubing day of surgery.                              You may not have any metal on your body including hair pins, jewelry, and body piercing             Do not wear make-up, lotions, powders, perfumes/cologne, or deodorant  Do not wear nail polish including gel and S&S, artificial/acrylic nails, or any other type of covering on natural nails including finger and toenails. If you have artificial nails, gel coating, etc. that needs to be removed by a nail salon please have this removed prior to surgery or surgery may need to be canceled/ delayed if the surgeon/ anesthesia feels like they are unable to be safely monitored.   Do not shave  48 hours prior to surgery.               Men may shave face and neck.   Do not bring valuables to the hospital. [place].   Contacts, dentures or bridgework may not be worn into surgery.   Bring small overnight bag day of surgery.   DO NOT [place]. PHARMACY WILL DISPENSE MEDICATIONS LISTED ON YOUR MEDICATION LIST TO YOU DURING YOUR ADMISSION [place]!    Patients discharged on the day of surgery will not be allowed to drive home.  Someone NEEDS to stay with you for the first 24 hours after anesthesia.   Special Instructions: Bring a copy of your healthcare power of attorney and living will documents         the day of surgery if you haven't scanned them before.              Please read over the following fact sheets you were given: IF YOU HAVE QUESTIONS ABOUT YOUR PRE-OP INSTRUCTIONS PLEASE CALL [phone]     [facility] - Preparing for Surgery Before  surgery, you can play an important role.  Because skin is not sterile, your skin needs to be as free of germs as possible.  You can reduce the number of germs on your skin by washing with CHG (chlorahexidine gluconate) soap before surgery.  CHG is an antiseptic cleaner which kills germs and bonds with the skin to continue killing germs even after washing. Please DO NOT use if you have an allergy to CHG or antibacterial soaps.  If your skin becomes reddened/irritated stop using the CHG and inform your nurse when you arrive at Short Stay. Do not shave (including legs and underarms) for at least 48 hours prior to the first CHG shower.  You may shave your face/neck. Please follow these instructions carefully:  1.  Shower with CHG Soap the night before surgery and the  morning of Surgery.  2.  If you choose to wash your hair, wash your hair first as usual with your  normal  shampoo.  3.  After you shampoo, rinse your hair and body thoroughly to remove the  shampoo.                           4.  Use CHG as you would any other liquid soap.  You can apply chg directly  to the skin and wash  Gently with a scrungie or clean washcloth.  5.  Apply the CHG Soap to your body ONLY FROM THE NECK DOWN.   Do not use on face/ open                           Wound or open sores. Avoid contact with eyes, ears mouth and genitals (private parts).                       Wash face,  Genitals (private parts) with your normal soap.             6.  Wash thoroughly, paying special attention to the area where your surgery  will be performed.  7.  Thoroughly rinse your body with warm water from the neck down.  8.  DO NOT shower/wash with your normal soap after using and rinsing off  the CHG Soap.                9.  Pat yourself dry with a clean towel.            10.  Wear clean pajamas.            11.  Place clean sheets on your bed the night of your first shower and do not  sleep with pets. Day of Surgery  : Do not apply any lotions/deodorants the morning of surgery.  Please wear clean clothes to the hospital/surgery center.  FAILURE TO FOLLOW THESE INSTRUCTIONS MAY RESULT IN THE CANCELLATION OF YOUR SURGERY PATIENT SIGNATURE_________________________________  NURSE SIGNATURE__________________________________  ________________________________________________________________________

## 2022-05-16 NOTE — Progress Notes (Signed)
Anesthesia Review:  PCP: Cardiologist : Preop exam with Coletta Memos, NP on 05/14/22.  Chest x-ray : EKG : 01/27/2022  05/14/22- last Device check  Echo : 02/15/21  Monitor- 04/01/21  Stress test: Cardiac Cath :  Activity level:  Sleep Study/ CPAP : Fasting Blood Sugar :      / Checks Blood Sugar -- times a day:   Blood Thinner/ Instructions /Last Dose: ASA / Instructions/ Last Dose :

## 2022-05-18 ENCOUNTER — Encounter (HOSPITAL_COMMUNITY): Payer: Self-pay | Admitting: Surgery

## 2022-05-18 ENCOUNTER — Encounter: Payer: Self-pay | Admitting: Internal Medicine

## 2022-05-18 DIAGNOSIS — K8020 Calculus of gallbladder without cholecystitis without obstruction: Secondary | ICD-10-CM | POA: Diagnosis present

## 2022-05-18 DIAGNOSIS — R1011 Right upper quadrant pain: Secondary | ICD-10-CM | POA: Diagnosis present

## 2022-05-18 NOTE — H&P (Signed)
REFERRING PHYSICIAN: Eustace Quail., MD  PROVIDER: Maiya Kates Charlotta Newton, MD   Chief Complaint: New Consultation (Symptomatic cholelithiasis)  History of Present Illness:  Patient is referred by Dr. Scarlette Shorts for surgical evaluation and management of symptomatic cholelithiasis. Patient notes a longstanding history of intermittent emesis without explanation. Over the past 8 months the patient has developed intermittent abdominal pain associated with nausea and emesis. Episodes may last from 1 to 2 days. Patient does not note any particularly causative actions or food consumption. Patient denies fevers or chills. She denies jaundice or acholic stools. There is no family history of gallbladder disease. Patient underwent abdominal ultrasound in April 2023 which showed multiple shadowing gallstones with layering. There was no wall thickening or signs of inflammation. Patient underwent nuclear medicine hepatobiliary scan on April 01, 2022. This was a normal study with an ejection fraction of 60%. There was no evidence of obstruction. Previous abdominal surgery includes laparoscopy for infertility evaluation. Otherwise she has had no prior abdominal surgery. Patient is a retired Surveyor, quantity.  Review of Systems: A complete review of systems was obtained from the patient. I have reviewed this information and discussed as appropriate with the patient. See HPI as well for other ROS.  Review of Systems  Constitutional: Negative.  HENT: Negative.  Eyes: Negative.  Respiratory: Negative.  Cardiovascular: Negative.  Gastrointestinal: Positive for abdominal pain, nausea and vomiting.  Genitourinary: Negative.  Musculoskeletal: Negative.  Skin: Positive for itching.  Neurological: Negative.  Endo/Heme/Allergies: Negative.  Psychiatric/Behavioral: Negative.   Medical History: Past Medical History:  Diagnosis Date  Arrhythmia  Arthritis  Hypertension   Patient Active Problem List   Diagnosis  Right upper quadrant abdominal pain  Symptomatic cholelithiasis   Past Surgical History:  Procedure Laterality Date  BREAST BIOPSY Left  SPINE SURGERY    Allergies  Allergen Reactions  Ciprofloxacin Hives and Itching  Hydromorphone Hives  Iodixanol Other (See Comments) and Swelling  Throat swelling Throat swelling  Metronidazole Hives  Metronidazole Benzoate Hives  Fentanyl Itching   Current Outpatient Medications on File Prior to Visit  Medication Sig Dispense Refill  cholecalciferol (VITAMIN D3) 400 unit tablet Take by mouth  diazePAM (VALIUM) 10 MG tablet TAKE ONE TABLET EVERY 12 HOURS AS NEEDED FOR ANXIETY  gabapentin (NEURONTIN) 600 MG tablet Take 1 tablet by mouth 3 (three) times daily  hydrOXYzine pamoate (VISTARIL) 25 MG capsule Take by mouth  sodium chloride 1,000 mg soluble tablet Take by mouth  zolpidem (AMBIEN) 10 mg tablet Take 10 mg by mouth at bedtime as needed  buPROPion (WELLBUTRIN XL) 150 MG XL tablet (Patient not taking: Reported on 05/07/2022)  fluorouraciL (EFUDEX) 5 % cream Apply to affected areas twice a day for three weeks. Expect redness and irritation. (Patient not taking: Reported on 05/07/2022) 40 g 1  omeprazole (PRILOSEC) 40 MG DR capsule omeprazole 40 mg capsule,delayed release (Patient not taking: Reported on 05/07/2022)   No current facility-administered medications on file prior to visit.   Family History  Problem Relation Age of Onset  Breast cancer Mother  27's  Coronary Artery Disease (Blocked arteries around heart) Father  High blood pressure (Hypertension) Brother    Social History   Tobacco Use  Smoking Status Never  Smokeless Tobacco Never    Social History   Socioeconomic History  Marital status: Married  Tobacco Use  Smoking status: Never  Smokeless tobacco: Never  Vaping Use  Vaping Use: Never used  Substance and Sexual Activity  Alcohol  use: Not Currently  Drug use: Never   Objective:   Vitals:   BP: 122/78  Pulse: 99  Temp: 36.7 C (98.1 F)  SpO2: 99%  Weight: 83.5 kg (184 lb)  Height: 166.4 cm (5' 5.5")   Body mass index is 30.15 kg/m.  Physical Exam   GENERAL APPEARANCE Comfortable, no acute issues Development: normal Gross deformities: none  SKIN Rash, lesions, ulcers: none Induration, erythema: none Nodules: none palpable  EYES Conjunctiva and lids: normal Pupils: equal and reactive  EARS, NOSE, MOUTH, THROAT External ears: no lesion or deformity External nose: no lesion or deformity Hearing: grossly normal  NECK Symmetric: yes Trachea: midline Thyroid: no palpable nodules in the thyroid bed  CHEST Respiratory effort: normal Retraction or accessory muscle use: no Breath sounds: normal bilaterally Rales, rhonchi, wheeze: none  CARDIOVASCULAR Auscultation: regular rhythm, normal rate Murmurs: none Pulses: radial pulse 2+ palpable Lower extremity edema: none  ABDOMEN Abdomen is soft without distention. There is a well-healed surgical wound just below the umbilicus. There are no palpable masses. There is no significant tenderness. There is no sign of any hernia. With inspiration there is no hepatosplenomegaly.  GENITOURINARY/RECTAL Not assessed  MUSCULOSKELETAL Station and gait: normal Digits and nails: no clubbing or cyanosis Muscle strength: grossly normal all extremities Range of motion: grossly normal all extremities Deformity: none  LYMPHATIC Cervical: none palpable Supraclavicular: none palpable  PSYCHIATRIC Oriented to person, place, and time: yes Mood and affect: normal for situation Judgment and insight: appropriate for situation    Assessment and Plan:   Right upper quadrant abdominal pain  Symptomatic cholelithiasis  Patient is referred by her gastroenterologist, Dr. Scarlette Shorts, for surgical consideration of laparoscopic cholecystectomy for treatment of symptomatic cholelithiasis. Patient is provided with written  literature on laparoscopic gallbladder surgery to review at home.  Today we discussed laparoscopic cholecystectomy. We reviewed her ultrasound report and her hepatobiliary scan. We discussed her symptoms. I have recommended proceeding with laparoscopic cholecystectomy with intraoperative cholangiography. We discussed the size and location of the surgical incisions. We discussed the length of the operation and her postoperative recovery. We discussed the potential need for open surgery. The patient understands and wishes to proceed with surgery in the near future. We will make arrangements for her procedure at [facility] at a time convenient for the patient.  Armandina Gemma, MD [facility] [place] practice Office: [phone]

## 2022-05-18 NOTE — H&P (View-Only) (Signed)
REFERRING PHYSICIAN: Eustace Quail., MD  PROVIDER: Jaedyn Lard Charlotta Newton, MD   Chief Complaint: New Consultation (Symptomatic cholelithiasis)  History of Present Illness:  Patient is referred by Dr. Scarlette Shorts for surgical evaluation and management of symptomatic cholelithiasis. Patient notes a longstanding history of intermittent emesis without explanation. Over the past 8 months the patient has developed intermittent abdominal pain associated with nausea and emesis. Episodes may last from 1 to 2 days. Patient does not note any particularly causative actions or food consumption. Patient denies fevers or chills. She denies jaundice or acholic stools. There is no family history of gallbladder disease. Patient underwent abdominal ultrasound in April 2023 which showed multiple shadowing gallstones with layering. There was no wall thickening or signs of inflammation. Patient underwent nuclear medicine hepatobiliary scan on April 01, 2022. This was a normal study with an ejection fraction of 60%. There was no evidence of obstruction. Previous abdominal surgery includes laparoscopy for infertility evaluation. Otherwise she has had no prior abdominal surgery. Patient is a retired Surveyor, quantity.  Review of Systems: A complete review of systems was obtained from the patient. I have reviewed this information and discussed as appropriate with the patient. See HPI as well for other ROS.  Review of Systems  Constitutional: Negative.  HENT: Negative.  Eyes: Negative.  Respiratory: Negative.  Cardiovascular: Negative.  Gastrointestinal: Positive for abdominal pain, nausea and vomiting.  Genitourinary: Negative.  Musculoskeletal: Negative.  Skin: Positive for itching.  Neurological: Negative.  Endo/Heme/Allergies: Negative.  Psychiatric/Behavioral: Negative.   Medical History: Past Medical History:  Diagnosis Date  Arrhythmia  Arthritis  Hypertension   Patient Active Problem List   Diagnosis  Right upper quadrant abdominal pain  Symptomatic cholelithiasis   Past Surgical History:  Procedure Laterality Date  BREAST BIOPSY Left  SPINE SURGERY    Allergies  Allergen Reactions  Ciprofloxacin Hives and Itching  Hydromorphone Hives  Iodixanol Other (See Comments) and Swelling  Throat swelling Throat swelling  Metronidazole Hives  Metronidazole Benzoate Hives  Fentanyl Itching   Current Outpatient Medications on File Prior to Visit  Medication Sig Dispense Refill  cholecalciferol (VITAMIN D3) 400 unit tablet Take by mouth  diazePAM (VALIUM) 10 MG tablet TAKE ONE TABLET EVERY 12 HOURS AS NEEDED FOR ANXIETY  gabapentin (NEURONTIN) 600 MG tablet Take 1 tablet by mouth 3 (three) times daily  hydrOXYzine pamoate (VISTARIL) 25 MG capsule Take by mouth  sodium chloride 1,000 mg soluble tablet Take by mouth  zolpidem (AMBIEN) 10 mg tablet Take 10 mg by mouth at bedtime as needed  buPROPion (WELLBUTRIN XL) 150 MG XL tablet (Patient not taking: Reported on 05/07/2022)  fluorouraciL (EFUDEX) 5 % cream Apply to affected areas twice a day for three weeks. Expect redness and irritation. (Patient not taking: Reported on 05/07/2022) 40 g 1  omeprazole (PRILOSEC) 40 MG DR capsule omeprazole 40 mg capsule,delayed release (Patient not taking: Reported on 05/07/2022)   No current facility-administered medications on file prior to visit.   Family History  Problem Relation Age of Onset  Breast cancer Mother  99's  Coronary Artery Disease (Blocked arteries around heart) Father  High blood pressure (Hypertension) Brother    Social History   Tobacco Use  Smoking Status Never  Smokeless Tobacco Never    Social History   Socioeconomic History  Marital status: Married  Tobacco Use  Smoking status: Never  Smokeless tobacco: Never  Vaping Use  Vaping Use: Never used  Substance and Sexual Activity  Alcohol  use: Not Currently  Drug use: Never   Objective:   Vitals:   BP: 122/78  Pulse: 99  Temp: 36.7 C (98.1 F)  SpO2: 99%  Weight: 83.5 kg (184 lb)  Height: 166.4 cm (5' 5.5")   Body mass index is 30.15 kg/m.  Physical Exam   GENERAL APPEARANCE Comfortable, no acute issues Development: normal Gross deformities: none  SKIN Rash, lesions, ulcers: none Induration, erythema: none Nodules: none palpable  EYES Conjunctiva and lids: normal Pupils: equal and reactive  EARS, NOSE, MOUTH, THROAT External ears: no lesion or deformity External nose: no lesion or deformity Hearing: grossly normal  NECK Symmetric: yes Trachea: midline Thyroid: no palpable nodules in the thyroid bed  CHEST Respiratory effort: normal Retraction or accessory muscle use: no Breath sounds: normal bilaterally Rales, rhonchi, wheeze: none  CARDIOVASCULAR Auscultation: regular rhythm, normal rate Murmurs: none Pulses: radial pulse 2+ palpable Lower extremity edema: none  ABDOMEN Abdomen is soft without distention. There is a well-healed surgical wound just below the umbilicus. There are no palpable masses. There is no significant tenderness. There is no sign of any hernia. With inspiration there is no hepatosplenomegaly.  GENITOURINARY/RECTAL Not assessed  MUSCULOSKELETAL Station and gait: normal Digits and nails: no clubbing or cyanosis Muscle strength: grossly normal all extremities Range of motion: grossly normal all extremities Deformity: none  LYMPHATIC Cervical: none palpable Supraclavicular: none palpable  PSYCHIATRIC Oriented to person, place, and time: yes Mood and affect: normal for situation Judgment and insight: appropriate for situation    Assessment and Plan:   Right upper quadrant abdominal pain  Symptomatic cholelithiasis  Patient is referred by her gastroenterologist, Dr. Scarlette Shorts, for surgical consideration of laparoscopic cholecystectomy for treatment of symptomatic cholelithiasis. Patient is provided with written  literature on laparoscopic gallbladder surgery to review at home.  Today we discussed laparoscopic cholecystectomy. We reviewed her ultrasound report and her hepatobiliary scan. We discussed her symptoms. I have recommended proceeding with laparoscopic cholecystectomy with intraoperative cholangiography. We discussed the size and location of the surgical incisions. We discussed the length of the operation and her postoperative recovery. We discussed the potential need for open surgery. The patient understands and wishes to proceed with surgery in the near future. We will make arrangements for her procedure at [facility] at a time convenient for the patient.  Armandina Gemma, MD [facility] A [place] practice Office: [phone]

## 2022-05-19 ENCOUNTER — Encounter (HOSPITAL_COMMUNITY): Payer: Self-pay

## 2022-05-19 ENCOUNTER — Other Ambulatory Visit: Payer: Self-pay

## 2022-05-19 ENCOUNTER — Encounter (HOSPITAL_COMMUNITY): Payer: Self-pay | Admitting: Surgery

## 2022-05-19 ENCOUNTER — Inpatient Hospital Stay (HOSPITAL_COMMUNITY)
Admission: RE | Admit: 2022-05-19 | Discharge: 2022-05-19 | Disposition: A | Discharge status: Home / Self Care | Payer: Medicare Other | Source: Ambulatory Visit

## 2022-05-19 HISTORY — DX: Unspecified cataract: H26.9

## 2022-05-20 ENCOUNTER — Other Ambulatory Visit: Payer: Self-pay | Admitting: Internal Medicine

## 2022-05-20 ENCOUNTER — Encounter (HOSPITAL_COMMUNITY): Payer: Self-pay | Admitting: Physician Assistant

## 2022-05-20 NOTE — Progress Notes (Signed)
Anesthesia Chart Review   Case: [id number] Date/Time: 05/22/22 1100   Procedure: LAPAROSCOPIC CHOLECYSTECTOMY WITH INTRAOPERATIVE CHOLANGIOGRAM   Anesthesia type: General   Pre-op diagnosis: SYMPTOMATIC CHOLELITHIASIS   Location: [facility] ROOM 01 / [facility]   Surgeons: Armandina Gemma, MD       DISCUSSION:76 y.o. never smoker with h/o HTN, a-fib, SIADH, symptomatic cholelithiasis scheduled for above procedure 05/22/2022 with Dr. Armandina Gemma.   Pt seen by cardiology 05/14/2022. Per OV note, "Chart reviewed as part of pre-operative protocol coverage. Given past medical history and time since last visit, based on ACC/AHA guidelines, Katrina Benitez would be at acceptable risk for the planned procedure without further cardiovascular testing.    Patient was advised that if she develops new symptoms prior to surgery to contact our office to arrange a follow-up appointment.  She verbalized understanding.   Her RCRI is a class III risk, 6.6% risk of major cardiac event.  She is able to complete greater than 4 METS of physical activity."  Anticipate pt can proceed with planned procedure barring acute status change.   VS: LMP  (LMP Unknown)   PROVIDERS: Tawnya Crook, MD is PCP   Primary Cardiologist: Sherren Mocha, MD LABS:  labs DOS (all labs ordered are listed, but only abnormal results are displayed)  Labs Reviewed - No data to display   IMAGES:   EKG:   CV: Echo 02/15/2021 1. Left ventricular ejection fraction, by estimation, is 60 to 65%. The  left ventricle has normal function. The left ventricle has no regional  wall motion abnormalities. Left ventricular diastolic parameters were  normal.   2. Right ventricular systolic function is normal. The right ventricular  size is normal. There is normal pulmonary artery systolic pressure.   3. The mitral valve is normal in structure. Trivial mitral valve  regurgitation. No evidence of mitral stenosis.   4. The aortic valve is tricuspid.  Aortic valve regurgitation is mild.  Mild aortic valve sclerosis is present, with no evidence of aortic valve  stenosis.   5. Aortic dilatation noted. There is mild dilatation of the ascending  aorta, measuring 40 mm.   6. The inferior vena cava is normal in size with greater than 50%  respiratory variability, suggesting right atrial pressure of 3 mmHg.  Past Medical History:  Diagnosis Date   Allergy    Anemia    Anxiety    Arthritis    degenerative disc disorder   Cancer ([place])    skin cancer   Cardiac arrhythmia    Cataract    surgery in right eye in 03/2022   Depression    Diverticulitis    Diverticulosis    GERD (gastroesophageal reflux disease)    Heart murmur    HTN (hypertension)    not on meds in 2 years per pt on 05/19/22 n   Infertility, female    Neuromuscular disorder ([place])    idiopathic peripheral  neuropathy   Peripheral neuropathy    Positive PPD    SIADH (syndrome of inappropriate ADH production) ([place])     Past Surgical History:  Procedure Laterality Date   BREAST BIOPSY Left    BREAST EXCISIONAL BIOPSY Left    benign   CERVICAL BIOPSY  W/ LOOP ELECTRODE EXCISION     CERVICAL POLYPECTOMY  02/2016   COLONOSCOPY     COLPOSCOPY     DILATION AND CURETTAGE OF UTERUS     HYSTEROSCOPY     LOOP RECORDER IMPLANT  LUMBAR DISC SURGERY     LUMBAR LAMINECTOMY     TONSILLECTOMY     UPPER GASTROINTESTINAL ENDOSCOPY      MEDICATIONS:  Alum Hydroxide-Mag Carbonate (GAVISCON EXTRA STRENGTH) 160-105 MG CHEW   betamethasone valerate ointment (VALISONE) 0.1 %   Cholecalciferol 50 MCG (2000 UT) CAPS   clobetasol (TEMOVATE) 0.05 % external solution   diazepam (VALIUM) 10 MG tablet   diphenoxylate-atropine (LOMOTIL) 2.5-0.025 MG tablet   fluorometholone (FML) 0.1 % ophthalmic suspension   gabapentin (NEURONTIN) 600 MG tablet   hydrOXYzine (VISTARIL) 25 MG capsule   ibuprofen (ADVIL) 200 MG tablet   mirabegron ER (MYRBETRIQ) 50 MG TB24 tablet   ondansetron  (ZOFRAN-ODT) 8 MG disintegrating tablet   Polyethyl Glyc-Propyl Glyc PF (SYSTANE HYDRATION PF) 0.4-0.3 % SOLN   Pramoxine HCl (CERAVE ITCH RELIEF) 1 % CREA   Probiotic Product (ALIGN PO)   sodium chloride 1 g tablet   triamcinolone cream (KENALOG) 0.1 %   VITAMIN D PO   zolpidem (AMBIEN) 10 MG tablet   No current facility-administered medications for this encounter.   Katrina Felix Ward, PA-C [facility] [phone]

## 2022-05-21 ENCOUNTER — Ambulatory Visit: Payer: Medicare Other | Admitting: Family Medicine

## 2022-05-22 ENCOUNTER — Ambulatory Visit (HOSPITAL_COMMUNITY): Admission: RE | Admit: 2022-05-22 | Payer: Medicare Other | Source: Ambulatory Visit | Admitting: Surgery

## 2022-05-22 ENCOUNTER — Encounter (HOSPITAL_COMMUNITY): Admission: RE | Payer: Self-pay | Source: Ambulatory Visit

## 2022-05-22 ENCOUNTER — Encounter: Payer: Self-pay | Admitting: Cardiovascular Disease

## 2022-05-22 DIAGNOSIS — R1011 Right upper quadrant pain: Secondary | ICD-10-CM | POA: Diagnosis present

## 2022-05-22 DIAGNOSIS — Z01818 Encounter for other preprocedural examination: Secondary | ICD-10-CM

## 2022-05-22 DIAGNOSIS — K8020 Calculus of gallbladder without cholecystitis without obstruction: Secondary | ICD-10-CM | POA: Diagnosis present

## 2022-05-22 DIAGNOSIS — I890 Lymphedema, not elsewhere classified: Secondary | ICD-10-CM

## 2022-05-22 HISTORY — DX: Malignant (primary) neoplasm, unspecified: C80.1

## 2022-05-22 SURGERY — LAPAROSCOPIC CHOLECYSTECTOMY WITH INTRAOPERATIVE CHOLANGIOGRAM
Anesthesia: General

## 2022-05-22 NOTE — Telephone Encounter (Signed)
Per last OV note on 02/12/21: Some characteristics of lymphedema with swelling involving the dorsum of the left foot.  Her swelling is mild to moderate.  We discussed physical therapy referral but physical therapy for treatment of non-malignancy related edema/lymphedema in the [facility] system is limited to [facility] and [place].  The patient does not wish to pursue this at this time.  She will continue with leg elevation.   Will route to MD to okay placing referral at this time, as it's been greater than 1 year since time of previous visit.

## 2022-05-24 ENCOUNTER — Other Ambulatory Visit: Payer: Self-pay | Admitting: Family Medicine

## 2022-05-27 ENCOUNTER — Other Ambulatory Visit: Payer: Self-pay

## 2022-05-27 ENCOUNTER — Inpatient Hospital Stay (HOSPITAL_COMMUNITY)
Admission: RE | Admit: 2022-05-27 | Discharge: 2022-05-27 | Disposition: A | Discharge status: Home / Self Care | Payer: Medicare Other | Source: Ambulatory Visit

## 2022-05-27 NOTE — Progress Notes (Signed)
Updated date of surgery: 06/02/22  Updated time of arrival: 0915 AM  Patient will be discharged from hospital and monitored at home for 24 hours by: Jenny Reichmann (husband) [phone]  Patient denies any changes in allergies, medications, medical history since pre op appointment on:05/19/22  Pre op instructions reviewed, follow up questions addressed and patient verbalized understanding at this time.

## 2022-05-27 NOTE — Progress Notes (Signed)
Katrina Benitez Pre surgical testing appointment was made via phone call only.

## 2022-05-27 NOTE — Telephone Encounter (Signed)
Yes this is fine. Please place referral to physical therapy for treatment of lymphedema. Thx.

## 2022-05-30 ENCOUNTER — Other Ambulatory Visit (INDEPENDENT_AMBULATORY_CARE_PROVIDER_SITE_OTHER): Payer: Medicare Other

## 2022-05-30 ENCOUNTER — Encounter (HOSPITAL_COMMUNITY): Payer: Self-pay | Admitting: Surgery

## 2022-05-30 DIAGNOSIS — E222 Syndrome of inappropriate secretion of antidiuretic hormone: Secondary | ICD-10-CM | POA: Diagnosis not present

## 2022-05-30 DIAGNOSIS — M2550 Pain in unspecified joint: Secondary | ICD-10-CM

## 2022-05-30 DIAGNOSIS — Z6831 Body mass index (BMI) 31.0-31.9, adult: Secondary | ICD-10-CM | POA: Diagnosis not present

## 2022-05-30 DIAGNOSIS — Z8639 Personal history of other endocrine, nutritional and metabolic disease: Secondary | ICD-10-CM | POA: Diagnosis not present

## 2022-05-30 DIAGNOSIS — E871 Hypo-osmolality and hyponatremia: Secondary | ICD-10-CM

## 2022-05-30 LAB — BASIC METABOLIC PANEL
BUN: 14 mg/dL (ref 6–23)
CO2: 28 mEq/L (ref 19–32)
Calcium: 8.9 mg/dL (ref 8.4–10.5)
Chloride: 96 mEq/L (ref 96–112)
Creatinine, Ser: 0.93 mg/dL (ref 0.40–1.20)
GFR: 59.88 mL/min — ABNORMAL LOW (ref >60.00)
Glucose, Bld: 86 mg/dL (ref 70–99)
Potassium: 4.6 mEq/L (ref 3.5–5.1)
Sodium: 131 mEq/L — ABNORMAL LOW (ref 135–145)

## 2022-05-30 LAB — VITAMIN D 25 HYDROXY (VIT D DEFICIENCY, FRACTURES): VITD: 55.76 ng/mL (ref 30.00–100.00)

## 2022-05-30 LAB — SEDIMENTATION RATE: Sed Rate: 11 mm/hr (ref 0–30)

## 2022-06-01 ENCOUNTER — Encounter (HOSPITAL_COMMUNITY): Payer: Self-pay | Admitting: Surgery

## 2022-06-02 ENCOUNTER — Ambulatory Visit (HOSPITAL_BASED_OUTPATIENT_CLINIC_OR_DEPARTMENT_OTHER): Payer: Medicare Other | Admitting: Anesthesiology

## 2022-06-02 ENCOUNTER — Other Ambulatory Visit: Payer: Self-pay

## 2022-06-02 ENCOUNTER — Ambulatory Visit (HOSPITAL_COMMUNITY): Payer: Medicare Other

## 2022-06-02 ENCOUNTER — Encounter (HOSPITAL_COMMUNITY): Payer: Self-pay | Admitting: Surgery

## 2022-06-02 ENCOUNTER — Encounter (HOSPITAL_COMMUNITY)
Admission: RE | Disposition: A | Discharge status: Home / Self Care | Payer: Self-pay | Source: Ambulatory Visit | Attending: Surgery

## 2022-06-02 ENCOUNTER — Ambulatory Visit (HOSPITAL_COMMUNITY): Payer: Medicare Other | Admitting: Anesthesiology

## 2022-06-02 ENCOUNTER — Ambulatory Visit (HOSPITAL_COMMUNITY)
Admission: RE | Admit: 2022-06-02 | Discharge: 2022-06-02 | Disposition: A | Discharge status: Home / Self Care | Payer: Medicare Other | Source: Ambulatory Visit | Attending: Surgery | Admitting: Surgery

## 2022-06-02 DIAGNOSIS — K8020 Calculus of gallbladder without cholecystitis without obstruction: Secondary | ICD-10-CM | POA: Diagnosis not present

## 2022-06-02 DIAGNOSIS — I10 Essential (primary) hypertension: Secondary | ICD-10-CM | POA: Insufficient documentation

## 2022-06-02 DIAGNOSIS — K8064 Calculus of gallbladder and bile duct with chronic cholecystitis without obstruction: Secondary | ICD-10-CM

## 2022-06-02 DIAGNOSIS — K8010 Calculus of gallbladder with chronic cholecystitis without obstruction: Secondary | ICD-10-CM | POA: Diagnosis not present

## 2022-06-02 DIAGNOSIS — I4891 Unspecified atrial fibrillation: Secondary | ICD-10-CM | POA: Diagnosis not present

## 2022-06-02 DIAGNOSIS — E1151 Type 2 diabetes mellitus with diabetic peripheral angiopathy without gangrene: Secondary | ICD-10-CM | POA: Diagnosis not present

## 2022-06-02 DIAGNOSIS — F418 Other specified anxiety disorders: Secondary | ICD-10-CM | POA: Diagnosis not present

## 2022-06-02 DIAGNOSIS — K219 Gastro-esophageal reflux disease without esophagitis: Secondary | ICD-10-CM | POA: Diagnosis not present

## 2022-06-02 DIAGNOSIS — K8060 Calculus of gallbladder and bile duct with cholecystitis, unspecified, without obstruction: Secondary | ICD-10-CM | POA: Diagnosis not present

## 2022-06-02 HISTORY — PX: CHOLECYSTECTOMY: SHX55

## 2022-06-02 LAB — CBC WITH DIFFERENTIAL/PLATELET
Abs Immature Granulocytes: 0 10*3/uL (ref 0.00–0.07)
Basophils Absolute: 0 10*3/uL (ref 0.0–0.1)
Basophils Relative: 1 %
Eosinophils Absolute: 0.2 10*3/uL (ref 0.0–0.5)
Eosinophils Relative: 5 %
HCT: 32.3 % — ABNORMAL LOW (ref 36.0–46.0)
Hemoglobin: 11.1 g/dL — ABNORMAL LOW (ref 12.0–15.0)
Immature Granulocytes: 0 %
Lymphocytes Relative: 26 %
Lymphs Abs: 1 10*3/uL (ref 0.7–4.0)
MCH: 27.4 pg (ref 26.0–34.0)
MCHC: 34.4 g/dL (ref 30.0–36.0)
MCV: 79.8 fL — ABNORMAL LOW (ref 80.0–100.0)
Monocytes Absolute: 0.6 10*3/uL (ref 0.1–1.0)
Monocytes Relative: 16 %
Neutro Abs: 1.9 10*3/uL (ref 1.7–7.7)
Neutrophils Relative %: 52 %
Platelets: 256 10*3/uL (ref 150–400)
RBC: 4.05 MIL/uL (ref 3.87–5.11)
RDW: 15.4 % (ref 11.5–15.5)
WBC: 3.7 10*3/uL — ABNORMAL LOW (ref 4.0–10.5)
nRBC: 0 % (ref 0.0–0.2)

## 2022-06-02 LAB — COMPREHENSIVE METABOLIC PANEL
ALT: 18 U/L (ref 0–44)
AST: 19 U/L (ref 15–41)
Albumin: 3.8 g/dL (ref 3.5–5.0)
Alkaline Phosphatase: 134 U/L — ABNORMAL HIGH (ref 38–126)
Anion gap: 5 (ref 5–15)
BUN: 15 mg/dL (ref 8–23)
CO2: 28 mmol/L (ref 22–32)
Calcium: 9.3 mg/dL (ref 8.9–10.3)
Chloride: 106 mmol/L (ref 98–111)
Creatinine, Ser: 1.03 mg/dL — ABNORMAL HIGH (ref 0.44–1.00)
GFR, Estimated: 56 mL/min — ABNORMAL LOW (ref >60)
Glucose, Bld: 101 mg/dL — ABNORMAL HIGH (ref 70–99)
Potassium: 5.3 mmol/L — ABNORMAL HIGH (ref 3.5–5.1)
Sodium: 139 mmol/L (ref 135–145)
Total Bilirubin: 0.5 mg/dL (ref 0.3–1.2)
Total Protein: 6.9 g/dL (ref 6.5–8.1)

## 2022-06-02 SURGERY — LAPAROSCOPIC CHOLECYSTECTOMY WITH INTRAOPERATIVE CHOLANGIOGRAM
Anesthesia: General

## 2022-06-02 MED ORDER — CHLORHEXIDINE GLUCONATE CLOTH 2 % EX PADS: 6.0000 | MEDICATED_PAD | Freq: Once | CUTANEOUS | Status: DC

## 2022-06-02 MED ORDER — IOHEXOL 300 MG/ML  SOLN
INTRAMUSCULAR | Status: DC | PRN
  Administered 2022-06-02: 10 mL

## 2022-06-02 MED ORDER — LACTATED RINGERS IR SOLN
Status: DC | PRN
  Administered 2022-06-02: 1000 mL

## 2022-06-02 MED ORDER — LACTATED RINGERS IV SOLN: INTRAVENOUS | Status: DC

## 2022-06-02 MED ORDER — CHLORHEXIDINE GLUCONATE 0.12 % MT SOLN
15.0000 mL | Freq: Once | OROMUCOSAL | Status: AC
  Administered 2022-06-02: 15 mL via OROMUCOSAL

## 2022-06-02 MED ORDER — BUPIVACAINE-EPINEPHRINE (PF) 0.5% -1:200000 IJ SOLN
INTRAMUSCULAR | Status: AC
  Filled 2022-06-02: qty 30

## 2022-06-02 MED ORDER — MIDAZOLAM HCL 5 MG/5ML IJ SOLN
INTRAMUSCULAR | Status: DC | PRN
  Administered 2022-06-02: 2 mg via INTRAVENOUS

## 2022-06-02 MED ORDER — ROCURONIUM BROMIDE 10 MG/ML (PF) SYRINGE
PREFILLED_SYRINGE | INTRAVENOUS | Status: DC | PRN
  Administered 2022-06-02: 50 mg via INTRAVENOUS

## 2022-06-02 MED ORDER — DIPHENHYDRAMINE HCL 50 MG/ML IJ SOLN
12.5000 mg | Freq: Once | INTRAMUSCULAR | Status: AC
  Administered 2022-06-02: 12.5 mg via INTRAVENOUS

## 2022-06-02 MED ORDER — FENTANYL CITRATE PF 50 MCG/ML IJ SOSY
25.0000 ug | PREFILLED_SYRINGE | INTRAMUSCULAR | Status: DC | PRN
  Administered 2022-06-02: 50 ug via INTRAVENOUS

## 2022-06-02 MED ORDER — TRAMADOL HCL 50 MG PO TABS: 50.0000 mg | ORAL_TABLET | Freq: Four times a day (QID) | ORAL | 0 refills | Status: DC | PRN

## 2022-06-02 MED ORDER — ONDANSETRON HCL 4 MG/2ML IJ SOLN
INTRAMUSCULAR | Status: AC
  Filled 2022-06-02: qty 2

## 2022-06-02 MED ORDER — FENTANYL CITRATE (PF) 100 MCG/2ML IJ SOLN
INTRAMUSCULAR | Status: AC
  Filled 2022-06-02: qty 2

## 2022-06-02 MED ORDER — 0.9 % SODIUM CHLORIDE (POUR BTL) OPTIME
TOPICAL | Status: DC | PRN
  Administered 2022-06-02: 1000 mL

## 2022-06-02 MED ORDER — LIDOCAINE 2% (20 MG/ML) 5 ML SYRINGE
INTRAMUSCULAR | Status: DC | PRN
  Administered 2022-06-02: 80 mg via INTRAVENOUS

## 2022-06-02 MED ORDER — MIDAZOLAM HCL 2 MG/2ML IJ SOLN
INTRAMUSCULAR | Status: AC
  Filled 2022-06-02: qty 2

## 2022-06-02 MED ORDER — PROPOFOL 10 MG/ML IV BOLUS
INTRAVENOUS | Status: DC | PRN
  Administered 2022-06-02: 120 mg via INTRAVENOUS

## 2022-06-02 MED ORDER — ONDANSETRON HCL 4 MG/2ML IJ SOLN
4.0000 mg | Freq: Once | INTRAMUSCULAR | Status: AC | PRN
  Administered 2022-06-02: 4 mg via INTRAVENOUS

## 2022-06-02 MED ORDER — FENTANYL CITRATE PF 50 MCG/ML IJ SOSY
PREFILLED_SYRINGE | INTRAMUSCULAR | Status: AC
  Filled 2022-06-02: qty 1

## 2022-06-02 MED ORDER — ONDANSETRON HCL 4 MG/2ML IJ SOLN
INTRAMUSCULAR | Status: DC | PRN
  Administered 2022-06-02: 4 mg via INTRAVENOUS

## 2022-06-02 MED ORDER — AMISULPRIDE (ANTIEMETIC) 5 MG/2ML IV SOLN
10.0000 mg | Freq: Once | INTRAVENOUS | Status: AC | PRN
Start: 2022-06-02 — End: 2022-06-02
  Administered 2022-06-02: 10 mg via INTRAVENOUS

## 2022-06-02 MED ORDER — CEFAZOLIN SODIUM-DEXTROSE 2-4 GM/100ML-% IV SOLN
2.0000 g | INTRAVENOUS | Status: AC
  Administered 2022-06-02: 2 g via INTRAVENOUS

## 2022-06-02 MED ORDER — OXYCODONE HCL 5 MG/5ML PO SOLN: 5.0000 mg | Freq: Once | ORAL | Status: AC | PRN

## 2022-06-02 MED ORDER — DEXAMETHASONE SODIUM PHOSPHATE 10 MG/ML IJ SOLN
INTRAMUSCULAR | Status: DC | PRN
  Administered 2022-06-02: 6 mg via INTRAVENOUS

## 2022-06-02 MED ORDER — OXYCODONE HCL 5 MG PO TABS
ORAL_TABLET | ORAL | Status: AC
  Filled 2022-06-02: qty 1

## 2022-06-02 MED ORDER — AMISULPRIDE (ANTIEMETIC) 5 MG/2ML IV SOLN
INTRAVENOUS | Status: AC
  Filled 2022-06-02: qty 4

## 2022-06-02 MED ORDER — DIPHENHYDRAMINE HCL 50 MG/ML IJ SOLN
INTRAMUSCULAR | Status: AC
  Filled 2022-06-02: qty 1

## 2022-06-02 MED ORDER — SUGAMMADEX SODIUM 200 MG/2ML IV SOLN
INTRAVENOUS | Status: DC | PRN
  Administered 2022-06-02: 195 mg via INTRAVENOUS

## 2022-06-02 MED ORDER — FENTANYL CITRATE (PF) 100 MCG/2ML IJ SOLN
INTRAMUSCULAR | Status: DC | PRN
  Administered 2022-06-02: 50 ug via INTRAVENOUS
  Administered 2022-06-02: 100 ug via INTRAVENOUS

## 2022-06-02 MED ORDER — BUPIVACAINE-EPINEPHRINE 0.5% -1:200000 IJ SOLN
INTRAMUSCULAR | Status: DC | PRN
  Administered 2022-06-02: 30 mL

## 2022-06-02 MED ORDER — OXYCODONE HCL 5 MG PO TABS
5.0000 mg | ORAL_TABLET | Freq: Once | ORAL | Status: AC | PRN
  Administered 2022-06-02: 5 mg via ORAL

## 2022-06-02 SURGICAL SUPPLY — 38 items
APPLIER CLIP ROT 10 11.4 M/L (STAPLE) ×1
BAG COUNTER SPONGE SURGICOUNT (BAG) IMPLANT
CABLE HIGH FREQUENCY MONO STRZ (ELECTRODE) ×1 IMPLANT
CHLORAPREP W/TINT 26 (MISCELLANEOUS) ×2 IMPLANT
CLIP APPLIE ROT 10 11.4 M/L (STAPLE) ×1 IMPLANT
COVER MAYO STAND XLG (MISCELLANEOUS) ×1 IMPLANT
COVER SURGICAL LIGHT HANDLE (MISCELLANEOUS) ×1 IMPLANT
DERMABOND ADVANCED .7 DNX12 (GAUZE/BANDAGES/DRESSINGS) IMPLANT
DRAPE C-ARM 42X120 X-RAY (DRAPES) ×1 IMPLANT
ELECT REM PT RETURN 15FT ADLT (MISCELLANEOUS) ×1 IMPLANT
GAUZE SPONGE 2X2 8PLY STRL LF (GAUZE/BANDAGES/DRESSINGS) ×1 IMPLANT
GLOVE SURG ORTHO 8.0 STRL STRW (GLOVE) ×1 IMPLANT
GLOVE SURG SYN 7.5  E (GLOVE) ×2
GLOVE SURG SYN 7.5 E (GLOVE) ×2 IMPLANT
GLOVE SURG SYN 7.5 PF PI (GLOVE) ×2 IMPLANT
GOWN STRL REUS W/ TWL XL LVL3 (GOWN DISPOSABLE) ×2 IMPLANT
GOWN STRL REUS W/TWL XL LVL3 (GOWN DISPOSABLE) ×2
HEMOSTAT SURGICEL 4X8 (HEMOSTASIS) IMPLANT
IRRIG SUCT STRYKERFLOW 2 WTIP (MISCELLANEOUS) ×1
IRRIGATION SUCT STRKRFLW 2 WTP (MISCELLANEOUS) ×1 IMPLANT
KIT BASIN OR (CUSTOM PROCEDURE TRAY) ×1 IMPLANT
KIT TURNOVER KIT A (KITS) IMPLANT
PENCIL SMOKE EVACUATOR (MISCELLANEOUS) IMPLANT
SCISSORS LAP 5X35 DISP (ENDOMECHANICALS) ×1 IMPLANT
SET CHOLANGIOGRAPH MIX (MISCELLANEOUS) ×1 IMPLANT
SET TUBE SMOKE EVAC HIGH FLOW (TUBING) IMPLANT
SLEEVE Z-THREAD 5X100MM (TROCAR) ×1 IMPLANT
SPIKE FLUID TRANSFER (MISCELLANEOUS) ×1 IMPLANT
STRIP CLOSURE SKIN 1/2X4 (GAUZE/BANDAGES/DRESSINGS) IMPLANT
SUT MNCRL AB 4-0 PS2 18 (SUTURE) ×1 IMPLANT
SYS BAG RETRIEVAL 10MM (BASKET) ×1
SYSTEM BAG RETRIEVAL 10MM (BASKET) ×1 IMPLANT
TOWEL OR 17X26 10 PK STRL BLUE (TOWEL DISPOSABLE) ×1 IMPLANT
TOWEL OR NON WOVEN STRL DISP B (DISPOSABLE) ×1 IMPLANT
TRAY LAPAROSCOPIC (CUSTOM PROCEDURE TRAY) ×1 IMPLANT
TROCAR 11X100 Z THREAD (TROCAR) ×1 IMPLANT
TROCAR BALLN 12MMX100 BLUNT (TROCAR) ×1 IMPLANT
TROCAR Z-THREAD OPTICAL 5X100M (TROCAR) ×1 IMPLANT

## 2022-06-02 NOTE — Interval H&P Note (Signed)
History and Physical Interval Note:  06/02/2022 10:55 AM  Katrina Benitez  has presented today for surgery, with the diagnosis of SYMPTOMATIC CHOLELITHIASIS.  The various methods of treatment have been discussed with the patient and family. After consideration of risks, benefits and other options for treatment, the patient has consented to    Procedure(s): LAPAROSCOPIC CHOLECYSTECTOMY WITH INTRAOPERATIVE CHOLANGIOGRAM (N/A) as a surgical intervention.    The patient's history has been reviewed, patient examined, no change in status, stable for surgery.  I have reviewed the patient's chart and labs.  Questions were answered to the patient's satisfaction.    Armandina Gemma, [facility] [place] practice Office: [place]

## 2022-06-02 NOTE — Progress Notes (Signed)
Upon admission and review of patient chart, note found from PAT that patient reported she couldn't use dial soap for preop wash d/t frequent rashes/breakouts she gets from various products. Note stated "patient to wash with dove soap d/t sensitivties" Patient states it "wasn't an allergy exactly just very sensitive to products." Preop check list to reflect no CHG. Discussed with Dr. Harlow Asa and OR nurse Joelene Millin this situation. Per Dr. Harlow Asa, do not use wipes to avoid breakout, he decided to use duraprep and communicated to OR nurse.

## 2022-06-02 NOTE — Discharge Instructions (Addendum)
[facility]  LAPAROSCOPIC SURGERY:  POST-OP INSTRUCTIONS  Always review your discharge instruction sheet given to you by the facility where your surgery was performed.  A prescription for pain medication may be given to you upon discharge.  Take your pain medication as prescribed.  If narcotic pain medicine is not needed, then you may take acetaminophen (Tylenol) or ibuprofen (Advil) as needed.  Take your usually prescribed medications unless otherwise directed.  If you need a refill on your pain medication, please contact your pharmacy.  They will contact our office to request authorization. Prescriptions will not be filled after 5 P.M. or on weekends.  You should follow a light diet the first few days after arrival home, such as soup and crackers or toast.  Be sure to include plenty of fluids daily.  Most patients will experience some swelling and bruising in the area of the incisions.  Ice packs will help.  Swelling and bruising can take several days to resolve.   It is common to experience some constipation after surgery.  Increasing fluid intake and taking a stool softener (such as Colace) will usually help or prevent this problem from occurring.  A mild laxative (Milk of Magnesia or Miralax) should be taken according to package instructions if there has been no bowel movement after 48 hours.  You will likely have Dermabond (topical glue) over your incisions.  This seals the incisions and allows you to bathe and shower at any time after your surgery.  Glue should remain in place for up to 10 days.  It may be removed after 10 days by pealing off the Dermabond material or using Vaseline or naval jelly to remove.  If you have steri-strips over your incisions, you may remove the gauze bandage on the second day after surgery, and you may shower at that time.  Leave your steri-strips (small skin tapes) in place directly over the incision.  These strips should remain on the  skin for 5-7 days and then be removed.  You may get them wet in the shower and pat them dry.  Any sutures or staples will be removed at the office during your follow-up visit.  ACTIVITIES:  You may resume regular (light) daily activities beginning the next day - such as daily self-care, walking, climbing stairs - gradually increasing activities as tolerated.  You may have sexual intercourse when it is comfortable.  Refrain from any heavy lifting or straining until approved by your doctor.  You may drive when you are no longer taking prescription pain medication, when you can comfortably wear a seatbelt, and when you can safely maneuver your car and apply brakes.  You should see your doctor in the office for a follow-up appointment approximately 2-3 weeks after your surgery.  Make sure that you call for this appointment within a day or two after you arrive home to insure a convenient appointment time.  WHEN TO CALL YOUR DOCTOR: Fever over 101.0 Inability to urinate Continued bleeding from incision Increased pain, redness, or drainage from the incision Increasing abdominal pain  The clinic staff is available to answer your questions during regular business hours.  Please don't hesitate to call and ask to speak to one of the nurses for clinical concerns.  If you have a medical emergency, go to the nearest emergency room or call 911.  A surgeon from [facility] is always on call for the hospital.  Todd Gerkin, MD [facility] Office: [phone] Toll Free:    [phone] FAX [phone]  Website: [email or web address] 

## 2022-06-02 NOTE — Transfer of Care (Signed)
Immediate Anesthesia Transfer of Care Note  Patient: Olean Ree Hakimian  Procedure(s) Performed: Procedure(s): LAPAROSCOPIC CHOLECYSTECTOMY WITH INTRAOPERATIVE CHOLANGIOGRAM (N/A)  Patient Location: PACU  Anesthesia Type:General  Level of Consciousness:  sedated, patient cooperative and responds to stimulation  Airway & Oxygen Therapy:Patient Spontanous Breathing and Patient connected to face mask oxgen  Post-op Assessment:  Report given to PACU RN and Post -op Vital signs reviewed and stable  Post vital signs:  Reviewed and stable  Last Vitals:  Vitals:   06/02/22 0927 06/02/22 1254  BP: (!) 149/77 (!) 140/59  Pulse:  65  Resp: 16 16  Temp: 36.7 C 36.5 C  SpO2: 58% 099%    Complications: No apparent anesthesia complications

## 2022-06-02 NOTE — Op Note (Signed)
Procedure Note  Pre-operative Diagnosis:  Chronic cholecystitis, cholelithiasis, biliary colic  Post-operative Diagnosis:  same  Surgeon:  Armandina Gemma, MD  Assistant:  Malachi Pro, PA-C   Procedure:  Laparoscopic cholecystectomy with intra-operative cholangiography  Anesthesia:  General  Estimated Blood Loss:  minimal  Drains: none         Specimen: gallbladder to pathology  Indications:  Patient is referred by Dr. Scarlette Shorts for surgical evaluation and management of symptomatic cholelithiasis. Patient notes a longstanding history of intermittent emesis without explanation. Over the past 8 months the patient has developed intermittent abdominal pain associated with nausea and emesis. Episodes may last from 1 to 2 days. Patient does not note any particularly causative actions or food consumption. Patient denies fevers or chills. She denies jaundice or acholic stools. There is no family history of gallbladder disease. Patient underwent abdominal ultrasound in April 2023 which showed multiple shadowing gallstones with layering. There was no wall thickening or signs of inflammation. Patient underwent nuclear medicine hepatobiliary scan on April 01, 2022. This was a normal study with an ejection fraction of 60%. There was no evidence of obstruction. Previous abdominal surgery includes laparoscopy for infertility evaluation. Otherwise she has had no prior abdominal surgery. Patient is a retired Surveyor, quantity.  Procedure description: The patient was seen in the pre-op holding area. The risks, benefits, complications, treatment options, and expected outcomes were previously discussed with the patient. The patient agreed with the proposed plan and has signed the informed consent form.  The patient was transported to operating room #3 at the [facility]. The patient was placed in the supine position on the operating room table. Following induction of general anesthesia, the abdomen was  prepped and draped in the usual aseptic fashion.  An incision was made in the skin near the umbilicus. The midline fascia was incised and the peritoneal cavity was entered and a Hasson cannula was introduced under direct vision. The cannula was secured with a 0-Vicryl pursestring suture. Pneumoperitoneum was established with carbon dioxide. Additional cannulae were introduced under direct vision along the right costal margin in the midline, mid-clavicular line, and anterior axillary line.   The gallbladder was identified and the fundus grasped and retracted cephalad. Adhesions were taken down bluntly and the electrocautery was utilized as needed, taking care not to involve any adjacent structures. The infundibulum was grasped and retracted laterally, exposing the peritoneum overlying the triangle of Calot. The peritoneum was incised and structures exposed with blunt dissection. The cystic duct was clearly identified, bluntly dissected circumferentially, and clipped at the neck of the gallbladder.  An incision was made in the cystic duct and the cholangiogram catheter introduced. The catheter was secured using an ligaclip.  Real-time cholangiography was performed using C-arm fluoroscopy.  There was rapid filling of a normal caliber common bile duct.  There was reflux of contrast into the left and right hepatic ductal systems.  There was free flow distally into the duodenum without filling defect or obstruction.  The catheter was removed from the peritoneal cavity.  The cystic duct was then ligated with ligaclips and divided. The cystic artery was identified, dissected circumferentially, ligated with ligaclips, and divided.  The gallbladder was dissected away from the gallbladder bed using the electrocautery for hemostasis. The gallbladder was completely removed from the liver and placed into an endocatch bag. The gallbladder was removed in the endocatch bag through the umbilical port site and submitted to  pathology for review.  The right upper quadrant was  irrigated and the gallbladder bed was inspected. Hemostasis was achieved with the electrocautery.  Cannulae were removed under direct vision and good hemostasis was noted. Pneumoperitoneum was released and the majority of the carbon dioxide evacuated. The umbilical wound was irrigated and the fascia was then closed with the pursestring suture.  Local anesthetic was infiltrated at all port sites. Skin incisions were closed with 4-0 Monocril subcuticular sutures and Dermabond was applied.  Instrument, sponge, and needle counts were correct at the conclusion of the case.  The patient was awakened from anesthesia and brought to the recovery room in stable condition.  The patient tolerated the procedure well.   Armandina Gemma, [facility] Office: [phone]

## 2022-06-02 NOTE — Anesthesia Procedure Notes (Signed)
Procedure Name: Intubation Date/Time: 06/02/2022 12:00 PM  Performed by: Lavina Hamman, CRNAPre-anesthesia Checklist: Patient identified, Emergency Drugs available, Suction available, Patient being monitored and Timeout performed Patient Re-evaluated:Patient Re-evaluated prior to induction Oxygen Delivery Method: Circle system utilized Preoxygenation: Pre-oxygenation with 100% oxygen Induction Type: IV induction Ventilation: Mask ventilation without difficulty Laryngoscope Size: Mac and 3 Grade View: Grade II Tube type: Oral Tube size: 7.0 mm Number of attempts: 1 Airway Equipment and Method: Stylet Placement Confirmation: ETT inserted through vocal cords under direct vision, positive ETCO2, CO2 detector and breath sounds checked- equal and bilateral Secured at: 21 cm Tube secured with: Tape Dental Injury: Teeth and Oropharynx as per pre-operative assessment  Comments: ATOI

## 2022-06-02 NOTE — Anesthesia Preprocedure Evaluation (Addendum)
Anesthesia Evaluation  Patient identified by MRN, date of birth, ID band Patient awake    Reviewed: Allergy & Precautions, NPO status , Patient's Chart, lab work & pertinent test results  Airway Mallampati: II  TM Distance: >3 FB Neck ROM: Full    Dental no notable dental hx. (+) Dental Advisory Given, Teeth Intact, Caps   Pulmonary neg pulmonary ROS,    Pulmonary exam normal breath sounds clear to auscultation       Cardiovascular hypertension, Normal cardiovascular exam+ dysrhythmias Atrial Fibrillation + Valvular Problems/Murmurs  Rhythm:Regular Rate:Normal  Not on any Rx x 2 years  Loop recorder  insertion   Neuro/Psych PSYCHIATRIC DISORDERS Anxiety Depression Peripheral neuropathy  Neuromuscular disease    GI/Hepatic Neg liver ROS, GERD  ,Hx/o diverticulosis   Endo/Other  Hx/o SIADH  Renal/GU negative Renal ROS  negative genitourinary   Musculoskeletal  (+) Arthritis , Osteoarthritis,    Abdominal   Peds  Hematology  (+) Blood dyscrasia, anemia ,   Anesthesia Other Findings   Reproductive/Obstetrics                            Anesthesia Physical Anesthesia Plan  ASA: 3  Anesthesia Plan: General   Post-op Pain Management: Minimal or no pain anticipated   Induction: Intravenous and Cricoid pressure planned  PONV Risk Score and Plan: 4 or greater and Treatment may vary due to age or medical condition, Ondansetron and Dexamethasone  Airway Management Planned: Oral ETT  Additional Equipment: None  Intra-op Plan:   Post-operative Plan:   Informed Consent: I have reviewed the patients History and Physical, chart, labs and discussed the procedure including the risks, benefits and alternatives for the proposed anesthesia with the patient or authorized representative who has indicated his/her understanding and acceptance.     Dental advisory given  Plan Discussed with: CRNA  and Anesthesiologist  Anesthesia Plan Comments:         Anesthesia Quick Evaluation

## 2022-06-02 NOTE — Anesthesia Postprocedure Evaluation (Signed)
Anesthesia Post Note  Patient: Katrina Benitez  Procedure(s) Performed: LAPAROSCOPIC CHOLECYSTECTOMY WITH INTRAOPERATIVE CHOLANGIOGRAM     Patient location during evaluation: PACU Anesthesia Type: General Level of consciousness: sedated and patient cooperative Pain management: pain level controlled Vital Signs Assessment: post-procedure vital signs reviewed and stable Respiratory status: spontaneous breathing Cardiovascular status: stable Anesthetic complications: no   No notable events documented.  Last Vitals:  Vitals:   06/02/22 1345 06/02/22 1405  BP: (!) 152/55 (!) 157/76  Pulse: 65 64  Resp: 13   Temp: 36.4 C   SpO2: 98% 100%    Last Pain:  Vitals:   06/02/22 1405  TempSrc:   PainSc: [place]

## 2022-06-03 ENCOUNTER — Encounter (HOSPITAL_COMMUNITY): Payer: Self-pay | Admitting: Surgery

## 2022-06-03 ENCOUNTER — Ambulatory Visit: Payer: Medicare Other | Admitting: Family Medicine

## 2022-06-03 LAB — SURGICAL PATHOLOGY

## 2022-06-09 NOTE — Progress Notes (Signed)
Carelink Summary Report / Loop Recorder 

## 2022-06-11 ENCOUNTER — Ambulatory Visit: Payer: Medicare Other | Attending: Internal Medicine | Admitting: Internal Medicine

## 2022-06-11 ENCOUNTER — Encounter: Payer: Self-pay | Admitting: Internal Medicine

## 2022-06-11 VITALS — BP 126/70 | HR 90 | Ht 65.5 in

## 2022-06-11 DIAGNOSIS — I480 Paroxysmal atrial fibrillation: Secondary | ICD-10-CM | POA: Insufficient documentation

## 2022-06-11 DIAGNOSIS — R55 Syncope and collapse: Secondary | ICD-10-CM | POA: Diagnosis not present

## 2022-06-11 NOTE — Progress Notes (Signed)
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 She is a pleasant 76 yo woman with a h/o atrial fib when she developed Covid pneumonia, about 3 years ago. She has palpitations and a h/o syncope. She denies chest pain or sob. She is not a good candidate for systemic anti-coagulation. I discussed her case with Dr. Burt Knack. She underwent ILR insertion and returns for followup. She has minimal symptoms in atrial fib. Rare palpitations. She has undergone Lap chole.  Allergies  Allergen Reactions   Flagyl [Metronidazole] Hives   Iodixanol Swelling and Other (See Comments)    Throat swelling   Fentanyl Itching   Cipro [Ciprofloxacin Hcl] Hives   Dilaudid [Hydromorphone Hcl] Hives     Current Outpatient Medications  Medication Sig Dispense Refill   acetaminophen (TYLENOL) 500 MG tablet Take 1,000 mg by mouth every 6 (six) hours as needed for mild pain or moderate pain.     Alum Hydroxide-Mag Carbonate (GAVISCON EXTRA STRENGTH) 160-105 MG CHEW Chew 2 tablets by mouth daily as needed (upset stomach).     betamethasone valerate ointment (VALISONE) 0.1 % Apply 1 Application topically 2 (two) times daily as needed (itching).     Cholecalciferol 50 MCG (2000 UT) CAPS Take 4,000 Units by mouth daily.     diazepam (VALIUM) 10 MG tablet TAKE ONE TABLET EVERY 12 HOURS AS NEEDED FOR ANXIETY (Patient taking differently: Take 10 mg by mouth every 12 (twelve) hours as needed for anxiety.) 60 tablet 5   gabapentin (NEURONTIN) 600 MG tablet TAKE ONE TABLET THREE TIMES DAILY 270 tablet 1   hydrOXYzine (VISTARIL) 25 MG capsule Take 2 capsules (50 mg total) by mouth every 6 (six) hours. 720 capsule 0   ibuprofen (ADVIL) 200 MG tablet Take 400 mg by mouth every 6 (six) hours as needed for moderate pain.     mirabegron ER (MYRBETRIQ) 50 MG TB24 tablet Take 1 tablet (50 mg total) by mouth daily. One po qd (Patient taking differently: Take 50 mg by mouth daily.) 30 tablet 2   omeprazole (PRILOSEC) 40 MG capsule TAKE ONE  CAPSULE EACH DAY (Patient taking differently: Take 40 mg by mouth daily.) 90 capsule 1   ondansetron (ZOFRAN-ODT) 8 MG disintegrating tablet Take 1 tablet (8 mg total) by mouth every 8 (eight) hours as needed for nausea or vomiting. 20 tablet 1   Polyethyl Glyc-Propyl Glyc PF (SYSTANE HYDRATION PF) 0.4-0.3 % SOLN Place 1 drop into both eyes daily as needed (dry eyes).     Pramoxine HCl (CERAVE ITCH RELIEF) 1 % CREA Apply 1 Application topically daily as needed (itching).     Probiotic Product (ALIGN PO) Take 1 capsule by mouth daily.     Pumpkin Seed-Soy Germ (AZO BLADDER CONTROL/GO-LESS PO) Take 1 capsule by mouth in the morning, at noon, and at bedtime.     sodium chloride 1 g tablet Take 1 g by mouth daily.     traMADol (ULTRAM) 50 MG tablet Take 1-2 tablets (50-100 mg total) by mouth every 6 (six) hours as needed for moderate pain. 15 tablet 0   triamcinolone cream (KENALOG) 0.1 % Apply 1 Application topically as needed (itching).     zolpidem (AMBIEN) 10 MG tablet TAKE ONE TABLET BY MOUTH AT BEDTIME AS NEEDED FOR SLEEP (Patient taking differently: Take 10 mg by mouth at bedtime as needed for sleep. for sleep) 30 tablet 0   No current facility-administered medications for this visit.     Past Medical History:  Diagnosis  Date   Allergy    Anemia    Anxiety    Arthritis    degenerative disc disorder   Cancer ([place])    skin cancer   Cardiac arrhythmia    Cataract    surgery in right eye in 03/2022   Depression    Diverticulitis    Diverticulosis    GERD (gastroesophageal reflux disease)    Heart murmur    HTN (hypertension)    not on meds in 2 years per pt on 05/19/22 n   Infertility, female    Neuromuscular disorder ([place])    idiopathic peripheral  neuropathy   Peripheral neuropathy    Positive PPD    SIADH (syndrome of inappropriate ADH production) ([place])     ROS:   All systems reviewed and negative except as noted in the HPI.   Past Surgical History:  Procedure  Laterality Date   BREAST BIOPSY Left    BREAST EXCISIONAL BIOPSY Left    benign   CERVICAL BIOPSY  W/ LOOP ELECTRODE EXCISION     CERVICAL POLYPECTOMY  02/2016   CHOLECYSTECTOMY N/A 06/02/2022   Procedure: LAPAROSCOPIC CHOLECYSTECTOMY WITH INTRAOPERATIVE CHOLANGIOGRAM;  Surgeon: Armandina Gemma, MD;  Location: [facility];  Service: General;  Laterality: N/A;   COLONOSCOPY     COLPOSCOPY     DILATION AND CURETTAGE OF UTERUS     HYSTEROSCOPY     LOOP RECORDER IMPLANT     LUMBAR [place] SURGERY     LUMBAR LAMINECTOMY     TONSILLECTOMY     UPPER GASTROINTESTINAL ENDOSCOPY       Family History  Problem Relation Age of Onset   Breast cancer Mother    Glaucoma Mother    Miscarriages / Stillbirths Mother    Heart attack Father    Depression Father    Early death Father    Heart disease Father    Tuberculosis Paternal Grandmother    Prostate cancer Paternal Grandfather    Depression Brother    Hypertension Brother    Colon cancer Neg Hx    Esophageal cancer Neg Hx    Rectal cancer Neg Hx    Stomach cancer Neg Hx      Social History   Socioeconomic History   Marital status: Married    Spouse name: John   Number of children: 0   Years of education: [employer] education level: Not on file  Occupational History   Occupation: retired physician  Tobacco Use   Smoking status: Never   Smokeless tobacco: Never  Vaping Use   Vaping Use: Never used  Substance and Sexual Activity   Alcohol use: Yes    Alcohol/week: 7.0 standard drinks of alcohol    Types: 7 Glasses of wine per week    Comment: 102 glsses wine daily   Drug use: No   Sexual activity: Not Currently    Birth control/protection: Post-menopausal  Other Topics Concern   Not on file  Social History Narrative   Lives at home with her husband.   Right-handed.   1-2 cups caffeine per day.   Retired Stage manager   Social Determinants of Radio broadcast assistant Strain: [place]  (03/11/2022)   Overall Financial  Resource Strain (CARDIA)    Difficulty of Paying Living Expenses: Not hard at all  Food Insecurity: No Food Insecurity (03/11/2022)   Hunger Vital Sign    Worried About Running Out of Food in the Last Year: Never true  Ran Out of Food in the Last Year: Never true  Transportation Needs: No Transportation Needs (03/11/2022)   PRAPARE - Hydrologist (Medical): No    Lack of Transportation (Non-Medical): No  Physical Activity: Inactive (03/11/2022)   Exercise Vital Sign    Days of Exercise per Week: 0 days    Minutes of Exercise per Session: 0 min  Stress: Stress Concern Present (03/11/2022)   [place]    Feeling of Stress : To some extent  Social Connections: Moderately Isolated (03/11/2022)   Social Connection and Isolation Panel [NHANES]    Frequency of Communication with Friends and Family: Three times a week    Frequency of Social Gatherings with Friends and Family: Never    Attends Religious Services: Never    Marine scientist or Organizations: No    Attends Archivist Meetings: Never    Marital Status: Married  Human resources officer Violence: Not At Risk (03/11/2022)   Humiliation, Afraid, Rape, and Kick questionnaire    Fear of Current or Ex-Partner: No    Emotionally Abused: No    Physically Abused: No    Sexually Abused: No     BP 126/70   Pulse 90   Ht 5' 5.5" (1.664 m)   LMP  (LMP Unknown)   SpO2 96%   BMI 29.26 kg/m   Physical Exam:  Well appearing NAD HEENT: Unremarkable Neck:  No JVD, no thyromegally Lymphatics:  No adenopathy Back:  No CVA tenderness Lungs:  Clear with no wheezes HEART:  Regular rate rhythm, no murmurs, no rubs, no clicks Abd:  soft, positive bowel sounds, no organomegally, no rebound, no guarding Ext:  2 plus pulses, no edema, no cyanosis, no clubbing Skin:  No rashes no nodules Neuro:  CN II through XII intact, motor grossly  intact  DEVICE  Normal device function.  See PaceArt for details. Atrial fib is present  Assess/Plan:   PAF - she is out of rhythm about 3 -5% percent of the time including spells lasting 3 hours. I would recommend referral for Watchman and I have discussed with Dr. Burt Knack. Syncope - the etiology is unclear. She undergone ILR insertion. No pauses AI - she is stable at this point.    Cristopher Peru, MD

## 2022-06-11 NOTE — Patient Instructions (Addendum)
Medication Instructions:  Your physician recommends that you continue on your current medications as directed. Please refer to the Current Medication list given to you today.  *If you need a refill on your cardiac medications before your next appointment, please call your pharmacy*  Lab Work: None ordered.  If you have labs (blood work) drawn today and your tests are completely normal, you will receive your results only by: [place] (if you have MyChart) OR A paper copy in the mail If you have any lab test that is abnormal or we need to change your treatment, we will call you to review the results.  Testing/Procedures: None ordered.  Follow-Up: At [facility], you and your health needs are our priority.  As part of our continuing mission to provide you with exceptional heart care, we have created designated Provider Care Teams.  These Care Teams include your primary Cardiologist (physician) and Advanced Practice Providers (APPs -  Physician Assistants and Nurse Practitioners) who all work together to provide you with the care you need, when you need it.  [facility] Telephone number [phone].   We recommend signing up for the patient portal called "MyChart".  Sign up information is provided on this After Visit Summary.  MyChart is used to connect with patients for Virtual Visits (Telemedicine).  Patients are able to view lab/test results, encounter notes, upcoming appointments, etc.  Non-urgent messages can be sent to your provider as well.   To learn more about what you can do with MyChart, go to [email or web address].    Your next appointment:   Please schedule a follow up appointment with Dr. Cristopher Peru in 6 Months.   Please schedule a referral appointment with Dr. Burt Knack for a Watchman implant.     The format for your next appointment:   In Person  Provider:   Cristopher Peru, MD{or one of the following Advanced Practice Providers on your designated Care Team:    Tommye Standard, [place] Legrand Como "Jonni Sanger" Chalmers Cater, [place]   Important Information About Sugar     \

## 2022-06-16 ENCOUNTER — Ambulatory Visit (INDEPENDENT_AMBULATORY_CARE_PROVIDER_SITE_OTHER): Payer: Medicare Other

## 2022-06-16 ENCOUNTER — Encounter: Payer: Self-pay | Admitting: *Deleted

## 2022-06-16 DIAGNOSIS — I480 Paroxysmal atrial fibrillation: Secondary | ICD-10-CM | POA: Diagnosis not present

## 2022-06-17 ENCOUNTER — Encounter (HOSPITAL_BASED_OUTPATIENT_CLINIC_OR_DEPARTMENT_OTHER): Payer: Self-pay | Admitting: Obstetrics & Gynecology

## 2022-06-17 ENCOUNTER — Ambulatory Visit (INDEPENDENT_AMBULATORY_CARE_PROVIDER_SITE_OTHER): Payer: Medicare Other | Admitting: Family Medicine

## 2022-06-17 ENCOUNTER — Encounter: Payer: Self-pay | Admitting: Family Medicine

## 2022-06-17 ENCOUNTER — Telehealth: Payer: Self-pay

## 2022-06-17 VITALS — BP 140/78 | HR 76 | Temp 98.0°F | Ht 67.0 in

## 2022-06-17 DIAGNOSIS — F5101 Primary insomnia: Secondary | ICD-10-CM

## 2022-06-17 DIAGNOSIS — F419 Anxiety disorder, unspecified: Secondary | ICD-10-CM

## 2022-06-17 DIAGNOSIS — Z23 Encounter for immunization: Secondary | ICD-10-CM | POA: Diagnosis not present

## 2022-06-17 LAB — CUP PACEART REMOTE DEVICE CHECK
Date Time Interrogation Session: 20230923221540
Implantable Pulse Generator Implant Date: 20230614

## 2022-06-17 MED ORDER — BUSPIRONE HCL 10 MG PO TABS: 10.0000 mg | ORAL_TABLET | Freq: Two times a day (BID) | ORAL | 1 refills | Status: DC

## 2022-06-17 NOTE — Progress Notes (Signed)
Subjective:     Patient ID: Katrina Benitez, female    DOB: Apr 05, 1946, 76 y.o.   MRN: [id number]  Chief Complaint  Patient presents with   Follow-up    6 month follow-up on moods, overly stressed     HPI-here w/husb  Stress-overwhelmed.  Has cleaning person. Husb cooks/heats meals. Very distracted by other things and can't finish tasks. Has been like this in past, but getting worse.  Per husb "like ADD".   Can't find things. Distracted.  House a mess for 2 yrs since moved. Can spend hours on computer getting things done.  Emotional attachments to stuff so feels has to go thru things, then gets distracted and not getting anything accomplished. OCD as well.  Had a meltdown yesterday.  Taking valium '10mg'$  prn.  Agitated/depression/anxiety.  SSRI and wellbutrin in past-not help.  Never tried buspar.  Husb said he could tell a difference when she was on them.  No SI.  Pt states she needs high doses of meds to be effective.   Insomnia-ambien-can go months without but now 4x/wk.   Back pain-hard to get comfy. Sleeps in recliner. A lot of pelvic pain and hard to get moving in am.  PAF-pt was unaware.  Dr. Burt Knack. Syncope 3 mo ago. Has loop recorder.  Considering Watchman.  Pt falls a lot so no blood thinner. Getting distraught about all the things she has to do.   There are no preventive care reminders to display for this patient.   Past Medical History:  Diagnosis Date   Allergy    Anemia    Anxiety    Arthritis    degenerative disc disorder   Cancer ([facility])    skin cancer   Cardiac arrhythmia    Cataract    surgery in right eye in 03/2022   Depression    Diverticulitis    Diverticulosis    GERD (gastroesophageal reflux disease)    Heart murmur    HTN (hypertension)    not on meds in 2 years per pt on 05/19/22 n   Infertility, female    Neuromuscular disorder ([place])    idiopathic peripheral  neuropathy   Peripheral neuropathy    Positive PPD    SIADH (syndrome of inappropriate ADH  production) ([place])     Past Surgical History:  Procedure Laterality Date   BREAST BIOPSY Left    BREAST EXCISIONAL BIOPSY Left    benign   CATARACT EXTRACTION Right 2023   CERVICAL BIOPSY  W/ LOOP ELECTRODE EXCISION     CERVICAL POLYPECTOMY  02/2016   CHOLECYSTECTOMY N/A 06/02/2022   Procedure: LAPAROSCOPIC CHOLECYSTECTOMY WITH INTRAOPERATIVE CHOLANGIOGRAM;  Surgeon: Armandina Gemma, MD;  Location: [facility];  Service: General;  Laterality: N/A;   COLONOSCOPY     COLPOSCOPY     DILATION AND CURETTAGE OF UTERUS     HYSTEROSCOPY     LOOP RECORDER IMPLANT     LUMBAR [place] LAMINECTOMY     TONSILLECTOMY     UPPER GASTROINTESTINAL ENDOSCOPY      Outpatient Medications Prior to Visit  Medication Sig Dispense Refill   acetaminophen (TYLENOL) 500 MG tablet Take 1,000 mg by mouth every 6 (six) hours as needed for mild pain or moderate pain.     Alum Hydroxide-Mag Carbonate (GAVISCON EXTRA STRENGTH) 160-105 MG CHEW Chew 2 tablets by mouth daily as needed (upset stomach).     betamethasone valerate ointment (VALISONE) 0.1 % Apply 1 Application topically  2 (two) times daily as needed (itching).     Cholecalciferol 50 MCG (2000 UT) CAPS Take 4,000 Units by mouth daily.     diazepam (VALIUM) 10 MG tablet TAKE ONE TABLET EVERY 12 HOURS AS NEEDED FOR ANXIETY (Patient taking differently: Take 10 mg by mouth every 12 (twelve) hours as needed for anxiety.) 60 tablet 5   gabapentin (NEURONTIN) 600 MG tablet TAKE ONE TABLET THREE TIMES DAILY 270 tablet 1   hydrOXYzine (VISTARIL) 25 MG capsule Take 2 capsules (50 mg total) by mouth every 6 (six) hours. 720 capsule 0   ibuprofen (ADVIL) 200 MG tablet Take 400 mg by mouth every 6 (six) hours as needed for moderate pain.     mirabegron ER (MYRBETRIQ) 50 MG TB24 tablet Take 1 tablet (50 mg total) by mouth daily. One po qd (Patient taking differently: Take 50 mg by mouth daily.) 30 tablet 2   omeprazole (PRILOSEC) 40 MG capsule TAKE ONE CAPSULE  EACH DAY (Patient taking differently: Take 40 mg by mouth daily.) 90 capsule 1   ondansetron (ZOFRAN-ODT) 8 MG disintegrating tablet Take 1 tablet (8 mg total) by mouth every 8 (eight) hours as needed for nausea or vomiting. 20 tablet 1   Polyethyl Glyc-Propyl Glyc PF (SYSTANE HYDRATION PF) 0.4-0.3 % SOLN Place 1 drop into both eyes daily as needed (dry eyes).     Pramoxine HCl (CERAVE ITCH RELIEF) 1 % CREA Apply 1 Application topically daily as needed (itching).     Probiotic Product (ALIGN PO) Take 1 capsule by mouth daily.     Pumpkin Seed-Soy Germ (AZO BLADDER CONTROL/GO-LESS PO) Take 1 capsule by mouth in the morning, at noon, and at bedtime.     sodium chloride 1 g tablet Take 1 g by mouth daily.     traMADol (ULTRAM) 50 MG tablet Take 1-2 tablets (50-100 mg total) by mouth every 6 (six) hours as needed for moderate pain. 15 tablet 0   triamcinolone cream (KENALOG) 0.1 % Apply 1 Application topically as needed (itching).     zolpidem (AMBIEN) 10 MG tablet TAKE ONE TABLET BY MOUTH AT BEDTIME AS NEEDED FOR SLEEP (Patient taking differently: Take 10 mg by mouth at bedtime as needed for sleep. for sleep) 30 tablet 0   No facility-administered medications prior to visit.    Allergies  Allergen Reactions   Flagyl [Metronidazole] Hives   Iodixanol Swelling and Other (See Comments)    Throat swelling   Fentanyl Itching   Cipro [Ciprofloxacin Hcl] Hives   Dilaudid [Hydromorphone Hcl] Hives   ROS neg/noncontributory except as noted HPI/below      Objective:     BP (!) 140/78   Pulse 76   Temp 98 F (36.7 C) (Temporal)   Ht '5\' 7"'$  (1.702 m)   LMP  (LMP Unknown)   SpO2 97%   BMI 27.97 kg/m  Wt Readings from Last 3 Encounters:  06/02/22 178 lb 9.2 oz (81 kg)  05/27/22 180 lb (81.6 kg)  04/01/22 180 lb (81.6 kg)    Physical Exam   Gen: WDWN NAD wf HEENT: NCAT, conjunctiva not injected, sclera nonicteric NECK:  supple, no thyromegaly, no nodes, no carotid bruits CARDIAC: RRR,  S1S2+, no murmur. DP 2+B LUNGS: CTAB. No wheezes EXT:  no edema MSK: no gross abnormalities.  NEURO: A&O x3.  CN II-XII intact.  PSYCH: anxious mood, overwhelmed. Good eye contact  Spent 96mn w/pt and husb offering support, ideas, plan.      Assessment & Plan:  Problem List Items Addressed This Visit       Other   Anxiety - Primary   Relevant Medications   busPIRone (BUSPAR) 10 MG tablet   Primary insomnia   Other Visit Diagnoses     Need for immunization against influenza       Relevant Orders   Flu Vaccine QUAD High Dose(Fluad) (Completed)     1.  Anxiety-chronic.  Out-of-control.  She has been on multiple meds and did not think they worked, however husband states he noticed a difference.  Discussed the risks of taking Valium 10 mg as she has.  Discussed multiple methods of how to try to focus, accomplish small tasks.  Will trial BuSpar 10 mg twice daily.  May need higher doses.  Follow-up in 1 month 2.  Chronic insomnia-worsened by her anxiety.  Also has some chronic pain issues.  Taking Ambien 10 mg about 4 days/week.  Discussed the risks, however she has been doing this for a long time" doing okay" continue to monitor.  Hopefully BuSpar will help.  Meds ordered this encounter  Medications   busPIRone (BUSPAR) 10 MG tablet    Sig: Take 1 tablet (10 mg total) by mouth 2 (two) times daily.    Dispense:  60 tablet    Refill:  1    Wellington Hampshire, MD

## 2022-06-17 NOTE — Patient Instructions (Addendum)
Buspar 1/2 tab twice/day for 3 days then whole tab twice/day.  Let me know after 10/14 how doing.  Can let me know sooner.   Set limits-41mn on email

## 2022-06-17 NOTE — Telephone Encounter (Signed)
Per Dr. Lovena Le, called to schedule Watchman consult with Dr. Burt Knack. The patient states she will call back.

## 2022-06-19 ENCOUNTER — Telehealth: Payer: Self-pay | Admitting: Cardiovascular Disease

## 2022-06-19 NOTE — Telephone Encounter (Signed)
Patient called office back after two unsuccessful attempts to reach her/spouse. Spoke with Dr Burt Knack regarding this question and he requests that the husband come to the watchman appt. He does state that we can educate the husband that the purpose of LAAO is all about stroke risk. If someone has a-fib, which has no cure-only treatment, they are at an increased risk for stroke and LAAO is strictly to reduce stroke risk in patients who are not candidates for anticoagulation. John (pt's husband) states that he understands this and is not against the idea of the procedure, but wants to know why Dr Lovena Le has not prescribed her an antiarrythmic to help with her A-fib. He states "I feel she has been overlooked and would love to see her on something to help with her rhythm to see if she is better with symptoms and all that having a-fib brings about." Advised that I would route this to Dr Lovena Le and his nurse, but that it would be next Thursday or Friday before he is back in office to address this. John understands. Olean Ree also requests that we contact Lenice Llamas to ask her to call her back (assuming she wants to schedule LAAO consult appt). Routing to Olga as well.

## 2022-06-19 NOTE — Telephone Encounter (Signed)
Patient's husband states the patient is currently taking no antiarrhythmic medications and the office is planning a watchman. He says as a physician himself he is concerned the office may be treating complications of the disease, but not the disease itself. He states prior to having an invasive procedure he would like to know why the disease is not being treated first.

## 2022-06-23 ENCOUNTER — Encounter: Payer: Self-pay | Admitting: Cardiovascular Disease

## 2022-06-24 ENCOUNTER — Other Ambulatory Visit (HOSPITAL_BASED_OUTPATIENT_CLINIC_OR_DEPARTMENT_OTHER): Payer: Self-pay | Admitting: Obstetrics & Gynecology

## 2022-06-24 DIAGNOSIS — L299 Pruritus, unspecified: Secondary | ICD-10-CM | POA: Diagnosis not present

## 2022-06-24 DIAGNOSIS — L820 Inflamed seborrheic keratosis: Secondary | ICD-10-CM | POA: Diagnosis not present

## 2022-06-24 DIAGNOSIS — L570 Actinic keratosis: Secondary | ICD-10-CM | POA: Diagnosis not present

## 2022-06-24 DIAGNOSIS — D251 Intramural leiomyoma of uterus: Secondary | ICD-10-CM

## 2022-06-24 NOTE — Telephone Encounter (Signed)
The patient called back and spoke with the Structural Heart office 06/20/22. She has been scheduled 10/16 for appointment with Dr. Burt Knack.

## 2022-06-24 NOTE — Telephone Encounter (Signed)
The patient called back and spoke with the Structural Heart office 06/20/22. She was offered an earlier appointment but declined. She has been scheduled 10/16 for appointment with Dr. Burt Knack.

## 2022-06-30 ENCOUNTER — Other Ambulatory Visit: Payer: Self-pay | Admitting: Family Medicine

## 2022-07-03 ENCOUNTER — Other Ambulatory Visit: Payer: Self-pay | Admitting: Internal Medicine

## 2022-07-04 NOTE — Progress Notes (Signed)
Carelink Summary Report / Loop Recorder 

## 2022-07-07 ENCOUNTER — Encounter: Payer: Self-pay | Admitting: Cardiovascular Disease

## 2022-07-07 ENCOUNTER — Ambulatory Visit: Payer: Medicare Other | Attending: Cardiovascular Disease | Admitting: Cardiovascular Disease

## 2022-07-07 VITALS — BP 150/92 | HR 75 | Ht 65.0 in | Wt 180.0 lb

## 2022-07-07 DIAGNOSIS — I480 Paroxysmal atrial fibrillation: Secondary | ICD-10-CM | POA: Diagnosis not present

## 2022-07-07 MED ORDER — APIXABAN 5 MG PO TABS: 5.0000 mg | ORAL_TABLET | Freq: Two times a day (BID) | ORAL | 11 refills | Status: DC

## 2022-07-07 NOTE — Progress Notes (Unsigned)
Watchman Consult Note   Date:  07/07/2022   ID:  Katrina Benitez, Katrina Benitez, 1947, MRN [id number]  PCP:  Tawnya Crook, MD  Cardiologist:  Dr Burt Knack Primary Electrophysiologist: Dr Lovena Le Referring Physician: Dr Lovena Le   CC: to discuss Watchman implant    History of Present Illness: Katrina Benitez is a 76 y.o. female referred by Dr Lovena Le for evaluation of atrial fibrillation and stroke prevention. She has paroxysmal atrial fibrillation.  The patient has been evaluated by their referring physician and is felt to be a poor candidate for long term [facility] due to fall risk.  She therefore presents today for Watchman evaluation.    Today, she denies symptoms of palpitations, chest pain, shortness of breath, orthopnea, PND, claudication, dizziness, presyncope, syncope, bleeding, or neurologic sequela. The patient is tolerating medications without difficulties and is otherwise without complaint today.   The patient is here with her husband today. States that her BP was 126/62 mmHg this morning at home.  They have done some research on the Watchman device.  The patient had a bad fall about 3 years ago.  Since that time, she has had some issues with gait instability but she has not had any falls per her report and her husband confirms this.  She has some problems with lower extremity edema.  She has no other complaints at this time.   Past Medical History:  Diagnosis Date   Allergy    Anemia    Anxiety    Arthritis    degenerative disc disorder   Cancer (HCC)    skin cancer   Cardiac arrhythmia    Cataract    surgery in right eye in 03/2022   Depression    Diverticulitis    Diverticulosis    GERD (gastroesophageal reflux disease)    Heart murmur    HTN (hypertension)    not on meds in 2 years per pt on 05/19/22 n   Infertility, female    Neuromuscular disorder ([place])    idiopathic peripheral  neuropathy   Peripheral neuropathy    Positive PPD    SIADH (syndrome of inappropriate ADH  production) ([place])    Past Surgical History:  Procedure Laterality Date   BREAST BIOPSY Left    BREAST EXCISIONAL BIOPSY Left    benign   CATARACT EXTRACTION Right 2023   CERVICAL BIOPSY  W/ LOOP ELECTRODE EXCISION     CERVICAL POLYPECTOMY  02/2016   CHOLECYSTECTOMY N/A 06/02/2022   Procedure: LAPAROSCOPIC CHOLECYSTECTOMY WITH INTRAOPERATIVE CHOLANGIOGRAM;  Surgeon: Armandina Gemma, MD;  Location: [facility];  Service: General;  Laterality: N/A;   COLONOSCOPY     COLPOSCOPY     DILATION AND CURETTAGE OF UTERUS     HYSTEROSCOPY     LOOP RECORDER IMPLANT     LUMBAR DISC SURGERY     LUMBAR LAMINECTOMY     TONSILLECTOMY     UPPER GASTROINTESTINAL ENDOSCOPY       Current Outpatient Medications  Medication Sig Dispense Refill   acetaminophen (TYLENOL) 500 MG tablet Take 1,000 mg by mouth every 6 (six) hours as needed for mild pain or moderate pain.     Alum Hydroxide-Mag Carbonate (GAVISCON EXTRA STRENGTH) 160-105 MG CHEW Chew 2 tablets by mouth daily as needed (upset stomach).     apixaban (ELIQUIS) 5 MG TABS tablet Take 1 tablet (5 mg total) by mouth 2 (two) times daily. 60 tablet 11   betamethasone valerate ointment (VALISONE) 0.1 % Apply 1 Application topically  2 (two) times daily as needed (itching).     busPIRone (BUSPAR) 10 MG tablet Take 1 tablet (10 mg total) by mouth 2 (two) times daily. 60 tablet 1   Cholecalciferol 50 MCG (2000 UT) CAPS Take 4,000 Units by mouth daily.     diazepam (VALIUM) 10 MG tablet TAKE ONE TABLET EVERY 12 HOURS AS NEEDED FOR ANXIETY (Patient taking differently: Take 10 mg by mouth every 12 (twelve) hours as needed for anxiety.) 60 tablet 5   gabapentin (NEURONTIN) 600 MG tablet TAKE ONE TABLET THREE TIMES DAILY 270 tablet 1   hydrOXYzine (VISTARIL) 25 MG capsule Take 2 capsules (50 mg total) by mouth every 6 (six) hours. 720 capsule 0   mirabegron ER (MYRBETRIQ) 50 MG TB24 tablet Take 1 tablet (50 mg total) by mouth daily. One po qd (Patient taking differently:  Take 50 mg by mouth daily.) 30 tablet 2   omeprazole (PRILOSEC) 40 MG capsule TAKE ONE CAPSULE EACH DAY (Patient taking differently: Take 40 mg by mouth daily.) 90 capsule 1   ondansetron (ZOFRAN-ODT) 8 MG disintegrating tablet TAKE ONE TABLET EVERY EIGHT HOURS AS NEEDED FOR NAUSEA AND VOMITING 20 tablet 1   Polyethyl Glyc-Propyl Glyc PF (SYSTANE HYDRATION PF) 0.4-0.3 % SOLN Place 1 drop into both eyes daily as needed (dry eyes).     Pramoxine HCl (CERAVE ITCH RELIEF) 1 % CREA Apply 1 Application topically daily as needed (itching).     Probiotic Product (ALIGN PO) Take 1 capsule by mouth daily.     Pumpkin Seed-Soy Germ (AZO BLADDER CONTROL/GO-LESS PO) Take 1 capsule by mouth in the morning, at noon, and at bedtime.     sodium chloride 1 g tablet Take 1 g by mouth daily.     triamcinolone cream (KENALOG) 0.1 % Apply 1 Application topically as needed (itching).     zolpidem (AMBIEN) 10 MG tablet TAKE ONE TABLET BY MOUTH AT BEDTIME AS NEEDED FOR SLEEP 30 tablet 0   traMADol (ULTRAM) 50 MG tablet Take 1-2 tablets (50-100 mg total) by mouth every 6 (six) hours as needed for moderate pain. (Patient not taking: Reported on 07/07/2022) 15 tablet 0   No current facility-administered medications for this visit.    Allergies:   Flagyl [metronidazole], Iodixanol, Fentanyl, Cipro [ciprofloxacin hcl], and Dilaudid [hydromorphone hcl]   Social History:  The patient  reports that she has never smoked. She has never used smokeless tobacco. She reports current alcohol use of about 7.0 standard drinks of alcohol per week. She reports that she does not use drugs.   Family History:  The patient's  family history includes Breast cancer in her mother; Depression in her brother and father; Early death in her father; Glaucoma in her mother; Heart attack in her father; Heart disease in her father; Hypertension in her brother; Miscarriages / [place] in her mother; Prostate cancer in her paternal grandfather;  Tuberculosis in her paternal grandmother.    ROS:  Please see the history of present illness.   All other systems are reviewed and negative.    PHYSICAL EXAM: VS:  BP (!) 150/92   Pulse 75   Ht '5\' 5"'$  (1.651 m)   Wt 180 lb (81.6 kg) Comment: per patient "Refused to get weighed"  LMP  (LMP Unknown)   SpO2 98%   BMI Benitez.95 kg/m  , BMI Body mass index is Benitez.95 kg/m. GEN: Well nourished, well developed, in no acute distress  HEENT: normal  Neck: no JVD, carotid bruits, or masses  Cardiac: RRR; no murmurs, rubs, or gallops,no edema  Respiratory:  clear to auscultation bilaterally, normal work of breathing GI: soft, nontender, nondistended, + BS MS: no deformity or atrophy  Skin: warm and dry  Neuro:  Strength and sensation are intact Psych: euthymic mood, full affect  EKG:  EKG is ordered today. The ekg ordered today shows normal sinus rhythm 75 bpm, within normal limits.   Recent Labs: 06/02/2022: ALT 18; BUN 15; Creatinine, Ser 1.03; Hemoglobin 11.1; Platelets 256; Potassium 5.3; Sodium 139    Lipid Panel     Component Value Date/Time   CHOL 231 (H) 12/27/2020 1426   TRIG 90.0 12/27/2020 1426   HDL 111.90 12/27/2020 1426   CHOLHDL 2 12/27/2020 1426   VLDL 18.0 12/27/2020 1426   LDLCALC 101 (H) 12/27/2020 1426   LDLCALC 99 07/13/2020 1500     Wt Readings from Last 3 Encounters:  07/07/22 180 lb (81.6 kg)  06/02/22 178 lb 9.2 oz (81 kg)  05/27/22 180 lb (81.6 kg)      Other studies Reviewed: Additional studies/ records that were reviewed today include:  Echo 02/15/2021: 1. Left ventricular ejection fraction, by estimation, is 60 to 65%. The  left ventricle has normal function. The left ventricle has no regional  wall motion abnormalities. Left ventricular diastolic parameters were  normal.   2. Right ventricular systolic function is normal. The right ventricular  size is normal. There is normal pulmonary artery systolic pressure.   3. The mitral valve is normal  in structure. Trivial mitral valve  regurgitation. No evidence of mitral stenosis.   4. The aortic valve is tricuspid. Aortic valve regurgitation is mild.  Mild aortic valve sclerosis is present, with no evidence of aortic valve  stenosis.   5. Aortic dilatation noted. There is mild dilatation of the ascending  aorta, measuring 40 mm.   6. The inferior vena cava is normal in size with greater than 50%  respiratory variability, suggesting right atrial pressure of 3 mmHg.      ASSESSMENT AND PLAN:  1.  Paroxysmal atrial fibrillation I have seen Shakerria Parran is a 76 y.o. female in the office today who has been referred for a Watchman left atrial appendage closure device.  She has a history of paroxysmal atrial fibrillation.    The patient's atrial fibrillation-related risk scores are below:     CHA2DS2-VASc Score = 3  {Confirm score is correct.  If not, click here to update score.  REFRESH note.  :1} This indicates a 3.2% annual risk of stroke. The patient's score is based upon: CHF History: 0 HTN History: 0 Diabetes History: 0 Stroke History: 0 Vascular Disease History: 0 Age Score: 2 Gender Score: 1   {This patient has a significant risk of stroke if diagnosed with atrial fibrillation.  Please consider VKA or DOAC agent for anticoagulation if the bleeding risk is acceptable.   You can also use the SmartPhrase .Murphy for documentation.   :[id number]}    HasBled score is 3 for age, blood thinner, and alcohol. All other negative.   I personally reviewed indications for chronic oral anticoagulation, concerns about anticoagulation and fall risk, and consideration of left atrial appendage occlusion with the patient and her husband today.  I reviewed the watchman implant procedure, discussed procedural risks, indications, and alternatives with them.  We contrasted this with the risk and benefit of oral anticoagulation.  After that discussion, they have decided not to proceed with  watchman implantation.  They favor  a trial of anticoagulation.  They feel that her fall risk is low and she has not had a significant fall in about 3 years.  She will be started on apixaban 5 mg twice daily.  She understands to contact me if she has extensive bleeding, bruising, or starts to experience more problems with gait unsteadiness and falling.  Otherwise, she will continue on current medications without change.  If she decides to move forward with watchman evaluation in the future, she will need a gated cardiac CTA to assess anatomic suitability for device implantation.  I think she would be a reasonable candidate if she decides to pursue this.   Current medicines are reviewed at length with the patient today.   The patient {ACTIONS; HAS/DOES NOT HAVE:19233} concerns regarding her medicines.  The following changes were made today:  {NONE DEFAULTED:18576}  Labs/ tests ordered today include: *** Orders Placed This Encounter  Procedures   EKG 12-Lead     Signed, Sherren Mocha, MD 07/07/2022  5:01 PM     [facility] [address] [place] [address] [phone] (office) [phone] (fax)

## 2022-07-07 NOTE — Patient Instructions (Signed)
Medication Instructions:  START Eliquis '5mg'$  twice daily *If you need a refill on your cardiac medications before your next appointment, please call your pharmacy*   Lab Work: NONE If you have labs (blood work) drawn today and your tests are completely normal, you will receive your results only by: [place] (if you have MyChart) OR A paper copy in the mail If you have any lab test that is abnormal or we need to change your treatment, we will call you to review the results.   Testing/Procedures: NONE   Follow-Up: At [facility], you and your health needs are our priority.  As part of our continuing mission to provide you with exceptional heart care, we have created designated Provider Care Teams.  These Care Teams include your primary Cardiologist (physician) and Advanced Practice Providers (APPs -  Physician Assistants and Nurse Practitioners) who all work together to provide you with the care you need, when you need it.  We recommend signing up for the patient portal called "MyChart".  Sign up information is provided on this After Visit Summary.  MyChart is used to connect with patients for Virtual Visits (Telemedicine).  Patients are able to view lab/test results, encounter notes, upcoming appointments, etc.  Non-urgent messages can be sent to your provider as well.   To learn more about what you can do with MyChart, go to [email or web address].    Your next appointment:   6 month(s)  The format for your next appointment:   In Person  Provider:   Sherren Mocha, MD or APP     Important Information About Sugar

## 2022-07-10 ENCOUNTER — Other Ambulatory Visit: Payer: Self-pay | Admitting: Family Medicine

## 2022-07-15 DIAGNOSIS — I480 Paroxysmal atrial fibrillation: Secondary | ICD-10-CM | POA: Diagnosis not present

## 2022-07-18 ENCOUNTER — Telehealth: Payer: Self-pay | Admitting: Family Medicine

## 2022-07-18 NOTE — Telephone Encounter (Signed)
Patient requests to speak with Dr. Cherlynn Kaiser YO:[id number]. Did not elaborate. Patient's ph# [phone]

## 2022-07-21 ENCOUNTER — Ambulatory Visit: Payer: Medicare Other | Attending: Internal Medicine

## 2022-07-21 DIAGNOSIS — I480 Paroxysmal atrial fibrillation: Secondary | ICD-10-CM | POA: Diagnosis not present

## 2022-07-21 NOTE — Telephone Encounter (Signed)
Please see message below

## 2022-07-22 ENCOUNTER — Other Ambulatory Visit: Payer: Self-pay | Admitting: Family Medicine

## 2022-07-22 DIAGNOSIS — R269 Unspecified abnormalities of gait and mobility: Secondary | ICD-10-CM

## 2022-07-22 LAB — CUP PACEART REMOTE DEVICE CHECK
Date Time Interrogation Session: 20231026221923
Implantable Pulse Generator Implant Date: 20230614

## 2022-07-22 NOTE — Progress Notes (Signed)
Pt states buspar not helping and doesn't want to be on it so ok to d/c On Eliquis for a fib Fell in bathroom-bruised abd.  Requesting PT for gait eval and training    Since appt on 11/7 was for what we just discussed, would like to cancel.       Removed buspar from list.  Canceled appt for 11/7.  Referral placed for PT

## 2022-07-29 ENCOUNTER — Telehealth: Payer: Medicare Other | Admitting: Family Medicine

## 2022-07-30 ENCOUNTER — Ambulatory Visit: Payer: Medicare Other | Admitting: Obstetrics and Gynecology

## 2022-08-05 ENCOUNTER — Telehealth (HOSPITAL_BASED_OUTPATIENT_CLINIC_OR_DEPARTMENT_OTHER): Payer: Self-pay | Admitting: Obstetrics & Gynecology

## 2022-08-05 ENCOUNTER — Other Ambulatory Visit (HOSPITAL_BASED_OUTPATIENT_CLINIC_OR_DEPARTMENT_OTHER): Payer: Self-pay | Admitting: *Deleted

## 2022-08-05 MED ORDER — MIRABEGRON ER 50 MG PO TB24: 50.0000 mg | ORAL_TABLET | Freq: Every day | ORAL | 2 refills | Status: DC

## 2022-08-05 NOTE — Telephone Encounter (Signed)
LMOVM that I am returning call regarding refill request

## 2022-08-05 NOTE — Telephone Encounter (Signed)
Patient called and stated her pharmacy sent a prescription on Friday .

## 2022-08-12 ENCOUNTER — Ambulatory Visit: Payer: Medicare Other | Admitting: Physical Therapy

## 2022-08-12 NOTE — Therapy (Deleted)
OUTPATIENT PHYSICAL THERAPY LOWER EXTREMITY EVALUATION  Patient Name: Katrina Benitez MRN: [id number] DOB:1946-08-30, 76 y.o., female Today's Date: 08/12/2022    Past Medical History:  Diagnosis Date   Allergy    Anemia    Anxiety    Arthritis    degenerative disc disorder   Cancer ([place])    skin cancer   Cardiac arrhythmia    Cataract    surgery in right eye in 03/2022   Depression    Diverticulitis    Diverticulosis    GERD (gastroesophageal reflux disease)    Heart murmur    HTN (hypertension)    not on meds in 2 years per pt on 05/19/22 n   Infertility, female    Neuromuscular disorder ([place])    idiopathic peripheral  neuropathy   Peripheral neuropathy    Positive PPD    SIADH (syndrome of inappropriate ADH production) ([place])    Past Surgical History:  Procedure Laterality Date   BREAST BIOPSY Left    BREAST EXCISIONAL BIOPSY Left    benign   CATARACT EXTRACTION Right 2023   CERVICAL BIOPSY  W/ LOOP ELECTRODE EXCISION     CERVICAL POLYPECTOMY  02/2016   CHOLECYSTECTOMY N/A 06/02/2022   Procedure: LAPAROSCOPIC CHOLECYSTECTOMY WITH INTRAOPERATIVE CHOLANGIOGRAM;  Surgeon: Armandina Gemma, MD;  Location: [facility];  Service: General;  Laterality: N/A;   COLONOSCOPY     COLPOSCOPY     DILATION AND CURETTAGE OF UTERUS     HYSTEROSCOPY     LOOP RECORDER IMPLANT     LUMBAR [place]     UPPER GASTROINTESTINAL ENDOSCOPY     Patient Active Problem List   Diagnosis Date Noted   Abdominal pain, right upper quadrant 05/18/2022   Symptomatic cholelithiasis 05/18/2022   Syncope and collapse 03/05/2022   Paroxysmal atrial fibrillation ([place]) 01/27/2022   Increased risk of breast cancer 04/18/2021   Anemia 01/09/2021   Pruritus 11/14/2019   Depression, recurrent ([place]) 02/18/2019   Primary insomnia 02/18/2019   Atrial fibrillation with RVR ([place]) 01/19/2019   Hyperlipidemia 09/30/2018   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  REFERRING DIAG: Gait abnormality [R26.9]   Rationale for Evaluation and Treatment:  Rehabilitation  SUBJECTIVE:  PERTINENT PAST HISTORY:  Idiopathic peripheral neuropathy, afib, syncope        PRECAUTIONS: {Therapy precautions:24002}  WEIGHT BEARING RESTRICTIONS {Yes ***/No:24003}  FALLS:  Has patient fallen in last 6 months? {yes/no:20286}, Number of falls: ***  MOI/History of condition:  Onset date: ***  SUBJECTIVE STATEMENT  Katrina Benitez is a 76 y.o. female who presents to clinic with chief complaint of ***.  ***  From referring provider:   "***"   Red flags:  {has/denies:26543} {kerredflag:26542}  Pain:  Are you having pain? {yes/no:20286} Pain location: *** NPRS scale:  {NUMBERS; 0-10:5044}/10 to {NUMBERS; 0-10:5044}/10 Aggravating factors: *** Relieving factors: *** Pain description: {PAIN DESCRIPTION:21022940} Stage: {Desc; acute/subacute/chronic:13799} Stability: {kerbetterworse:26715} 24 hour pattern: ***  Occupation: ***  Assistive Device: ***  Hand Dominance: ***  Patient Goals/Specific Activities: ***   OBJECTIVE:   DIAGNOSTIC FINDINGS:  ***   GENERAL OBSERVATION/GAIT:   ***  SENSATION:  Light touch: {intact/deficits:24005}  PALPATION: ***  LE MMT:  MMT Right 08/12/2022 Left 08/12/2022  Hip flexion (L2, L3) *** ***  Knee extension (L3) *** ***  Knee flexion *** ***  Hip abduction *** ***  Hip extension *** ***  Hip external  rotation    Hip internal rotation    Hip adduction    Ankle dorsiflexion (L4)    Ankle plantarflexion (S1)    Ankle inversion    Ankle eversion    Great Toe ext (L5)    Grossly     (Blank rows = not tested, score listed is out of 5 possible points.  N = WNL, D = diminished, C = clear for gross weakness with myotome testing, * = concordant pain with testing)  LE ROM:  ROM Right 08/12/2022 Left 08/12/2022  Hip flexion    Hip extension    Hip abduction    Hip adduction    Hip internal rotation    Hip external rotation    Knee flexion    Knee extension    Ankle dorsiflexion    Ankle plantarflexion    Ankle inversion    Ankle eversion     (Blank rows = not tested, N = WNL, * = concordant pain with testing)  Functional Tests  Eval (08/12/2022)    BERG BALANCE TEST {Sitting to Standing:26801:::1} {Standing Unsupported:26802:::1} {Sitting Unsupported:26803:::1} {Standing to Sitting:26804:::1} {Transfers:26805:::1} {Standing with eyes closed:26806:::1} {Standing with feet together:26807:::1} {Reaching forward with outstretched arm:26808:::1} {Retrieving object from the floor:26809:::1} {Turning to look behind:26810:::1} {Turning 360 degrees:26811:::1} {Place alternate foot on stool:26812:::1} {Standing with one foot in front:26813:::1} {Standing on one foot:26814:::1}  Total Score: ***/56    Katrina Benitez will improve 30'' STS (MCID 2) to >/= ***x (w/ UE?: ***) to show improved LE strength and improved transfers   Evaluation/Baseline (08/12/2022): ***x  w/ UE? *** Goal status: INITIAL                                                      PATIENT SURVEYS:  FOTO ***   TODAY'S TREATMENT: ***   PATIENT EDUCATION:  POC, diagnosis, prognosis, HEP, and outcome measures.  Pt educated via explanation, demonstration, and handout (HEP).  Pt confirms understanding verbally.   HOME EXERCISE PROGRAM: ***  ASTERISK SIGNS   Asterisk Signs Eval (08/12/2022)                                                  ASSESSMENT:  CLINICAL IMPRESSION: Katrina Benitez is a 76 y.o. female who presents to clinic with signs and sxs consistent with ***.    OBJECTIVE IMPAIRMENTS: Pain, ***  ACTIVITY LIMITATIONS: ***  PERSONAL FACTORS: See medical history and pertinent history   REHAB POTENTIAL: {rehabpotential:25112}  CLINICAL DECISION MAKING: {clinical decision making:25114}  EVALUATION COMPLEXITY: {Evaluation complexity:25115}   GOALS:   SHORT TERM GOALS: Target date: 09/09/2022  Katrina Benitez will be >75% HEP compliant to improve carryover between sessions and facilitate independent management of condition  Evaluation (08/12/2022): ongoing Goal  status: INITIAL   LONG TERM GOALS: Target date: 10/07/2022  Katrina Benitez will improve FOTO score to *** as a proxy for functional improvement  Evaluation/Baseline (08/12/2022): *** Goal status: INITIAL   2.  ***   3.  ***   4.  ***   5.  ***   6.  ***   PLAN: PT FREQUENCY: 1-2x/week  PT DURATION: 8 weeks (Ending 10/07/2022)  PLANNED INTERVENTIONS: Therapeutic exercises, Aquatic therapy, Therapeutic activity, Neuro Muscular re-education, Gait training, Patient/Family education, Joint mobilization, Dry Needling, Electrical stimulation, Spinal mobilization and/or manipulation, Moist heat, Taping, Vasopneumatic device, Ionotophoresis '4mg'$ /ml Dexamethasone, and Manual therapy  PLAN FOR NEXT SESSION: ***   Shearon Balo PT, DPT 08/12/2022, 9:14 AM

## 2022-08-15 ENCOUNTER — Other Ambulatory Visit: Payer: Self-pay | Admitting: Family Medicine

## 2022-08-18 ENCOUNTER — Other Ambulatory Visit: Payer: Self-pay

## 2022-08-18 NOTE — Telephone Encounter (Signed)
Incoming fax for Ambien refill, please advise

## 2022-08-21 ENCOUNTER — Telehealth: Payer: Self-pay | Admitting: *Deleted

## 2022-08-21 NOTE — Telephone Encounter (Signed)
Pharmacy sent refill request for Ambien 10 mg tablet, 30,0  Last refill: 08/16/22 Last ov: 06/17/22 Next ov: none scheduled

## 2022-08-22 ENCOUNTER — Other Ambulatory Visit: Payer: Self-pay | Admitting: *Deleted

## 2022-08-22 MED ORDER — BUSPIRONE HCL 15 MG PO TABS: 15.0000 mg | ORAL_TABLET | Freq: Two times a day (BID) | ORAL | 0 refills | Status: AC

## 2022-08-22 NOTE — Telephone Encounter (Signed)
Patient notified of message below and verbalized understanding. Rx for 15 mg sent to pharmacy per patient request.

## 2022-08-22 NOTE — Telephone Encounter (Signed)
Patient stated that she just refilled medication, doesn't need refill at the moment, only takes as need, sometimes goes a month without taking. Patient also stated she wanted pcp to know that she has started back taking BuSpar 10 mg bid, wanted to see if dose can be changed because medication is not working. Please advise.

## 2022-08-23 NOTE — Progress Notes (Signed)
Carelink Summary Report / Loop Recorder 

## 2022-08-25 ENCOUNTER — Ambulatory Visit (INDEPENDENT_AMBULATORY_CARE_PROVIDER_SITE_OTHER): Payer: Medicare Other

## 2022-08-25 DIAGNOSIS — I480 Paroxysmal atrial fibrillation: Secondary | ICD-10-CM | POA: Diagnosis not present

## 2022-08-26 LAB — CUP PACEART REMOTE DEVICE CHECK
Date Time Interrogation Session: 20231203232113
Implantable Pulse Generator Implant Date: 20230614

## 2022-09-17 ENCOUNTER — Encounter (HOSPITAL_BASED_OUTPATIENT_CLINIC_OR_DEPARTMENT_OTHER): Payer: Self-pay

## 2022-09-17 ENCOUNTER — Emergency Department (HOSPITAL_BASED_OUTPATIENT_CLINIC_OR_DEPARTMENT_OTHER)
Admission: EM | Admit: 2022-09-17 | Discharge: 2022-09-17 | Disposition: A | Discharge status: Home / Self Care | Payer: Medicare Other | Attending: Emergency Medicine | Admitting: Emergency Medicine

## 2022-09-17 ENCOUNTER — Other Ambulatory Visit: Payer: Self-pay

## 2022-09-17 DIAGNOSIS — R040 Epistaxis: Secondary | ICD-10-CM | POA: Diagnosis not present

## 2022-09-17 DIAGNOSIS — Z85828 Personal history of other malignant neoplasm of skin: Secondary | ICD-10-CM | POA: Insufficient documentation

## 2022-09-17 DIAGNOSIS — Z7901 Long term (current) use of anticoagulants: Secondary | ICD-10-CM | POA: Insufficient documentation

## 2022-09-17 DIAGNOSIS — I4891 Unspecified atrial fibrillation: Secondary | ICD-10-CM | POA: Insufficient documentation

## 2022-09-17 DIAGNOSIS — I10 Essential (primary) hypertension: Secondary | ICD-10-CM | POA: Diagnosis not present

## 2022-09-17 LAB — CBC WITH DIFFERENTIAL/PLATELET
Abs Immature Granulocytes: 0.01 10*3/uL (ref 0.00–0.07)
Basophils Absolute: 0.1 10*3/uL (ref 0.0–0.1)
Basophils Relative: 1 %
Eosinophils Absolute: 0.2 10*3/uL (ref 0.0–0.5)
Eosinophils Relative: 4 %
HCT: 31.5 % — ABNORMAL LOW (ref 36.0–46.0)
Hemoglobin: 10.4 g/dL — ABNORMAL LOW (ref 12.0–15.0)
Immature Granulocytes: 0 %
Lymphocytes Relative: 33 %
Lymphs Abs: 1.8 10*3/uL (ref 0.7–4.0)
MCH: 25.2 pg — ABNORMAL LOW (ref 26.0–34.0)
MCHC: 33 g/dL (ref 30.0–36.0)
MCV: 76.5 fL — ABNORMAL LOW (ref 80.0–100.0)
Monocytes Absolute: 0.6 10*3/uL (ref 0.1–1.0)
Monocytes Relative: 12 %
Neutro Abs: 2.7 10*3/uL (ref 1.7–7.7)
Neutrophils Relative %: 50 %
Platelets: 228 10*3/uL (ref 150–400)
RBC: 4.12 MIL/uL (ref 3.87–5.11)
RDW: 14.4 % (ref 11.5–15.5)
WBC: 5.5 10*3/uL (ref 4.0–10.5)
nRBC: 0 % (ref 0.0–0.2)

## 2022-09-17 LAB — BASIC METABOLIC PANEL
Anion gap: 8 (ref 5–15)
BUN: 14 mg/dL (ref 8–23)
CO2: 27 mmol/L (ref 22–32)
Calcium: 9.5 mg/dL (ref 8.9–10.3)
Chloride: 99 mmol/L (ref 98–111)
Creatinine, Ser: 0.93 mg/dL (ref 0.44–1.00)
GFR, Estimated: >60 mL/min (ref >60)
Glucose, Bld: 104 mg/dL — ABNORMAL HIGH (ref 70–99)
Potassium: 4.2 mmol/L (ref 3.5–5.1)
Sodium: 134 mmol/L — ABNORMAL LOW (ref 135–145)

## 2022-09-17 LAB — PROTIME-INR
INR: 1.1 (ref 0.8–1.2)
Prothrombin Time: 14.4 seconds (ref 11.4–15.2)

## 2022-09-17 MED ORDER — OXYCODONE HCL 5 MG PO TABS
5.0000 mg | ORAL_TABLET | Freq: Once | ORAL | Status: AC
  Administered 2022-09-17: 5 mg via ORAL
  Filled 2022-09-17: qty 1

## 2022-09-17 MED ORDER — ACETAMINOPHEN 500 MG PO TABS
1000.0000 mg | ORAL_TABLET | Freq: Once | ORAL | Status: AC
  Administered 2022-09-17: 1000 mg via ORAL
  Filled 2022-09-17: qty 2

## 2022-09-17 MED ORDER — SILVER NITRATE-POT NITRATE 75-25 % EX MISC
1.0000 | Freq: Once | CUTANEOUS | Status: AC
  Administered 2022-09-17: 1 via TOPICAL
  Filled 2022-09-17: qty 10

## 2022-09-17 MED ORDER — CEPHALEXIN 500 MG PO CAPS: 500.0000 mg | ORAL_CAPSULE | Freq: Three times a day (TID) | ORAL | 0 refills | Status: DC

## 2022-09-17 MED ORDER — CEPHALEXIN 250 MG PO CAPS
500.0000 mg | ORAL_CAPSULE | Freq: Once | ORAL | Status: AC
  Administered 2022-09-17: 500 mg via ORAL
  Filled 2022-09-17: qty 2

## 2022-09-17 MED ORDER — HYDROCODONE-ACETAMINOPHEN 5-325 MG PO TABS: 1.0000 | ORAL_TABLET | ORAL | 0 refills | Status: DC | PRN

## 2022-09-17 NOTE — ED Notes (Signed)
Pt and spouse agreeable with d/c plan as discussed by provider.  This nurse has verbally reinforced d/c instructions and provided pt with written copy.  Pt acknowledges verbal understanding and denies any addl questions, concerns, needs- pt ambulatory at d/c accompanied by spouse; gait slow but steady - no acute changes/distress at d/c

## 2022-09-17 NOTE — ED Triage Notes (Signed)
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 Patient states she is on eliquis and last dose was taken this AM. Patient presents with papertowels on nose. Patient bleeding not under control.

## 2022-09-17 NOTE — Discharge Instructions (Addendum)
Do not take Eliquis tonight or tomorrow morning.

## 2022-09-17 NOTE — ED Notes (Signed)
Pt tolerated ambulating in the hallway well. Did not report any dizziness or weakness.

## 2022-09-17 NOTE — ED Notes (Addendum)
ED Provider at bedside to answer addl questions pt has regarding rhino rocket; pt concerned re: minor blood oozing.  Pt has ambulated with ED tech Alyssa - per pt symptoms did not worsen and no new concerning symptoms developed with ambulation.

## 2022-09-17 NOTE — ED Notes (Addendum)
ED provider at bedside preparing to insert rhino rocket - pt manually holding pressure to nares at this time; awake and alert; GCS 15 - no acute distress noted.

## 2022-09-17 NOTE — ED Provider Notes (Signed)
[facility] Provider Note   CSN: [id number] Arrival date & time: 09/17/22  1925     History  Chief Complaint  Patient presents with   Epistaxis    Katrina Benitez is a 76 y.o. female.  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  Pt has a pmhx significant for htn, afib (on eliquis), SIADH, GERD, peripheral neuropathy, anemia, depression, and skin cancer.  She said she was blowing her nose this evening and he nose started bleeding briskly.  She tried afrin and clamping the nose, but it kept bleeding. Pt has never had problems with nose bleeds in the past.       Home Medications Prior to Admission medications   Medication Sig Start Date End Date Taking? Authorizing Provider  cephALEXin (KEFLEX) 500 MG capsule Take 1 capsule (500 mg total) by mouth 3 (three) times daily. 09/17/22  Yes Isla Pence, MD  HYDROcodone-acetaminophen (NORCO/VICODIN) 5-325 MG tablet Take 1 tablet by mouth every 4 (four) hours as needed. 09/17/22  Yes Isla Pence, MD  acetaminophen (TYLENOL) 500 MG tablet Take 1,000 mg by mouth every 6 (six) hours as needed for mild pain or moderate pain.    [provider]  Alum Hydroxide-Mag Carbonate (GAVISCON EXTRA STRENGTH) 160-105 MG CHEW Chew 2 tablets by mouth daily as needed (upset stomach). 12/06/12   [provider]  apixaban (ELIQUIS) 5 MG TABS tablet Take 1 tablet (5 mg total) by mouth 2 (two) times daily. 07/07/22   Sherren Mocha, MD  betamethasone valerate ointment (VALISONE) 0.1 % Apply 1 Application topically 2 (two) times daily as needed (itching).    [provider]  Cholecalciferol 50 MCG (2000 UT) CAPS Take 4,000 Units by mouth daily.    [provider]  diazepam (VALIUM) 10 MG tablet TAKE ONE TABLET EVERY 12 HOURS AS NEEDED FOR ANXIETY Patient taking differently: Take 10 mg by mouth every 12 (twelve) hours as needed for anxiety. 05/12/22   Tawnya Crook, MD  gabapentin (NEURONTIN) 600 MG  tablet TAKE ONE TABLET THREE TIMES DAILY 04/29/22   Tawnya Crook, MD  hydrOXYzine (VISTARIL) 25 MG capsule Take 2 capsules (50 mg total) by mouth every 6 (six) hours. 02/11/22   Tawnya Crook, MD  mirabegron ER (MYRBETRIQ) 50 MG TB24 tablet Take 1 tablet (50 mg total) by mouth daily. One po qd 08/05/22   Megan Salon, MD  omeprazole (PRILOSEC) 40 MG capsule TAKE ONE CAPSULE EACH DAY Patient taking differently: Take 40 mg by mouth daily. 05/21/22   Irene Shipper, MD  ondansetron (ZOFRAN-ODT) 8 MG disintegrating tablet TAKE ONE TABLET EVERY EIGHT HOURS AS NEEDED FOR NAUSEA AND VOMITING 07/03/22   Irene Shipper, MD  Polyethyl Glyc-Propyl Glyc PF (SYSTANE HYDRATION PF) 0.4-0.3 % SOLN Place 1 drop into both eyes daily as needed (dry eyes).    [provider]  Pramoxine HCl (CERAVE ITCH RELIEF) 1 % CREA Apply 1 Application topically daily as needed (itching).    [provider]  Probiotic Product (ALIGN PO) Take 1 capsule by mouth daily.    [provider]  Pumpkin Seed-Soy Germ (AZO BLADDER CONTROL/GO-LESS PO) Take 1 capsule by mouth in the morning, at noon, and at bedtime.    [provider]  sodium chloride 1 g tablet Take 1 g by mouth daily.    [provider]  traMADol (ULTRAM) 50 MG tablet Take 1-2 tablets (50-100 mg total) by mouth every 6 (six) hours as needed  for moderate pain. Patient not taking: Reported on 07/07/2022 06/02/22   Armandina Gemma, MD  triamcinolone cream (KENALOG) 0.1 % Apply 1 Application topically as needed (itching). 07/10/21   [provider]  zolpidem (AMBIEN) 10 MG tablet TAKE ONE TABLET BY MOUTH AT BEDTIME AS NEEDED FOR SLEEP 08/16/22   Tawnya Crook, MD      Allergies    Flagyl [metronidazole], Iodixanol, Fentanyl, Cipro [ciprofloxacin hcl], and Dilaudid [hydromorphone hcl]    Review of Systems   Review of Systems  HENT:  Positive for nosebleeds.   All other systems reviewed and are negative.   Physical  Exam Updated Vital Signs BP (!) 199/91 (BP Location: Right Arm)   Pulse 72   Temp 98.6 F (37 C)   Resp 18   Ht '5\' 5"'$  (1.651 m)   Wt 81.6 kg   LMP  (LMP Unknown)   SpO2 99%   BMI 29.95 kg/m  Physical Exam Vitals and nursing note reviewed.  Constitutional:      Appearance: Normal appearance.  HENT:     Head: Normocephalic and atraumatic.     Right Ear: External ear normal.     Left Ear: External ear normal.     Nose:     Comments: Bleeding from left nare    Mouth/Throat:     Mouth: Mucous membranes are moist.     Pharynx: Oropharynx is clear.  Eyes:     Extraocular Movements: Extraocular movements intact.     Conjunctiva/sclera: Conjunctivae normal.     Pupils: Pupils are equal, round, and reactive to light.  Cardiovascular:     Rate and Rhythm: Normal rate and regular rhythm.     Pulses: Normal pulses.     Heart sounds: Normal heart sounds.  Pulmonary:     Effort: Pulmonary effort is normal.     Breath sounds: Normal breath sounds.  Abdominal:     General: Abdomen is flat. Bowel sounds are normal.     Palpations: Abdomen is soft.  Musculoskeletal:        General: Normal range of motion.     Cervical back: Normal range of motion and neck supple.  Skin:    General: Skin is warm.     Capillary Refill: Capillary refill takes less than 2 seconds.  Neurological:     General: No focal deficit present.     Mental Status: She is alert and oriented to person, place, and time.  Psychiatric:        Mood and Affect: Mood normal.        Behavior: Behavior normal.     ED Results / Procedures / Treatments   Labs (all labs ordered are listed, but only abnormal results are displayed) Labs Reviewed  CBC WITH DIFFERENTIAL/PLATELET - Abnormal; Notable for the following components:      Result Value   Hemoglobin 10.4 (*)    HCT 31.5 (*)    MCV 76.5 (*)    MCH 25.2 (*)    All other components within normal limits  BASIC METABOLIC PANEL - Abnormal; Notable for the following  components:   Sodium 134 (*)    Glucose, Bld 104 (*)    All other components within normal limits  PROTIME-INR    EKG None  Radiology No results found.  Procedures .Epistaxis Management  Date/Time: 09/17/2022 10:12 PM  Performed by: Isla Pence, MD Authorized by: Isla Pence, MD   Consent:    Consent obtained:  Verbal   Consent given  by:  Patient   Risks, benefits, and alternatives were discussed: yes     Risks discussed:  Bleeding and pain   Alternatives discussed:  No treatment Universal protocol:    Procedure explained and questions answered to patient or proxy's satisfaction: yes     Patient identity confirmed:  Verbally with patient Procedure details:    Treatment site:  L anterior   Treatment method:  Silver nitrate   Treatment episode: initial   Post-procedure details:    Assessment:  No improvement   Procedure completion:  Tolerated well, no immediate complications .Epistaxis Management  Date/Time: 09/17/2022 10:12 PM  Performed by: Isla Pence, MD Authorized by: Isla Pence, MD   Consent:    Consent obtained:  Verbal   Consent given by:  Patient   Risks, benefits, and alternatives were discussed: yes     Risks discussed:  Bleeding and pain   Alternatives discussed:  No treatment Universal protocol:    Procedure explained and questions answered to patient or proxy's satisfaction: yes     Patient identity confirmed:  Verbally with patient Procedure details:    Treatment site:  L anterior   Treatment method:  Nasal balloon   Treatment episode: recurring   Post-procedure details:    Assessment:  Bleeding stopped   Procedure completion:  Tolerated well, no immediate complications     Medications Ordered in ED Medications  oxyCODONE (Oxy IR/ROXICODONE) immediate release tablet 5 mg (has no administration in time range)  cephALEXin (KEFLEX) capsule 500 mg (has no administration in time range)  silver nitrate applicators applicator 1  Stick (1 Stick Topical Given 09/17/22 2049)  acetaminophen (TYLENOL) tablet 1,000 mg (1,000 mg Oral Given 09/17/22 2128)    ED Course/ Medical Decision Making/ A&P                           Medical Decision Making Amount and/or Complexity of Data Reviewed Labs: ordered.  Risk OTC drugs. Prescription drug management.   This patient presents to the ED for concern of nosebleed, this involves an extensive number of treatment options, and is a complaint that carries with it a high risk of complications and morbidity.  The differential diagnosis includes coagulopathy, anemia   Co morbidities that complicate the patient evaluation  htn, afib (on eliquis), SIADH, GERD, peripheral neuropathy, anemia, depression, and skin cancer   Additional history obtained:  Additional history obtained from epic chart review External records from outside source obtained and reviewed including husband   Lab Tests:  I Ordered, and personally interpreted labs.  The pertinent results include:  cbc with hgb 10.4 (hgb 11.1 in Sept); INR 1.1; bmp with na 134   Medicines ordered and prescription drug management:  I ordered medication including oxy/tylenol  for pain; keflex for abx  Reevaluation of the patient after these medicines showed that the patient improved I have reviewed the patients home medicines and have made adjustments as needed   Problem List / ED Course:  Epistaxis on Eliquis:  bleeding has stopped with a rhino rocket.  Pt given pain meds and keflex.  Nasal packing to be removed in 2-3 days   Reevaluation:  After the interventions noted above, I reevaluated the patient and found that they have :improved   Social Determinants of Health:  Lives at home   Dispostion:  After consideration of the diagnostic results and the patients response to treatment, I feel that the patent would benefit from discharge with  outpatient f/u.          Final Clinical Impression(s) / ED  Diagnoses Final diagnoses:  Epistaxis    Rx / DC Orders ED Discharge Orders          Ordered    cephALEXin (KEFLEX) 500 MG capsule  3 times daily        09/17/22 2207    HYDROcodone-acetaminophen (NORCO/VICODIN) 5-325 MG tablet  Every 4 hours PRN        09/17/22 2207              Isla Pence, MD 09/17/22 2216

## 2022-09-18 ENCOUNTER — Telehealth: Payer: Self-pay | Admitting: Cardiovascular Disease

## 2022-09-18 NOTE — Telephone Encounter (Signed)
Patient stated she is on Eliquis and developed a nose bleed and went to the ER yesterday and they put a balloon up her nose.  Patient stated they stopped her Eliquis and she would like advice on next steps.

## 2022-09-18 NOTE — Telephone Encounter (Signed)
This information reviewed with Dr Johney Frame (DOD in office  today) for further instructions for pt.  Pt sent home last night after rhino rocket placed for nosebleed.  She was instructed to discontinue her Eliquis which she takes for At Fib.  Pt is advised by Dr Johney Frame to f/u with her PCP tomorrow or ENT, if she has one for removal.  Once it is know the bleeding has stopped, pt is to re-start her Eliquis at previous dosing schedule.  Pt is currently attempting to schedule with ENT.  She is aware of orders however states she does not feel comfortable having rhino rocket removed before the weekend especially with it being a holiday weekend.  She is concerned her nose will start bleeding again.  Pt would like Dr Antionette Char review and recommendations on removal and when to restart Eliquis.  Advised he is working in the hospital today and I will forward this information to him but am not sure when he will be reviewing it.  Advised once with have heard back from him she will be contacted with any new orders/instructions.

## 2022-09-18 NOTE — Telephone Encounter (Signed)
Patient notified.  I asked her to have ENT send notes to Dr Burt Knack after her visit.

## 2022-09-18 NOTE — Telephone Encounter (Signed)
I'd be fine with her holding Eliquis through the weekend. Would follow-up with her PCP and/or ENT as Dr Johney Frame recommended. May need to address long term anticoagulation as it appears she isn't going to tolerate this well.

## 2022-09-19 ENCOUNTER — Encounter: Payer: Self-pay | Admitting: Family Medicine

## 2022-09-23 DIAGNOSIS — Z7901 Long term (current) use of anticoagulants: Secondary | ICD-10-CM | POA: Diagnosis not present

## 2022-09-23 DIAGNOSIS — R040 Epistaxis: Secondary | ICD-10-CM | POA: Diagnosis not present

## 2022-09-24 ENCOUNTER — Other Ambulatory Visit: Payer: Self-pay | Admitting: Family Medicine

## 2022-09-25 NOTE — Telephone Encounter (Signed)
Patient stated that she did request medication.

## 2022-09-25 NOTE — Telephone Encounter (Signed)
Left message to return call 

## 2022-09-29 ENCOUNTER — Other Ambulatory Visit: Payer: Self-pay | Admitting: Family Medicine

## 2022-09-29 ENCOUNTER — Ambulatory Visit (INDEPENDENT_AMBULATORY_CARE_PROVIDER_SITE_OTHER): Payer: Medicare Other

## 2022-09-29 DIAGNOSIS — I480 Paroxysmal atrial fibrillation: Secondary | ICD-10-CM | POA: Diagnosis not present

## 2022-09-30 DIAGNOSIS — H52202 Unspecified astigmatism, left eye: Secondary | ICD-10-CM | POA: Diagnosis not present

## 2022-09-30 DIAGNOSIS — H25042 Posterior subcapsular polar age-related cataract, left eye: Secondary | ICD-10-CM | POA: Diagnosis not present

## 2022-09-30 DIAGNOSIS — Z961 Presence of intraocular lens: Secondary | ICD-10-CM | POA: Diagnosis not present

## 2022-09-30 DIAGNOSIS — H25812 Combined forms of age-related cataract, left eye: Secondary | ICD-10-CM | POA: Diagnosis not present

## 2022-09-30 DIAGNOSIS — H269 Unspecified cataract: Secondary | ICD-10-CM | POA: Diagnosis not present

## 2022-09-30 DIAGNOSIS — H2512 Age-related nuclear cataract, left eye: Secondary | ICD-10-CM | POA: Diagnosis not present

## 2022-09-30 LAB — CUP PACEART REMOTE DEVICE CHECK
Date Time Interrogation Session: 20240107232203
Implantable Pulse Generator Implant Date: 20230614

## 2022-10-03 NOTE — Progress Notes (Signed)
[facility] Summary Report / Loop Recorder

## 2022-10-07 DIAGNOSIS — L570 Actinic keratosis: Secondary | ICD-10-CM | POA: Diagnosis not present

## 2022-10-07 DIAGNOSIS — L299 Pruritus, unspecified: Secondary | ICD-10-CM | POA: Diagnosis not present

## 2022-10-07 DIAGNOSIS — D492 Neoplasm of unspecified behavior of bone, soft tissue, and skin: Secondary | ICD-10-CM | POA: Diagnosis not present

## 2022-10-07 DIAGNOSIS — L659 Nonscarring hair loss, unspecified: Secondary | ICD-10-CM | POA: Diagnosis not present

## 2022-10-07 DIAGNOSIS — D485 Neoplasm of uncertain behavior of skin: Secondary | ICD-10-CM | POA: Diagnosis not present

## 2022-10-14 ENCOUNTER — Other Ambulatory Visit: Payer: Self-pay | Admitting: Obstetrics & Gynecology

## 2022-10-14 ENCOUNTER — Ambulatory Visit
Admission: RE | Admit: 2022-10-14 | Discharge: 2022-10-14 | Disposition: A | Discharge status: Home / Self Care | Payer: Medicare Other | Source: Ambulatory Visit | Attending: Obstetrics & Gynecology | Admitting: Obstetrics & Gynecology

## 2022-10-14 DIAGNOSIS — Z1231 Encounter for screening mammogram for malignant neoplasm of breast: Secondary | ICD-10-CM | POA: Diagnosis not present

## 2022-10-14 LAB — HM MAMMOGRAPHY

## 2022-10-17 ENCOUNTER — Encounter: Payer: Self-pay | Admitting: Family Medicine

## 2022-10-24 ENCOUNTER — Other Ambulatory Visit: Payer: Self-pay | Admitting: Family Medicine

## 2022-10-27 ENCOUNTER — Other Ambulatory Visit (HOSPITAL_BASED_OUTPATIENT_CLINIC_OR_DEPARTMENT_OTHER): Payer: Self-pay | Admitting: Obstetrics & Gynecology

## 2022-10-31 ENCOUNTER — Other Ambulatory Visit: Payer: Self-pay | Admitting: Family Medicine

## 2022-11-03 ENCOUNTER — Ambulatory Visit: Payer: Medicare Other

## 2022-11-03 DIAGNOSIS — I480 Paroxysmal atrial fibrillation: Secondary | ICD-10-CM | POA: Diagnosis not present

## 2022-11-04 LAB — CUP PACEART REMOTE DEVICE CHECK
Date Time Interrogation Session: 20240209231301
Implantable Pulse Generator Implant Date: 20230614

## 2022-11-07 ENCOUNTER — Other Ambulatory Visit: Payer: Self-pay | Admitting: Internal Medicine

## 2022-11-10 ENCOUNTER — Encounter: Payer: Self-pay | Admitting: *Deleted

## 2022-11-11 NOTE — Progress Notes (Signed)
Carelink Summary Report / Loop Recorder 

## 2022-11-27 ENCOUNTER — Ambulatory Visit: Payer: Medicare Other | Admitting: Physical Therapy

## 2022-11-29 ENCOUNTER — Other Ambulatory Visit: Payer: Self-pay | Admitting: Family Medicine

## 2022-11-29 NOTE — Telephone Encounter (Signed)
Nees appt within 30 days

## 2022-12-01 ENCOUNTER — Other Ambulatory Visit (HOSPITAL_BASED_OUTPATIENT_CLINIC_OR_DEPARTMENT_OTHER): Payer: Self-pay | Admitting: Obstetrics & Gynecology

## 2022-12-08 ENCOUNTER — Ambulatory Visit (INDEPENDENT_AMBULATORY_CARE_PROVIDER_SITE_OTHER): Payer: Medicare Other

## 2022-12-08 DIAGNOSIS — I480 Paroxysmal atrial fibrillation: Secondary | ICD-10-CM

## 2022-12-10 LAB — CUP PACEART REMOTE DEVICE CHECK
Date Time Interrogation Session: 20240317232035
Implantable Pulse Generator Implant Date: 20230614

## 2022-12-12 ENCOUNTER — Other Ambulatory Visit (HOSPITAL_BASED_OUTPATIENT_CLINIC_OR_DEPARTMENT_OTHER): Payer: Self-pay | Admitting: Obstetrics & Gynecology

## 2022-12-12 DIAGNOSIS — D251 Intramural leiomyoma of uterus: Secondary | ICD-10-CM

## 2022-12-16 ENCOUNTER — Encounter (HOSPITAL_BASED_OUTPATIENT_CLINIC_OR_DEPARTMENT_OTHER): Payer: Self-pay | Admitting: Obstetrics & Gynecology

## 2022-12-18 ENCOUNTER — Ambulatory Visit: Payer: Medicare Other | Admitting: Internal Medicine

## 2022-12-18 NOTE — Progress Notes (Signed)
Carelink Summary Report / Loop Recorder 

## 2022-12-22 ENCOUNTER — Ambulatory Visit: Payer: Medicare Other | Admitting: Family Medicine

## 2022-12-30 ENCOUNTER — Other Ambulatory Visit: Payer: Self-pay | Admitting: Family Medicine

## 2022-12-30 ENCOUNTER — Other Ambulatory Visit (HOSPITAL_BASED_OUTPATIENT_CLINIC_OR_DEPARTMENT_OTHER): Payer: Self-pay | Admitting: Obstetrics & Gynecology

## 2022-12-31 ENCOUNTER — Ambulatory Visit: Payer: Medicare Other | Admitting: Family Medicine

## 2023-01-02 ENCOUNTER — Other Ambulatory Visit: Payer: Self-pay | Admitting: Family Medicine

## 2023-01-02 NOTE — Telephone Encounter (Signed)
Patient states pharmacy tried requesting refill last week. Pt would like medication by the weekend if possible!

## 2023-01-06 ENCOUNTER — Other Ambulatory Visit: Payer: Self-pay | Admitting: Family Medicine

## 2023-01-07 ENCOUNTER — Encounter: Payer: Self-pay | Admitting: Family Medicine

## 2023-01-07 ENCOUNTER — Ambulatory Visit (INDEPENDENT_AMBULATORY_CARE_PROVIDER_SITE_OTHER): Payer: Medicare Other | Admitting: Family Medicine

## 2023-01-07 VITALS — BP 130/76 | HR 73 | Temp 98.1°F | Ht 65.0 in

## 2023-01-07 DIAGNOSIS — R300 Dysuria: Secondary | ICD-10-CM

## 2023-01-07 DIAGNOSIS — L299 Pruritus, unspecified: Secondary | ICD-10-CM | POA: Diagnosis not present

## 2023-01-07 DIAGNOSIS — E222 Syndrome of inappropriate secretion of antidiuretic hormone: Secondary | ICD-10-CM | POA: Diagnosis not present

## 2023-01-07 DIAGNOSIS — I4891 Unspecified atrial fibrillation: Secondary | ICD-10-CM | POA: Diagnosis not present

## 2023-01-07 DIAGNOSIS — F5101 Primary insomnia: Secondary | ICD-10-CM | POA: Diagnosis not present

## 2023-01-07 DIAGNOSIS — R269 Unspecified abnormalities of gait and mobility: Secondary | ICD-10-CM

## 2023-01-07 DIAGNOSIS — Z79899 Other long term (current) drug therapy: Secondary | ICD-10-CM | POA: Diagnosis not present

## 2023-01-07 DIAGNOSIS — G609 Hereditary and idiopathic neuropathy, unspecified: Secondary | ICD-10-CM

## 2023-01-07 DIAGNOSIS — F419 Anxiety disorder, unspecified: Secondary | ICD-10-CM

## 2023-01-07 DIAGNOSIS — F339 Major depressive disorder, recurrent, unspecified: Secondary | ICD-10-CM

## 2023-01-07 DIAGNOSIS — Z23 Encounter for immunization: Secondary | ICD-10-CM | POA: Diagnosis not present

## 2023-01-07 NOTE — Patient Instructions (Signed)

## 2023-01-07 NOTE — Progress Notes (Unsigned)
Subjective:     Patient ID: Katrina Benitez, female    DOB: 12/20/1945, 77 y.o.   MRN: [id number]  Chief Complaint  Patient presents with   Medication Refill    Need medication refills   Burning with urination    HPI  Anxiety-a lot of "meltdowns".  Has been on mult medications and not help.  Takes Valium 10 mg daily to three times daily.  No SI.  "Takes high dose of medications to sedate her for procedures" so higher doses of medications to work for her. Overwhelmed a lot. Upset about so many doctor appointments, etc.  Insomnia-occasional takes  Ambien per patient, not nightly and not necessarily weekly. Issues since childhood.  Atrial fibrillation-rate increased >100 frequently. Dizziness at times.bleeds easily.  Taking eliquis Neuropathy-feet pain from fall several years ago.  Doesn't drive. Needs PT -was ordered but patient never heard-upset that I didn't order it(advised I did).  Taking gabapentin  three times daily-helps some Itching-chronic-adult pruritus unknown origin from Derm.  Takes vistaril 50 mg 4x/day.  No excessive sedation. Upset about weight-has history of gastroparesis. Doesn't eat much  There are no preventive care reminders to display for this patient.  Past Medical History:  Diagnosis Date   Allergy    Anemia    Anxiety    Arthritis    degenerative disc disorder   Cancer    skin cancer   Cardiac arrhythmia    Cataract    surgery in right eye in 03/2022   Depression    Diverticulitis    Diverticulosis    GERD (gastroesophageal reflux disease)    Heart murmur    HTN (hypertension)    not on meds in 2 years per pt on 05/19/22 n   Infertility, female    Neuromuscular disorder    idiopathic peripheral  neuropathy   Peripheral neuropathy    Positive PPD    SIADH (syndrome of inappropriate ADH production)     Past Surgical History:  Procedure Laterality Date   BREAST BIOPSY Left    BREAST EXCISIONAL BIOPSY Left    benign   CATARACT EXTRACTION  Right 2023   CERVICAL BIOPSY  W/ LOOP ELECTRODE EXCISION     CERVICAL POLYPECTOMY  02/2016   CHOLECYSTECTOMY N/A 06/02/2022   Procedure: LAPAROSCOPIC CHOLECYSTECTOMY WITH INTRAOPERATIVE CHOLANGIOGRAM;  Surgeon: Darnell Level, MD;  Location: [facility];  Service: General;  Laterality: N/A;   COLONOSCOPY     COLPOSCOPY     DILATION AND CURETTAGE OF UTERUS     HYSTEROSCOPY     LOOP RECORDER IMPLANT     LUMBAR DISC SURGERY     LUMBAR LAMINECTOMY     TONSILLECTOMY     UPPER GASTROINTESTINAL ENDOSCOPY       Current Outpatient Medications:    acetaminophen (TYLENOL) 500 MG tablet, Take 1,000 mg by mouth every 6 (six) hours as needed for mild pain or moderate pain., Disp: , Rfl:    Alum Hydroxide-Mag Carbonate (GAVISCON EXTRA STRENGTH) 160-105 MG CHEW, Chew 2 tablets by mouth daily as needed (upset stomach)., Disp: , Rfl:    apixaban (ELIQUIS) 5 MG TABS tablet, Take 1 tablet (5 mg total) by mouth 2 (two) times daily., Disp: 60 tablet, Rfl: 11   betamethasone valerate (VALISONE) 0.1 % cream, APPLY TWICE DAILY, Disp: 45 g, Rfl: 0   betamethasone valerate ointment (VALISONE) 0.1 %, Apply 1 Application topically 2 (two) times daily as needed (itching)., Disp: , Rfl:    Cholecalciferol 50 MCG (2000  UT) CAPS, Take 4,000 Units by mouth daily., Disp: , Rfl:    diazepam (VALIUM) 10 MG tablet, TAKE ONE TABLET BY MOUTH EVERY TWELVE HOURS AS NEEDED FOR ANXIETY, Disp: 60 tablet, Rfl: 0   gabapentin (NEURONTIN) 600 MG tablet, TAKE ONE TABLET THREE TIMES DAILY, Disp: 270 tablet, Rfl: 1   hydrOXYzine (VISTARIL) 25 MG capsule, TAKE TWO CAPSULES EVERY 6 HOURS (Patient taking differently: Two capsules 4 times daily), Disp: 720 capsule, Rfl: 0   MYRBETRIQ 50 MG TB24 tablet, TAKE ONE TABLET DAILY, Disp: 30 tablet, Rfl: 0   omeprazole (PRILOSEC) 40 MG capsule, TAKE ONE CAPSULE EACH DAY, Disp: 90 capsule, Rfl: 1   ondansetron (ZOFRAN-ODT) 8 MG disintegrating tablet, TAKE ONE TABLET EVERY EIGHT HOURS AS NEEDED FOR NAUSEA  AND VOMITING, Disp: 20 tablet, Rfl: 1   Polyethyl Glyc-Propyl Glyc PF (SYSTANE HYDRATION PF) 0.4-0.3 % SOLN, Place 1 drop into both eyes daily as needed (dry eyes)., Disp: , Rfl:    Pramoxine HCl (CERAVE ITCH RELIEF) 1 % CREA, Apply 1 Application topically daily as needed (itching)., Disp: , Rfl:    Probiotic Product (ALIGN PO), Take 1 capsule by mouth daily., Disp: , Rfl:    Pumpkin Seed-Soy Germ (AZO BLADDER CONTROL/GO-LESS PO), Take 1 capsule by mouth in the morning, at noon, and at bedtime., Disp: , Rfl:    sodium chloride 1 g tablet, Take 1 g by mouth daily., Disp: , Rfl:    tacrolimus (PROTOPIC) 0.1 % ointment, Apply topically 2 (two) times daily., Disp: , Rfl:    tazarotene (AVAGE) 0.1 % cream, Apply topically., Disp: , Rfl:    triamcinolone cream (KENALOG) 0.1 %, Apply 1 Application topically as needed (itching)., Disp: , Rfl:    zolpidem (AMBIEN) 10 MG tablet, TAKE ONE TABLET BY MOUTH AT BEDTIME AS NEEDED FOR SLEEP, Disp: 30 tablet, Rfl: 0  Allergies  Allergen Reactions   Flagyl [Metronidazole] Hives   Hydromorphone Hives   Iodixanol Other (See Comments) and Swelling    Throat swelling  Other Reaction(s): Other (See Comments)  Throat swelling, Throat swelling, Throat swelling, Other reaction(s): Other, Other (See Comments), Throat swelling, Throat swelling, Throat swelling, Throat swelling, Throat swelling   Fentanyl Itching   Cipro [Ciprofloxacin Hcl] Hives   Dilaudid [Hydromorphone Hcl] Hives   ROS neg/noncontributory except as noted HPI/below.  Constipation.  When patient got to lab-mentioned dysuria      Objective:     BP 130/76   Pulse 73   Temp 98.1 F (36.7 C) (Temporal)   Ht  (1.651 m)   LMP  (LMP Unknown)   SpO2 97%   BMI 29.95 kg/m  Wt Readings from Last 3 Encounters:  09/17/22 180 lb (81.6 kg)  07/07/22 180 lb (81.6 kg)  06/02/22 178 lb 9.2 oz (81 kg)    Physical Exam   Gen: WDWN NAD HEENT: NCAT, conjunctiva not injected, sclera  nonicteric NECK:  supple, no thyromegaly, no nodes, no carotid bruits CARDIAC: RRR, S1S2+, no murmur. DP 2+B LUNGS: CTAB. No wheezes ABDOMEN:  BS+, soft, NTND, No HSM, no masses EXT:  no edema MSK: no gross abnormalities.  NEURO: A&O x3.  CN II-XII intact.  PSYCH: anxious, overwhelmed.  Good eye contact  spent-reviewing chart, listening to her, orders, etc.      Assessment & Plan:  Atrial fibrillation with RVR Assessment & Plan: Chronic.  Rate mostly controlled.  Occasionally she gets rates over 100.  Does have a loop recorder in place.  On Eliquis  5 mg twice daily.  Care per cardiology.  Will check CBC, CMP, TSH, lipids  Orders: -     CBC with Differential/Platelet -     Comprehensive metabolic panel -     TSH -     Lipid panel  Depression, recurrent  Primary insomnia Assessment & Plan: -Chronic.  Fair control.  Mostly takes Valium for sleep, however "occasionally" uses Ambien 10 mg.  Again I have voiced my concerns with her combos of medications.   Idiopathic peripheral neuropathy Assessment & Plan: Chronic.  Pain is fairly well-controlled with gabapentin 600 mg 3 times daily.  Continue.   SIADH (syndrome of inappropriate ADH production) Assessment & Plan: Chronic.  Follow BMP monthly.  Patient is very upset that there is no standing order.  Advised that we have placed standing orders in the past.  Not sure what happened.  Will place orders again   Pruritus Assessment & Plan: Chronic.  Not well-controlled, but patient is okay with her regiment.  She takes hydroxyzine 50 mg 4 times daily.  Again voiced my concern with oversedation, however she states that is not a problem for her.   Gait abnormality Assessment & Plan: Gait abnormality-partly due to idiopathic neuropathy.  I had put in a referral several months ago, however patient never heard.  I placed the referral again.  She wanted to do in [place] when she does her lymphedema therapy.  Physical therapy  actually called her while we were still in the room together.  Orders: -     Ambulatory referral to Physical Therapy  High risk medication use -     CBC with Differential/Platelet -     Hemoglobin A1c -     Comprehensive metabolic panel -     TSH -     Urine drugs of abuse scrn w alc, routine (Ref Lab) -     Lipid panel  Burning with urination -     Urine Culture -     Urinalysis, Routine w reflex microscopic  Anxiety Assessment & Plan: Chronic.  Not well-controlled.  She has failed multiple SSRIs, BuSpar.  Currently taking Valium 10 mg up to 3 times daily.  Discussed my concerns with these higher doses and her age and frequency.  She firmly discussed her opinion that this is the type of dose that she needs and she is fine.  PDMP was checked.  Will check UDS.  Follow-up 6 months   Follow up 40m  Angelena Sole, MD

## 2023-01-08 DIAGNOSIS — Z79899 Other long term (current) drug therapy: Secondary | ICD-10-CM | POA: Insufficient documentation

## 2023-01-08 LAB — URINALYSIS, ROUTINE W REFLEX MICROSCOPIC
Bilirubin Urine: NEGATIVE
Hgb urine dipstick: NEGATIVE
Ketones, ur: NEGATIVE
Leukocytes,Ua: NEGATIVE
Nitrite: NEGATIVE
RBC / HPF: NONE SEEN (ref 0–?)
Specific Gravity, Urine: 1.01 (ref 1.000–1.030)
Total Protein, Urine: NEGATIVE
Urine Glucose: NEGATIVE
Urobilinogen, UA: 0.2 (ref 0.0–1.0)
pH: 6.5 (ref 5.0–8.0)

## 2023-01-08 LAB — CBC WITH DIFFERENTIAL/PLATELET
Basophils Absolute: 0.1 10*3/uL (ref 0.0–0.1)
Basophils Relative: 1.9 % (ref 0.0–3.0)
Eosinophils Absolute: 0.3 10*3/uL (ref 0.0–0.7)
Eosinophils Relative: 5 % (ref 0.0–5.0)
HCT: 34.9 % — ABNORMAL LOW (ref 36.0–46.0)
Hemoglobin: 11.5 g/dL — ABNORMAL LOW (ref 12.0–15.0)
Lymphocytes Relative: 24.3 % (ref 12.0–46.0)
Lymphs Abs: 1.3 10*3/uL (ref 0.7–4.0)
MCHC: 32.8 g/dL (ref 30.0–36.0)
MCV: 76.8 fl — ABNORMAL LOW (ref 78.0–100.0)
Monocytes Absolute: 0.5 10*3/uL (ref 0.1–1.0)
Monocytes Relative: 9.7 % (ref 3.0–12.0)
Neutro Abs: 3.3 10*3/uL (ref 1.4–7.7)
Neutrophils Relative %: 59.1 % (ref 43.0–77.0)
Platelets: 380 10*3/uL (ref 150.0–400.0)
RBC: 4.54 Mil/uL (ref 3.87–5.11)
RDW: 15 % (ref 11.5–15.5)
WBC: 5.5 10*3/uL (ref 4.0–10.5)

## 2023-01-08 LAB — URINE CULTURE
MICRO NUMBER:: 14837408
SPECIMEN QUALITY:: ADEQUATE

## 2023-01-08 LAB — COMPREHENSIVE METABOLIC PANEL
ALT: 12 U/L (ref 0–35)
AST: 17 U/L (ref 0–37)
Albumin: 4.3 g/dL (ref 3.5–5.2)
Alkaline Phosphatase: 134 U/L — ABNORMAL HIGH (ref 39–117)
BUN: 17 mg/dL (ref 6–23)
CO2: 28 mEq/L (ref 19–32)
Calcium: 9.3 mg/dL (ref 8.4–10.5)
Chloride: 98 mEq/L (ref 96–112)
Creatinine, Ser: 1.02 mg/dL (ref 0.40–1.20)
GFR: 53.36 mL/min — ABNORMAL LOW (ref >60.00)
Glucose, Bld: 88 mg/dL (ref 70–99)
Potassium: 4.7 mEq/L (ref 3.5–5.1)
Sodium: 135 mEq/L (ref 135–145)
Total Bilirubin: 0.5 mg/dL (ref 0.2–1.2)
Total Protein: 6.7 g/dL (ref 6.0–8.3)

## 2023-01-08 LAB — LIPID PANEL
Cholesterol: 228 mg/dL — ABNORMAL HIGH (ref 0–200)
HDL: 101.4 mg/dL (ref >39.00)
LDL Cholesterol: 112 mg/dL — ABNORMAL HIGH (ref 0–99)
NonHDL: 126.94
Total CHOL/HDL Ratio: 2
Triglycerides: 76 mg/dL (ref 0.0–149.0)
VLDL: 15.2 mg/dL (ref 0.0–40.0)

## 2023-01-08 LAB — HEMOGLOBIN A1C: Hgb A1c MFr Bld: 5.9 % (ref 4.6–6.5)

## 2023-01-08 LAB — TSH: TSH: 1.35 u[IU]/mL (ref 0.35–5.50)

## 2023-01-08 MED ORDER — HYDROXYZINE PAMOATE 25 MG PO CAPS: ORAL_CAPSULE | ORAL | 1 refills | Status: DC

## 2023-01-08 MED ORDER — DIAZEPAM 10 MG PO TABS: ORAL_TABLET | ORAL | 4 refills | Status: DC

## 2023-01-08 NOTE — Progress Notes (Signed)
1.  Labs stable mostly 2.  A1C(3 month average of sugars) is elevated.  This is considered PreDiabetes.  Work on diet-decrease sugars and starches and aim for 30 minutes of exercise 5 days/week to prevent progression to diabetes  3.  Urine has a few renal cells and it-repeat UA with micro in 1 month when she does her BMP

## 2023-01-08 NOTE — Assessment & Plan Note (Signed)
Chronic.  Rate mostly controlled.  Occasionally she gets rates over 100.  Does have a loop recorder in place.  On Eliquis 5 mg twice daily.  Care per cardiology.  Will check CBC, CMP, TSH, lipids

## 2023-01-08 NOTE — Assessment & Plan Note (Signed)
Chronic.  Follow BMP monthly.  Patient is very upset that there is no standing order.  Advised that we have placed standing orders in the past.  Not sure what happened.  Will place orders again

## 2023-01-08 NOTE — Assessment & Plan Note (Signed)
-  Chronic.  Fair control.  Mostly takes Valium for sleep, however "occasionally" uses Ambien 10 mg.  Again I have voiced my concerns with her combos of medications.

## 2023-01-08 NOTE — Assessment & Plan Note (Signed)
Chronic.  Pain is fairly well-controlled with gabapentin 600 mg 3 times daily.  Continue.

## 2023-01-08 NOTE — Assessment & Plan Note (Signed)
Chronic.  Not well-controlled, but patient is okay with her regiment.  She takes hydroxyzine 50 mg 4 times daily.  Again voiced my concern with oversedation, however she states that is not a problem for her.

## 2023-01-08 NOTE — Assessment & Plan Note (Signed)
Gait abnormality-partly due to idiopathic neuropathy.  I had put in a referral several months ago, however patient never heard.  I placed the referral again.  She wanted to do in [place] when she does her lymphedema therapy.  Physical therapy actually called her while we were still in the room together.

## 2023-01-08 NOTE — Assessment & Plan Note (Addendum)
Chronic.  Not well-controlled.  She has failed multiple SSRIs, BuSpar.  Currently taking Valium 10 mg up to 3 times daily.  Discussed my concerns with these higher doses and her age and frequency.  She firmly discussed her opinion that this is the type of dose that she needs and she is fine.  PDMP was checked.  Will check UDS.  Follow-up 6 months

## 2023-01-12 ENCOUNTER — Ambulatory Visit (INDEPENDENT_AMBULATORY_CARE_PROVIDER_SITE_OTHER): Payer: Medicare Other

## 2023-01-12 DIAGNOSIS — I480 Paroxysmal atrial fibrillation: Secondary | ICD-10-CM | POA: Diagnosis not present

## 2023-01-13 ENCOUNTER — Ambulatory Visit: Payer: Medicare Other | Admitting: Family Medicine

## 2023-01-13 ENCOUNTER — Encounter: Payer: Self-pay | Admitting: Family Medicine

## 2023-01-13 LAB — URINE DRUGS OF ABUSE SCREEN W ALC, ROUTINE (REF LAB)
Amphetamines, Urine: NEGATIVE ng/mL
Barbiturate Quant, Ur: NEGATIVE ng/mL
Cannabinoid Quant, Ur: NEGATIVE ng/mL
Cocaine (Metab.): NEGATIVE ng/mL
Ethanol, Urine: NEGATIVE %
Methadone Screen, Urine: NEGATIVE ng/mL
Opiate Quant, Ur: NEGATIVE ng/mL
PCP Quant, Ur: NEGATIVE ng/mL
Propoxyphene: NEGATIVE ng/mL

## 2023-01-13 LAB — DRUG PROFILE 799031
BENZODIAZEPINES: POSITIVE — AB
HYDROXYALPRAZOLAM: NEGATIVE
NORDIAZEPAM GC/MS CONF: 575 ng/mL
NORDIAZEPAM: POSITIVE — AB
Oxazepam GC/MS Conf: 2536 ng/mL
Oxazepam: POSITIVE — AB

## 2023-01-13 LAB — CUP PACEART REMOTE DEVICE CHECK
Date Time Interrogation Session: 20240419230602
Implantable Pulse Generator Implant Date: 20230614

## 2023-01-15 ENCOUNTER — Encounter: Payer: Self-pay | Admitting: Internal Medicine

## 2023-01-19 ENCOUNTER — Ambulatory Visit: Payer: Medicare Other | Admitting: Family Medicine

## 2023-01-22 NOTE — Progress Notes (Signed)
Carelink Summary Report / Loop Recorder 

## 2023-01-27 ENCOUNTER — Ambulatory Visit (HOSPITAL_BASED_OUTPATIENT_CLINIC_OR_DEPARTMENT_OTHER): Payer: Medicare Other

## 2023-01-28 ENCOUNTER — Other Ambulatory Visit: Payer: Self-pay

## 2023-01-28 ENCOUNTER — Ambulatory Visit (HOSPITAL_COMMUNITY): Payer: Medicare Other | Attending: Family Medicine | Admitting: Physical Therapy

## 2023-01-28 DIAGNOSIS — R2689 Other abnormalities of gait and mobility: Secondary | ICD-10-CM | POA: Diagnosis not present

## 2023-01-28 DIAGNOSIS — M6281 Muscle weakness (generalized): Secondary | ICD-10-CM | POA: Diagnosis not present

## 2023-01-28 DIAGNOSIS — I890 Lymphedema, not elsewhere classified: Secondary | ICD-10-CM | POA: Diagnosis not present

## 2023-01-28 NOTE — Therapy (Addendum)
OUTPATIENT PHYSICAL THERAPY LYMPHEDEMA EVALUATION  Patient Name: Katrina Benitez MRN: [id number] DOB:14-Jul-1946, 77 y.o., female Today's Date: 01/28/2023  END OF SESSION:  PT End of Session - 01/28/23 1622     Visit Number 1    Number of Visits 18    Date for PT Re-Evaluation 03/11/23    Authorization Type medicare/ [employer]    Progress Note Due on Visit 10    PT Start Time 1345    PT Stop Time 1530    PT Time Calculation (min) 105 min    Activity Tolerance Patient tolerated treatment well    Behavior During Therapy WFL for tasks assessed/performed             Past Medical History:  Diagnosis Date   Allergy    Anemia    Anxiety    Arthritis    degenerative disc disorder   Cancer (HCC)    skin cancer   Cardiac arrhythmia    Cataract    surgery in right eye in 03/2022   Depression    Diverticulitis    Diverticulosis    GERD (gastroesophageal reflux disease)    Heart murmur    HTN (hypertension)    not on meds in 2 years per pt on 05/19/22 n   Infertility, female    Neuromuscular disorder (HCC)    idiopathic peripheral  neuropathy   Peripheral neuropathy    Positive PPD    SIADH (syndrome of inappropriate ADH production) (HCC)    Past Surgical History:  Procedure Laterality Date   BREAST BIOPSY Left    BREAST EXCISIONAL BIOPSY Left    benign   CATARACT EXTRACTION Right 2023   CERVICAL BIOPSY  W/ LOOP ELECTRODE EXCISION     CERVICAL POLYPECTOMY  02/2016   CHOLECYSTECTOMY N/A 06/02/2022   Procedure: LAPAROSCOPIC CHOLECYSTECTOMY WITH INTRAOPERATIVE CHOLANGIOGRAM;  Surgeon: Darnell Level, MD;  Location: [facility];  Service: General;  Laterality: N/A;   COLONOSCOPY     COLPOSCOPY     DILATION AND CURETTAGE OF UTERUS     HYSTEROSCOPY     LOOP RECORDER IMPLANT     LUMBAR DISC SURGERY     LUMBAR LAMINECTOMY     TONSILLECTOMY     UPPER GASTROINTESTINAL ENDOSCOPY     Patient Active Problem List   Diagnosis Date Noted   High risk medication use 01/08/2023   Abdominal  pain, right upper quadrant 05/18/2022   Symptomatic cholelithiasis 05/18/2022   Syncope and collapse 03/05/2022   Paroxysmal atrial fibrillation (HCC) 01/27/2022   Increased risk of breast cancer 04/18/2021   Anemia 01/09/2021   Pruritus 11/14/2019   Depression, recurrent (HCC) 02/18/2019   Primary insomnia 02/18/2019   Atrial fibrillation with RVR (HCC) 01/19/2019   Hyperlipidemia 09/30/2018   Post herpetic neuralgia 08/12/2018   Idiopathic peripheral neuropathy 08/12/2018   Vitamin D deficiency 07/22/2018   Vulvar pruritus 04/16/2016   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   Opacity noted on imaging study    Chest pain 01/21/2016   SIADH (syndrome of inappropriate ADH production) (HCC) 01/21/2016   Hypertension 01/21/2016   Anxiety 01/21/2016   GERD (gastroesophageal reflux disease) 01/21/2016   Gait abnormality 12/12/2015    PCP: Jeani Sow referred pt for abnormal gait   REFERRING PROVIDER: Nahser, Deloris Ping, MD referred pt for lymphedema  REFERRING DIAG: I89.0 (ICD-10-CM) - Lymphedema Abnormal gait   THERAPY DIAG:  I89.0 (ICD-10-CM) - Lymphedema Muscle weakness   Rationale for Evaluation and Treatment: Rehabilitation  ONSET DATE: chronic with acute  exacerbation  6 months ago                                                                                                                                                                                     SUBJECTIVE STATEMENT: Pt states that 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  She does not tolerate lasix.  Three years ago she had a fall going down some steps and fx her fibula, ever since she has had a poor gait.  She has compression garments but she does not use them on a regular basis.  She has been elevating her legs everyday for over six months  and her swelling has not decreased.  She has been exercising. She states that she  can not get into her shoes.    PERTINENT HISTORY: knee pain, CVI, abnormal gait, a-fib, Rt fibular fx , 2 lumbar surgeries with chronic  low back pain.   PAIN:  Are you having pain? Yes, back we will not be addressing this at this time  No leg pain  PRECAUTIONS: Other: cellulitis  WEIGHT BEARING RESTRICTIONS: No  FALLS:  Has patient fallen in last 6 months? 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, structural   OCCUPATION: retired   LEISURE: walk more and safer  PRIOR LEVEL OF FUNCTION: Independent  PATIENT GOALS: improved gait, decrease swelling    OBJECTIVE:  COGNITION: Overall cognitive status: Within functional limits for tasks assessed   PALPATION: Increased induration   LYMPHEDEMA ASSESSMENTS:     LE LANDMARK RIGHT eval  At groin   30 cm proximal to suprapatella   20 cm proximal to suprapatella   10 cm proximal to suprapatella   At midpatella / popliteal crease 38.7  30 cm proximal to floor at lateral plantar foot 38.7  20 cm proximal to floor at lateral plantar foot 30.3  10 cm proximal to floor at lateral plantar foot 23.2  Circumference of ankle/heel 31  5 cm proximal to 1st MTP joint 21.9  Across MTP joint 22.2    (Blank rows = not tested)  FUNCTIONAL TESTS:  30 seconds chair stand test:  7 with uncontrolled descent ; 6 is poor for age and sex  2 minute walk test: 412 ft with decreased ankle motion  Single leg stance:  Rt: 10"; Lt 12"      Mm test:               Right hip abduction :  4/5  left hip abduction: 4+                      Hip extension: 3-/5      left hip extension 3/5          Gastroc/soleus:  3       left gastroc soleus 3+  TODAY'S TREATMENT:                                                                                                                              DATE: 01/28/23    PATIENT EDUCATION:  Education details: HEP  Person educated:  Patient Education method: Explanation Education comprehension: returned demonstration  HOME EXERCISE PROGRAM: Access Code: [email or web address] URL: [email or web address] Date: 01/28/2023 Prepared by: Virgina Organ  Exercises - Bridge  - 2 x daily - 7 x weekly - 1 sets - 10 reps - 3-5" hold - Sit to Stand  - 2 x daily - 7 x weekly - 1 sets - 10 reps - 3-5" hold - Standing Heel Raises  - 2 x daily - 7 x weekly - 1 sets - 10 reps - 3-5" hold As well as ankle pumps, LAQ, hip ab/adduction, marching, diaphragm breathing and diaphragm pumping   ASSESSMENT:  CLINICAL IMPRESSION: Patient is a 77 y.o. female who was seen today for physical therapy evaluation and treatment for lymphedema as well as abnormal gait.  Evaluation demonstrates increased swelling of B LE secondary to secondary lymphedema with decreased skin integrity. The pt has tried compression garments, elevation and exercise for over six months without optimal results and may benefit from a compression pump.   The pt also has decreased activity tolerance, decreased strength and decreased balance causing gait disturbance.  The pt will be seen for lymphedema therapy for both manual techniques as well as compression bandaging.  Therapist has cut the foam which is in the cabinet of the lymphedema room. Once the volume has decreased and pt is ready for compression garment the pt will be measured for a compression garment and the use of a Butler to don the garment.  At this time we will begin therapy for core/LE strengthening and balance to improve the pt gait.   OBJECTIVE IMPAIRMENTS: Abnormal gait, decreased activity tolerance, decreased balance, decreased strength, and increased edema.   ACTIVITY LIMITATIONS: stairs, dressing, and locomotion level  PARTICIPATION LIMITATIONS: shopping and community activity  PERSONAL FACTORS: Fitness, Time since onset of injury/illness/exacerbation, and 1-2 comorbidities: A-fib, back surgeries  are  also affecting patient's functional outcome.   REHAB POTENTIAL: Good  CLINICAL DECISION MAKING: Evolving/moderate complexity  EVALUATION COMPLEXITY: Moderate  GOALS: Goals reviewed with patient? No  SHORT TERM GOALS: Target date: 02/18/23  Pt to have decreased 2 cm in LE to decrease risk of cellulitis and improve ability to don shoes.  Baseline: Goal status: INITIAL  2.  PT LE mm to improve 1/2 grade to allow pt to be able to come from  sit to stand from lower levels without difficulty  Baseline:  Goal status: INITIAL  3.  Pt to be able to stand for 15 minutes without difficulty to be able to make a quick meal  Baseline:  Goal status: INITIAL    LONG TERM GOALS: Target date: 03/11/23  PT to have obtained proper fitting compression garments and to be able to don and doff I . Baseline:  Goal status: INITIAL  2.  PT to have and be using a compression pump. Baseline:  Goal status: INITIAL  3.  PT LE and core strength to be increased by one grade to be able to stand for 40 minutes to make a meal.  Baseline:  Goal status: INITIAL    PLAN:  PT FREQUENCY: 3x/week  PT DURATION: 6 weeks  PLANNED INTERVENTIONS: Therapeutic exercises, Therapeutic activity, Neuromuscular re-education, Balance training, Gait training, Patient/Family education, Self Care, Joint mobilization, Manual lymph drainage, and Compression bandaging  PLAN FOR NEXT SESSION: begin total decongestive techniques, teach abdominal isometric exercise  Virgina Organ, PT CLT [id number]  01/28/2023, 4:20 PM

## 2023-01-29 ENCOUNTER — Ambulatory Visit (HOSPITAL_BASED_OUTPATIENT_CLINIC_OR_DEPARTMENT_OTHER): Admission: RE | Admit: 2023-01-29 | Payer: Medicare Other | Source: Ambulatory Visit

## 2023-01-29 ENCOUNTER — Other Ambulatory Visit (HOSPITAL_BASED_OUTPATIENT_CLINIC_OR_DEPARTMENT_OTHER): Payer: Self-pay | Admitting: Obstetrics & Gynecology

## 2023-01-29 NOTE — Addendum Note (Signed)
Addended by: Bella Kennedy on: 01/29/2023 08:22 AM   Modules accepted: Orders

## 2023-02-02 ENCOUNTER — Ambulatory Visit (HOSPITAL_COMMUNITY): Payer: Medicare Other | Admitting: Physical Therapy

## 2023-02-02 DIAGNOSIS — I890 Lymphedema, not elsewhere classified: Secondary | ICD-10-CM

## 2023-02-02 DIAGNOSIS — M6281 Muscle weakness (generalized): Secondary | ICD-10-CM | POA: Diagnosis not present

## 2023-02-02 DIAGNOSIS — R2689 Other abnormalities of gait and mobility: Secondary | ICD-10-CM | POA: Diagnosis not present

## 2023-02-02 NOTE — Therapy (Signed)
OUTPATIENT PHYSICAL THERAPY LYMPHEDEMA TREATMENT  Patient Name: Katrina Benitez MRN: [id number] DOB:1946-06-02, 77 y.o., female Today's Date: 02/02/2023  END OF SESSION:  PT End of Session - 02/02/23 1556     Visit Number 2    Number of Visits 18    Date for PT Re-Evaluation 03/11/23    Authorization Type medicare/ [employer]    Progress Note Due on Visit 10    PT Start Time 1435    PT Stop Time 1600    PT Time Calculation (min) 85 min    Activity Tolerance Patient tolerated treatment well    Behavior During Therapy WFL for tasks assessed/performed             Past Medical History:  Diagnosis Date   Allergy    Anemia    Anxiety    Arthritis    degenerative disc disorder   Cancer (HCC)    skin cancer   Cardiac arrhythmia    Cataract    surgery in right eye in 03/2022   Depression    Diverticulitis    Diverticulosis    GERD (gastroesophageal reflux disease)    Heart murmur    HTN (hypertension)    not on meds in 2 years per pt on 05/19/22 n   Infertility, female    Neuromuscular disorder (HCC)    idiopathic peripheral  neuropathy   Peripheral neuropathy    Positive PPD    SIADH (syndrome of inappropriate ADH production) (HCC)    Past Surgical History:  Procedure Laterality Date   BREAST BIOPSY Left    BREAST EXCISIONAL BIOPSY Left    benign   CATARACT EXTRACTION Right 2023   CERVICAL BIOPSY  W/ LOOP ELECTRODE EXCISION     CERVICAL POLYPECTOMY  02/2016   CHOLECYSTECTOMY N/A 06/02/2022   Procedure: LAPAROSCOPIC CHOLECYSTECTOMY WITH INTRAOPERATIVE CHOLANGIOGRAM;  Surgeon: Darnell Level, MD;  Location: [facility];  Service: General;  Laterality: N/A;   COLONOSCOPY     COLPOSCOPY     DILATION AND CURETTAGE OF UTERUS     HYSTEROSCOPY     LOOP RECORDER IMPLANT     LUMBAR DISC SURGERY     LUMBAR LAMINECTOMY     TONSILLECTOMY     UPPER GASTROINTESTINAL ENDOSCOPY     Patient Active Problem List   Diagnosis Date Noted   High risk medication use 01/08/2023   Abdominal  pain, right upper quadrant 05/18/2022   Symptomatic cholelithiasis 05/18/2022   Syncope and collapse 03/05/2022   Paroxysmal atrial fibrillation (HCC) 01/27/2022   Increased risk of breast cancer 04/18/2021   Anemia 01/09/2021   Pruritus 11/14/2019   Depression, recurrent (HCC) 02/18/2019   Primary insomnia 02/18/2019   Atrial fibrillation with RVR (HCC) 01/19/2019   Hyperlipidemia 09/30/2018   Post herpetic neuralgia 08/12/2018   Idiopathic peripheral neuropathy 08/12/2018   Vitamin D deficiency 07/22/2018   Vulvar pruritus 04/16/2016   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   Opacity noted on imaging study    Chest pain 01/21/2016   SIADH (syndrome of inappropriate ADH production) (HCC) 01/21/2016   Hypertension 01/21/2016   Anxiety 01/21/2016   GERD (gastroesophageal reflux disease) 01/21/2016   Gait abnormality 12/12/2015    PCP: Jeani Sow referred pt for abnormal gait   REFERRING PROVIDER: Nahser, Deloris Ping, MD referred pt for lymphedema  REFERRING DIAG: I89.0 (ICD-10-CM) - Lymphedema Abnormal gait   THERAPY DIAG:  I89.0 (ICD-10-CM) - Lymphedema Muscle weakness   Rationale for Evaluation and Treatment: Rehabilitation  ONSET DATE: chronic with acute  exacerbation  6 months ago                                                                                                                                                                                     SUBJECTIVE STATEMENT:  Pt states she was unable to order the bandaging because she could not find them and doesn't understand what is written on the side for "small" (it was acually 5 m).  Pt requests assistance with this and wanted some questions answered as well.   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  She does not tolerate lasix.  Three years ago she had a fall going down some steps and fx her  fibula, ever since she has had a poor gait.  She has compression garments but she does not use them on a regular basis.  She has been elevating her legs everyday and her swelling has not decreased.  She states that she can not get into her shoes.    PERTINENT HISTORY: knee pain, CVI, abnormal gait, a-fib, Rt fibular fx , 2 lumbar surgeries with chronic  low back pain.   PAIN:  Are you having pain? Yes, back we will not be addressing this at this time  No leg pain  PRECAUTIONS: Other: cellulitis  WEIGHT BEARING RESTRICTIONS: No  FALLS:  Has patient fallen in last 6 months? 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, structural   OCCUPATION: retired   LEISURE: walk more and safer  PRIOR LEVEL OF FUNCTION: Independent  PATIENT GOALS: improved gait, decrease swelling    OBJECTIVE:  COGNITION: Overall cognitive status: Within functional limits for tasks assessed   PALPATION: Increased induration   LYMPHEDEMA ASSESSMENTS:     LE LANDMARK RIGHT eval  At groin   30 cm proximal to suprapatella   20 cm proximal to suprapatella   10 cm proximal to suprapatella   At midpatella / popliteal crease 38.7  30 cm proximal to floor at lateral plantar foot 38.7  20 cm proximal to floor at lateral plantar foot 30.3  10 cm proximal to floor at lateral plantar foot 23.2  Circumference of ankle/heel 31  5 cm proximal to 1st MTP joint 21.9  Across MTP joint 22.2    (Blank rows = not tested)  FUNCTIONAL TESTS:  30 seconds chair stand test:  7 with uncontrolled descent ; 6 is poor for age and sex  2 minute walk test: 412 ft with  decreased ankle motion  Single leg stance:  Rt: 10"; Lt 12"      Mm test:               Right hip abduction :  4/5      left hip abduction: 4+                      Hip extension: 3-/5      left hip extension 3/5          Gastroc/soleus:  3       left gastroc soleus 3+  TODAY'S  TREATMENT:                                                                                                                              DATE:  02/02/23 Ordered bandaging, answered questions, education  01/28/23    PATIENT EDUCATION:  Education details: HEP  Person educated: Patient Education method: Explanation Education comprehension: returned demonstration  HOME EXERCISE PROGRAM: Access Code: [email or web address] URL: [email or web address] Date: 01/28/2023 Prepared by: Virgina Organ  Exercises - Bridge  - 2 x daily - 7 x weekly - 1 sets - 10 reps - 3-5" hold - Sit to Stand  - 2 x daily - 7 x weekly - 1 sets - 10 reps - 3-5" hold - Standing Heel Raises  - 2 x daily - 7 x weekly - 1 sets - 10 reps - 3-5" hold As well as ankle pumps, LAQ, hip ab/adduction, marching, diaphragm breathing and diaphragm pumping   ASSESSMENT:  CLINICAL IMPRESSION: Pt request assistance with ordering short stretch off site.  This task took longer than expected but able to complete.  Pt with general questions regarding compression, using a pump and treatment to be receiving here.  Therapist was able to answer general questions following and educate on general care during and at completion of lymphatic therapy. Expect volume to decrease quickly once beginning bandaging and will then be ready for compression garment.  Unable to begin manual or core/LE strengthening and balance to improve the pt gait due to increased time to complete ordering.  Pt questioning if pump was ordered and insured her I would ask evaluating therapist when returns to work.     OBJECTIVE IMPAIRMENTS: Abnormal gait, decreased activity tolerance, decreased balance, decreased strength, and increased edema.   ACTIVITY LIMITATIONS: stairs, dressing, and locomotion level  PARTICIPATION LIMITATIONS: shopping and community activity  PERSONAL FACTORS: Fitness, Time since onset of injury/illness/exacerbation, and 1-2 comorbidities: A-fib,  back surgeries  are also affecting patient's functional outcome.   REHAB POTENTIAL: Good  CLINICAL DECISION MAKING: Evolving/moderate complexity  EVALUATION COMPLEXITY: Moderate  GOALS: Goals reviewed with patient? No  SHORT TERM GOALS: Target date: 02/18/23  Pt to have decreased 2 cm in LE to decrease risk of cellulitis and improve ability to don shoes.  Baseline: Goal status: INITIAL  2.  PT LE mm to improve 1/2 grade to allow  pt to be able to come from sit to stand from lower levels without difficulty  Baseline:  Goal status: INITIAL  3.  Pt to be able to stand for 15 minutes without difficulty to be able to make a quick meal  Baseline:  Goal status: INITIAL    LONG TERM GOALS: Target date: 03/11/23  PT to have obtained proper fitting compression garments and to be able to don and doff I . Baseline:  Goal status: INITIAL  2.  PT to have and be using a compression pump. Baseline:  Goal status: INITIAL  3.  PT LE and core strength to be increased by one grade to be able to stand for 40 minutes to make a meal.  Baseline:  Goal status: INITIAL    PLAN:  PT FREQUENCY: 3x/week  PT DURATION: 6 weeks  PLANNED INTERVENTIONS: Therapeutic exercises, Therapeutic activity, Neuromuscular re-education, Balance training, Gait training, Patient/Family education, Self Care, Joint mobilization, Manual lymph drainage, and Compression bandaging  PLAN FOR NEXT SESSION: begin total decongestive techniques, teach abdominal isometric exercise.  Begin short stretch bandaging once receives (foam is in lymph room cabinet).  Lurena Nida, PTA/CLT [facility] Ph: [phone]   Lurena Nida, PTA 02/02/2023, 4:38 PM

## 2023-02-03 ENCOUNTER — Ambulatory Visit (HOSPITAL_BASED_OUTPATIENT_CLINIC_OR_DEPARTMENT_OTHER): Payer: Medicare Other

## 2023-02-04 ENCOUNTER — Encounter (HOSPITAL_COMMUNITY): Payer: Medicare Other | Admitting: Physical Therapy

## 2023-02-06 ENCOUNTER — Encounter (HOSPITAL_COMMUNITY): Payer: Self-pay

## 2023-02-06 ENCOUNTER — Ambulatory Visit (HOSPITAL_COMMUNITY): Payer: Medicare Other

## 2023-02-06 DIAGNOSIS — R2689 Other abnormalities of gait and mobility: Secondary | ICD-10-CM

## 2023-02-06 DIAGNOSIS — M6281 Muscle weakness (generalized): Secondary | ICD-10-CM

## 2023-02-06 DIAGNOSIS — I890 Lymphedema, not elsewhere classified: Secondary | ICD-10-CM | POA: Diagnosis not present

## 2023-02-06 NOTE — Therapy (Signed)
OUTPATIENT PHYSICAL THERAPY LYMPHEDEMA TREATMENT  Patient Name: Katrina Benitez MRN: [id number] DOB:15-Feb-1946, 77 y.o., female Today's Date: 02/06/2023  END OF SESSION:  PT End of Session - 02/06/23 1601     Visit Number 3    Number of Visits 18    Date for PT Re-Evaluation 03/11/23    Authorization Type medicare/ [employer]    Progress Note Due on Visit 10    PT Start Time 1430    PT Stop Time 1600    PT Time Calculation (min) 90 min    Activity Tolerance Patient tolerated treatment well    Behavior During Therapy WFL for tasks assessed/performed              Past Medical History:  Diagnosis Date   Allergy    Anemia    Anxiety    Arthritis    degenerative disc disorder   Cancer (HCC)    skin cancer   Cardiac arrhythmia    Cataract    surgery in right eye in 03/2022   Depression    Diverticulitis    Diverticulosis    GERD (gastroesophageal reflux disease)    Heart murmur    HTN (hypertension)    not on meds in 2 years per pt on 05/19/22 n   Infertility, female    Neuromuscular disorder (HCC)    idiopathic peripheral  neuropathy   Peripheral neuropathy    Positive PPD    SIADH (syndrome of inappropriate ADH production) (HCC)    Past Surgical History:  Procedure Laterality Date   BREAST BIOPSY Left    BREAST EXCISIONAL BIOPSY Left    benign   CATARACT EXTRACTION Right 2023   CERVICAL BIOPSY  W/ LOOP ELECTRODE EXCISION     CERVICAL POLYPECTOMY  02/2016   CHOLECYSTECTOMY N/A 06/02/2022   Procedure: LAPAROSCOPIC CHOLECYSTECTOMY WITH INTRAOPERATIVE CHOLANGIOGRAM;  Surgeon: Darnell Level, MD;  Location: [facility];  Service: General;  Laterality: N/A;   COLONOSCOPY     COLPOSCOPY     DILATION AND CURETTAGE OF UTERUS     HYSTEROSCOPY     LOOP RECORDER IMPLANT     LUMBAR DISC SURGERY     LUMBAR LAMINECTOMY     TONSILLECTOMY     UPPER GASTROINTESTINAL ENDOSCOPY     Patient Active Problem List   Diagnosis Date Noted   High risk medication use 01/08/2023   Abdominal  pain, right upper quadrant 05/18/2022   Symptomatic cholelithiasis 05/18/2022   Syncope and collapse 03/05/2022   Paroxysmal atrial fibrillation (HCC) 01/27/2022   Increased risk of breast cancer 04/18/2021   Anemia 01/09/2021   Pruritus 11/14/2019   Depression, recurrent (HCC) 02/18/2019   Primary insomnia 02/18/2019   Atrial fibrillation with RVR (HCC) 01/19/2019   Hyperlipidemia 09/30/2018   Post herpetic neuralgia 08/12/2018   Idiopathic peripheral neuropathy 08/12/2018   Vitamin D deficiency 07/22/2018   Vulvar pruritus 04/16/2016   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   Opacity noted on imaging study    Chest pain 01/21/2016   SIADH (syndrome of inappropriate ADH production) (HCC) 01/21/2016   Hypertension 01/21/2016   Anxiety 01/21/2016   GERD (gastroesophageal reflux disease) 01/21/2016   Gait abnormality 12/12/2015    PCP: Jeani Sow referred pt for abnormal gait   REFERRING PROVIDER: Nahser, Deloris Ping, MD referred pt for lymphedema  REFERRING DIAG: I89.0 (ICD-10-CM) - Lymphedema Abnormal gait   THERAPY DIAG:  I89.0 (ICD-10-CM) - Lymphedema Muscle weakness   Rationale for Evaluation and Treatment: Rehabilitation  ONSET DATE: chronic with  acute exacerbation  6 months ago                                                                                                                                                                                     SUBJECTIVE STATEMENT:  Pt stated she received her short stretch bandages yesterday.  No reports of pain or new issues.    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  She does not tolerate lasix.  Three years ago she had a fall going down some steps and fx her fibula, ever since she has had a poor gait.  She has compression garments but she does not use them on a regular basis.  She has been elevating her  legs everyday and her swelling has not decreased.  She states that she can not get into her shoes.    PERTINENT HISTORY: knee pain, CVI, abnormal gait, a-fib, Rt fibular fx , 2 lumbar surgeries with chronic  low back pain.   PAIN:  Are you having pain? Yes, back we will not be addressing this at this time  No leg pain  PRECAUTIONS: Other: cellulitis  WEIGHT BEARING RESTRICTIONS: No  FALLS:  Has patient fallen in last 6 months? 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, structural   OCCUPATION: retired   LEISURE: walk more and safer  PRIOR LEVEL OF FUNCTION: Independent  PATIENT GOALS: improved gait, decrease swelling    OBJECTIVE:  COGNITION: Overall cognitive status: Within functional limits for tasks assessed   PALPATION: Increased induration   LYMPHEDEMA ASSESSMENTS:     LE LANDMARK RIGHT eval  At groin   30 cm proximal to suprapatella   20 cm proximal to suprapatella   10 cm proximal to suprapatella   At midpatella / popliteal crease 38.7  30 cm proximal to floor at lateral plantar foot 38.7  20 cm proximal to floor at lateral plantar foot 30.3  10 cm proximal to floor at lateral plantar foot 23.2  Circumference of ankle/heel 31  5 cm proximal to 1st MTP joint 21.9  Across MTP joint 22.2    (Blank rows = not tested)  FUNCTIONAL TESTS:  30 seconds chair stand test:  7 with uncontrolled descent ; 6 is poor for age and sex  2 minute walk test: 412 ft with decreased ankle motion  Single leg stance:  Rt: 10"; Lt 12"      Mm test:  Right hip abduction :  4/5      left hip abduction: 4+                      Hip extension: 3-/5      left hip extension 3/5          Gastroc/soleus:  3       left gastroc soleus 3+  TODAY'S TREATMENT:                                                                                                                              DATE:   02/06/23: Answered questions Instructed isometric abdominal sets 10x 5" paired with exalation Manual lymph decongestive with short neck, superficial and deep abdominal, BLE anterior and posterior on prone (POE) Multilayer Short stretch bandages knee high with 1/2in foam 02/02/23 Ordered bandaging, answered questions, education  01/28/23    PATIENT EDUCATION:  Education details: HEP  Person educated: Patient Education method: Explanation Education comprehension: returned demonstration  HOME EXERCISE PROGRAM: Access Code: [email or web address] URL: [email or web address] Date: 01/28/2023 Prepared by: Virgina Organ  Exercises - Bridge  - 2 x daily - 7 x weekly - 1 sets - 10 reps - 3-5" hold - Sit to Stand  - 2 x daily - 7 x weekly - 1 sets - 10 reps - 3-5" hold - Standing Heel Raises  - 2 x daily - 7 x weekly - 1 sets - 10 reps - 3-5" hold As well as ankle pumps, LAQ, hip ab/adduction, marching, diaphragm breathing and diaphragm pumping   02/06/23: - Supine Transversus Abdominis Bracing - Hands on Stomach  - 1 x daily - 7 x weekly - 3 sets - 10 reps - 5" hold ASSESSMENT:  CLINICAL IMPRESSION: Pt arrived with short stretch bandages today.  Answered questions pertaining to lymphedema care and reviewed 4 aspects.  Pt encouraged to wear baggy clothes and bigger shoes.  Manual lymphedema decongestive techniques complete anterior and posterior to address edema in LE.  Multilayer short stretch bandages applied with 1/2in foam.  Pt wishes for toes to be address in future apts.  Instructed proper wear time and to remove, re-roll prior next apt.  Also reviewed isometric abdominal strengthening benefits, given HEP printout to add to HEP.  OBJECTIVE IMPAIRMENTS: Abnormal gait, decreased activity tolerance, decreased balance, decreased strength, and increased edema.   ACTIVITY LIMITATIONS: stairs, dressing, and locomotion level  PARTICIPATION LIMITATIONS: shopping and community  activity  PERSONAL FACTORS: Fitness, Time since onset of injury/illness/exacerbation, and 1-2 comorbidities: A-fib, back surgeries  are also affecting patient's functional outcome.   REHAB POTENTIAL: Good  CLINICAL DECISION MAKING: Evolving/moderate complexity  EVALUATION COMPLEXITY: Moderate  GOALS: Goals reviewed with patient? No  SHORT TERM GOALS: Target date: 02/18/23  Pt to have decreased 2 cm in LE to decrease risk of cellulitis and improve ability to don shoes.  Baseline: Goal status: INITIAL  2.  PT LE mm to improve  1/2 grade to allow pt to be able to come from sit to stand from lower levels without difficulty  Baseline:  Goal status: INITIAL  3.  Pt to be able to stand for 15 minutes without difficulty to be able to make a quick meal  Baseline:  Goal status: INITIAL    LONG TERM GOALS: Target date: 03/11/23  PT to have obtained proper fitting compression garments and to be able to don and doff I . Baseline:  Goal status: INITIAL  2.  PT to have and be using a compression pump. Baseline:  Goal status: INITIAL  3.  PT LE and core strength to be increased by one grade to be able to stand for 40 minutes to make a meal.  Baseline:  Goal status: INITIAL    PLAN:  PT FREQUENCY: 3x/week  PT DURATION: 6 weeks  PLANNED INTERVENTIONS: Therapeutic exercises, Therapeutic activity, Neuromuscular re-education, Balance training, Gait training, Patient/Family education, Self Care, Joint mobilization, Manual lymph drainage, and Compression bandaging  PLAN FOR NEXT SESSION: begin total decongestive techniques and short stretch bandages.  Add toe wrap next session.  Measure weekly.  Becky Sax, LPTA/CLT; CBIS [phone] Juel Burrow, PTA 02/06/2023, 4:13 PM

## 2023-02-09 ENCOUNTER — Encounter (HOSPITAL_COMMUNITY): Payer: Medicare Other | Admitting: Physical Therapy

## 2023-02-11 ENCOUNTER — Encounter (HOSPITAL_COMMUNITY): Payer: Medicare Other | Admitting: Physical Therapy

## 2023-02-12 DIAGNOSIS — L578 Other skin changes due to chronic exposure to nonionizing radiation: Secondary | ICD-10-CM | POA: Diagnosis not present

## 2023-02-12 LAB — CUP PACEART REMOTE DEVICE CHECK
Date Time Interrogation Session: 20240522230732
Implantable Pulse Generator Implant Date: 20230614

## 2023-02-13 ENCOUNTER — Encounter (HOSPITAL_COMMUNITY): Payer: Medicare Other

## 2023-02-13 ENCOUNTER — Telehealth (HOSPITAL_COMMUNITY): Payer: Self-pay | Admitting: Physical Therapy

## 2023-02-13 ENCOUNTER — Telehealth (HOSPITAL_COMMUNITY): Payer: Self-pay

## 2023-02-13 ENCOUNTER — Encounter (HOSPITAL_COMMUNITY): Payer: Self-pay | Admitting: Physical Therapy

## 2023-02-13 NOTE — Telephone Encounter (Signed)
PHYSICAL THERAPY DISCHARGE SUMMARY  Visits from Start of Care: 3  Current functional level related to goals / functional outcomes: Goals have not been met due to pt only coming 3/6 visits pt states that she can not come at this time but would be able to go [facility] and requests to be transferred to this clinic.  Therapist stated that she did not feel that this clinic treats LE but she will see.    PT states if her husband gets better and is able to bring her in the future she will call back.     Remaining deficits: Pt continues to have edema as she has not completed her treatments but states she is unable to get to [facility] for treatment.    Education / Equipment: HEP, skin care, pump.    Patient agrees to discharge. Patient goals were not met. Patient is being discharged due to not returning since the last visit.   Called patient re: pt has cancelled all appointments.  PT states that she should have been warned about not being able to drive with her bandages and her husband is sick and can not drive now.   Pt states that she will call back when her husband is able to drive.    Virgina Organ, PT CLT [phone]

## 2023-02-13 NOTE — Telephone Encounter (Signed)
Called and left message requesting call back to discuss attendance with lymphedema therapy.  Becky Sax, LPTA/CLT; [place] [phone]

## 2023-02-17 ENCOUNTER — Ambulatory Visit (INDEPENDENT_AMBULATORY_CARE_PROVIDER_SITE_OTHER): Payer: Medicare Other

## 2023-02-17 DIAGNOSIS — I480 Paroxysmal atrial fibrillation: Secondary | ICD-10-CM

## 2023-02-18 ENCOUNTER — Encounter (HOSPITAL_COMMUNITY): Payer: Medicare Other

## 2023-02-18 NOTE — Progress Notes (Signed)
Carelink Summary Report / Loop Recorder 

## 2023-02-20 ENCOUNTER — Encounter (HOSPITAL_COMMUNITY): Payer: Medicare Other

## 2023-02-20 ENCOUNTER — Encounter: Payer: Self-pay | Admitting: Cardiovascular Disease

## 2023-02-23 ENCOUNTER — Encounter (HOSPITAL_COMMUNITY): Payer: Medicare Other | Admitting: Physical Therapy

## 2023-02-24 ENCOUNTER — Telehealth: Payer: Self-pay | Admitting: Cardiovascular Disease

## 2023-02-24 NOTE — Telephone Encounter (Signed)
  [place] calling, she said, she sent a SWO form for compression equipment to Dr. Excell Seltzer to sign. She said, she sent it on 05/30 and she is following up

## 2023-02-24 NOTE — Telephone Encounter (Signed)
Cindy with physical therapy states she is returning a call from Maralyn Sago, Charity fundraiser, but she is not sure what the call was regarding.

## 2023-02-24 NOTE — Telephone Encounter (Signed)
Attempted return call to [place], no answer, went to voicemail. Left message explaining that we have not received any form and she can re-send it. Not sure that [facility] will approve it to order this compression pump device, but will determine that when form is received.

## 2023-02-24 NOTE — Telephone Encounter (Signed)
Attempted to call Cindy. Phone continued to ring with no answer. See previous phone note.

## 2023-02-25 ENCOUNTER — Encounter (HOSPITAL_COMMUNITY): Payer: Medicare Other | Admitting: Physical Therapy

## 2023-02-27 ENCOUNTER — Encounter (HOSPITAL_COMMUNITY): Payer: Medicare Other

## 2023-03-02 ENCOUNTER — Encounter (HOSPITAL_COMMUNITY): Payer: Medicare Other | Admitting: Physical Therapy

## 2023-03-04 ENCOUNTER — Encounter (HOSPITAL_COMMUNITY): Payer: Medicare Other

## 2023-03-06 ENCOUNTER — Encounter (HOSPITAL_COMMUNITY): Payer: Medicare Other

## 2023-03-06 NOTE — Telephone Encounter (Signed)
[place] is following up. She states the SWO form was re-faxed on 6/11 and she would like to confirm whether it has been received. Please advise.  Phone#: [phone]

## 2023-03-09 ENCOUNTER — Encounter: Payer: Self-pay | Admitting: Family Medicine

## 2023-03-09 ENCOUNTER — Encounter (HOSPITAL_COMMUNITY): Payer: Medicare Other | Admitting: Physical Therapy

## 2023-03-10 ENCOUNTER — Ambulatory Visit (INDEPENDENT_AMBULATORY_CARE_PROVIDER_SITE_OTHER): Payer: Medicare Other

## 2023-03-10 ENCOUNTER — Other Ambulatory Visit: Payer: Self-pay | Admitting: Internal Medicine

## 2023-03-10 ENCOUNTER — Encounter: Payer: Self-pay | Admitting: Family Medicine

## 2023-03-10 VITALS — Wt 180.0 lb

## 2023-03-10 DIAGNOSIS — Z0000 Encounter for general adult medical examination without abnormal findings: Secondary | ICD-10-CM

## 2023-03-10 NOTE — Patient Instructions (Signed)
Katrina Benitez , Thank you for taking time to come for your Medicare Wellness Visit. I appreciate your ongoing commitment to your health goals. Please review the following plan we discussed and let me know if I can assist you in the future.   These are the goals we discussed:  Goals      Patient Stated     Lose weight and get back to exercise         This is a list of the screening recommended for you and due dates:  Health Maintenance  Topic Date Due   COVID-19 Vaccine (6 - 2023-24 season) 03/04/2023   Flu Shot  04/23/2023   Medicare Annual Wellness Visit  03/09/2024   DTaP/Tdap/Td vaccine (2 - Td or Tdap) 07/06/2025   Pneumonia Vaccine  Completed   DEXA scan (bone density measurement)  Completed   Hepatitis C Screening  Completed   Zoster (Shingles) Vaccine  Completed   HPV Vaccine  Aged Out   Colon Cancer Screening  Discontinued    Advanced directives: Please bring a copy of your health care power of attorney and living will to the office at your convenience.  Conditions/risks identified: stay active and healthy   Next appointment: Follow up in one year for your annual wellness visit    Preventive Care 65 Years and Older, Female Preventive care refers to lifestyle choices and visits with your health care provider that can promote health and wellness. What does preventive care include? A yearly physical exam. This is also called an annual well check. Dental exams once or twice a year. Routine eye exams. Ask your health care provider how often you should have your eyes checked. Personal lifestyle choices, including: Daily care of your teeth and gums. Regular physical activity. Eating a healthy diet. Avoiding tobacco and drug use. Limiting alcohol use. Practicing safe sex. Taking low-dose aspirin every day. Taking vitamin and mineral supplements as recommended by your health care provider. What happens during an annual well check? The services and screenings done by your  health care provider during your annual well check will depend on your age, overall health, lifestyle risk factors, and family history of disease. Counseling  Your health care provider may ask you questions about your: Alcohol use. Tobacco use. Drug use. Emotional well-being. Home and relationship well-being. Sexual activity. Eating habits. History of falls. Memory and ability to understand (cognition). Work and work Astronomer. Reproductive health. Screening  You may have the following tests or measurements: Height, weight, and BMI. Blood pressure. Lipid and cholesterol levels. These may be checked every 5 years, or more frequently if you are over 21 years old. Skin check. Lung cancer screening. You may have this screening every year starting at age 56 if you have a 30-pack-year history of smoking and currently smoke or have quit within the past 15 years. Fecal occult blood test (FOBT) of the stool. You may have this test every year starting at age 5. Flexible sigmoidoscopy or colonoscopy. You may have a sigmoidoscopy every 5 years or a colonoscopy every 10 years starting at age 23. Hepatitis C blood test. Hepatitis B blood test. Sexually transmitted disease (STD) testing. Diabetes screening. This is done by checking your blood sugar (glucose) after you have not eaten for a while (fasting). You may have this done every 1-3 years. Bone density scan. This is done to screen for osteoporosis. You may have this done starting at age 67. Mammogram. This may be done every 1-2 years. Talk to  your health care provider about how often you should have regular mammograms. Talk with your health care provider about your test results, treatment options, and if necessary, the need for more tests. Vaccines  Your health care provider may recommend certain vaccines, such as: Influenza vaccine. This is recommended every year. Tetanus, diphtheria, and acellular pertussis (Tdap, Td) vaccine. You may  need a Td booster every 10 years. Zoster vaccine. You may need this after age 76. Pneumococcal 13-valent conjugate (PCV13) vaccine. One dose is recommended after age 37. Pneumococcal polysaccharide (PPSV23) vaccine. One dose is recommended after age 66. Talk to your health care provider about which screenings and vaccines you need and how often you need them. This information is not intended to replace advice given to you by your health care provider. Make sure you discuss any questions you have with your health care provider. Document Released: 10/05/2015 Document Revised: 05/28/2016 Document Reviewed: 07/10/2015 Elsevier Interactive Patient Education  2017 [place] Prevention in the Home Falls can cause injuries. They can happen to people of all ages. There are many things you can do to make your home safe and to help prevent falls. What can I do on the outside of my home? Regularly fix the edges of walkways and driveways and fix any cracks. Remove anything that might make you trip as you walk through a door, such as a raised step or threshold. Trim any bushes or trees on the path to your home. Use bright outdoor lighting. Clear any walking paths of anything that might make someone trip, such as rocks or tools. Regularly check to see if handrails are loose or broken. Make sure that both sides of any steps have handrails. Any raised decks and porches should have guardrails on the edges. Have any leaves, snow, or ice cleared regularly. Use sand or salt on walking paths during winter. Clean up any spills in your garage right away. This includes oil or grease spills. What can I do in the bathroom? Use night lights. Install grab bars by the toilet and in the tub and shower. Do not use towel bars as grab bars. Use non-skid mats or decals in the tub or shower. If you need to sit down in the shower, use a plastic, non-slip stool. Keep the floor dry. Clean up any water that spills on  the floor as soon as it happens. Remove soap buildup in the tub or shower regularly. Attach bath mats securely with double-sided non-slip rug tape. Do not have throw rugs and other things on the floor that can make you trip. What can I do in the bedroom? Use night lights. Make sure that you have a light by your bed that is easy to reach. Do not use any sheets or blankets that are too big for your bed. They should not hang down onto the floor. Have a firm chair that has side arms. You can use this for support while you get dressed. Do not have throw rugs and other things on the floor that can make you trip. What can I do in the kitchen? Clean up any spills right away. Avoid walking on wet floors. Keep items that you use a lot in easy-to-reach places. If you need to reach something above you, use a strong step stool that has a grab bar. Keep electrical cords out of the way. Do not use floor polish or wax that makes floors slippery. If you must use wax, use non-skid floor wax. Do not  have throw rugs and other things on the floor that can make you trip. What can I do with my stairs? Do not leave any items on the stairs. Make sure that there are handrails on both sides of the stairs and use them. Fix handrails that are broken or loose. Make sure that handrails are as long as the stairways. Check any carpeting to make sure that it is firmly attached to the stairs. Fix any carpet that is loose or worn. Avoid having throw rugs at the top or bottom of the stairs. If you do have throw rugs, attach them to the floor with carpet tape. Make sure that you have a light switch at the top of the stairs and the bottom of the stairs. If you do not have them, ask someone to add them for you. What else can I do to help prevent falls? Wear shoes that: Do not have high heels. Have rubber bottoms. Are comfortable and fit you well. Are closed at the toe. Do not wear sandals. If you use a stepladder: Make sure  that it is fully opened. Do not climb a closed stepladder. Make sure that both sides of the stepladder are locked into place. Ask someone to hold it for you, if possible. Clearly mark and make sure that you can see: Any grab bars or handrails. First and last steps. Where the edge of each step is. Use tools that help you move around (mobility aids) if they are needed. These include: Canes. Walkers. Scooters. Crutches. Turn on the lights when you go into a dark area. Replace any light bulbs as soon as they burn out. Set up your furniture so you have a clear path. Avoid moving your furniture around. If any of your floors are uneven, fix them. If there are any pets around you, be aware of where they are. Review your medicines with your doctor. Some medicines can make you feel dizzy. This can increase your chance of falling. Ask your doctor what other things that you can do to help prevent falls. This information is not intended to replace advice given to you by your health care provider. Make sure you discuss any questions you have with your health care provider. Document Released: 07/05/2009 Document Revised: 02/14/2016 Document Reviewed: 10/13/2014 Elsevier Interactive Patient Education  2017 [employer].

## 2023-03-10 NOTE — Progress Notes (Signed)
Subjective:   Katrina Benitez is a 77 y.o. female who presents for Medicare Annual (Subsequent) preventive examination.  Visit Complete: Virtual  I connected with  Josephine Cables on 03/10/23 by a audio enabled telemedicine application and verified that I am speaking with the correct person using two identifiers.  Patient Location: Home  Provider Location: Office/Clinic  I discussed the limitations of evaluation and management by telemedicine. The patient expressed understanding and agreed to proceed.   Review of Systems     Cardiac Risk Factors include: advanced age (>28men, >16 women);dyslipidemia;hypertension     Objective:    Today's Vitals   03/10/23 1258  Weight: 180 lb (81.6 kg)   Body mass index is 29.95 kg/m.     03/10/2023    1:04 PM 01/28/2023    1:53 PM 09/17/2022    7:44 PM 06/02/2022    9:55 AM 05/19/2022    9:14 AM 03/11/2022    9:21 AM 12/16/2019    3:06 PM  Advanced Directives  Does Patient Have a Medical Advance Directive? Yes No No No No Yes Yes  Type of Estate agent of [place];Living will     Healthcare Power of [employer] of [place];Living will  Does patient want to make changes to medical advance directive?       No - Patient declined  Copy of Healthcare Power of Attorney in Chart? No - copy requested     No - copy requested No - copy requested  Would patient like information on creating a medical advance directive?  No - Patient declined No - Patient declined No - Patient declined       Current Medications (verified) Outpatient Encounter Medications as of 03/10/2023  Medication Sig   ABRYSVO 120 MCG/0.5ML injection    acetaminophen (TYLENOL) 500 MG tablet Take 1,000 mg by mouth every 6 (six) hours as needed for mild pain or moderate pain.   Alum Hydroxide-Mag Carbonate (GAVISCON EXTRA STRENGTH) 160-105 MG CHEW Chew 2 tablets by mouth daily as needed (upset stomach).   apixaban (ELIQUIS) 5 MG TABS tablet Take 1  tablet (5 mg total) by mouth 2 (two) times daily.   betamethasone valerate (VALISONE) 0.1 % cream APPLY TWICE DAILY   betamethasone valerate ointment (VALISONE) 0.1 % Apply 1 Application topically 2 (two) times daily as needed (itching).   Cholecalciferol 50 MCG (2000 UT) CAPS Take 4,000 Units by mouth daily.   diazepam (VALIUM) 10 MG tablet TAKE ONE TABLET BY MOUTH EVERY TWELVE HOURS AS NEEDED FOR ANXIETY   gabapentin (NEURONTIN) 600 MG tablet TAKE ONE TABLET THREE TIMES DAILY   hydrOXYzine (VISTARIL) 25 MG capsule Two capsules 4 times daily   mirabegron ER (MYRBETRIQ) 50 MG TB24 tablet TAKE ONE TABLET DAILY   omeprazole (PRILOSEC) 40 MG capsule TAKE ONE CAPSULE EACH DAY   ondansetron (ZOFRAN-ODT) 8 MG disintegrating tablet TAKE ONE TABLET EVERY EIGHT HOURS AS NEEDED FOR NAUSEA AND VOMITING   Polyethyl Glyc-Propyl Glyc PF (SYSTANE HYDRATION PF) 0.4-0.3 % SOLN Place 1 drop into both eyes daily as needed (dry eyes).   Pramoxine HCl (CERAVE ITCH RELIEF) 1 % CREA Apply 1 Application topically daily as needed (itching).   Probiotic Product (ALIGN PO) Take 1 capsule by mouth daily.   Pumpkin Seed-Soy Germ (AZO BLADDER CONTROL/GO-LESS PO) Take 1 capsule by mouth in the morning, at noon, and at bedtime.   sodium chloride 1 g tablet Take 1 g by mouth daily.   tacrolimus (PROTOPIC) 0.1 %  ointment Apply topically 2 (two) times daily.   tazarotene (AVAGE) 0.1 % cream Apply topically.   triamcinolone cream (KENALOG) 0.1 % Apply 1 Application topically as needed (itching).   zolpidem (AMBIEN) 10 MG tablet TAKE ONE TABLET BY MOUTH AT BEDTIME AS NEEDED FOR SLEEP (Patient not taking: Reported on 03/10/2023)   No facility-administered encounter medications on file as of 03/10/2023.    Allergies (verified) Flagyl [metronidazole], Hydromorphone, Iodixanol, Fentanyl, Cipro [ciprofloxacin hcl], and Dilaudid [hydromorphone hcl]   History: Past Medical History:  Diagnosis Date   Allergy    Anemia    Anxiety     Arthritis    degenerative disc disorder   Cancer (HCC)    skin cancer   Cardiac arrhythmia    Cataract    surgery in right eye in 03/2022   Diverticulitis    Diverticulosis    GERD (gastroesophageal reflux disease)    Heart murmur    HTN (hypertension)    not on meds in 2 years per pt on 05/19/22 n   Infertility, female    Neuromuscular disorder (HCC)    idiopathic peripheral  neuropathy   Peripheral neuropathy    Positive PPD    SIADH (syndrome of inappropriate ADH production) (HCC)    Past Surgical History:  Procedure Laterality Date   BREAST BIOPSY Left    BREAST EXCISIONAL BIOPSY Left    benign   CATARACT EXTRACTION Right 2023   CERVICAL BIOPSY  W/ LOOP ELECTRODE EXCISION     CERVICAL POLYPECTOMY  02/2016   CHOLECYSTECTOMY N/A 06/02/2022   Procedure: LAPAROSCOPIC CHOLECYSTECTOMY WITH INTRAOPERATIVE CHOLANGIOGRAM;  Surgeon: Darnell Level, MD;  Location: [facility];  Service: General;  Laterality: N/A;   COLONOSCOPY     COLPOSCOPY     DILATION AND CURETTAGE OF UTERUS     HYSTEROSCOPY     LOOP RECORDER IMPLANT     LUMBAR DISC SURGERY     LUMBAR LAMINECTOMY     TONSILLECTOMY     UPPER GASTROINTESTINAL ENDOSCOPY     Family History  Problem Relation Age of Onset   Breast cancer Mother    Glaucoma Mother    Miscarriages / Stillbirths Mother    Heart attack Father    Depression Father    Early death Father    Heart disease Father    Tuberculosis Paternal Grandmother    Prostate cancer Paternal Grandfather    Depression Brother    Hypertension Brother    Colon cancer Neg Hx    Esophageal cancer Neg Hx    Rectal cancer Neg Hx    Stomach cancer Neg Hx    Social History   Socioeconomic History   Marital status: Married    Spouse name: John   Number of children: 0   Years of education: [employer] education level: Not on file  Occupational History   Occupation: retired physician  Tobacco Use   Smoking status: Never   Smokeless tobacco: Never  Vaping Use    Vaping Use: Never used  Substance and Sexual Activity   Alcohol use: Yes    Alcohol/week: 7.0 standard drinks of alcohol    Types: 7 Glasses of wine per week    Comment: 102 glsses wine daily   Drug use: No   Sexual activity: Not Currently    Birth control/protection: Post-menopausal  Other Topics Concern   Not on file  Social History Narrative   Lives at home with her husband.   Right-handed.   1-2 cups  caffeine per day.   Retired Marine scientist   Social Determinants of Corporate investment banker Strain: Low Risk  (03/11/2022)   Overall Financial Resource Strain (CARDIA)    Difficulty of Paying Living Expenses: Not hard at all  Food Insecurity: No Food Insecurity (03/11/2022)   Hunger Vital Sign    Worried About Running Out of Food in the Last Year: Never true    Ran Out of Food in the Last Year: Never true  Transportation Needs: No Transportation Needs (03/11/2022)   PRAPARE - Administrator, Civil Service (Medical): No    Lack of Transportation (Non-Medical): No  Physical Activity: Inactive (03/11/2022)   Exercise Vital Sign    Days of Exercise per Week: 0 days    Minutes of Exercise per Session: 0 min  Stress: Stress Concern Present (03/11/2022)   [employer] of Occupational Health - Occupational Stress Questionnaire    Feeling of Stress : To some extent  Social Connections: Moderately Isolated (03/11/2022)   Social Connection and Isolation Panel [NHANES]    Frequency of Communication with Friends and Family: Three times a week    Frequency of Social Gatherings with Friends and Family: Never    Attends Religious Services: Never    Database administrator or Organizations: No    Attends Engineer, structural: Never    Marital Status: Married    Tobacco Counseling Counseling given: Not Answered   Clinical Intake:  Pre-visit preparation completed: Yes  Pain : No/denies pain     BMI - recorded: 29.95 Nutritional Status: BMI 25 -29  Overweight Nutritional Risks: None Diabetes: No  How often do you need to have someone help you when you read instructions, pamphlets, or other written materials from your doctor or pharmacy?: 1 - Never  Interpreter Needed?: No  Information entered by :: Lanier Ensign, LPN   Activities of Daily Living    03/10/2023    1:05 PM 05/19/2022    9:15 AM  In your present state of health, do you have any difficulty performing the following activities:  Hearing? 0   Vision? 0   Difficulty concentrating or making decisions? 0   Walking or climbing stairs? 0   Dressing or bathing? 0   Doing errands, shopping? 0 0  Preparing Food and eating ? N   Using the Toilet? N   In the past six months, have you accidently leaked urine? Y   Comment wears a pad   Do you have problems with loss of bowel control? N   Managing your Medications? N   Managing your Finances? N   Housekeeping or managing your Housekeeping? N     Patient Care Team: Jeani Sow, MD as PCP - General (Family Medicine) Tonny Bollman, MD as PCP - Cardiology (Cardiology) Tonny Bollman, MD as Consulting Physician (Cardiology) Hilarie Fredrickson, MD as Consulting Physician (Gastroenterology) York Spaniel, MD (Inactive) as Consulting Physician (Neurology) Jerene Bears, MD as Consulting Physician (Gynecology) Janalyn Harder, MD (Inactive) as Consulting Physician (Dermatology) Manning Charity, OD as Consulting Physician (Optometry)  Indicate any recent Medical Services you may have received from other than [facility] providers in the past year (date may be approximate).     Assessment:   This is a routine wellness examination for [facility].  Hearing/Vision screen Hearing Screening - Comments:: Pt denies any hearing issues  Vision Screening - Comments:: Pt follows up with Dr Randon Goldsmith for annual eye exams   Dietary issues and exercise  activities discussed:     Goals Addressed             This Visit's Progress    Patient  Stated       Stay healthy and active        Depression Screen    03/10/2023    1:05 PM 01/07/2023    2:19 PM 03/20/2022   11:10 AM 03/11/2022    9:19 AM 11/04/2021    3:19 PM 04/17/2021    3:09 PM 07/13/2020    4:08 PM  PHQ 2/9 Scores  PHQ - 2 Score 0 0 0 0 0 0 1  PHQ- 9 Score  0 0  0  5    Fall Risk    03/10/2023    1:05 PM 01/07/2023    2:18 PM 03/20/2022   11:09 AM 03/11/2022    9:22 AM 11/04/2021    3:10 PM  Fall Risk   Falls in the past year? 0 1 1 1  0  Number falls in past yr: 0 0 1 1 0  Injury with Fall? 0 1 0 0 0  Risk for fall due to : Impaired vision;Impaired balance/gait;Impaired mobility Impaired balance/gait;Impaired mobility No Fall Risks;Impaired mobility;Impaired balance/gait Impaired vision;Impaired balance/gait;Impaired mobility;History of fall(s)   Follow up Falls prevention discussed Falls prevention discussed;Education provided Education provided;Falls prevention discussed Falls prevention discussed     MEDICARE RISK AT HOME:  Medicare Risk at Home - 03/10/23 1306     Any stairs in or around the home? Yes    If so, are there any without handrails? No    Home free of loose throw rugs in walkways, pet beds, electrical cords, etc? Yes    Adequate lighting in your home to reduce risk of falls? Yes    Life alert? No    Use of a cane, walker or w/c? Yes    Grab bars in the bathroom? Yes    Shower chair or bench in shower? Yes    Elevated toilet seat or a handicapped toilet? Yes             TIMED UP AND GO:  Was the test performed?  No    Cognitive Function:        12/16/2019    3:07 PM  6CIT Screen  What Year? 0 points  What month? 0 points  What time? 0 points  Count back from 20 0 points  Months in reverse 0 points  Repeat phrase 0 points  Total Score 0 points    Immunizations Immunization History  Administered Date(s) Administered   COVID-19, mRNA, vaccine(Comirnaty)12 years and older 01/07/2023   Fluad Quad(high Dose 65+)  06/29/2019, 07/13/2020, 06/17/2022   Influenza, High Dose Seasonal PF 08/12/2018, 10/01/2021   PFIZER(Purple Top)SARS-COV-2 Vaccination 10/14/2019, 11/03/2019, 06/21/2020   Pfizer Covid-19 Vaccine Bivalent Booster 38yrs & up 10/01/2021   Pneumococcal Conjugate-13 10/05/2014   Pneumococcal Polysaccharide-23 09/01/2017   Rsv, Bivalent, Protein Subunit Rsvpref,pf Verdis Frederickson) 01/07/2023   Tdap 07/07/2015   Zoster Recombinat (Shingrix) 05/28/2018, 08/12/2018    TDAP status: Up to date  Flu Vaccine status: Up to date  Pneumococcal vaccine status: Up to date  Covid-19 vaccine status: Completed vaccines  Qualifies for Shingles Vaccine? Yes   Zostavax completed Yes   Shingrix Completed?: Yes  Screening Tests Health Maintenance  Topic Date Due   COVID-19 Vaccine (6 - 2023-24 season) 03/04/2023   INFLUENZA VACCINE  04/23/2023   Medicare Annual Wellness (AWV)  03/09/2024   DTaP/Tdap/Td (2 -  Td or Tdap) 07/06/2025   Pneumonia Vaccine 77+ Years old  Completed   DEXA SCAN  Completed   Hepatitis C Screening  Completed   Zoster Vaccines- Shingrix  Completed   HPV VACCINES  Aged Out   Colonoscopy  Discontinued    Health Maintenance  Health Maintenance Due  Topic Date Due   COVID-19 Vaccine (6 - 2023-24 season) 03/04/2023    Colorectal cancer screening: Type of screening: Colonoscopy. Completed 06/07/19. Repeat every as directed  years  Mammogram status: Completed 10/14/22. Repeat every year  Bone Density status: Completed 03/28/19. Results reflect: Bone density results: OSTEOPENIA. Repeat every 2 years.   Additional Screening:  Hepatitis C Screening:  Completed 06/02/16  Vision Screening: Recommended annual ophthalmology exams for early detection of glaucoma and other disorders of the eye. Is the patient up to date with their annual eye exam?  Yes  Who is the provider or what is the name of the office in which the patient attends annual eye exams? Dr Randon Goldsmith  If pt is not established  with a provider, would they like to be referred to a provider to establish care? No .   Dental Screening: Recommended annual dental exams for proper oral hygiene   Community Resource Referral / Chronic Care Management: CRR required this visit?  No   CCM required this visit?  No     Plan:     I have personally reviewed and noted the following in the patient's chart:   Medical and social history Use of alcohol, tobacco or illicit drugs  Current medications and supplements including opioid prescriptions. Patient is not currently taking opioid prescriptions. Functional ability and status Nutritional status Physical activity Advanced directives List of other physicians Hospitalizations, surgeries, and ER visits in previous 12 months Vitals Screenings to include cognitive, depression, and falls Referrals and appointments  In addition, I have reviewed and discussed with patient certain preventive protocols, quality metrics, and best practice recommendations. A written personalized care plan for preventive services as well as general preventive health recommendations were provided to patient.     Marzella Schlein, LPN   06/05/7828   After Visit Summary: (MyChart) Due to this being a telephonic visit, the after visit summary with patients personalized plan was offered to patient via MyChart   Nurse Notes: none

## 2023-03-11 ENCOUNTER — Encounter (HOSPITAL_COMMUNITY): Payer: Medicare Other | Admitting: Physical Therapy

## 2023-03-11 ENCOUNTER — Ambulatory Visit: Payer: Medicare Other | Admitting: Nurse Practitioner

## 2023-03-11 NOTE — Telephone Encounter (Signed)
Called and left message with [place] that form was received-signed by MD and faxed back today with confirmation received.  Also called patient to make aware and will place originals in Dr Earmon Phoenix box for covering RN to give patient during visit tomorrow.

## 2023-03-12 ENCOUNTER — Encounter: Payer: Self-pay | Admitting: Cardiovascular Disease

## 2023-03-12 ENCOUNTER — Other Ambulatory Visit (INDEPENDENT_AMBULATORY_CARE_PROVIDER_SITE_OTHER): Payer: Medicare Other

## 2023-03-12 ENCOUNTER — Ambulatory Visit: Payer: Medicare Other | Attending: Cardiovascular Disease | Admitting: Cardiovascular Disease

## 2023-03-12 VITALS — BP 128/72 | HR 99 | Ht 65.0 in | Wt 174.0 lb

## 2023-03-12 DIAGNOSIS — E222 Syndrome of inappropriate secretion of antidiuretic hormone: Secondary | ICD-10-CM

## 2023-03-12 DIAGNOSIS — Z79899 Other long term (current) drug therapy: Secondary | ICD-10-CM

## 2023-03-12 DIAGNOSIS — I480 Paroxysmal atrial fibrillation: Secondary | ICD-10-CM | POA: Insufficient documentation

## 2023-03-12 NOTE — Patient Instructions (Signed)
Medication Instructions:  Your physician recommends that you continue on your current medications as directed. Please refer to the Current Medication list given to you today.  *If you need a refill on your cardiac medications before your next appointment, please call your pharmacy*  Follow-Up: At [facility], you and your health needs are our priority.  As part of our continuing mission to provide you with exceptional heart care, we have created designated Provider Care Teams.  These Care Teams include your primary Cardiologist (physician) and Advanced Practice Providers (APPs -  Physician Assistants and Nurse Practitioners) who all work together to provide you with the care you need, when you need it.  Your next appointment:   1 year(s)  Provider:   Michael Cooper, MD     

## 2023-03-12 NOTE — Progress Notes (Signed)
Cardiology Office Note:    Date:  03/12/2023   ID:  Katrina Benitez, [name] 1946-05-13, MRN [id number]  PCP:  Katrina Sow, MD   [facility] Providers Cardiologist:  Katrina Bollman, MD     Referring MD: Katrina Sow, MD   Chief Complaint  Patient presents with   Atrial Fibrillation    History of Present Illness:    Katrina Benitez is a 77 y.o. female presenting for follow-up of atrial fibrillation.  I saw her October 2023 for discussion of watchman implantation.  After shared decision-making conversation, she opted to trial oral anticoagulation with apixaban and she has been taking it since that time.  She underwent a second opinion consultation at [facility] and they also recommended continuation of oral anticoagulation unless her fall risk worsens.  Watchman implantation is a future consideration if she develops worsening problems with gait instability and falling.  She presents today for follow-up evaluation.  The patient is here with her husband today.  She seems to be doing fine from a cardiac standpoint.  She continues to have some gait instability but denies any recent falls.  She has had no heart palpitations, chest pain, or shortness of breath.  She has not had any recent bleeding problems on apixaban.  Past Medical History:  Diagnosis Date   Allergy    Anemia    Anxiety    Arthritis    degenerative disc disorder   Cancer (HCC)    skin cancer   Cardiac arrhythmia    Cataract    surgery in right eye in 03/2022   Diverticulitis    Diverticulosis    GERD (gastroesophageal reflux disease)    Heart murmur    HTN (hypertension)    not on meds in 2 years per pt on 05/19/22 n   Infertility, female    Neuromuscular disorder (HCC)    idiopathic peripheral  neuropathy   Peripheral neuropathy    Positive PPD    SIADH (syndrome of inappropriate ADH production) (HCC)     Past Surgical History:  Procedure Laterality Date   BREAST BIOPSY Left    BREAST EXCISIONAL  BIOPSY Left    benign   CATARACT EXTRACTION Right 2023   CERVICAL BIOPSY  W/ LOOP ELECTRODE EXCISION     CERVICAL POLYPECTOMY  02/2016   CHOLECYSTECTOMY N/A 06/02/2022   Procedure: LAPAROSCOPIC CHOLECYSTECTOMY WITH INTRAOPERATIVE CHOLANGIOGRAM;  Surgeon: Katrina Level, MD;  Location: [facility];  Service: General;  Laterality: N/A;   COLONOSCOPY     COLPOSCOPY     DILATION AND CURETTAGE OF UTERUS     HYSTEROSCOPY     LOOP RECORDER IMPLANT     LUMBAR DISC SURGERY     LUMBAR LAMINECTOMY     TONSILLECTOMY     UPPER GASTROINTESTINAL ENDOSCOPY      Current Medications: Current Meds  Medication Sig   ABRYSVO 120 MCG/0.5ML injection    acetaminophen (TYLENOL) 500 MG tablet Take 1,000 mg by mouth every 6 (six) hours as needed for mild pain or moderate pain.   Alum Hydroxide-Mag Carbonate (GAVISCON EXTRA STRENGTH) 160-105 MG CHEW Chew 2 tablets by mouth daily as needed (upset stomach).   apixaban (ELIQUIS) 5 MG TABS tablet Take 1 tablet (5 mg total) by mouth 2 (two) times daily.   betamethasone valerate (VALISONE) 0.1 % cream APPLY TWICE DAILY   betamethasone valerate ointment (VALISONE) 0.1 % Apply 1 Application topically 2 (two) times daily as needed (itching).   Cholecalciferol 50 MCG (  2000 UT) CAPS Take 4,000 Units by mouth daily.   diazepam (VALIUM) 10 MG tablet TAKE ONE TABLET BY MOUTH EVERY TWELVE HOURS AS NEEDED FOR ANXIETY   gabapentin (NEURONTIN) 600 MG tablet TAKE ONE TABLET THREE TIMES DAILY   hydrOXYzine (VISTARIL) 25 MG capsule Two capsules 4 times daily   mirabegron ER (MYRBETRIQ) 50 MG TB24 tablet TAKE ONE TABLET DAILY   omeprazole (PRILOSEC) 40 MG capsule TAKE ONE CAPSULE EACH DAY   ondansetron (ZOFRAN-ODT) 8 MG disintegrating tablet TAKE ONE TABLET EVERY EIGHT HOURS AS NEEDED FOR NAUSEA AND VOMITING   Polyethyl Glyc-Propyl Glyc PF (SYSTANE HYDRATION PF) 0.4-0.3 % SOLN Place 1 drop into both eyes daily as needed (dry eyes).   Pramoxine HCl (CERAVE ITCH RELIEF) 1 % CREA Apply 1  Application topically daily as needed (itching).   Probiotic Product (ALIGN PO) Take 1 capsule by mouth daily.   Pumpkin Seed-Soy Germ (AZO BLADDER CONTROL/GO-LESS PO) Take 1 capsule by mouth in the morning, at noon, and at bedtime.   sodium chloride 1 g tablet Take 1 g by mouth daily.   tacrolimus (PROTOPIC) 0.1 % ointment Apply topically 2 (two) times daily.   tazarotene (AVAGE) 0.1 % cream Apply topically.   triamcinolone cream (KENALOG) 0.1 % Apply 1 Application topically as needed (itching).   zolpidem (AMBIEN) 10 MG tablet TAKE ONE TABLET BY MOUTH AT BEDTIME AS NEEDED FOR SLEEP     Allergies:   Flagyl [metronidazole], Hydromorphone, Iodixanol, Fentanyl, Cipro [ciprofloxacin hcl], and Dilaudid [hydromorphone hcl]   Social History   Socioeconomic History   Marital status: Married    Spouse name: John   Number of children: 0   Years of education: [employer] education Benitez: Not on file  Occupational History   Occupation: retired physician  Tobacco Use   Smoking status: Never   Smokeless tobacco: Never  Vaping Use   Vaping Use: Never used  Substance and Sexual Activity   Alcohol use: Yes    Alcohol/week: 7.0 standard drinks of alcohol    Types: 7 Glasses of wine per week    Comment: 102 glsses wine daily   Drug use: No   Sexual activity: Not Currently    Birth control/protection: Post-menopausal  Other Topics Concern   Not on file  Social History Narrative   Lives at home with her husband.   Right-handed.   1-2 cups caffeine per day.   Retired Marine scientist   Social Determinants of Corporate investment banker Strain: Low Risk  (03/11/2022)   Overall Financial Resource Strain (CARDIA)    Difficulty of Paying Living Expenses: Not hard at all  Food Insecurity: No Food Insecurity (03/11/2022)   Hunger Vital Sign    Worried About Running Out of Food in the Last Year: Never true    Ran Out of Food in the Last Year: Never true  Transportation Needs: No Transportation  Needs (03/11/2022)   PRAPARE - Administrator, Civil Service (Medical): No    Lack of Transportation (Non-Medical): No  Physical Activity: Inactive (03/11/2022)   Exercise Vital Sign    Days of Exercise per Week: 0 days    Minutes of Exercise per Session: 0 min  Stress: Stress Concern Present (03/11/2022)   [employer] of Occupational Health - Occupational Stress Questionnaire    Feeling of Stress : To some extent  Social Connections: Moderately Isolated (03/11/2022)   Social Connection and Isolation Panel [NHANES]    Frequency of Communication with Friends  and Family: Three times a week    Frequency of Social Gatherings with Friends and Family: Never    Attends Religious Services: Never    Database administrator or Organizations: No    Attends Engineer, structural: Never    Marital Status: Married     Family History: The patient's family history includes Breast cancer in her mother; Depression in her brother and father; Early death in her father; Glaucoma in her mother; Heart attack in her father; Heart disease in her father; Hypertension in her brother; Miscarriages / Stillbirths in her mother; Prostate cancer in her paternal grandfather; Tuberculosis in her paternal grandmother. There is no history of Colon cancer, Esophageal cancer, Rectal cancer, or Stomach cancer.  ROS:   Please see the history of present illness.    All other systems reviewed and are negative.  EKGs/Labs/Other Studies Reviewed:    The following studies were reviewed today: Cardiac Studies & Procedures     STRESS TESTS  NM MYOCAR MULTI W/SPECT W 01/22/2016  Narrative CLINICAL DATA:  Chest pain, cardiac arrhythmia, heart murmur  EXAM: MYOCARDIAL IMAGING WITH SPECT (REST AND PHARMACOLOGIC-STRESS)  GATED LEFT VENTRICULAR WALL MOTION STUDY  LEFT VENTRICULAR EJECTION FRACTION  TECHNIQUE: Standard myocardial SPECT imaging was performed after resting intravenous injection of 10 mCi  Tc-28m sestamibi. Subsequently, intravenous infusion of Lexiscan was performed under the supervision of the Cardiology staff. At peak effect of the drug, 30 mCi Tc-98m sestamibi was injected intravenously and standard myocardial SPECT imaging was performed. Quantitative gated imaging was also performed to evaluate left ventricular wall motion, and estimate left ventricular ejection fraction.  COMPARISON:  None.  FINDINGS: Perfusion: No decreased activity in the left ventricle on stress imaging to suggest reversible ischemia or infarction.  Wall Motion: Normal left ventricular wall motion. No left ventricular dilation.  Left Ventricular Ejection Fraction: 66 %  End diastolic volume 81 ml  End systolic volume 28 ml  IMPRESSION: 1. No reversible ischemia or infarction.  2. Normal left ventricular wall motion.  3. Left ventricular ejection fraction 66%  4. Low-risk stress test findings*.  *2012 Appropriate Use Criteria for Coronary Revascularization Focused Update: J Am Coll Cardiol. 2012;59(9):857-881. http://content.dementiazones.com.aspx?articleid=1201161   Electronically Signed By: Charline Bills M.D. On: 01/22/2016 13:29   ECHOCARDIOGRAM  ECHOCARDIOGRAM COMPLETE 02/15/2021  Narrative ECHOCARDIOGRAM REPORT    Patient Name:   VIANY KAMAI     Date of Exam: 02/15/2021 Medical Rec #:  [id number]     Height:       66.0 in Accession #:    [id number]    Weight:       190.0 lb Date of Birth:  06/25/1946      BSA:          1.957 m Patient Age:    74 years      BP:           130/76 mmHg Patient Gender: F             HR:           83 bpm. Exam Location:  [address]  Procedure: 2D Echo, 3D Echo, Cardiac Doppler and Color Doppler  Indications:    R55 Syncope  History:        Patient has prior history of Echocardiogram examinations, most recent 01/21/2019. Arrythmias:Atrial Fibrillation, Signs/Symptoms:Syncope and Murmur; Risk Factors:Family History of Coronary  Artery Disease and Hypertension.  Sonographer:    Farrel Conners RDCS Referring Phys: [id number] Jaylenn Baiza  IMPRESSIONS  1. Left ventricular ejection fraction, by estimation, is 60 to 65%. The left ventricle has normal function. The left ventricle has no regional wall motion abnormalities. Left ventricular diastolic parameters were normal. 2. Right ventricular systolic function is normal. The right ventricular size is normal. There is normal pulmonary artery systolic pressure. 3. The mitral valve is normal in structure. Trivial mitral valve regurgitation. No evidence of mitral stenosis. 4. The aortic valve is tricuspid. Aortic valve regurgitation is mild. Mild aortic valve sclerosis is present, with no evidence of aortic valve stenosis. 5. Aortic dilatation noted. There is mild dilatation of the ascending aorta, measuring 40 mm. 6. The inferior vena cava is normal in size with greater than 50% respiratory variability, suggesting right atrial pressure of 3 mmHg.  FINDINGS Left Ventricle: Left ventricular ejection fraction, by estimation, is 60 to 65%. The left ventricle has normal function. The left ventricle has no regional wall motion abnormalities. The left ventricular internal cavity size was normal in size. There is no left ventricular hypertrophy. Left ventricular diastolic parameters were normal.  Right Ventricle: The right ventricular size is normal.Right ventricular systolic function is normal. There is normal pulmonary artery systolic pressure. The tricuspid regurgitant velocity is 2.33 m/s, and with an assumed right atrial pressure of 3 mmHg, the estimated right ventricular systolic pressure is 24.7 mmHg.  Left Atrium: Left atrial size was normal in size.  Right Atrium: Right atrial size was normal in size.  Pericardium: There is no evidence of pericardial effusion.  Mitral Valve: The mitral valve is normal in structure. Mild mitral annular calcification. Trivial mitral valve  regurgitation. No evidence of mitral valve stenosis.  Tricuspid Valve: The tricuspid valve is normal in structure. Tricuspid valve regurgitation is trivial. No evidence of tricuspid stenosis.  Aortic Valve: The aortic valve is tricuspid. Aortic valve regurgitation is mild. Aortic regurgitation PHT measures 600 msec. Mild aortic valve sclerosis is present, with no evidence of aortic valve stenosis.  Pulmonic Valve: The pulmonic valve was normal in structure. Pulmonic valve regurgitation is not visualized. No evidence of pulmonic stenosis.  Aorta: Aortic dilatation noted. There is mild dilatation of the ascending aorta, measuring 40 mm.  Venous: The inferior vena cava is normal in size with greater than 50% respiratory variability, suggesting right atrial pressure of 3 mmHg.  IAS/Shunts: No atrial Benitez shunt detected by color flow Doppler.   LEFT VENTRICLE PLAX 2D LVIDd:         4.25 cm  Diastology LVIDs:         2.45 cm  LV e' medial:    9.14 cm/s LV PW:         0.90 cm  LV E/e' medial:  8.5 LV IVS:        0.70 cm  LV e' lateral:   11.20 cm/s LVOT diam:     2.00 cm  LV E/e' lateral: 6.9 LV SV:         79 LV SV Index:   40 LVOT Area:     3.14 cm  3D Volume EF: 3D EF:        72 % LV EDV:       146 ml LV ESV:       40 ml LV SV:        106 ml  RIGHT VENTRICLE RV S prime:     19.60 cm/s TAPSE (M-mode): 2.5 cm  LEFT ATRIUM             Index  RIGHT ATRIUM           Index LA diam:        3.90 cm 1.99 cm/m  RA Area:     16.60 cm LA Vol (A2C):   57.1 ml 29.18 ml/m RA Volume:   43.40 ml  22.18 ml/m LA Vol (A4C):   65.0 ml 33.22 ml/m LA Biplane Vol: 61.7 ml 31.53 ml/m AORTIC VALVE LVOT Vmax:   114.00 cm/s LVOT Vmean:  72.000 cm/s LVOT VTI:    0.252 m AI PHT:      600 msec  AORTA Ao Root diam: 3.40 cm Ao Asc diam:  3.95 cm  MITRAL VALVE                TRICUSPID VALVE MV Area (PHT): cm          TR Peak grad:   21.7 mmHg MV Decel Time: 253 msec     TR Vmax:         233.00 cm/s MV E velocity: 77.55 cm/s MV A velocity: 113.50 cm/s  SHUNTS MV E/A ratio:  0.68         Systemic VTI:  0.25 m Systemic Diam: 2.00 cm  Olga Millers MD Electronically signed by Olga Millers MD Signature Date/Time: 02/15/2021/12:54:22 PM    Final    MONITORS  CARDIAC EVENT MONITOR 04/29/2021  Narrative 1. The basic rhythm is normal sinus with an average HR of 82 bpm 2. No atrial fibrillation or flutter 3. No high-grade heart block or pathologic pauses 4. There are rare PVC's (<1%) and occasional supraventricular beats (3%) without sustained arrhythmias                Recent Labs: 01/07/2023: ALT 12; BUN 17; Creatinine, Ser 1.02; Hemoglobin 11.5; Platelets 380.0; Potassium 4.7; Sodium 135; TSH 1.35  Recent Lipid Panel    Component Value Date/Time   CHOL 228 (H) 01/07/2023 1506   TRIG 76.0 01/07/2023 1506   HDL 101.40 01/07/2023 1506   CHOLHDL 2 01/07/2023 1506   VLDL 15.2 01/07/2023 1506   LDLCALC 112 (H) 01/07/2023 1506   LDLCALC 99 07/13/2020 1500     Risk Assessment/Calculations:    CHA2DS2-VASc Score = 3   This indicates a 3.2% annual risk of stroke. The patient's score is based upon: CHF History: 0 HTN History: 0 Diabetes History: 0 Stroke History: 0 Vascular Disease History: 0 Age Score: 2 Gender Score: 1               Physical Exam:    VS:  BP 128/72   Pulse 99   Ht 5\' 5"  (1.651 m)   Wt 174 lb (78.9 kg) Comment: pt reported  LMP  (LMP Unknown)   SpO2 97%   BMI 28.96 kg/m     Wt Readings from Last 3 Encounters:  03/12/23 174 lb (78.9 kg)  03/10/23 180 lb (81.6 kg)  09/17/22 180 lb (81.6 kg)     GEN:  Well nourished, well developed in no acute distress HEENT: Normal NECK: No JVD; No carotid bruits LYMPHATICS: No lymphadenopathy CARDIAC: RRR, no murmurs, rubs, gallops RESPIRATORY:  Clear to auscultation without rales, wheezing or rhonchi  ABDOMEN: Soft, non-tender, non-distended MUSCULOSKELETAL: 1+ bilateral ankle and  pedal edema; No deformity  SKIN: Warm and dry NEUROLOGIC:  Alert and oriented x 3 PSYCHIATRIC:  Normal affect   ASSESSMENT:    1. Paroxysmal atrial fibrillation (HCC)    PLAN:    In order of problems listed above:  The patient has paroxysmal atrial fibrillation.  Her CHA2DS2-VASc score is 3 for age and gender only.  I reviewed her recent loop transmissions and she has an exceedingly low burden of atrial fibrillation.  In reviewing transmissions over the last year, her A-fib burden appears to be about 0.1%.  They wonder whether she really needs to be on anticoagulation.  She understands that there is an increased risk of stroke even at her low burden, but that with an A-fib burden is low, I am not sure there is strong data to support continuing her on oral anticoagulation.  They have an appointment with Dr. Ladona Ridgel next week and I will defer to him, but I would probably be inclined to stop her Eliquis especially since she has a loop recorder in place to monitor for A-fib.  Otherwise I will plan to see her back in 1 year.           Medication Adjustments/Labs and Tests Ordered: Current medicines are reviewed at length with the patient today.  Concerns regarding medicines are outlined above.  Orders Placed This Encounter  Procedures   EKG 12-Lead   No orders of the defined types were placed in this encounter.   Patient Instructions  Medication Instructions:  Your physician recommends that you continue on your current medications as directed. Please refer to the Current Medication list given to you today.  *If you need a refill on your cardiac medications before your next appointment, please call your pharmacy*  Follow-Up: At [facility], you and your health needs are our priority.  As part of our continuing mission to provide you with exceptional heart care, we have created designated Provider Care Teams.  These Care Teams include your primary Cardiologist (physician) and  Advanced Practice Providers (APPs -  Physician Assistants and Nurse Practitioners) who all work together to provide you with the care you need, when you need it.   Your next appointment:   1 year(s)  Provider:   Tonny Bollman, MD        Signed, Katrina Bollman, MD  03/12/2023 4:37 PM    [facility]

## 2023-03-13 ENCOUNTER — Other Ambulatory Visit: Payer: Self-pay | Admitting: *Deleted

## 2023-03-13 ENCOUNTER — Encounter (HOSPITAL_COMMUNITY): Payer: Medicare Other | Admitting: Physical Therapy

## 2023-03-13 DIAGNOSIS — R8290 Unspecified abnormal findings in urine: Secondary | ICD-10-CM

## 2023-03-13 LAB — BASIC METABOLIC PANEL
BUN: 25 mg/dL — ABNORMAL HIGH (ref 6–23)
CO2: 26 mEq/L (ref 19–32)
Calcium: 8.7 mg/dL (ref 8.4–10.5)
Chloride: 94 mEq/L — ABNORMAL LOW (ref 96–112)
Creatinine, Ser: 1.2 mg/dL (ref 0.40–1.20)
GFR: 43.85 mL/min — ABNORMAL LOW (ref >60.00)
Glucose, Bld: 89 mg/dL (ref 70–99)
Potassium: 4.9 mEq/L (ref 3.5–5.1)
Sodium: 129 mEq/L — ABNORMAL LOW (ref 135–145)

## 2023-03-13 NOTE — Progress Notes (Signed)
Sodium low again.  What has she done differntly?  Any symptoms?Marland Kitchen

## 2023-03-13 NOTE — Progress Notes (Signed)
Carelink Summary Report / Loop Recorder 

## 2023-03-16 ENCOUNTER — Encounter (HOSPITAL_COMMUNITY): Payer: Medicare Other | Admitting: Physical Therapy

## 2023-03-16 ENCOUNTER — Ambulatory Visit: Payer: Medicare Other | Admitting: Internal Medicine

## 2023-03-16 DIAGNOSIS — L814 Other melanin hyperpigmentation: Secondary | ICD-10-CM | POA: Diagnosis not present

## 2023-03-16 DIAGNOSIS — C44519 Basal cell carcinoma of skin of other part of trunk: Secondary | ICD-10-CM | POA: Diagnosis not present

## 2023-03-16 DIAGNOSIS — Z85828 Personal history of other malignant neoplasm of skin: Secondary | ICD-10-CM | POA: Diagnosis not present

## 2023-03-16 DIAGNOSIS — L298 Other pruritus: Secondary | ICD-10-CM | POA: Diagnosis not present

## 2023-03-16 DIAGNOSIS — L570 Actinic keratosis: Secondary | ICD-10-CM | POA: Diagnosis not present

## 2023-03-16 DIAGNOSIS — D692 Other nonthrombocytopenic purpura: Secondary | ICD-10-CM | POA: Diagnosis not present

## 2023-03-16 DIAGNOSIS — L821 Other seborrheic keratosis: Secondary | ICD-10-CM | POA: Diagnosis not present

## 2023-03-17 LAB — CUP PACEART REMOTE DEVICE CHECK
Date Time Interrogation Session: 20240624231109
Implantable Pulse Generator Implant Date: 20230614

## 2023-03-18 ENCOUNTER — Other Ambulatory Visit: Payer: Self-pay | Admitting: *Deleted

## 2023-03-18 ENCOUNTER — Encounter (HOSPITAL_COMMUNITY): Payer: Medicare Other | Admitting: Physical Therapy

## 2023-03-18 ENCOUNTER — Encounter: Payer: Self-pay | Admitting: Family Medicine

## 2023-03-18 DIAGNOSIS — R8290 Unspecified abnormal findings in urine: Secondary | ICD-10-CM

## 2023-03-20 ENCOUNTER — Encounter (HOSPITAL_COMMUNITY): Payer: Medicare Other | Admitting: Physical Therapy

## 2023-03-23 ENCOUNTER — Ambulatory Visit (INDEPENDENT_AMBULATORY_CARE_PROVIDER_SITE_OTHER): Payer: Medicare Other

## 2023-03-23 DIAGNOSIS — I480 Paroxysmal atrial fibrillation: Secondary | ICD-10-CM | POA: Diagnosis not present

## 2023-04-02 DIAGNOSIS — L570 Actinic keratosis: Secondary | ICD-10-CM | POA: Diagnosis not present

## 2023-04-09 ENCOUNTER — Other Ambulatory Visit: Payer: Self-pay | Admitting: Family Medicine

## 2023-04-14 NOTE — Progress Notes (Signed)
Carelink Summary Report / Loop Recorder 

## 2023-04-15 ENCOUNTER — Other Ambulatory Visit (INDEPENDENT_AMBULATORY_CARE_PROVIDER_SITE_OTHER): Payer: Medicare Other

## 2023-04-15 DIAGNOSIS — E222 Syndrome of inappropriate secretion of antidiuretic hormone: Secondary | ICD-10-CM | POA: Diagnosis not present

## 2023-04-15 DIAGNOSIS — R8290 Unspecified abnormal findings in urine: Secondary | ICD-10-CM | POA: Diagnosis not present

## 2023-04-15 DIAGNOSIS — Z79899 Other long term (current) drug therapy: Secondary | ICD-10-CM | POA: Diagnosis not present

## 2023-04-15 LAB — URINALYSIS, ROUTINE W REFLEX MICROSCOPIC
Bilirubin Urine: NEGATIVE
Hgb urine dipstick: NEGATIVE
Ketones, ur: NEGATIVE
Leukocytes,Ua: NEGATIVE
Nitrite: NEGATIVE
RBC / HPF: NONE SEEN (ref 0–?)
Specific Gravity, Urine: 1.015 (ref 1.000–1.030)
Total Protein, Urine: NEGATIVE
Urine Glucose: NEGATIVE
Urobilinogen, UA: 0.2 (ref 0.0–1.0)
pH: 6 (ref 5.0–8.0)

## 2023-04-15 LAB — BASIC METABOLIC PANEL
BUN: 24 mg/dL — ABNORMAL HIGH (ref 6–23)
CO2: 24 mEq/L (ref 19–32)
Calcium: 9.2 mg/dL (ref 8.4–10.5)
Chloride: 92 mEq/L — ABNORMAL LOW (ref 96–112)
Creatinine, Ser: 1.08 mg/dL (ref 0.40–1.20)
GFR: 49.73 mL/min — ABNORMAL LOW (ref >60.00)
Glucose, Bld: 95 mg/dL (ref 70–99)
Potassium: 5.1 mEq/L (ref 3.5–5.1)
Sodium: 128 mEq/L — ABNORMAL LOW (ref 135–145)

## 2023-04-15 NOTE — Progress Notes (Signed)
Urine-no mention of renal cells this time Sodium low-is she taking the salt tabs?  Maybe try bid and repeat in 2 wks?

## 2023-04-16 ENCOUNTER — Other Ambulatory Visit: Payer: Self-pay | Admitting: *Deleted

## 2023-04-16 DIAGNOSIS — R7989 Other specified abnormal findings of blood chemistry: Secondary | ICD-10-CM

## 2023-04-17 ENCOUNTER — Other Ambulatory Visit (HOSPITAL_COMMUNITY)
Admission: RE | Admit: 2023-04-17 | Discharge: 2023-04-17 | Disposition: A | Discharge status: Home / Self Care | Payer: Medicare Other | Source: Ambulatory Visit | Attending: Obstetrics & Gynecology | Admitting: Obstetrics & Gynecology

## 2023-04-17 ENCOUNTER — Encounter (HOSPITAL_BASED_OUTPATIENT_CLINIC_OR_DEPARTMENT_OTHER): Payer: Self-pay | Admitting: Obstetrics & Gynecology

## 2023-04-17 ENCOUNTER — Ambulatory Visit (HOSPITAL_BASED_OUTPATIENT_CLINIC_OR_DEPARTMENT_OTHER): Payer: Medicare Other | Admitting: Obstetrics & Gynecology

## 2023-04-17 VITALS — BP 132/61 | HR 77 | Ht 65.0 in

## 2023-04-17 DIAGNOSIS — N952 Postmenopausal atrophic vaginitis: Secondary | ICD-10-CM

## 2023-04-17 DIAGNOSIS — Z124 Encounter for screening for malignant neoplasm of cervix: Secondary | ICD-10-CM | POA: Diagnosis not present

## 2023-04-17 DIAGNOSIS — M8580 Other specified disorders of bone density and structure, unspecified site: Secondary | ICD-10-CM | POA: Diagnosis not present

## 2023-04-17 DIAGNOSIS — R102 Pelvic and perineal pain: Secondary | ICD-10-CM

## 2023-04-17 DIAGNOSIS — Z1231 Encounter for screening mammogram for malignant neoplasm of breast: Secondary | ICD-10-CM | POA: Diagnosis not present

## 2023-04-17 DIAGNOSIS — E2839 Other primary ovarian failure: Secondary | ICD-10-CM

## 2023-04-17 DIAGNOSIS — Z01419 Encounter for gynecological examination (general) (routine) without abnormal findings: Secondary | ICD-10-CM | POA: Diagnosis not present

## 2023-04-17 DIAGNOSIS — R3915 Urgency of urination: Secondary | ICD-10-CM | POA: Diagnosis not present

## 2023-04-17 DIAGNOSIS — R300 Dysuria: Secondary | ICD-10-CM | POA: Diagnosis not present

## 2023-04-17 MED ORDER — ESTRADIOL 0.1 MG/GM VA CREA
TOPICAL_CREAM | VAGINAL | 6 refills | Status: DC
Start: 2023-04-17 — End: 2023-07-20

## 2023-04-17 NOTE — Progress Notes (Unsigned)
77 y.o. G0P0000 Married White or Caucasian female here for breast and pelvic exam.  I am also following her for PMP .  Denies vaginal bleeding.  No LMP recorded (lmp unknown). Patient is postmenopausal.           Health Maintenance: PCP:  Dr. Ruthine Dose Vaccines are up to date:  yes Colonoscopy:  06/07/2019, no follow up recommended MMG:  10/14/2022 Negative BMD:  03/28/2019 Osteopenia Last pap smear:  04/17/2021 Negative.   H/o abnormal pap smear:  no    reports that she has never smoked. She has never used smokeless tobacco. She reports current alcohol use of about 7.0 standard drinks of alcohol per week. She reports that she does not use drugs.  Past Medical History:  Diagnosis Date   Allergy    Anemia    Anxiety    Arthritis    degenerative disc disorder   Cancer (HCC)    skin cancer   Cardiac arrhythmia    Cataract    surgery in right eye in 03/2022   Diverticulitis    Diverticulosis    GERD (gastroesophageal reflux disease)    Heart murmur    HTN (hypertension)    not on meds in 2 years per pt on 05/19/22 n   Infertility, female    Neuromuscular disorder (HCC)    idiopathic peripheral  neuropathy   Peripheral neuropathy    Positive PPD    SIADH (syndrome of inappropriate ADH production) (HCC)     Past Surgical History:  Procedure Laterality Date   BREAST BIOPSY Left    BREAST EXCISIONAL BIOPSY Left    benign   CATARACT EXTRACTION Right 2023   CERVICAL BIOPSY  W/ LOOP ELECTRODE EXCISION     CERVICAL POLYPECTOMY  02/2016   CHOLECYSTECTOMY N/A 06/02/2022   Procedure: LAPAROSCOPIC CHOLECYSTECTOMY WITH INTRAOPERATIVE CHOLANGIOGRAM;  Surgeon: Darnell Level, MD;  Location: [facility];  Service: General;  Laterality: N/A;   COLONOSCOPY     COLPOSCOPY     DILATION AND CURETTAGE OF UTERUS     HYSTEROSCOPY     LOOP RECORDER IMPLANT     LUMBAR DISC SURGERY     LUMBAR LAMINECTOMY     TONSILLECTOMY     UPPER GASTROINTESTINAL ENDOSCOPY      Current Outpatient Medications   Medication Sig Dispense Refill   ABRYSVO 120 MCG/0.5ML injection      acetaminophen (TYLENOL) 500 MG tablet Take 1,000 mg by mouth every 6 (six) hours as needed for mild pain or moderate pain.     Alum Hydroxide-Mag Carbonate (GAVISCON EXTRA STRENGTH) 160-105 MG CHEW Chew 2 tablets by mouth daily as needed (upset stomach).     apixaban (ELIQUIS) 5 MG TABS tablet Take 1 tablet (5 mg total) by mouth 2 (two) times daily. 60 tablet 11   betamethasone valerate (VALISONE) 0.1 % cream APPLY TWICE DAILY 45 g 0   betamethasone valerate ointment (VALISONE) 0.1 % Apply 1 Application topically 2 (two) times daily as needed (itching).     Cholecalciferol 50 MCG (2000 UT) CAPS Take 4,000 Units by mouth daily.     diazepam (VALIUM) 10 MG tablet TAKE ONE TABLET BY MOUTH EVERY TWELVE HOURS AS NEEDED FOR ANXIETY 60 tablet 4   gabapentin (NEURONTIN) 600 MG tablet TAKE ONE TABLET BY MOUTH THREE TIMES DAILY 270 tablet 1   hydrOXYzine (VISTARIL) 25 MG capsule Two capsules 4 times daily 720 capsule 1   mirabegron ER (MYRBETRIQ) 50 MG TB24 tablet TAKE ONE TABLET DAILY 30  tablet 2   omeprazole (PRILOSEC) 40 MG capsule TAKE ONE CAPSULE EACH DAY 90 capsule 1   ondansetron (ZOFRAN-ODT) 8 MG disintegrating tablet TAKE ONE TABLET EVERY EIGHT HOURS AS NEEDED FOR NAUSEA AND VOMITING 20 tablet 1   Polyethyl Glyc-Propyl Glyc PF (SYSTANE HYDRATION PF) 0.4-0.3 % SOLN Place 1 drop into both eyes daily as needed (dry eyes).     Pramoxine HCl (CERAVE ITCH RELIEF) 1 % CREA Apply 1 Application topically daily as needed (itching).     Probiotic Product (ALIGN PO) Take 1 capsule by mouth daily.     Pumpkin Seed-Soy Germ (AZO BLADDER CONTROL/GO-LESS PO) Take 1 capsule by mouth in the morning, at noon, and at bedtime.     sodium chloride 1 g tablet Take 1 g by mouth daily.     tacrolimus (PROTOPIC) 0.1 % ointment Apply topically 2 (two) times daily.     tazarotene (AVAGE) 0.1 % cream Apply topically.     triamcinolone cream (KENALOG) 0.1  % Apply 1 Application topically as needed (itching).     zolpidem (AMBIEN) 10 MG tablet TAKE ONE TABLET BY MOUTH AT BEDTIME AS NEEDED FOR SLEEP 30 tablet 0   No current facility-administered medications for this visit.    Family History  Problem Relation Age of Onset   Breast cancer Mother    Glaucoma Mother    Miscarriages / Stillbirths Mother    Heart attack Father    Depression Father    Early death Father    Heart disease Father    Tuberculosis Paternal Grandmother    Prostate cancer Paternal Grandfather    Depression Brother    Hypertension Brother    Colon cancer Neg Hx    Esophageal cancer Neg Hx    Rectal cancer Neg Hx    Stomach cancer Neg Hx     Review of Systems  Constitutional: Negative.   Genitourinary:  Positive for urgency.    Exam:   BP 132/61 (BP Location: Right Arm, Patient Position: Sitting, Cuff Size: Large)   Pulse 77   Ht 5\' 5"  (1.651 m) Comment: Reported  LMP  (LMP Unknown)   BMI 28.96 kg/m   Height: 5\' 5"  (165.1 cm) (Reported)  General appearance: alert, cooperative and appears stated age Breasts: normal appearance, no masses or tenderness Abdomen: soft, non-tender; bowel sounds normal; no masses,  no organomegaly Lymph nodes: Cervical, supraclavicular, and axillary nodes normal.  No abnormal inguinal nodes palpated Neurologic: Grossly normal  Pelvic: External genitalia:  no lesions              Urethra:  normal appearing urethra with no masses, tenderness or lesions              Bartholins and Skenes: normal                 Vagina: normal appearing vagina with atrophic changes and no discharge, no lesions              Cervix: no lesions              Pap taken: No. Bimanual Exam:  Uterus:  normal size, contour, position, consistency, mobility, non-tender              Adnexa: normal adnexa and no mass, fullness, tenderness               Rectovaginal: Confirms               Anus:  normal sphincter tone, no  lesions  Chaperone, Ina Homes,  CMA, was present for exam.  Assessment/Plan: There are no diagnoses linked to this encounter.  ultrasound

## 2023-04-18 ENCOUNTER — Encounter (HOSPITAL_BASED_OUTPATIENT_CLINIC_OR_DEPARTMENT_OTHER): Payer: Self-pay | Admitting: Obstetrics & Gynecology

## 2023-04-27 ENCOUNTER — Ambulatory Visit (INDEPENDENT_AMBULATORY_CARE_PROVIDER_SITE_OTHER): Payer: Medicare Other

## 2023-04-27 DIAGNOSIS — I480 Paroxysmal atrial fibrillation: Secondary | ICD-10-CM

## 2023-04-28 ENCOUNTER — Other Ambulatory Visit (INDEPENDENT_AMBULATORY_CARE_PROVIDER_SITE_OTHER): Payer: Medicare Other

## 2023-04-28 DIAGNOSIS — R7989 Other specified abnormal findings of blood chemistry: Secondary | ICD-10-CM

## 2023-04-29 ENCOUNTER — Ambulatory Visit (HOSPITAL_BASED_OUTPATIENT_CLINIC_OR_DEPARTMENT_OTHER): Admission: RE | Admit: 2023-04-29 | Payer: Medicare Other | Source: Ambulatory Visit

## 2023-04-29 NOTE — Progress Notes (Signed)
Better  

## 2023-04-30 ENCOUNTER — Other Ambulatory Visit (HOSPITAL_BASED_OUTPATIENT_CLINIC_OR_DEPARTMENT_OTHER): Payer: Self-pay | Admitting: Obstetrics & Gynecology

## 2023-05-05 ENCOUNTER — Encounter: Payer: Self-pay | Admitting: Cardiovascular Disease

## 2023-05-11 ENCOUNTER — Other Ambulatory Visit: Payer: Self-pay | Admitting: Family Medicine

## 2023-05-12 NOTE — Progress Notes (Signed)
Carelink Summary Report / Loop Recorder 

## 2023-05-22 ENCOUNTER — Other Ambulatory Visit: Payer: Self-pay | Admitting: *Deleted

## 2023-05-22 ENCOUNTER — Encounter: Payer: Self-pay | Admitting: Family Medicine

## 2023-05-22 DIAGNOSIS — E559 Vitamin D deficiency, unspecified: Secondary | ICD-10-CM

## 2023-05-22 DIAGNOSIS — R7989 Other specified abnormal findings of blood chemistry: Secondary | ICD-10-CM

## 2023-05-22 DIAGNOSIS — I10 Essential (primary) hypertension: Secondary | ICD-10-CM

## 2023-06-01 ENCOUNTER — Ambulatory Visit: Payer: Medicare Other

## 2023-06-01 DIAGNOSIS — I480 Paroxysmal atrial fibrillation: Secondary | ICD-10-CM

## 2023-06-01 LAB — CUP PACEART REMOTE DEVICE CHECK
Date Time Interrogation Session: 20240906230747
Implantable Pulse Generator Implant Date: 20230614

## 2023-06-02 ENCOUNTER — Other Ambulatory Visit (INDEPENDENT_AMBULATORY_CARE_PROVIDER_SITE_OTHER): Payer: Medicare Other

## 2023-06-02 DIAGNOSIS — E559 Vitamin D deficiency, unspecified: Secondary | ICD-10-CM | POA: Diagnosis not present

## 2023-06-02 DIAGNOSIS — E222 Syndrome of inappropriate secretion of antidiuretic hormone: Secondary | ICD-10-CM | POA: Diagnosis not present

## 2023-06-02 DIAGNOSIS — Z79899 Other long term (current) drug therapy: Secondary | ICD-10-CM

## 2023-06-02 LAB — VITAMIN D 25 HYDROXY (VIT D DEFICIENCY, FRACTURES): VITD: 56.12 ng/mL (ref 30.00–100.00)

## 2023-06-02 NOTE — Progress Notes (Signed)
Vitamin D is normal. Although I have old orders in there for BMP, it does not look like that got done.  Please see if lab can add it

## 2023-06-03 ENCOUNTER — Encounter: Payer: Self-pay | Admitting: Family Medicine

## 2023-06-03 LAB — BASIC METABOLIC PANEL
BUN: 22 mg/dL (ref 6–23)
CO2: 25 meq/L (ref 19–32)
Calcium: 9.2 mg/dL (ref 8.4–10.5)
Chloride: 100 meq/L (ref 96–112)
Creatinine, Ser: 1.06 mg/dL (ref 0.40–1.20)
GFR: 50.81 mL/min — ABNORMAL LOW (ref >60.00)
Glucose, Bld: 81 mg/dL (ref 70–99)
Potassium: 4.4 meq/L (ref 3.5–5.1)
Sodium: 134 meq/L — ABNORMAL LOW (ref 135–145)

## 2023-06-03 NOTE — Progress Notes (Signed)
Great.  Continue same.  Repeat 1 month

## 2023-06-12 ENCOUNTER — Other Ambulatory Visit: Payer: Self-pay | Admitting: Internal Medicine

## 2023-06-18 NOTE — Progress Notes (Signed)
Carelink Summary Report / Loop Recorder 

## 2023-07-06 ENCOUNTER — Ambulatory Visit (INDEPENDENT_AMBULATORY_CARE_PROVIDER_SITE_OTHER): Payer: Medicare Other

## 2023-07-06 DIAGNOSIS — I480 Paroxysmal atrial fibrillation: Secondary | ICD-10-CM | POA: Diagnosis not present

## 2023-07-06 LAB — CUP PACEART REMOTE DEVICE CHECK
Date Time Interrogation Session: 20241013231809
Implantable Pulse Generator Implant Date: 20230614

## 2023-07-09 DIAGNOSIS — R8290 Unspecified abnormal findings in urine: Secondary | ICD-10-CM | POA: Diagnosis not present

## 2023-07-18 ENCOUNTER — Other Ambulatory Visit: Payer: Self-pay | Admitting: Family Medicine

## 2023-07-18 NOTE — Telephone Encounter (Signed)
Needs appt within 30 days-since on controlled meds, needs appt q 6 months

## 2023-07-20 ENCOUNTER — Telehealth: Payer: Self-pay | Admitting: Family Medicine

## 2023-07-20 ENCOUNTER — Encounter: Payer: Self-pay | Admitting: Family

## 2023-07-20 ENCOUNTER — Ambulatory Visit (INDEPENDENT_AMBULATORY_CARE_PROVIDER_SITE_OTHER): Payer: Medicare Other | Admitting: Family

## 2023-07-20 VITALS — BP 159/87 | HR 93 | Temp 98.0°F | Ht 65.0 in

## 2023-07-20 DIAGNOSIS — Z79899 Other long term (current) drug therapy: Secondary | ICD-10-CM

## 2023-07-20 DIAGNOSIS — N3090 Cystitis, unspecified without hematuria: Secondary | ICD-10-CM | POA: Diagnosis not present

## 2023-07-20 DIAGNOSIS — N94819 Vulvodynia, unspecified: Secondary | ICD-10-CM

## 2023-07-20 DIAGNOSIS — E222 Syndrome of inappropriate secretion of antidiuretic hormone: Secondary | ICD-10-CM

## 2023-07-20 LAB — POCT URINALYSIS DIPSTICK
Bilirubin, UA: NEGATIVE
Blood, UA: POSITIVE — AB
Glucose, UA: NEGATIVE
Ketones, UA: NEGATIVE
Nitrite, UA: NEGATIVE
Protein, UA: NEGATIVE
Spec Grav, UA: 1.015 (ref 1.010–1.025)
Urobilinogen, UA: 0.2 U/dL
pH, UA: 6 (ref 5.0–8.0)

## 2023-07-20 MED ORDER — ESTRADIOL 0.1 MG/GM VA CREA: TOPICAL_CREAM | VAGINAL | 1 refills | Status: DC

## 2023-07-20 MED ORDER — CEPHALEXIN 500 MG PO CAPS: 500.0000 mg | ORAL_CAPSULE | Freq: Three times a day (TID) | ORAL | 0 refills | Status: AC

## 2023-07-20 NOTE — Telephone Encounter (Signed)
Patient Advised: See PCP Within 4 Hours  Patient is scheduled with Dulce Sellar today at 2:40 pm  Patient Name First: Katrina Benitez Last: Katen Gender: Female DOB: 1946/03/29 Age: 77 Y 2 M 25 D Return Phone Number: [phone] (Primary), [phone] (Secondary) Address: City/ State/ Zip: [place]  [address] Client [facility] - [facility], [facility] Provider Ruthine Dose, Ann Contact Type Call Who Is Calling Patient / Member / Family / Caregiver Call Type Triage / Clinical Caller Name LINDA ALBRIGHT Relationship To Patient Provider Return Phone Number [phone] (Primary) Chief Complaint Urine, Blood In Reason for Call Symptomatic / Request for Health Information Initial Comment Caller states patient has painful urination with blood in her urine since this morning. Translation No Nurse Assessment Nurse: Lily Kocher, RN, Adriana Date/Time (Eastern Time): 07/20/2023 12:04:41 PM Confirm and document reason for call. If symptomatic, describe symptoms. ---pt states she has had urinary issues for 2 years intermittently. pt was recently prescribed estrogen by a specialist for possible dryness. over the weekend she had urgency, bleeding, and incontinence. bleeding has stopped but urine is cloudy. Does the patient have any new or worsening symptoms? ---Yes Will a triage be completed? ---Yes Related visit to physician within the last 2 weeks? ---Yes Does the PT have any chronic conditions? (i.e. diabetes, asthma, this includes High risk factors for pregnancy, etc.) ---Yes List chronic conditions. ---siadh Is this a behavioral health or substance abuse call? ---No Guidelines Guideline Title Affirmed Question Affirmed Notes Nurse Date/Time (Eastern Time) Urination Pain - Female [1] SEVERE pain with urination (e.g., excruciating) AND [2] not improved after 2 hours of pain Lily Kocher, RN, Ricki Rodriguez 07/20/2023  12:11:22 PM  Guidelines Guideline Title Affirmed Question Affirmed Notes Nurse Date/Time Lamount Cohen Time) medicine and Sitz bath Disp. Time Lamount Cohen Time) Disposition Final User 07/20/2023 12:13:22 PM See HCP within 4 Hours (or PCP triage) Yes Lily Kocher, RN, Adriana Final Disposition 07/20/2023 12:13:22 PM See HCP within 4 Hours (or PCP triage) Yes Lily Kocher, RN, Ricki Rodriguez Caller Disagree/Comply Comply Caller Understands Yes PreDisposition Call Doctor Care Advice Given Per Guideline SEE HCP (OR PCP TRIAGE) WITHIN 4 HOURS: * IF OFFICE WILL BE OPEN: You need to be seen within the next 3 or 4 hours. Call your doctor (or NP/PA) now or as soon as the office opens. CALL BACK IF: * You become worse CARE ADVICE given per Urination Pain - Female (Adult) guideline.  Referrals REFERRED TO PCP OFFICE

## 2023-07-20 NOTE — Telephone Encounter (Signed)
FYI: This call has been transferred to triage nurse: the Triage Nurse. Once the result note has been entered staff can address the message at that time.  Patient called in with the following symptoms:  Red Word: Frequent, very painful urination. Blood in urine until this morning   Please advise at Mobile [phone] (mobile)  Message is routed to Provider Pool.

## 2023-07-20 NOTE — Telephone Encounter (Signed)
LVM informing pt of message below.

## 2023-07-20 NOTE — Telephone Encounter (Signed)
Noted  

## 2023-07-20 NOTE — Progress Notes (Signed)
Patient ID: Katrina Benitez, female    DOB: Oct 26, 1945, 77 y.o.   MRN: [id number]  Chief Complaint  Patient presents with   Dysuria    Pt c/o painful urination with blood in her urine since this morning, Present for 3 days.     Discussed the use of AI scribe software for clinical note transcription with the patient, who gave verbal consent to proceed.  History of Present Illness   The patient, with a history of UTIs and vaginal pain, presents with severe dysuria, urinary frequency, and urgency that has worsened over the past 5-7 days. The patient also reports a recent episode of gross hematuria following a failed attempt to self-administer a vaginal estrogen tablet. The patient has been on Myrbetriq for urinary incontinence and Azo for dysuria, both of which she stopped about two weeks ago due to perceived lack of efficacy. She has also been on Vagifem vaginal tablets for vaginal dryness & possibly to help prevent dysuria d/t UTIs, , which she found difficult to administer. The patient has no history of kidney stones or pelvic surgery. A urinalysis done ten days ago was clear at her Urogynecology office.     Assessment & Plan:     Urinary Symptoms - Severe dysuria, frequency, and urgency with associated urinary incontinence. Recent history of gross hematuria likely secondary to trauma from vaginal tablet insertion. Mild urinary tract infection detected on current visit. Last GFR 50 1 month ago, BMP checked again today by PCP. -Start Cephalexin for urinary tract infection, advised on use & SE. -Send urine out for culture, will advise pt of results. -Consider resuming over-the-counter Azo for symptomatic relief of dysuria x 2days only. -F/U with Urogynecologist or PCP for ongoing symptoms.  Vaginal Dryness - Difficulty with Vagifem vaginal tablet insertion due to device design. -Discontinue Vagifem tablets. -Start Estrace cream as an alternative for vaginal dryness, advised ok to use small  amount on finger to apply if difficulty with applicator.     Subjective:    Outpatient Medications Prior to Visit  Medication Sig Dispense Refill   ABRYSVO 120 MCG/0.5ML injection      acetaminophen (TYLENOL) 500 MG tablet Take 1,000 mg by mouth every 6 (six) hours as needed for mild pain or moderate pain.     Alum Hydroxide-Mag Carbonate (GAVISCON EXTRA STRENGTH) 160-105 MG CHEW Chew 2 tablets by mouth daily as needed (upset stomach).     apixaban (ELIQUIS) 5 MG TABS tablet Take 1 tablet (5 mg total) by mouth 2 (two) times daily. 60 tablet 11   betamethasone valerate (VALISONE) 0.1 % cream APPLY TWICE DAILY 45 g 0   betamethasone valerate ointment (VALISONE) 0.1 % Apply 1 Application topically 2 (two) times daily as needed (itching).     Cholecalciferol 50 MCG (2000 UT) CAPS Take 4,000 Units by mouth daily.     diazepam (VALIUM) 10 MG tablet TAKE ONE TABLET EVERY TWELVE HOURS AS NEEDED FOR ANXIETY 60 tablet 0   estradiol (ESTRACE) 0.1 MG/GM vaginal cream Apply pea sized amount of cream to urethra three times weeklys 42.5 g 6   gabapentin (NEURONTIN) 600 MG tablet TAKE ONE TABLET BY MOUTH THREE TIMES DAILY 270 tablet 1   hydrOXYzine (VISTARIL) 25 MG capsule Two capsules 4 times daily 720 capsule 1   MYRBETRIQ 50 MG TB24 tablet TAKE ONE TABLET DAILY 30 tablet 2   omeprazole (PRILOSEC) 40 MG capsule TAKE ONE CAPSULE EACH DAY 90 capsule 1   ondansetron (ZOFRAN-ODT) 8 MG disintegrating  tablet TAKE ONE TABLET EVERY EIGHT HOURS AS NEEDED FOR NAUSEA AND VOMITING 20 tablet 1   Polyethyl Glyc-Propyl Glyc PF (SYSTANE HYDRATION PF) 0.4-0.3 % SOLN Place 1 drop into both eyes daily as needed (dry eyes).     Pramoxine HCl (CERAVE ITCH RELIEF) 1 % CREA Apply 1 Application topically daily as needed (itching).     Probiotic Product (ALIGN PO) Take 1 capsule by mouth daily.     Pumpkin Seed-Soy Germ (AZO BLADDER CONTROL/GO-LESS PO) Take 1 capsule by mouth in the morning, at noon, and at bedtime.     sodium  chloride 1 g tablet Take 1 g by mouth daily.     tacrolimus (PROTOPIC) 0.1 % ointment Apply topically 2 (two) times daily.     triamcinolone cream (KENALOG) 0.1 % Apply 1 Application topically as needed (itching).     zolpidem (AMBIEN) 10 MG tablet TAKE ONE TABLET BY MOUTH AT BEDTIME AS NEEDED FOR SLEEP 30 tablet 1   No facility-administered medications prior to visit.   Past Medical History:  Diagnosis Date   Allergy    Anemia    Anxiety    Arthritis    degenerative disc disorder   Cancer (HCC)    skin cancer   Cardiac arrhythmia    Cataract    surgery in right eye in 03/2022   Diverticulitis    Diverticulosis    GERD (gastroesophageal reflux disease)    Heart murmur    HTN (hypertension)    not on meds in 2 years per pt on 05/19/22 n   Infertility, female    Neuromuscular disorder (HCC)    idiopathic peripheral  neuropathy   Peripheral neuropathy    Positive PPD    SIADH (syndrome of inappropriate ADH production) (HCC)    Past Surgical History:  Procedure Laterality Date   BREAST BIOPSY Left    BREAST EXCISIONAL BIOPSY Left    benign   CATARACT EXTRACTION Right 2023   CERVICAL BIOPSY  W/ LOOP ELECTRODE EXCISION     CERVICAL POLYPECTOMY  02/2016   CHOLECYSTECTOMY N/A 06/02/2022   Procedure: LAPAROSCOPIC CHOLECYSTECTOMY WITH INTRAOPERATIVE CHOLANGIOGRAM;  Surgeon: Darnell Level, MD;  Location: [facility];  Service: General;  Laterality: N/A;   COLONOSCOPY     COLPOSCOPY     DILATION AND CURETTAGE OF UTERUS     HYSTEROSCOPY     LOOP RECORDER IMPLANT     LUMBAR DISC SURGERY     LUMBAR LAMINECTOMY     TONSILLECTOMY     UPPER GASTROINTESTINAL ENDOSCOPY     Allergies  Allergen Reactions   Flagyl [Metronidazole] Hives   Hydromorphone Hives   Iodixanol Other (See Comments) and Swelling    Throat swelling  Other Reaction(s): Other (See Comments)  Throat swelling, Throat swelling, Throat swelling, Other reaction(s): Other, Other (See Comments), Throat swelling, Throat  swelling, Throat swelling, Throat swelling, Throat swelling   Fentanyl Itching   Cipro [Ciprofloxacin Hcl] Hives   Dilaudid [Hydromorphone Hcl] Hives      Objective:    Physical Exam Vitals and nursing note reviewed.  Constitutional:      Appearance: Normal appearance.  Cardiovascular:     Rate and Rhythm: Normal rate and regular rhythm.  Pulmonary:     Effort: Pulmonary effort is normal.     Breath sounds: Normal breath sounds.  Musculoskeletal:        General: Normal range of motion.  Skin:    General: Skin is warm and dry.  Neurological:  Mental Status: She is alert.  Psychiatric:        Mood and Affect: Mood normal.        Behavior: Behavior normal.    BP (!) 159/87 (BP Location: Left Arm, Patient Position: Sitting, Cuff Size: Normal)   Pulse 93   Temp 98 F (36.7 C) (Temporal)   Ht 5\' 5"  (1.651 m)   LMP  (LMP Unknown)   SpO2 98%   BMI 28.96 kg/m  Wt Readings from Last 3 Encounters:  03/12/23 174 lb (78.9 kg)  03/10/23 180 lb (81.6 kg)  09/17/22 180 lb (81.6 kg)      Dulce Sellar, NP

## 2023-07-21 ENCOUNTER — Telehealth: Payer: Self-pay | Admitting: Family Medicine

## 2023-07-21 LAB — BASIC METABOLIC PANEL
BUN: 20 mg/dL (ref 6–23)
CO2: 24 meq/L (ref 19–32)
Calcium: 8.9 mg/dL (ref 8.4–10.5)
Chloride: 98 meq/L (ref 96–112)
Creatinine, Ser: 0.94 mg/dL (ref 0.40–1.20)
GFR: 58.64 mL/min — ABNORMAL LOW (ref >60.00)
Glucose, Bld: 154 mg/dL — ABNORMAL HIGH (ref 70–99)
Potassium: 4.7 meq/L (ref 3.5–5.1)
Sodium: 132 meq/L — ABNORMAL LOW (ref 135–145)

## 2023-07-21 NOTE — Telephone Encounter (Signed)
Patient called back stating she changed her mind and would like to trial the pyridium that was suggested during yesterday's visit.

## 2023-07-22 ENCOUNTER — Ambulatory Visit: Payer: Medicare Other | Admitting: Family Medicine

## 2023-07-22 LAB — URINE CULTURE
MICRO NUMBER:: 15651471
SPECIMEN QUALITY:: ADEQUATE

## 2023-07-22 NOTE — Telephone Encounter (Signed)
FYI-- Pt had to r/s 10/30 OV for 11/1 due to conflicting appointments.

## 2023-07-22 NOTE — Progress Notes (Signed)
Urine culture indicates E. Coli bacteria & Nitrofurantoin she is taking should treat this.

## 2023-07-23 ENCOUNTER — Other Ambulatory Visit: Payer: Self-pay | Admitting: Family Medicine

## 2023-07-23 MED ORDER — PHENAZOPYRIDINE HCL 100 MG PO TABS: 100.0000 mg | ORAL_TABLET | Freq: Three times a day (TID) | ORAL | 0 refills | Status: DC | PRN

## 2023-07-23 NOTE — Telephone Encounter (Signed)
Patient called back stating she changed her mind and would like to trial the pyridium that was suggested during last visit.

## 2023-07-23 NOTE — Telephone Encounter (Signed)
Please see message below and advise.

## 2023-07-23 NOTE — Progress Notes (Signed)
Carelink Summary Report / Loop Recorder 

## 2023-07-24 ENCOUNTER — Ambulatory Visit: Payer: Medicare Other | Admitting: Family Medicine

## 2023-07-24 NOTE — Telephone Encounter (Signed)
Patient notified and verbalized understanding. 

## 2023-07-24 NOTE — Telephone Encounter (Signed)
Patient called back and stated that she is still having the burning. She picked up OTC AZO after talking to the pharmacist and they told her that was the generic for the pyridium, instructions say to take for three days, she wanted to know if she could take it longer. Patient notified that Rx was sent to the pharmacy, she stated okay, but please ask about the OTC. Please advise.

## 2023-07-26 NOTE — Progress Notes (Signed)
Stable  sugar elevated.  Was going to d/w pt at appt, but she resch

## 2023-07-28 ENCOUNTER — Ambulatory Visit: Payer: Medicare Other | Admitting: Family Medicine

## 2023-07-29 ENCOUNTER — Encounter: Payer: Self-pay | Admitting: Internal Medicine

## 2023-07-29 ENCOUNTER — Encounter: Payer: Self-pay | Admitting: Family Medicine

## 2023-07-29 ENCOUNTER — Ambulatory Visit: Payer: Medicare Other | Attending: Internal Medicine | Admitting: Internal Medicine

## 2023-07-29 ENCOUNTER — Ambulatory Visit: Payer: Medicare Other | Admitting: Family Medicine

## 2023-07-29 VITALS — BP 126/68 | HR 84 | Ht 65.0 in

## 2023-07-29 VITALS — BP 144/78 | HR 85 | Temp 98.6°F | Resp 18 | Ht 65.0 in

## 2023-07-29 DIAGNOSIS — R7303 Prediabetes: Secondary | ICD-10-CM | POA: Diagnosis not present

## 2023-07-29 DIAGNOSIS — E222 Syndrome of inappropriate secretion of antidiuretic hormone: Secondary | ICD-10-CM

## 2023-07-29 DIAGNOSIS — L299 Pruritus, unspecified: Secondary | ICD-10-CM | POA: Diagnosis not present

## 2023-07-29 DIAGNOSIS — I480 Paroxysmal atrial fibrillation: Secondary | ICD-10-CM | POA: Insufficient documentation

## 2023-07-29 DIAGNOSIS — F419 Anxiety disorder, unspecified: Secondary | ICD-10-CM

## 2023-07-29 DIAGNOSIS — G609 Hereditary and idiopathic neuropathy, unspecified: Secondary | ICD-10-CM | POA: Diagnosis not present

## 2023-07-29 DIAGNOSIS — B0229 Other postherpetic nervous system involvement: Secondary | ICD-10-CM | POA: Diagnosis not present

## 2023-07-29 DIAGNOSIS — F5101 Primary insomnia: Secondary | ICD-10-CM

## 2023-07-29 DIAGNOSIS — N94819 Vulvodynia, unspecified: Secondary | ICD-10-CM | POA: Diagnosis not present

## 2023-07-29 NOTE — Patient Instructions (Addendum)

## 2023-07-29 NOTE — Patient Instructions (Signed)

## 2023-07-29 NOTE — Progress Notes (Signed)
Subjective:    Patient ID: Katrina Benitez, female    DOB: [birth year], 77 y.o.   MRN: [id number]  Chief Complaint  Patient presents with   Medical Management of Chronic Issues    6 month follow-up on medication Still having burning in the mornings when urinating  Accompanied by her husband.   HPI Lower Abdominal/ Vaginal Burning -  Since visit in Oct, burning during urination has stopped but vaginal burning has continued. Before getting out of bed, she has to wait for the burning to stop before she can move. Burning only occurs in the morning. UTI dx 07/20/23.  Anxiety- Has been on multiple medications  with no improvement. Takes Valium 10 mg up to three times daily. No SI.   Insomnia- Occasionally takes Ambien per patient. States she is taking only twice a month. Chronic, problems since childhood.   Atrial fibrillation- Taking eliquis 5mg  BID.  Neuropathy - Taking gabapentin 600mg  three times daily. Responding well.   Itching-Dx chronic-adult pruritus unknown origin from Dermatology. Takes vistaril 50 mg 4x/day.   HTN - Bp at initial check was 154/84. Bp at recheck was 144/78. Pt is on nothing per her request.   There are no preventive care reminders to display for this patient.   Past Medical History:  Diagnosis Date   Allergy    Anemia    Anxiety    Arthritis    degenerative disc disorder   Cancer (HCC)    skin cancer   Cardiac arrhythmia    Cataract    surgery in right eye in 03/2022   Diverticulitis    Diverticulosis    GERD (gastroesophageal reflux disease)    Heart murmur    HTN (hypertension)    not on meds in 2 years per pt on 05/19/22 n   Infertility, female    Neuromuscular disorder (HCC)    idiopathic peripheral  neuropathy   Peripheral neuropathy    Positive PPD    SIADH (syndrome of inappropriate ADH production) (HCC)     Past Surgical History:  Procedure Laterality Date   BREAST BIOPSY Left    BREAST EXCISIONAL BIOPSY Left    benign   CATARACT  EXTRACTION Right 2023   CERVICAL BIOPSY  W/ LOOP ELECTRODE EXCISION     CERVICAL POLYPECTOMY  02/2016   CHOLECYSTECTOMY N/A 06/02/2022   Procedure: LAPAROSCOPIC CHOLECYSTECTOMY WITH INTRAOPERATIVE CHOLANGIOGRAM;  Surgeon: Darnell Level, MD;  Location: [facility];  Service: General;  Laterality: N/A;   COLONOSCOPY     COLPOSCOPY     DILATION AND CURETTAGE OF UTERUS     HYSTEROSCOPY     LOOP RECORDER IMPLANT     LUMBAR DISC SURGERY     LUMBAR LAMINECTOMY     TONSILLECTOMY     UPPER GASTROINTESTINAL ENDOSCOPY       Current Outpatient Medications:    acetaminophen (TYLENOL) 500 MG tablet, Take 1,000 mg by mouth every 6 (six) hours as needed for mild pain or moderate pain., Disp: , Rfl:    Alum Hydroxide-Mag Carbonate (GAVISCON EXTRA STRENGTH) 160-105 MG CHEW, Chew 2 tablets by mouth daily as needed (upset stomach)., Disp: , Rfl:    apixaban (ELIQUIS) 5 MG TABS tablet, Take 1 tablet (5 mg total) by mouth 2 (two) times daily., Disp: 60 tablet, Rfl: 11   betamethasone valerate (VALISONE) 0.1 % cream, APPLY TWICE DAILY, Disp: 45 g, Rfl: 0   betamethasone valerate ointment (VALISONE) 0.1 %, Apply 1 Application topically 2 (two) times daily as  needed (itching)., Disp: , Rfl:    Cholecalciferol 50 MCG (2000 UT) CAPS, Take 4,000 Units by mouth daily., Disp: , Rfl:    diazepam (VALIUM) 10 MG tablet, TAKE ONE TABLET EVERY TWELVE HOURS AS NEEDED FOR ANXIETY, Disp: 60 tablet, Rfl: 0   estradiol (ESTRACE) 0.1 MG/GM vaginal cream, 0.5 g intravaginally 1-3 times per week., Disp: 42.5 g, Rfl: 1   gabapentin (NEURONTIN) 600 MG tablet, TAKE ONE TABLET BY MOUTH THREE TIMES DAILY, Disp: 270 tablet, Rfl: 1   hydrOXYzine (VISTARIL) 25 MG capsule, Two capsules 4 times daily, Disp: 720 capsule, Rfl: 1   omeprazole (PRILOSEC) 40 MG capsule, TAKE ONE CAPSULE EACH DAY, Disp: 90 capsule, Rfl: 1   ondansetron (ZOFRAN-ODT) 8 MG disintegrating tablet, TAKE ONE TABLET EVERY EIGHT HOURS AS NEEDED FOR NAUSEA AND VOMITING, Disp:  20 tablet, Rfl: 1   phenazopyridine (PYRIDIUM) 100 MG tablet, Take 1 tablet (100 mg total) by mouth 3 (three) times daily as needed for pain., Disp: 8 tablet, Rfl: 0   Polyethyl Glyc-Propyl Glyc PF (SYSTANE HYDRATION PF) 0.4-0.3 % SOLN, Place 1 drop into both eyes daily as needed (dry eyes)., Disp: , Rfl:    Pramoxine HCl (CERAVE ITCH RELIEF) 1 % CREA, Apply 1 Application topically daily as needed (itching)., Disp: , Rfl:    Probiotic Product (ALIGN PO), Take 1 capsule by mouth daily., Disp: , Rfl:    Pumpkin Seed-Soy Germ (AZO BLADDER CONTROL/GO-LESS PO), Take 1 capsule by mouth in the morning, at noon, and at bedtime., Disp: , Rfl:    sodium chloride 1 g tablet, Take 1 g by mouth daily., Disp: , Rfl:    tacrolimus (PROTOPIC) 0.1 % ointment, Apply topically 2 (two) times daily., Disp: , Rfl:    triamcinolone cream (KENALOG) 0.1 %, Apply 1 Application topically as needed (itching)., Disp: , Rfl:    zolpidem (AMBIEN) 10 MG tablet, TAKE ONE TABLET BY MOUTH AT BEDTIME AS NEEDED FOR SLEEP, Disp: 30 tablet, Rfl: 1  Allergies  Allergen Reactions   Flagyl [Metronidazole] Hives   Hydromorphone Hives   Iodixanol Other (See Comments) and Swelling    Throat swelling  Other Reaction(s): Other (See Comments)  Throat swelling, Throat swelling, Throat swelling, Other reaction(s): Other, Other (See Comments), Throat swelling, Throat swelling, Throat swelling, Throat swelling, Throat swelling   Fentanyl Itching   Cipro [Ciprofloxacin Hcl] Hives   Dilaudid [Hydromorphone Hcl] Hives   ROS neg/noncontributory except as noted HPI/below      Objective:     BP (!) 144/78 (BP Location: Left Arm)   Pulse 85   Temp 98.6 F (37 C) (Temporal)   Resp 18   Ht 5\' 5"  (1.651 m)   LMP  (LMP Unknown)   SpO2 98%   BMI 28.96 kg/m  Wt Readings from Last 3 Encounters:  03/12/23 174 lb (78.9 kg)  03/10/23 180 lb (81.6 kg)  09/17/22 180 lb (81.6 kg)    Physical Exam   Gen: WDWN NAD HEENT: NCAT, conjunctiva  not injected, sclera nonicteric CARDIAC: RRR, S1S2+, no murmur. DP 2+B LUNGS: CTAB. No wheezes EXT:  no edema MSK: no gross abnormalities.  NEURO: A&O x3.  CN II-XII intact.  PSYCH: normal mood. Good eye contact  Refer to urology *** Renew valium ***    Assessment & Plan:  Prediabetes -     Comprehensive metabolic panel; Future -     Hemoglobin A1c; Future   Return in about 6 months (around 01/26/2024) for chronic follow-up.  Germaine Pomfret  Rice,acting as a scribe for Angelena Sole, MD.,have documented all relevant documentation on the behalf of Angelena Sole, MD,as directed by  Angelena Sole, MD while in the presence of Angelena Sole, MD.  Juleen Starr, have reviewed all documentation for this visit. The documentation on 07/29/23 for the exam, diagnosis, procedures, and orders are all accurate and complete. ***  Anaiya N Rice

## 2023-07-29 NOTE — Progress Notes (Signed)
HPI Dr. Lucretia Roers returns today for followup. She is a pleasant 77 yo woman with a h/o atrial fib when she developed Covid pneumonia, about 3 years ago. She has palpitations and a h/o syncope. She denies chest pain or sob. She is not a good candidate for systemic anti-coagulation due to falls but has been maintained on Eliquis after ILR was inserted and showed more atrial fib.  She has minimal symptoms in atrial fib. Rare palpitations.  She has been bothered by pruritus but this has improved with medical therapy.  Allergies  Allergen Reactions   Flagyl [Metronidazole] Hives   Hydromorphone Hives   Iodixanol Other (See Comments) and Swelling    Throat swelling  Other Reaction(s): Other (See Comments)  Throat swelling, Throat swelling, Throat swelling, Other reaction(s): Other, Other (See Comments), Throat swelling, Throat swelling, Throat swelling, Throat swelling, Throat swelling   Fentanyl Itching   Cipro [Ciprofloxacin Hcl] Hives   Dilaudid [Hydromorphone Hcl] Hives     Current Outpatient Medications  Medication Sig Dispense Refill   acetaminophen (TYLENOL) 500 MG tablet Take 1,000 mg by mouth every 6 (six) hours as needed for mild pain or moderate pain.     Alum Hydroxide-Mag Carbonate (GAVISCON EXTRA STRENGTH) 160-105 MG CHEW Chew 2 tablets by mouth daily as needed (upset stomach).     apixaban (ELIQUIS) 5 MG TABS tablet Take 1 tablet (5 mg total) by mouth 2 (two) times daily. 60 tablet 11   betamethasone valerate (VALISONE) 0.1 % cream APPLY TWICE DAILY 45 g 0   betamethasone valerate ointment (VALISONE) 0.1 % Apply 1 Application topically 2 (two) times daily as needed (itching).     Cholecalciferol 50 MCG (2000 UT) CAPS Take 4,000 Units by mouth daily.     diazepam (VALIUM) 10 MG tablet TAKE ONE TABLET EVERY TWELVE HOURS AS NEEDED FOR ANXIETY 60 tablet 0   estradiol (ESTRACE) 0.1 MG/GM vaginal cream 0.5 g intravaginally 1-3 times per week. 42.5 g 1   gabapentin (NEURONTIN) 600  MG tablet TAKE ONE TABLET BY MOUTH THREE TIMES DAILY 270 tablet 1   hydrOXYzine (VISTARIL) 25 MG capsule Two capsules 4 times daily 720 capsule 1   omeprazole (PRILOSEC) 40 MG capsule TAKE ONE CAPSULE EACH DAY 90 capsule 1   ondansetron (ZOFRAN-ODT) 8 MG disintegrating tablet TAKE ONE TABLET EVERY EIGHT HOURS AS NEEDED FOR NAUSEA AND VOMITING 20 tablet 1   phenazopyridine (PYRIDIUM) 100 MG tablet Take 1 tablet (100 mg total) by mouth 3 (three) times daily as needed for pain. 8 tablet 0   Polyethyl Glyc-Propyl Glyc PF (SYSTANE HYDRATION PF) 0.4-0.3 % SOLN Place 1 drop into both eyes daily as needed (dry eyes).     Pramoxine HCl (CERAVE ITCH RELIEF) 1 % CREA Apply 1 Application topically daily as needed (itching).     Probiotic Product (ALIGN PO) Take 1 capsule by mouth daily.     Pumpkin Seed-Soy Germ (AZO BLADDER CONTROL/GO-LESS PO) Take 1 capsule by mouth in the morning, at noon, and at bedtime.     sodium chloride 1 g tablet Take 1 g by mouth 2 (two) times daily with a meal.     tacrolimus (PROTOPIC) 0.1 % ointment Apply topically 2 (two) times daily.     triamcinolone cream (KENALOG) 0.1 % Apply 1 Application topically as needed (itching).     zolpidem (AMBIEN) 10 MG tablet TAKE ONE TABLET BY MOUTH AT BEDTIME AS NEEDED FOR SLEEP 30 tablet 1   No current facility-administered  medications for this visit.     Past Medical History:  Diagnosis Date   Allergy    Anemia    Anxiety    Arthritis    degenerative disc disorder   Cancer (HCC)    skin cancer   Cardiac arrhythmia    Cataract    surgery in right eye in 03/2022   Diverticulitis    Diverticulosis    GERD (gastroesophageal reflux disease)    Heart murmur    HTN (hypertension)    not on meds in 2 years per pt on 05/19/22 n   Infertility, female    Neuromuscular disorder (HCC)    idiopathic peripheral  neuropathy   Peripheral neuropathy    Positive PPD    SIADH (syndrome of inappropriate ADH production) (HCC)     ROS:    All systems reviewed and negative except as noted in the HPI.   Past Surgical History:  Procedure Laterality Date   BREAST BIOPSY Left    BREAST EXCISIONAL BIOPSY Left    benign   CATARACT EXTRACTION Right 2023   CERVICAL BIOPSY  W/ LOOP ELECTRODE EXCISION     CERVICAL POLYPECTOMY  02/2016   CHOLECYSTECTOMY N/A 06/02/2022   Procedure: LAPAROSCOPIC CHOLECYSTECTOMY WITH INTRAOPERATIVE CHOLANGIOGRAM;  Surgeon: Darnell Level, MD;  Location: [facility];  Service: General;  Laterality: N/A;   COLONOSCOPY     COLPOSCOPY     DILATION AND CURETTAGE OF UTERUS     HYSTEROSCOPY     LOOP RECORDER IMPLANT     LUMBAR DISC SURGERY     LUMBAR LAMINECTOMY     TONSILLECTOMY     UPPER GASTROINTESTINAL ENDOSCOPY       Family History  Problem Relation Age of Onset   Breast cancer Mother    Glaucoma Mother    Miscarriages / Stillbirths Mother    Heart attack Father    Depression Father    Early death Father    Heart disease Father    Tuberculosis Paternal Grandmother    Prostate cancer Paternal Grandfather    Depression Brother    Hypertension Brother    Colon cancer Neg Hx    Esophageal cancer Neg Hx    Rectal cancer Neg Hx    Stomach cancer Neg Hx      Social History   Socioeconomic History   Marital status: Married    Spouse name: John   Number of children: 0   Years of education: [employer] education level: Not on file  Occupational History   Occupation: retired physician  Tobacco Use   Smoking status: Never   Smokeless tobacco: Never  Vaping Use   Vaping status: Never Used  Substance and Sexual Activity   Alcohol use: Yes    Alcohol/week: 7.0 standard drinks of alcohol    Types: 7 Glasses of wine per week    Comment: 102 glsses wine daily   Drug use: No   Sexual activity: Not Currently    Birth control/protection: Post-menopausal  Other Topics Concern   Not on file  Social History Narrative   Lives at home with her husband.   Right-handed.   1-2 cups caffeine  per day.   Retired Marine scientist   Social Determinants of Health   Financial Resource Strain: Low Risk  (03/11/2022)   Overall Financial Resource Strain (CARDIA)    Difficulty of Paying Living Expenses: Not hard at all  Food Insecurity: No Food Insecurity (03/11/2022)   Hunger Vital Sign  Worried About Programme researcher, broadcasting/film/video in the Last Year: Never true    Ran Out of Food in the Last Year: Never true  Transportation Needs: No Transportation Needs (03/11/2022)   PRAPARE - Administrator, Civil Service (Medical): No    Lack of Transportation (Non-Medical): No  Physical Activity: Inactive (03/11/2022)   Exercise Vital Sign    Days of Exercise per Week: 0 days    Minutes of Exercise per Session: 0 min  Stress: Stress Concern Present (03/11/2022)   [employer] of Occupational Health - Occupational Stress Questionnaire    Feeling of Stress : To some extent  Social Connections: Moderately Isolated (03/11/2022)   Social Connection and Isolation Panel [NHANES]    Frequency of Communication with Friends and Family: Three times a week    Frequency of Social Gatherings with Friends and Family: Never    Attends Religious Services: Never    Database administrator or Organizations: No    Attends Banker Meetings: Never    Marital Status: Married  Catering manager Violence: Not At Risk (03/11/2022)   Humiliation, Afraid, Rape, and Kick questionnaire    Fear of Current or Ex-Partner: No    Emotionally Abused: No    Physically Abused: No    Sexually Abused: No     BP 126/68   Pulse 84   Ht 5\' 5"  (1.651 m)   LMP  (LMP Unknown)   SpO2 99%   BMI 28.96 kg/m   Physical Exam:  Well appearing NAD HEENT: Unremarkable Neck:  No JVD, no thyromegally Lymphatics:  No adenopathy Back:  No CVA tenderness Lungs:  Clear with no wheezes HEART:  Regular rate rhythm, no murmurs, no rubs, no clicks Abd:  soft, positive bowel sounds, no organomegally, no rebound, no  guarding Ext:  2 plus pulses, no edema, no cyanosis, no clubbing Skin:  No rashes no nodules Neuro:  CN II through XII intact, motor grossly intact  DEVICE  Normal device function.  See PaceArt for details.   Assess/Plan: PAF - her symptoms are well controlled. She will continue her current meds. Coags - we discussed switching to generic pradaxa but for now she would like to continue the eliquis. HTN - her bp is well controlled on diet therapy.   Sharlot Gowda Nary Sneed,MD

## 2023-07-30 DIAGNOSIS — R7303 Prediabetes: Secondary | ICD-10-CM | POA: Insufficient documentation

## 2023-07-30 MED ORDER — DIAZEPAM 10 MG PO TABS: ORAL_TABLET | ORAL | 5 refills | Status: DC

## 2023-07-30 NOTE — Assessment & Plan Note (Signed)
Chronic.  Rate controlled.  On Eliquis 5 mg twice daily per cardiology.  No active bleeding

## 2023-07-30 NOTE — Assessment & Plan Note (Signed)
Chronic.  Worsening.  UTI has resolved.  She has started estrogen cream as she had problems with the inserts.  Will refer to urogynecology.

## 2023-07-30 NOTE — Assessment & Plan Note (Signed)
Chronic.  Follow BMP monthly.  Currently asymptomatic

## 2023-07-30 NOTE — Assessment & Plan Note (Signed)
Chronic.  Controlled on 600 mg gabapentin 3 times daily

## 2023-07-30 NOTE — Assessment & Plan Note (Signed)
Chronic.  Not well-controlled.  She has failed multiple SSRIs, BuSpar.  Currently taking Valium 10 mg up to 3 times daily.  Discussed my concerns with these higher doses and her age and frequency.  She firmly discussed her opinion that this is the type of dose that she needs and she is fine.  PDMP was checked.  Follow-up 6 months

## 2023-07-30 NOTE — Assessment & Plan Note (Signed)
-  Chronic.  Fair control.  Mostly takes Valium for sleep, however "occasionally" uses Ambien 10 mg.  Again I have voiced my concerns with her combos of medications.

## 2023-07-30 NOTE — Assessment & Plan Note (Signed)
Chronic.  Patient was not aware of diagnosis.  She saw that the A1c was "normal range".  Educated parameters.  She currently has a healthy diet.  Aware of risk of diabetes.  Continue working on diet/exercise.  Will check A1c next month with her regular blood draw

## 2023-07-30 NOTE — Assessment & Plan Note (Signed)
Chronic.  Controlled on gabapentin 600 mg 3 times daily

## 2023-07-30 NOTE — Assessment & Plan Note (Signed)
Idiopathic.  Chronic.  Fairly controlled on hydroxyzine 50 mg 4 times daily.  Gabapentin does not seem to controlled this part of her.  She does see dermatology at [facility].  Per patient, no excessive sedation/confusion from meds, and "usually needs higher doses of meds for affect"

## 2023-07-31 ENCOUNTER — Ambulatory Visit (INDEPENDENT_AMBULATORY_CARE_PROVIDER_SITE_OTHER): Payer: Medicare Other | Admitting: Family Medicine

## 2023-07-31 ENCOUNTER — Encounter: Payer: Self-pay | Admitting: Family Medicine

## 2023-07-31 VITALS — Temp 97.7°F | Ht 65.0 in

## 2023-07-31 DIAGNOSIS — R300 Dysuria: Secondary | ICD-10-CM

## 2023-07-31 DIAGNOSIS — N3090 Cystitis, unspecified without hematuria: Secondary | ICD-10-CM | POA: Diagnosis not present

## 2023-07-31 LAB — POCT URINALYSIS DIPSTICK
Bilirubin, UA: NEGATIVE
Glucose, UA: NEGATIVE
Ketones, UA: NEGATIVE
Nitrite, UA: NEGATIVE
Protein, UA: POSITIVE — AB
Spec Grav, UA: 1.025 (ref 1.010–1.025)
Urobilinogen, UA: 0.2 U/dL
pH, UA: 6 (ref 5.0–8.0)

## 2023-07-31 MED ORDER — SULFAMETHOXAZOLE-TRIMETHOPRIM 800-160 MG PO TABS: 1.0000 | ORAL_TABLET | Freq: Two times a day (BID) | ORAL | 0 refills | Status: DC

## 2023-07-31 NOTE — Patient Instructions (Signed)
VISIT SUMMARY:  During today's visit, we discussed your ongoing urinary symptoms, including burning and increased frequency, which have been troubling you for almost two years. We reviewed your recent treatments and specialist visits, and we have developed a plan to address your current symptoms and prevent future issues.  YOUR PLAN:  -CHRONIC DYSURIA AND URINARY FREQUENCY: Chronic dysuria means long-term pain or discomfort when urinating, and urinary frequency means needing to urinate more often than usual. You have been experiencing these symptoms for almost two years, with recent worsening. We will start you on Bactrim (sulfamethoxazole/trimethoprim) twice a day for one week to treat any possible infection. We will also order a urine culture to check for bacteria and recommend a follow-up test in 10-12 days to ensure the infection is cleared. Additionally, Dr. Ruthine Dose  will refer you to a urogynecologist for further evaluation and advise you to increase your water intake.  INSTRUCTIONS:  Please schedule a lab visit or an appointment with Dr. Ruthine Dose in 10-12 days for a follow-up urine test to ensure the infection is cleared. We will contact you with the results of your urine culture next week.

## 2023-07-31 NOTE — Progress Notes (Signed)
Subjective  CC: dysuria  Same day acute visit; PCP not available. Chart reviewed.  HPI: Katrina Benitez is a 77 y.o. female who presents to the office today to address the problems listed above in the chief complaint. Discussed the use of AI scribe software for clinical note transcription with the patient, who gave verbal consent to proceed.  History of Present Illness   The patient, with a history of chronic urinary symptoms, presents with a worsening of her condition. She reports a longstanding issue of urinary burning and frequency, which has been ongoing for approximately two years. Despite intermittent treatment, the burning sensation has never fully resolved. The patient has sought specialist care in the past, but found it unhelpful.  Recently, the patient was treated for a urinary tract infection (UTI) and experienced temporary relief. e.coli + treated wiht keflex. However, the symptoms have returned and are now described as "terrible burning" and increased urinary frequency, with the patient waking up three times a night to urinate, a significant change from her usual pattern.  The patient denies any flank pain or fever. She also reports a recent visit to a urologist, approximately two to three weeks ago, during which her urine was reportedly normal. The patient has been on AZO for symptom relief, but reports it as ineffective. She also has a history of a possible allergic reaction to either Flagyl or Cipro, but the specific allergen is unclear.  The patient's symptoms have been persistent and bothersome, leading to a desire for further specialist intervention. She expresses interest in seeing a new urogynecologist for further evaluation and management of her chronic urinary symptoms.      Results LABS Urine culture: Escherichia coli sensitive to all antibiotics (07/14/2023) Assessment  1. Recurrent cystitis   2. Dysuria      Plan  Assessment and Plan    Chronic Dysuria and  Urinary Frequency: now with recurrent UTI by sxs and urine dipstick: Chronic dysuria and urinary frequency for almost two years, recently exacerbated with severe burning and increased frequency, nocturia x3. Previous treatments include Keflex and Macrobid. Recent urine culture showed E. coli sensitive to all antibiotics. Intermittent symptoms without confirmed infection. No fever, flank pain, or bladder tenderness. Hematuria noted. Previous specialist visit unhelpful. Discussed risks of prolonged antibiotic use, including side effects and antibiotic resistance. Benefits of Bactrim (sulfamethoxazole/trimethoprim) and need for follow-up test of cure discussed. Patient prefers not to use Pyridium due to cost and lack of efficacy. - Prescribe Bactrim (sulfamethoxazole/trimethoprim) BID for one week - Order urine culture - Recommend follow-up test of cure in 10-12 days - Refer to urogynecologist for further evaluation - Advise increased hydration  Follow-up - Schedule lab visit or appointment with Dr. Ruthine Dose in 10-12 days for test of cure urine - Contact patient with culture results next week.     Visit date not found  Orders Placed This Encounter  Procedures   Urine Culture   POCT Urinalysis Dipstick   Meds ordered this encounter  Medications   sulfamethoxazole-trimethoprim (BACTRIM DS) 800-160 MG tablet    Sig: Take 1 tablet by mouth 2 (two) times daily.    Dispense:  14 tablet    Refill:  0      I reviewed the patients updated PMH, FH, and SocHx.    Patient Active Problem List   Diagnosis Date Noted   Prediabetes 07/30/2023   High risk medication use 01/08/2023   Abdominal pain, right upper quadrant 05/18/2022   Symptomatic cholelithiasis 05/18/2022  Syncope and collapse 03/05/2022   Paroxysmal atrial fibrillation (HCC) 01/27/2022   Increased risk of breast cancer 04/18/2021   Anemia 01/09/2021   Pruritus 11/14/2019   Depression, recurrent (HCC) 02/18/2019   Primary insomnia  02/18/2019   Atrial fibrillation with RVR (HCC) 01/19/2019   Hyperlipidemia 09/30/2018   Post herpetic neuralgia 08/12/2018   Idiopathic peripheral neuropathy 08/12/2018   Vitamin D deficiency 07/22/2018   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    Allergies: Patient is allergic to flagyl [metronidazole], hydromorphone, iodixanol, fentanyl, cipro [ciprofloxacin hcl], and dilaudid [hydromorphone hcl]. Family History: Patient family history includes Breast cancer in her mother; Depression in her brother and father; Early death in her father; Glaucoma in her mother; Heart attack in her father; Heart disease in her father; Hypertension in her brother; Miscarriages / [place] in her mother; Prostate cancer in her paternal grandfather; Tuberculosis in her paternal grandmother. Social History:  Patient  reports that she has never smoked. She has never used smokeless tobacco. She reports current alcohol use of about 7.0 standard drinks of alcohol per week. She reports that she does not use drugs.  Review of Systems: Constitutional: Negative for fever malaise or anorexia Cardiovascular: negative for chest pain Respiratory: negative for SOB or persistent cough Gastrointestinal: negative for abdominal pain  Objective  Vitals: Temp 97.7 F (36.5 C)   Ht 5\' 5"  (1.651 m)   LMP  (LMP Unknown)   BMI 28.96 kg/m  General: no acute distress , A&Ox3 HEENT: PEERL, conjunctiva normal, neck is supple Cardiovascular:  RRR without murmur or gallop.  Respiratory:  Good breath sounds bilaterally,  CTAB with normal respiratory effort Skin:  Warm, no rashes  Commons side effects, risks, benefits, and alternatives for medications and treatment plan prescribed today were discussed, and the patient expressed understanding of the given instructions. Patient is instructed to call or message via MyChart if he/she has any questions or concerns regarding our treatment plan. No barriers to understanding were identified. We discussed Red Flag symptoms and signs in detail. Patient expressed understanding regarding what to do in case of urgent or emergency type symptoms.  Medication list was reconciled, printed and provided to the patient in AVS. Patient instructions and summary information was reviewed with the patient as documented in the AVS. This note was prepared with assistance of Dragon voice recognition software. Occasional wrong-word or sound-a-like substitutions may have occurred due to the inherent limitations of voice recognition software

## 2023-08-02 LAB — URINE CULTURE
MICRO NUMBER:: 15706004
SPECIMEN QUALITY:: ADEQUATE

## 2023-08-03 ENCOUNTER — Other Ambulatory Visit: Payer: Self-pay | Admitting: Internal Medicine

## 2023-08-04 ENCOUNTER — Other Ambulatory Visit: Payer: Self-pay | Admitting: Cardiovascular Disease

## 2023-08-04 ENCOUNTER — Encounter: Payer: Self-pay | Admitting: Family Medicine

## 2023-08-04 DIAGNOSIS — I480 Paroxysmal atrial fibrillation: Secondary | ICD-10-CM

## 2023-08-04 MED ORDER — NITROFURANTOIN MONOHYD MACRO 100 MG PO CAPS: 100.0000 mg | ORAL_CAPSULE | Freq: Two times a day (BID) | ORAL | 0 refills | Status: DC

## 2023-08-04 NOTE — Progress Notes (Signed)
Please call patient:see below. Your urine is again infected but is resistant to Septra. I have ordered macrobid to take to clear the infection.  Please stop the bactrim and start the new antibiotic.  We will test the urine again after completion.  Let me know if you have any questions. Dr. Mardelle Matte

## 2023-08-04 NOTE — Telephone Encounter (Signed)
Prescription refill request for Eliquis received. Indication:afib Last office visit:11/24 Scr:0.94 Age: 77 Weight:78.9  kg  Prescription refilled

## 2023-08-04 NOTE — Addendum Note (Signed)
Addended by: Asencion Partridge on: 08/04/2023 11:43 AM   Modules accepted: Orders

## 2023-08-10 ENCOUNTER — Other Ambulatory Visit: Payer: Medicare Other

## 2023-08-10 ENCOUNTER — Ambulatory Visit (INDEPENDENT_AMBULATORY_CARE_PROVIDER_SITE_OTHER): Payer: Medicare Other

## 2023-08-10 DIAGNOSIS — I480 Paroxysmal atrial fibrillation: Secondary | ICD-10-CM

## 2023-08-10 LAB — CUP PACEART REMOTE DEVICE CHECK
Date Time Interrogation Session: 20241117232759
Implantable Pulse Generator Implant Date: 20230614

## 2023-08-11 DIAGNOSIS — Z23 Encounter for immunization: Secondary | ICD-10-CM | POA: Diagnosis not present

## 2023-08-21 ENCOUNTER — Telehealth: Payer: Medicare Other | Admitting: Family Medicine

## 2023-08-21 DIAGNOSIS — N390 Urinary tract infection, site not specified: Secondary | ICD-10-CM

## 2023-08-21 DIAGNOSIS — R319 Hematuria, unspecified: Secondary | ICD-10-CM | POA: Diagnosis not present

## 2023-08-21 MED ORDER — NITROFURANTOIN MONOHYD MACRO 100 MG PO CAPS: 100.0000 mg | ORAL_CAPSULE | Freq: Two times a day (BID) | ORAL | 0 refills | Status: AC

## 2023-08-21 NOTE — Progress Notes (Signed)
Virtual Visit Consent   Katrina Benitez, you are scheduled for a virtual visit with a [facility] provider today. Just as with appointments in the office, your consent must be obtained to participate. Your consent will be active for this visit and any virtual visit you may have with one of our providers in the next 365 days. If you have a MyChart account, a copy of this consent can be sent to you electronically.  As this is a virtual visit, video technology does not allow for your provider to perform a traditional examination. This may limit your provider's ability to fully assess your condition. If your provider identifies any concerns that need to be evaluated in person or the need to arrange testing (such as labs, EKG, etc.), we will make arrangements to do so. Although advances in technology are sophisticated, we cannot ensure that it will always work on either your end or our end. If the connection with a video visit is poor, the visit may have to be switched to a telephone visit. With either a video or telephone visit, we are not always able to ensure that we have a secure connection.  By engaging in this virtual visit, you consent to the provision of healthcare and authorize for your insurance to be billed (if applicable) for the services provided during this visit. Depending on your insurance coverage, you may receive a charge related to this service.  I need to obtain your verbal consent now. Are you willing to proceed with your visit today? Katrina Benitez has provided verbal consent on 08/21/2023 for a virtual visit (video or telephone). Katrina Benitez, [place]  Date: 08/21/2023 2:48 PM  Virtual Visit via Video Note   I, Katrina Benitez, connected with  Katrina Benitez  ([id number], [birth year]) on 08/21/23 at  2:45 PM EST by a video-enabled telemedicine application and verified that I am speaking with the correct person using two identifiers.  Location: Patient: Virtual Visit Location Patient:  Home Provider: Virtual Visit Location Provider: Home Office   I discussed the limitations of evaluation and management by telemedicine and the availability of in person appointments. The patient expressed understanding and agreed to proceed.    History of Present Illness: Katrina Benitez is a 77 y.o. who identifies as a female who was assigned female at birth, and is being seen today for c/o Pt states she has had UTIs off and on for two years. Pt states has been treated three times in the last five weeks. Pt states UTI is back with a vengeance.  Pt states she has burning with urination and blood in the urine and on the paper when she wipes. Pt states has last taken antibiotics about two weeks ago. Pt denies vaginal discharge or vaginal irritation.   HPI: HPI  Problems:  Patient Active Problem List   Diagnosis Date Noted   Prediabetes 07/30/2023   High risk medication use 01/08/2023   Abdominal pain, right upper quadrant 05/18/2022   Symptomatic cholelithiasis 05/18/2022   Syncope and collapse 03/05/2022   Paroxysmal atrial fibrillation (HCC) 01/27/2022   Increased risk of breast cancer 04/18/2021   Anemia 01/09/2021   Pruritus 11/14/2019   Depression, recurrent (HCC) 02/18/2019   Primary insomnia 02/18/2019   Atrial fibrillation with RVR (HCC) 01/19/2019   Hyperlipidemia 09/30/2018   Post herpetic neuralgia 08/12/2018   Idiopathic peripheral neuropathy 08/12/2018   Vitamin D deficiency 07/22/2018   Vulvar pruritus 04/16/2016   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   Opacity noted on  imaging study    Chest pain 01/21/2016   SIADH (syndrome of inappropriate ADH production) (HCC) 01/21/2016   Hypertension 01/21/2016   Anxiety 01/21/2016   GERD (gastroesophageal reflux disease) 01/21/2016   Gait abnormality 12/12/2015    Allergies:  Allergies  Allergen Reactions   Flagyl [Metronidazole] Hives   Hydromorphone Hives   Iodixanol Other (See Comments) and Swelling    Throat swelling  Other  Reaction(s): Other (See Comments)  Throat swelling, Throat swelling, Throat swelling, Other reaction(s): Other, Other (See Comments), Throat swelling, Throat swelling, Throat swelling, Throat swelling, Throat swelling   Fentanyl Itching   Cipro [Ciprofloxacin Hcl] Hives   Dilaudid [Hydromorphone Hcl] Hives   Medications:  Current Outpatient Medications:    acetaminophen (TYLENOL) 500 MG tablet, Take 1,000 mg by mouth every 6 (six) hours as needed for mild pain or moderate pain., Disp: , Rfl:    Alum Hydroxide-Mag Carbonate (GAVISCON EXTRA STRENGTH) 160-105 MG CHEW, Chew 2 tablets by mouth daily as needed (upset stomach)., Disp: , Rfl:    betamethasone valerate (VALISONE) 0.1 % cream, APPLY TWICE DAILY, Disp: 45 g, Rfl: 0   betamethasone valerate ointment (VALISONE) 0.1 %, Apply 1 Application topically 2 (two) times daily as needed (itching)., Disp: , Rfl:    Cholecalciferol 50 MCG (2000 UT) CAPS, Take 4,000 Units by mouth daily., Disp: , Rfl:    diazepam (VALIUM) 10 MG tablet, TAKE ONE TABLET EVERY TWELVE HOURS AS NEEDED FOR ANXIETY, Disp: 60 tablet, Rfl: 5   diphenoxylate-atropine (LOMOTIL) 2.5-0.025 MG tablet, TAKE ONE (1) TABLET FOUR TIMES EACH DAY AS NEEDED FOR DIARRHEA OR LOOSE STOOLS, Disp: 120 tablet, Rfl: 1   ELIQUIS 5 MG TABS tablet, TAKE ONE TABLET BY MOUTH TWICE DAILY, Disp: 60 tablet, Rfl: 11   estradiol (ESTRACE) 0.1 MG/GM vaginal cream, 0.5 g intravaginally 1-3 times per week., Disp: 42.5 g, Rfl: 1   gabapentin (NEURONTIN) 600 MG tablet, TAKE ONE TABLET BY MOUTH THREE TIMES DAILY, Disp: 270 tablet, Rfl: 1   hydrOXYzine (VISTARIL) 25 MG capsule, Two capsules 4 times daily, Disp: 720 capsule, Rfl: 1   nitrofurantoin, macrocrystal-monohydrate, (MACROBID) 100 MG capsule, Take 1 capsule (100 mg total) by mouth 2 (two) times daily for 10 days., Disp: 20 capsule, Rfl: 0   omeprazole (PRILOSEC) 40 MG capsule, TAKE ONE CAPSULE EACH DAY, Disp: 90 capsule, Rfl: 1   ondansetron (ZOFRAN-ODT) 8  MG disintegrating tablet, TAKE ONE TABLET EVERY EIGHT HOURS AS NEEDED FOR NAUSEA AND VOMITING, Disp: 20 tablet, Rfl: 1   phenazopyridine (PYRIDIUM) 100 MG tablet, Take 1 tablet (100 mg total) by mouth 3 (three) times daily as needed for pain., Disp: 8 tablet, Rfl: 0   Polyethyl Glyc-Propyl Glyc PF (SYSTANE HYDRATION PF) 0.4-0.3 % SOLN, Place 1 drop into both eyes daily as needed (dry eyes)., Disp: , Rfl:    Pramoxine HCl (CERAVE ITCH RELIEF) 1 % CREA, Apply 1 Application topically daily as needed (itching)., Disp: , Rfl:    Probiotic Product (ALIGN PO), Take 1 capsule by mouth daily., Disp: , Rfl:    Pumpkin Seed-Soy Germ (AZO BLADDER CONTROL/GO-LESS PO), Take 1 capsule by mouth in the morning, at noon, and at bedtime., Disp: , Rfl:    sodium chloride 1 g tablet, Take 1 g by mouth 2 (two) times daily with a meal., Disp: , Rfl:    tacrolimus (PROTOPIC) 0.1 % ointment, Apply topically 2 (two) times daily., Disp: , Rfl:    triamcinolone cream (KENALOG) 0.1 %, Apply 1 Application topically as  needed (itching)., Disp: , Rfl:    zolpidem (AMBIEN) 10 MG tablet, TAKE ONE TABLET BY MOUTH AT BEDTIME AS NEEDED FOR SLEEP, Disp: 30 tablet, Rfl: 1  Observations/Objective: Patient is well-developed, well-nourished in no acute distress.  Resting comfortably at home.  Head is normocephalic, atraumatic.  No labored breathing.  Speech is clear and coherent with logical content.  Patient is alert and oriented at baseline.    Assessment and Plan: 1. Urinary tract infection with hematuria, site unspecified - nitrofurantoin, macrocrystal-monohydrate, (MACROBID) 100 MG capsule; Take 1 capsule (100 mg total) by mouth 2 (two) times daily for 10 days.  Dispense: 20 capsule; Refill: 0  -Advised Pt to follow up with PCP on Monday to obtain urine cultures since she is having recurrent UTIs.   Follow Up Instructions: I discussed the assessment and treatment plan with the patient. The patient was provided an opportunity  to ask questions and all were answered. The patient agreed with the plan and demonstrated an understanding of the instructions.  A copy of instructions were sent to the patient via MyChart unless otherwise noted below.    The patient was advised to call back or seek an in-person evaluation if the symptoms worsen or if the condition fails to improve as anticipated.    Katrina Pandy, PA-C

## 2023-08-21 NOTE — Patient Instructions (Signed)
Katrina Benitez, thank you for joining [name], PA-C for today's virtual visit.  While this provider is not your primary care provider (PCP), if your PCP is located in our provider database this encounter information will be shared with them immediately following your visit.   A [facility] [place] MyChart account gives you access to today's visit and all your visits, tests, and labs performed at [facility] " click here if you don't have a [facility] [place] MyChart account or go to [email or web address]  Consent: (Patient) Katrina Benitez provided verbal consent for this virtual visit at the beginning of the encounter.  Current Medications:  Current Outpatient Medications:    acetaminophen (TYLENOL) 500 MG tablet, Take 1,000 mg by mouth every 6 (six) hours as needed for mild pain or moderate pain., Disp: , Rfl:    Alum Hydroxide-Mag Carbonate (GAVISCON EXTRA STRENGTH) 160-105 MG CHEW, Chew 2 tablets by mouth daily as needed (upset stomach)., Disp: , Rfl:    betamethasone valerate (VALISONE) 0.1 % cream, APPLY TWICE DAILY, Disp: 45 g, Rfl: 0   betamethasone valerate ointment (VALISONE) 0.1 %, Apply 1 Application topically 2 (two) times daily as needed (itching)., Disp: , Rfl:    Cholecalciferol 50 MCG (2000 UT) CAPS, Take 4,000 Units by mouth daily., Disp: , Rfl:    diazepam (VALIUM) 10 MG tablet, TAKE ONE TABLET EVERY TWELVE HOURS AS NEEDED FOR ANXIETY, Disp: 60 tablet, Rfl: 5   diphenoxylate-atropine (LOMOTIL) 2.5-0.025 MG tablet, TAKE ONE (1) TABLET FOUR TIMES EACH DAY AS NEEDED FOR DIARRHEA OR LOOSE STOOLS, Disp: 120 tablet, Rfl: 1   ELIQUIS 5 MG TABS tablet, TAKE ONE TABLET BY MOUTH TWICE DAILY, Disp: 60 tablet, Rfl: 11   estradiol (ESTRACE) 0.1 MG/GM vaginal cream, 0.5 g intravaginally 1-3 times per week., Disp: 42.5 g, Rfl: 1   gabapentin (NEURONTIN) 600 MG tablet, TAKE ONE TABLET BY MOUTH THREE TIMES DAILY, Disp: 270 tablet, Rfl: 1   hydrOXYzine (VISTARIL) 25 MG capsule, Two  capsules 4 times daily, Disp: 720 capsule, Rfl: 1   nitrofurantoin, macrocrystal-monohydrate, (MACROBID) 100 MG capsule, Take 1 capsule (100 mg total) by mouth 2 (two) times daily., Disp: 14 capsule, Rfl: 0   omeprazole (PRILOSEC) 40 MG capsule, TAKE ONE CAPSULE EACH DAY, Disp: 90 capsule, Rfl: 1   ondansetron (ZOFRAN-ODT) 8 MG disintegrating tablet, TAKE ONE TABLET EVERY EIGHT HOURS AS NEEDED FOR NAUSEA AND VOMITING, Disp: 20 tablet, Rfl: 1   phenazopyridine (PYRIDIUM) 100 MG tablet, Take 1 tablet (100 mg total) by mouth 3 (three) times daily as needed for pain., Disp: 8 tablet, Rfl: 0   Polyethyl Glyc-Propyl Glyc PF (SYSTANE HYDRATION PF) 0.4-0.3 % SOLN, Place 1 drop into both eyes daily as needed (dry eyes)., Disp: , Rfl:    Pramoxine HCl (CERAVE ITCH RELIEF) 1 % CREA, Apply 1 Application topically daily as needed (itching)., Disp: , Rfl:    Probiotic Product (ALIGN PO), Take 1 capsule by mouth daily., Disp: , Rfl:    Pumpkin Seed-Soy Germ (AZO BLADDER CONTROL/GO-LESS PO), Take 1 capsule by mouth in the morning, at noon, and at bedtime., Disp: , Rfl:    sodium chloride 1 g tablet, Take 1 g by mouth 2 (two) times daily with a meal., Disp: , Rfl:    tacrolimus (PROTOPIC) 0.1 % ointment, Apply topically 2 (two) times daily., Disp: , Rfl:    triamcinolone cream (KENALOG) 0.1 %, Apply 1 Application topically as needed (itching)., Disp: , Rfl:    zolpidem (AMBIEN) 10 MG tablet,  TAKE ONE TABLET BY MOUTH AT BEDTIME AS NEEDED FOR SLEEP, Disp: 30 tablet, Rfl: 1   Medications ordered in this encounter:  No orders of the defined types were placed in this encounter.    *If you need refills on other medications prior to your next appointment, please contact your pharmacy*  Follow-Up: Call back or seek an in-person evaluation if the symptoms worsen or if the condition fails to improve as anticipated.  [facility] [phone]  Other Instructions Urinary Tract Infection, Adult A urinary  tract infection (UTI) is an infection of any part of the urinary tract. The urinary tract includes: The kidneys. The ureters. The bladder. The urethra. These organs make, store, and get rid of pee (urine) in the body. What are the causes? This infection is caused by germs (bacteria) in your genital area. These germs grow and cause swelling (inflammation) of your urinary tract. What increases the risk? The following factors may make you more likely to develop this condition: Using a small, thin tube (catheter) to drain pee. Not being able to control when you pee or poop (incontinence). Being female. If you are female, these things can increase the risk: Using these methods to prevent pregnancy: A medicine that kills sperm (spermicide). A device that blocks sperm (diaphragm). Having low levels of a female hormone (estrogen). Being pregnant. You are more likely to develop this condition if: You have genes that add to your risk. You are sexually active. You take antibiotic medicines. You have trouble peeing because of: A prostate that is bigger than normal, if you are female. A blockage in the part of your body that drains pee from the bladder. A kidney stone. A nerve condition that affects your bladder. Not getting enough to drink. Not peeing often enough. You have other conditions, such as: Diabetes. A weak disease-fighting system (immune system). Sickle cell disease. Gout. Injury of the spine. What are the signs or symptoms? Symptoms of this condition include: Needing to pee right away. Peeing small amounts often. Pain or burning when peeing. Blood in the pee. Pee that smells bad or not like normal. Trouble peeing. Pee that is cloudy. Fluid coming from the vagina, if you are female. Pain in the belly or lower back. Other symptoms include: Vomiting. Not feeling hungry. Feeling mixed up (confused). This may be the first symptom in older adults. Being tired and grouchy  (irritable). A fever. Watery poop (diarrhea). How is this treated? Taking antibiotic medicine. Taking other medicines. Drinking enough water. In some cases, you may need to see a specialist. Follow these instructions at home:  Medicines Take over-the-counter and prescription medicines only as told by your doctor. If you were prescribed an antibiotic medicine, take it as told by your doctor. Do not stop taking it even if you start to feel better. General instructions Make sure you: Pee until your bladder is empty. Do not hold pee for a long time. Empty your bladder after sex. Wipe from front to back after peeing or pooping if you are a female. Use each tissue one time when you wipe. Drink enough fluid to keep your pee pale yellow. Keep all follow-up visits. Contact a doctor if: You do not get better after 1-2 days. Your symptoms go away and then come back. Get help right away if: You have very bad back pain. You have very bad pain in your lower belly. You have a fever. You have chills. You feeling like you will vomit or you  vomit. Summary A urinary tract infection (UTI) is an infection of any part of the urinary tract. This condition is caused by germs in your genital area. There are many risk factors for a UTI. Treatment includes antibiotic medicines. Drink enough fluid to keep your pee pale yellow. This information is not intended to replace advice given to you by your health care provider. Make sure you discuss any questions you have with your health care provider. Document Revised: 04/15/2020 Document Reviewed: 04/20/2020 Elsevier Patient Education  2024 Elsevier Inc.    If you have been instructed to have an in-person evaluation today at a local Urgent Care facility, please use the link below. It will take you to a list of all of our available [facility], including address, phone number and hours of operation. Please do not delay care.  [facility]  If you or a family member do not have a primary care provider, use the link below to schedule a visit and establish care. When you choose a [facility] primary care physician or advanced practice provider, you gain a long-term partner in health. Find a Primary Care Provider  Learn more about [facility] in-office and virtual care options: [facility] - Get Care Now

## 2023-08-26 ENCOUNTER — Other Ambulatory Visit: Payer: Self-pay

## 2023-08-26 ENCOUNTER — Other Ambulatory Visit: Payer: Medicare Other

## 2023-08-26 ENCOUNTER — Other Ambulatory Visit (INDEPENDENT_AMBULATORY_CARE_PROVIDER_SITE_OTHER): Payer: Medicare Other

## 2023-08-26 ENCOUNTER — Ambulatory Visit (INDEPENDENT_AMBULATORY_CARE_PROVIDER_SITE_OTHER): Payer: Medicare Other | Admitting: Family Medicine

## 2023-08-26 ENCOUNTER — Encounter: Payer: Self-pay | Admitting: Family Medicine

## 2023-08-26 VITALS — BP 122/68 | HR 65 | Temp 97.8°F | Ht 65.0 in

## 2023-08-26 DIAGNOSIS — N390 Urinary tract infection, site not specified: Secondary | ICD-10-CM | POA: Diagnosis not present

## 2023-08-26 DIAGNOSIS — E222 Syndrome of inappropriate secretion of antidiuretic hormone: Secondary | ICD-10-CM

## 2023-08-26 DIAGNOSIS — J0110 Acute frontal sinusitis, unspecified: Secondary | ICD-10-CM | POA: Diagnosis not present

## 2023-08-26 DIAGNOSIS — R300 Dysuria: Secondary | ICD-10-CM

## 2023-08-26 DIAGNOSIS — Z79899 Other long term (current) drug therapy: Secondary | ICD-10-CM

## 2023-08-26 LAB — BASIC METABOLIC PANEL
BUN: 21 mg/dL (ref 6–23)
CO2: 29 meq/L (ref 19–32)
Calcium: 9.2 mg/dL (ref 8.4–10.5)
Chloride: 100 meq/L (ref 96–112)
Creatinine, Ser: 0.9 mg/dL (ref 0.40–1.20)
GFR: 61.74 mL/min (ref >60.00)
Glucose, Bld: 97 mg/dL (ref 70–99)
Potassium: 5.1 meq/L (ref 3.5–5.1)
Sodium: 133 meq/L — ABNORMAL LOW (ref 135–145)

## 2023-08-26 MED ORDER — CEFTRIAXONE SODIUM 1 G IJ SOLR
1.0000 g | Freq: Once | INTRAMUSCULAR | Status: AC
  Administered 2023-08-26: 1 g via INTRAMUSCULAR

## 2023-08-26 MED ORDER — AMOXICILLIN-POT CLAVULANATE 875-125 MG PO TABS: 1.0000 | ORAL_TABLET | Freq: Two times a day (BID) | ORAL | 0 refills | Status: AC

## 2023-08-26 MED ORDER — METH-HYO-M BL-BENZ ACD-PH SAL 81.6 MG PO TABS: 81.6000 mg | ORAL_TABLET | Freq: Four times a day (QID) | ORAL | 0 refills | Status: DC | PRN

## 2023-08-26 NOTE — Patient Instructions (Addendum)
Prebiotic powder (Pure Encapsulations)  Dr. Lanetta Inch, will change referral to URGENT

## 2023-08-26 NOTE — Progress Notes (Signed)
Acute Office Visit  Subjective:     Patient ID: Katrina Benitez, female    DOB: Jan 07, 1946, 77 y.o.   MRN: [id number]  Chief Complaint  Patient presents with   Urinary Tract Infection    PT experiencing recurrent UTI for the past month. PT originally prescribed keflex, bactrim, and now macrobid. Most recent bout of UTI symptoms started this past weekend (incontinence, urgency, blood, burning when urinating)    HPI 77 year old female presents for evaluation of dysuria, urinary frequency, urgency, hematuria, incontinence episodes that have worsened over the last 3-4 days. States that bladder and UTI issues really started about 2 years ago.  Has medical history of chronic cystitis, SIADH, hypertension, A-fib, GERD, vulvodynia.  Was treated with Macrobid starting on 08/21/2023.   [facility]. She has not had her initial appointment just yet. Using align probiotics, no prebiotics. Using topical estrogen for the last month with little relief. Denies nausea, vomiting, diarrhea, rash, chills, fever, flank pain.  Has been referred to Urogyn but has not been called for appt just yet.  Separately reports right maxillary sinus pain and pressure with history of chronic sinusitis for the last week. Has not attempted to treat at home. Denies known sick contacts.    ROS Per HPI      Objective:    BP 122/68   Pulse 65   Temp 97.8 F (36.6 C) (Oral)   Ht 5\' 5"  (1.651 m)   LMP  (LMP Unknown)   SpO2 99%   BMI 28.96 kg/m    Physical Exam Vitals and nursing note reviewed.  Constitutional:      Appearance: Normal appearance. She is normal weight.     Comments: Appears fatigued  HENT:     Head: Normocephalic and atraumatic.     Right Ear: Tympanic membrane and ear canal normal.     Left Ear: Tympanic membrane and ear canal normal.     Nose:     Right Sinus: Frontal sinus tenderness present.  Eyes:     Extraocular Movements:  Extraocular movements intact.     Pupils: Pupils are equal, round, and reactive to light.  Cardiovascular:     Rate and Rhythm: Normal rate and regular rhythm.     Heart sounds: Normal heart sounds.  Pulmonary:     Effort: Pulmonary effort is normal.     Breath sounds: Normal breath sounds.  Abdominal:     General: Abdomen is flat.     Palpations: Abdomen is soft.     Tenderness: There is no abdominal tenderness (Suprapubic). There is no right CVA tenderness or left CVA tenderness.  Skin:    General: Skin is warm and dry.  Neurological:     Mental Status: She is alert.  Psychiatric:        Mood and Affect: Mood normal.        Thought Content: Thought content normal.     Results for orders placed or performed in visit on 08/26/23  Urine Culture   Specimen: Urine  Result Value Ref Range   Source: URINE    Status: FINAL    Result: No Growth   Basic metabolic panel  Result Value Ref Range   Sodium 133 (L) 135 - 145 mEq/L   Potassium 5.1 3.5 - 5.1 mEq/L   Chloride 100 96 - 112 mEq/L   CO2 29 19 - 32 mEq/L   Glucose, Bld 97 70 - 99 mg/dL  BUN 21 6 - 23 mg/dL   Creatinine, Ser 1.61 0.40 - 1.20 mg/dL   GFR 09.60 >45.40 mL/min   Calcium 9.2 8.4 - 10.5 mg/dL        Assessment & Plan:  1. SIADH (syndrome of inappropriate ADH production) (HCC)  - Comprehensive metabolic panel  2. Chronic UTI  - Urine Culture; Future - Urinalysis - Comprehensive metabolic panel - Ambulatory referral to Urogynecology  3. Dysuria  - Meth-Hyo-M Bl-Benz Acd-Ph Sal 81.6 MG TABS; Take 1 tablet (81.6 mg total) by mouth every 6 (six) hours as needed (as needed for bladder pain).  Dispense: 30 tablet; Refill: 0 - Ambulatory referral to Urogynecology  4. Acute non-recurrent frontal sinusitis  - amoxicillin-clavulanate (AUGMENTIN) 875-125 MG tablet; Take 1 tablet by mouth every 12 (twelve) hours for 10 days.  Dispense: 20 tablet; Refill: 0  URGENT referral placed for Urogynecology to address  severe pain   Meds ordered this encounter  Medications   Meth-Hyo-M Bl-Benz Acd-Ph Sal 81.6 MG TABS    Sig: Take 1 tablet (81.6 mg total) by mouth every 6 (six) hours as needed (as needed for bladder pain).    Dispense:  30 tablet    Refill:  0   amoxicillin-clavulanate (AUGMENTIN) 875-125 MG tablet    Sig: Take 1 tablet by mouth every 12 (twelve) hours for 10 days.    Dispense:  20 tablet    Refill:  0   cefTRIAXone (ROCEPHIN) injection 1 g    Return if symptoms worsen or fail to improve.  Moshe Cipro, FNP

## 2023-08-26 NOTE — Progress Notes (Signed)
stable °

## 2023-08-27 LAB — URINE CULTURE: Result:: NO GROWTH

## 2023-09-04 NOTE — Progress Notes (Signed)
Carelink Summary Report / Loop Recorder 

## 2023-09-14 ENCOUNTER — Ambulatory Visit (INDEPENDENT_AMBULATORY_CARE_PROVIDER_SITE_OTHER): Payer: Medicare Other

## 2023-09-14 DIAGNOSIS — I480 Paroxysmal atrial fibrillation: Secondary | ICD-10-CM | POA: Diagnosis not present

## 2023-09-15 LAB — CUP PACEART REMOTE DEVICE CHECK
Date Time Interrogation Session: 20241222231257
Implantable Pulse Generator Implant Date: 20230614

## 2023-09-17 ENCOUNTER — Encounter: Payer: Self-pay | Admitting: Internal Medicine

## 2023-09-17 ENCOUNTER — Other Ambulatory Visit (HOSPITAL_COMMUNITY): Payer: Self-pay

## 2023-10-01 ENCOUNTER — Other Ambulatory Visit: Payer: Self-pay | Admitting: Family Medicine

## 2023-10-01 NOTE — Telephone Encounter (Signed)
 Needs to get her appt sch for May

## 2023-10-02 ENCOUNTER — Other Ambulatory Visit: Payer: Self-pay | Admitting: Family Medicine

## 2023-10-02 NOTE — Telephone Encounter (Signed)
 LVM informing pt that f/u is needed.

## 2023-10-02 NOTE — Telephone Encounter (Signed)
 Copied from CRM [id number]. Topic: Clinical - Medication Refill >> Oct 02, 2023  9:03 AM Laymon HERO wrote: Reason for CRM: Patient received a call that she can not get her gabapentin  (NEURONTIN ) 600 MG tablet filled until she has an appt with Dr Wendolyn in May, she needs to take this medication everyday and needs to get it filled as she only has enough left for the weekend  Spoke to patient and informed her that pcp sent medication to the pharmacy on 10/01/23. Patient has scheduled follow-up.

## 2023-10-02 NOTE — Telephone Encounter (Signed)
 Copied from CRM [id number]. Topic: Clinical - Medication Refill >> Oct 02, 2023  9:00 AM Laymon HERO wrote: Most Recent Primary Care Visit:  Provider: [facility]  Department[facility]  Visit Type: LAB  Date: 08/26/2023  Medication: gabapentin  (NEURONTIN ) 600 MG tablet   Has the patient contacted their pharmacy? Yes (Agent: If no, request that the patient contact the pharmacy for the refill. If patient does not wish to contact the pharmacy document the reason why and proceed with request.) (Agent: If yes, when and what did the pharmacy advise?)  Is this the correct pharmacy for this prescription? Yes If no, delete pharmacy and type the correct one.  This is the patient's preferred pharmacy:  [facility] - [place], [place] - [address] [place] [address] Phone: [phone] Fax: [phone]    Has the prescription been filled recently? Yes  Is the patient out of the medication? Yes  Has the patient been seen for an appointment in the last year OR does the patient have an upcoming appointment? Yes  Can we respond through MyChart? Yes  Agent: Please be advised that Rx refills may take up to 3 business days. We ask that you follow-up with your pharmacy.

## 2023-10-19 ENCOUNTER — Ambulatory Visit (INDEPENDENT_AMBULATORY_CARE_PROVIDER_SITE_OTHER): Payer: Medicare Other

## 2023-10-19 DIAGNOSIS — I480 Paroxysmal atrial fibrillation: Secondary | ICD-10-CM

## 2023-10-19 LAB — CUP PACEART REMOTE DEVICE CHECK
Date Time Interrogation Session: 20250126231646
Implantable Pulse Generator Implant Date: 20230614

## 2023-10-26 NOTE — Progress Notes (Signed)
 Carelink Summary Report / Loop Recorder

## 2023-10-26 NOTE — Addendum Note (Signed)
Addended by: Geralyn Flash D on: 10/26/2023 12:26 PM   Modules accepted: Orders

## 2023-10-27 ENCOUNTER — Ambulatory Visit: Payer: Medicare Other | Admitting: Obstetrics and Gynecology

## 2023-11-04 ENCOUNTER — Ambulatory Visit: Payer: Medicare Other | Admitting: Obstetrics and Gynecology

## 2023-11-16 ENCOUNTER — Ambulatory Visit (INDEPENDENT_AMBULATORY_CARE_PROVIDER_SITE_OTHER): Payer: Medicare Other | Admitting: Podiatry

## 2023-11-16 ENCOUNTER — Encounter: Payer: Self-pay | Admitting: Podiatry

## 2023-11-16 VITALS — Ht 65.0 in | Wt 174.0 lb

## 2023-11-16 DIAGNOSIS — B351 Tinea unguium: Secondary | ICD-10-CM

## 2023-11-16 DIAGNOSIS — M79674 Pain in right toe(s): Secondary | ICD-10-CM

## 2023-11-16 DIAGNOSIS — L853 Xerosis cutis: Secondary | ICD-10-CM | POA: Diagnosis not present

## 2023-11-16 DIAGNOSIS — M79675 Pain in left toe(s): Secondary | ICD-10-CM | POA: Diagnosis not present

## 2023-11-16 NOTE — Progress Notes (Unsigned)
 Subjective:  Patient ID: Katrina Benitez, female    DOB: [birth year],  MRN: [id number]  Katrina Benitez presents to clinic today for:  Chief Complaint  Patient presents with   Nail Problem    " I think I may have ingrown nails on both big toes, I have a tendency to drop things on my feet and I am not sure if my shoes are causing my pain in my big toes and possibly ingrown nails, I would like a nail trim on the others as well" " I do have lymphedema and it is being monitored"   Patient notes nails are thick, discolored, elongated and painful in shoegear when trying to ambulate.  States that the great toenails feel ingrown along the borders.  Denies any drainage.  She also has some areas of hard skin on the plantar aspect of the forefoot bilateral.  PCP is Katrina Sow, MD.  Past Medical History:  Diagnosis Date   Allergy    Anemia    Anxiety    Arthritis    degenerative disc disorder   Cancer [facility])    skin cancer   Cardiac arrhythmia    Cataract    surgery in right eye in 03/2022   Diverticulitis    Diverticulosis    GERD (gastroesophageal reflux disease)    Heart murmur    HTN (hypertension)    not on meds in 2 years per pt on 05/19/22 n   Infertility, female    Neuromuscular disorder (HCC)    idiopathic peripheral  neuropathy   Peripheral neuropathy    Positive PPD    SIADH (syndrome of inappropriate ADH production) (HCC)     Past Surgical History:  Procedure Laterality Date   BREAST BIOPSY Left    BREAST EXCISIONAL BIOPSY Left    benign   CATARACT EXTRACTION Right 2023   CERVICAL BIOPSY  W/ LOOP ELECTRODE EXCISION     CERVICAL POLYPECTOMY  02/2016   CHOLECYSTECTOMY N/A 06/02/2022   Procedure: LAPAROSCOPIC CHOLECYSTECTOMY WITH INTRAOPERATIVE CHOLANGIOGRAM;  Surgeon: Darnell Level, MD;  Location: [facility];  Service: General;  Laterality: N/A;   COLONOSCOPY     COLPOSCOPY     DILATION AND CURETTAGE OF UTERUS     HYSTEROSCOPY     LOOP RECORDER IMPLANT     LUMBAR  DISC SURGERY     LUMBAR LAMINECTOMY     TONSILLECTOMY     UPPER GASTROINTESTINAL ENDOSCOPY      Allergies  Allergen Reactions   Flagyl [Metronidazole] Hives   Hydromorphone Hives   Iodixanol Other (See Comments) and Swelling    Throat swelling  Other Reaction(s): Other (See Comments)  Throat swelling, Throat swelling, Throat swelling, Other reaction(s): Other, Other (See Comments), Throat swelling, Throat swelling, Throat swelling, Throat swelling, Throat swelling   Fentanyl Itching   Cipro [Ciprofloxacin Hcl] Hives   Dilaudid [Hydromorphone Hcl] Hives    Review of Systems: Negative except as noted in the HPI.  Objective:  Katrina Benitez is a pleasant 78 y.o. female in NAD. AAO x 3.  Vascular Examination: Capillary refill time is 3-5 seconds to toes bilateral. Palpable pedal pulses b/l LE. Digital hair sparse b/l.  Skin temperature gradient WNL b/l.  Minimal edema to the lower legs.  Dermatological Examination: Pedal skin with normal turgor, texture and tone b/l. No open wounds. No interdigital macerations b/l. Toenails x10 are 3mm thick, discolored, dystrophic with subungual debris.  Mild incurvation of the bilateral hallux  nail, bilateral borders.  There is pain with compression of the nail plates.  They are elongated x10.  There are small areas of flaky dry skin on the plantar aspect of both feet with 3 tiny seed corns near the toe sulcus bilateral.      Latest Ref Rng & Units 01/07/2023    3:06 PM  Hemoglobin A1C  Hemoglobin-A1c 4.6 - 6.5 % 5.9    Assessment/Plan: 1. Pain due to onychomycosis of toenails of both feet   2. Xerosis of skin    The mycotic toenails were sharply debrided x10 with sterile nail nippers and a power debriding burr to decrease bulk/thickness and length.    The hyperkeratotic lesions were sanded.  Recommended patient purchase 40% urea cream +2% salicylic acid on [employer] to apply to the ball of the feet every night.  This should help with the dry  skin and any dead skin buildup.  Discussed a PNA of the hallux nail borders if she continues to feel ingrowing nails after today's debridement.  She will call if she needs to proceed with this procedure.  Return in about 3 months (around 02/13/2024) for RFC.   Katrina Benitez, DPM, FACFAS [facility]     [address] [place], [place] [address]                Office [phone]  Fax [phone]

## 2023-11-17 ENCOUNTER — Ambulatory Visit: Payer: Medicare Other | Admitting: Podiatry

## 2023-11-21 ENCOUNTER — Other Ambulatory Visit: Payer: Self-pay | Admitting: Internal Medicine

## 2023-11-23 ENCOUNTER — Encounter: Payer: Self-pay | Admitting: Podiatry

## 2023-11-23 ENCOUNTER — Ambulatory Visit (INDEPENDENT_AMBULATORY_CARE_PROVIDER_SITE_OTHER): Admitting: Podiatry

## 2023-11-23 ENCOUNTER — Ambulatory Visit (INDEPENDENT_AMBULATORY_CARE_PROVIDER_SITE_OTHER): Payer: Medicare Other

## 2023-11-23 DIAGNOSIS — I480 Paroxysmal atrial fibrillation: Secondary | ICD-10-CM | POA: Diagnosis not present

## 2023-11-23 DIAGNOSIS — L600 Ingrowing nail: Secondary | ICD-10-CM

## 2023-11-23 NOTE — Progress Notes (Signed)
 Chief Complaint  Patient presents with   Toe Pain    Hallux bilateral - had nails trimmed and filed by Dr. Carlota Raspberry, helped initially, but Friday she started to feel pain around the nails    Subjective: Patient presents today for evaluation of pain to the medial border of the bilateral great toes. Patient is concerned for possible ingrown nail.  It is very sensitive to touch.  Patient states that it has improved over the past few days.  Relatively new onset and she does not have a longstanding history of ingrown toenails.  Patient presents today for further treatment and evaluation.  Past Medical History:  Diagnosis Date   Allergy    Anemia    Anxiety    Arthritis    degenerative disc disorder   Cancer (HCC)    skin cancer   Cardiac arrhythmia    Cataract    surgery in right eye in 03/2022   Diverticulitis    Diverticulosis    GERD (gastroesophageal reflux disease)    Heart murmur    HTN (hypertension)    not on meds in 2 years per pt on 05/19/22 n   Infertility, female    Neuromuscular disorder (HCC)    idiopathic peripheral  neuropathy   Peripheral neuropathy    Positive PPD    SIADH (syndrome of inappropriate ADH production) (HCC)     Past Surgical History:  Procedure Laterality Date   BREAST BIOPSY Left    BREAST EXCISIONAL BIOPSY Left    benign   CATARACT EXTRACTION Right 2023   CERVICAL BIOPSY  W/ LOOP ELECTRODE EXCISION     CERVICAL POLYPECTOMY  02/2016   CHOLECYSTECTOMY N/A 06/02/2022   Procedure: LAPAROSCOPIC CHOLECYSTECTOMY WITH INTRAOPERATIVE CHOLANGIOGRAM;  Surgeon: Darnell Level, MD;  Location: [facility];  Service: General;  Laterality: N/A;   COLONOSCOPY     COLPOSCOPY     DILATION AND CURETTAGE OF UTERUS     HYSTEROSCOPY     LOOP RECORDER IMPLANT     LUMBAR DISC SURGERY     LUMBAR LAMINECTOMY     TONSILLECTOMY     UPPER GASTROINTESTINAL ENDOSCOPY      Allergies  Allergen Reactions   Flagyl [Metronidazole] Hives   Hydromorphone Hives   Iodixanol  Other (See Comments) and Swelling    Throat swelling  Other Reaction(s): Other (See Comments)  Throat swelling, Throat swelling, Throat swelling, Other reaction(s): Other, Other (See Comments), Throat swelling, Throat swelling, Throat swelling, Throat swelling, Throat swelling   Fentanyl Itching   Cipro [Ciprofloxacin Hcl] Hives   Dilaudid [Hydromorphone Hcl] Hives    Objective:  General: Well developed, nourished, in no acute distress, alert and oriented x3   Dermatology: Skin is warm, dry and supple bilateral.  Medial border bilateral great toes is tender with evidence of an ingrowing nail. Pain on palpation noted to the border of the nail fold. The remaining nails appear unremarkable at this time.   Vascular: DP and PT pulses palpable.  No clinical evidence of vascular compromise  Neruologic: Grossly intact via light touch bilateral.  Musculoskeletal: No pedal deformity noted  Assesement: #1 Paronychia with ingrowing nail medial border bilateral great toes  Plan of Care:  -Patient evaluated.  -For now we will pursue conservative treatment since the patient has really not had any issues with ingrown toenails in the past and this is a relatively new onset and it does demonstrate some improvement over the last few days -Prescription for mupirocin ointment.  Apply daily  under occlusion with a Band-Aid -Recommend wide fitting shoes that do not constrict the toebox area -Return to clinic as needed  *Retired radiologist  Felecia Shelling, DPM [facility]  Dr. Felecia Shelling, DPM    [address] [place], [place] [address]                Office [phone]  Fax [phone]

## 2023-11-25 ENCOUNTER — Telehealth: Payer: Self-pay | Admitting: Podiatry

## 2023-11-25 LAB — CUP PACEART REMOTE DEVICE CHECK
Date Time Interrogation Session: 20250302230958
Implantable Pulse Generator Implant Date: 20230614

## 2023-11-25 NOTE — Telephone Encounter (Signed)
 Patient called and wanted to make sure her medication was sent to [facility] in [place] on [address]. Thank you.

## 2023-11-27 ENCOUNTER — Other Ambulatory Visit (INDEPENDENT_AMBULATORY_CARE_PROVIDER_SITE_OTHER)

## 2023-11-27 ENCOUNTER — Other Ambulatory Visit: Payer: Self-pay | Admitting: *Deleted

## 2023-11-27 ENCOUNTER — Telehealth: Payer: Self-pay | Admitting: Podiatry

## 2023-11-27 DIAGNOSIS — R7303 Prediabetes: Secondary | ICD-10-CM | POA: Diagnosis not present

## 2023-11-27 DIAGNOSIS — E222 Syndrome of inappropriate secretion of antidiuretic hormone: Secondary | ICD-10-CM

## 2023-11-27 DIAGNOSIS — Z79899 Other long term (current) drug therapy: Secondary | ICD-10-CM

## 2023-11-27 DIAGNOSIS — H5712 Ocular pain, left eye: Secondary | ICD-10-CM | POA: Diagnosis not present

## 2023-11-27 DIAGNOSIS — H2000 Unspecified acute and subacute iridocyclitis: Secondary | ICD-10-CM | POA: Diagnosis not present

## 2023-11-27 LAB — BASIC METABOLIC PANEL
BUN: 24 mg/dL — ABNORMAL HIGH (ref 6–23)
CO2: 23 meq/L (ref 19–32)
Calcium: 9.3 mg/dL (ref 8.4–10.5)
Chloride: 104 meq/L (ref 96–112)
Creatinine, Ser: 1.09 mg/dL (ref 0.40–1.20)
GFR: 48.97 mL/min — ABNORMAL LOW (ref >60.00)
Glucose, Bld: 97 mg/dL (ref 70–99)
Potassium: 4.9 meq/L (ref 3.5–5.1)
Sodium: 138 meq/L (ref 135–145)

## 2023-11-27 LAB — HEMOGLOBIN A1C: Hgb A1c MFr Bld: 5.6 % (ref 4.6–6.5)

## 2023-11-27 MED ORDER — MUPIROCIN 2 % EX OINT: 1.0000 | TOPICAL_OINTMENT | Freq: Two times a day (BID) | CUTANEOUS | 0 refills | Status: AC

## 2023-11-27 NOTE — Telephone Encounter (Signed)
 Mupirocin sent to pharmacy, Joni Reining notified pt

## 2023-11-27 NOTE — Telephone Encounter (Signed)
 Patient called following up her RX was not at the pharmacy from her appt Monday. RX sent to [facility] pharmacy for pt.

## 2023-11-30 ENCOUNTER — Ambulatory Visit: Admitting: Podiatry

## 2023-11-30 NOTE — Progress Notes (Signed)
 Carelink Summary Report / Loop Recorder

## 2023-12-01 ENCOUNTER — Encounter: Payer: Self-pay | Admitting: Family Medicine

## 2023-12-01 NOTE — Progress Notes (Signed)
 Sugars better.  A little on the dehydrated side.  Sodium ok

## 2023-12-02 ENCOUNTER — Ambulatory Visit (HOSPITAL_BASED_OUTPATIENT_CLINIC_OR_DEPARTMENT_OTHER): Admitting: Obstetrics & Gynecology

## 2023-12-02 ENCOUNTER — Encounter (HOSPITAL_BASED_OUTPATIENT_CLINIC_OR_DEPARTMENT_OTHER): Payer: Self-pay | Admitting: Obstetrics & Gynecology

## 2023-12-02 VITALS — BP 148/75 | HR 86

## 2023-12-02 DIAGNOSIS — R300 Dysuria: Secondary | ICD-10-CM | POA: Diagnosis not present

## 2023-12-02 DIAGNOSIS — R3915 Urgency of urination: Secondary | ICD-10-CM | POA: Diagnosis not present

## 2023-12-02 DIAGNOSIS — N952 Postmenopausal atrophic vaginitis: Secondary | ICD-10-CM

## 2023-12-02 LAB — POCT URINALYSIS DIPSTICK
Bilirubin, UA: NEGATIVE
Glucose, UA: NEGATIVE
Ketones, UA: NEGATIVE
Nitrite, UA: NEGATIVE
Protein, UA: NEGATIVE
Spec Grav, UA: 1.015 (ref 1.010–1.025)
Urobilinogen, UA: 0.2 U/dL
pH, UA: 5.5 (ref 5.0–8.0)

## 2023-12-02 MED ORDER — AMOXICILLIN-POT CLAVULANATE 875-125 MG PO TABS: 1.0000 | ORAL_TABLET | Freq: Two times a day (BID) | ORAL | 0 refills | Status: DC

## 2023-12-02 MED ORDER — ESTRADIOL 0.1 MG/GM VA CREA: TOPICAL_CREAM | VAGINAL | 1 refills | Status: DC

## 2023-12-02 NOTE — Progress Notes (Unsigned)
 GYNECOLOGY  VISIT  CC:   Urinary concerns  HPI: 78 y.o. G0P0000 Married White or Caucasian female here for complaint of urinary urgency and some dysuria.  She's getting up at night several times and having urinary incontinence as well.  She's had some issues with hematuria but not seeing any blood in her urine right now.  Denies fevers.  Does seem to have some difficulty with completely emptying bladder.  She feels that there is a vaginal bulge and she is concerned about prolapse.  Denies any vaginal bleeding.    In November, had positive urine culture that showed e coli.  Was multiple drug resistant but was sensitive to augmentin. She was seen again by different provider in December.  Culture was repeated and was negative.  Was retreated with augmentin and also treated with rocephin.   Felt like that "really knocked it out".  Do not feel IM rocephin is appropriate she is has no fever/chills or symptoms c/w pyelonephritis.  She has been seen by urogyn at [facility] with virtual visit last fall.  Was not satisfied so declined follow up there.  Has had two separate referrals to urogyn here at [facility] and did have an appt earlier this year on both 2/4 and 2/12.  Reports having trouble finding locations so appointments cancelled.  Appt has been rescheduled to May 30  She is now feeling there is some urgency to getting an appointment.  Has called around and has an appt three days earlier, on May 27, with Urogyn at [facility].     Past Medical History:  Diagnosis Date   Allergy    Anemia    Anxiety    Arthritis    degenerative disc disorder   Cancer (HCC)    skin cancer   Cardiac arrhythmia    Cataract    surgery in right eye in 03/2022   Diverticulitis    Diverticulosis    GERD (gastroesophageal reflux disease)    Heart murmur    HTN (hypertension)    not on meds in 2 years per pt on 05/19/22 n   Infertility, female    Neuromuscular disorder (HCC)    idiopathic peripheral  neuropathy   Peripheral  neuropathy    Positive PPD    SIADH (syndrome of inappropriate ADH production) (HCC)     MEDS:   Current Outpatient Medications on File Prior to Visit  Medication Sig Dispense Refill   Alum Hydroxide-Mag Carbonate (GAVISCON EXTRA STRENGTH) 160-105 MG CHEW Chew 2 tablets by mouth daily as needed (upset stomach).     betamethasone valerate (VALISONE) 0.1 % cream APPLY TWICE DAILY 45 g 0   betamethasone valerate ointment (VALISONE) 0.1 % Apply 1 Application topically 2 (two) times daily as needed (itching).     Cholecalciferol 50 MCG (2000 UT) CAPS Take 4,000 Units by mouth daily.     diazepam (VALIUM) 10 MG tablet TAKE ONE TABLET EVERY TWELVE HOURS AS NEEDED FOR ANXIETY 60 tablet 5   diphenoxylate-atropine (LOMOTIL) 2.5-0.025 MG tablet TAKE ONE (1) TABLET FOUR TIMES EACH DAY AS NEEDED FOR DIARRHEA OR LOOSE STOOLS 120 tablet 1   ELIQUIS 5 MG TABS tablet TAKE ONE TABLET BY MOUTH TWICE DAILY 60 tablet 11   gabapentin (NEURONTIN) 600 MG tablet TAKE ONE TABLET BY MOUTH THREE TIMES DAILY 270 tablet 1   hydrOXYzine (VISTARIL) 25 MG capsule Two capsules 4 times daily 720 capsule 1   mupirocin ointment (BACTROBAN) 2 % Apply 1 Application topically 2 (two) times daily. 22  g 0   omeprazole (PRILOSEC) 40 MG capsule TAKE ONE CAPSULE EACH DAY 90 capsule 1   ondansetron (ZOFRAN-ODT) 8 MG disintegrating tablet TAKE ONE TABLET EVERY EIGHT HOURS AS NEEDED FOR NAUSEA AND VOMITING 20 tablet 1   Polyethyl Glyc-Propyl Glyc PF (SYSTANE HYDRATION PF) 0.4-0.3 % SOLN Place 1 drop into both eyes daily as needed (dry eyes).     Pramoxine HCl (CERAVE ITCH RELIEF) 1 % CREA Apply 1 Application topically daily as needed (itching).     Probiotic Product (ALIGN PO) Take 1 capsule by mouth daily.     sodium chloride 1 g tablet Take 1 g by mouth 2 (two) times daily with a meal.     tacrolimus (PROTOPIC) 0.1 % ointment Apply topically 2 (two) times daily.     triamcinolone cream (KENALOG) 0.1 % Apply 1 Application topically as  needed (itching).     zolpidem (AMBIEN) 10 MG tablet TAKE ONE TABLET BY MOUTH AT BEDTIME AS NEEDED FOR SLEEP 30 tablet 1   No current facility-administered medications on file prior to visit.    ALLERGIES: Flagyl [metronidazole], Hydromorphone, Iodixanol, Fentanyl, Cipro [ciprofloxacin hcl], and Dilaudid [hydromorphone hcl]  SH:  married, non smoker  Review of Systems  Constitutional: Negative.   Genitourinary:  Positive for frequency and urgency. Negative for hematuria.    PHYSICAL EXAMINATION:    BP (!) 148/75 (BP Location: Left Arm, Patient Position: Sitting)   Pulse 86   LMP  (LMP Unknown)     General appearance: alert, cooperative and appears stated age Abdomen: soft, non-tender; bowel sounds normal; no masses,  no organomegaly Lymph:  no inguinal LAD noted  Pelvic: External genitalia:  no lesions              Urethra:  normal appearing urethra with no masses, tenderness or lesions              Bartholins and Skenes: normal                 Vagina:  atrophic, no cystocele or rectocele, prominent urethra noted              Cervix: no lesions              Bimanual Exam:  Uterus:  normal size, contour, position, consistency, mobility, non-tender              Adnexa: no mass, fullness, tenderness  Chaperone, Raechel Ache, RN, was present for exam.  Assessment/Plan: 1. Dysuria (Primary) - POCT Urinalysis Dipstick - Urine Culture - will start treatment with augmentin 875 bid x 7 days.    2. Vaginal atrophy - really do feel she would benefit from some vaginal estrogen cream.  Instructions for use given.  Rx to pharmacy. - estradiol (ESTRACE) 0.1 MG/GM vaginal cream; 1 gram vaginally nightly x 7 - 10 days and then twice weekly  Dispense: 42.5 g; Refill: 1  3. Urinary urgency - will see if can expedite urogyn referral for local care  Total time with pt:  45 minutes Documentation: 7 minutes Total time with care:  52 minutes

## 2023-12-03 DIAGNOSIS — R300 Dysuria: Secondary | ICD-10-CM | POA: Diagnosis not present

## 2023-12-04 ENCOUNTER — Encounter (HOSPITAL_BASED_OUTPATIENT_CLINIC_OR_DEPARTMENT_OTHER): Payer: Self-pay | Admitting: Obstetrics & Gynecology

## 2023-12-06 LAB — URINE CULTURE

## 2023-12-07 ENCOUNTER — Encounter: Payer: Medicare Other | Admitting: Family Medicine

## 2023-12-09 NOTE — Progress Notes (Unsigned)
 New Patient Evaluation and Consultation  Referring Provider: Moshe Cipro, FNP PCP: Jeani Sow, MD Date of Service: 12/10/2023  SUBJECTIVE Chief Complaint: No chief complaint on file.  History of Present Illness: Katrina Benitez is a 78 y.o. White or Caucasian female seen in consultation at the request of NP Ashley Royalty for evaluation of vaginal bulge, urinary urgency, dysuria, and incontinence.    Sensation of incomplete emptying History of hematuria  Urine culture with MDRO E. Coli sensitive to Augmentin. Negative repeat urine culture, Rx augmentin and rocephin.   Evaluated by urology at [facility] last 06/2023 Per chart review, dysuria started 2022 with negative UA. Reports urgency incontinence with frequency and urgency. Improved after vaginal estrogen, Azo, mirabegron but discontinued. Constipation improved with colace.  "RUS [place] 12/25/2021: Right Kidney: Length: 9.5 Cm. This measures notably smaller than the left but no cortical thinning, question differences related to the  obliquity of measurement. Echogenicity within normal limits. No mass or hydronephrosis visualized.   Left Kidney: Length: 11 cm. Echogenicity within normal limits. No mass or hydronephrosis visualized."   ***Review of records significant for: ***A. Fib with RVR, cancer, gait abnormality, peripheral neuropathy, neuromuscular disorder, SIADH, vulvodynia, Pre-DM  US abdomen 12/25/21 "CLINICAL DATA:  Rule out gallstones   EXAM: ABDOMEN ULTRASOUND COMPLETE   COMPARISON:  10/17/2015   FINDINGS: Gallbladder: Multiple shadowing gallstones which are layering. No gallbladder wall thickening or focal tenderness.   Common bile duct: Diameter: 2 mm   Liver: No focal lesion identified. Within normal limits in parenchymal echogenicity. Portal vein is patent on color Doppler imaging with normal direction of blood flow towards the liver.   IVC: No abnormality visualized.   Pancreas: Visualized  portion unremarkable.   Spleen: Size and appearance within normal limits.   Right Kidney: Length: 9.5 Cm. This measures notably smaller than the left but no cortical thinning, question differences related to the obliquity of measurement. Echogenicity within normal limits. No mass or hydronephrosis visualized.   Left Kidney: Length: 11 cm. Echogenicity within normal limits. No mass or hydronephrosis visualized.   Abdominal aorta: No aneurysm visualized.   IMPRESSION: Numerous gallstones since 2017.     Electronically Signed   By: Tiburcio Pea M.D.   On: 12/26/2021 13:30"  Urinary Symptoms: {urine leakage?:24754} Leaks *** time(s) per {days/wks/mos/yrs:310907}.  Pad use: {NUMBERS 1-10:18281} {pad option:24752} per day.   Patient {ACTION; IS/IS ZOX:09604540} bothered by UI symptoms.  Day time voids ***.  Nocturia: *** times per night to void with insomnia Voiding dysfunction:  {empties:24755} bladder well.  Patient {DOES NOT does:27190::"does not"} use a catheter to empty bladder.  When urinating, patient feels {urine symptoms:24756} Drinks: *** per day : 2-3 mugs of Hot tea (Breakfast blend-caffeinated), coke zero, gatorade, glass of wine in the evening   UTIs: {NUMBERS 1-10:18281} UTI's in the last year.   {ACTIONS;DENIES/REPORTS:21021675::"Denies"} history of {urologic concerns:24757} No results found for the last 90 days.   Pelvic Organ Prolapse Symptoms:                  Patient {denies/ admits to:24761} a feeling of a bulge the vaginal area. It has been present for {NUMBER 1-10:22536} {days/wks/mos/yrs:310907}.  Patient {denies/ admits to:24761} seeing a bulge.  This bulge {ACTION; IS/IS JWJ:19147829} bothersome.  Bowel Symptom: Bowel movements: *** time(s) per {Time; day/week/month:13537} with history of diverticulitis  Stool consistency: {stool consistency:24758} Straining: {yes/no:19897}.  Splinting: {yes/no:19897}.  Incomplete evacuation: {yes/no:19897}.   Patient {denies/ admits to:24761} accidental bowel leakage / fecal incontinence  Occurs: *** time(s) per {Time; day/week/month:13537}  Consistency with leakage: {stool consistency:24758} Bowel regimen: {bowel regimen:24759} Last colonoscopy: Date ***, Results *** [facility] Colonoscopy          Completed or No Longer Recommended     Colonoscopy  Discontinued      Frequency changed to Never automatically (Topic No Longer Applies)   06/07/2019  COLONOSCOPY   Only the first 1 history entries have been loaded, but more history exists.                Sexual Function Sexually active: {yes/no:19897}.  Sexual orientation: {Sexual Orientation:[phone]} Pain with sex: {pain with sex:24762}  Pelvic Pain {denies/ admits to:24761} pelvic pain Location: *** Pain occurs: *** Prior pain treatment: *** Improved by: *** Worsened by: ***   Past Medical History:  Past Medical History:  Diagnosis Date   Allergy    Anemia    Anxiety    Arthritis    degenerative disc disorder   Cancer (HCC)    skin cancer   Cardiac arrhythmia    Cataract    surgery in right eye in 03/2022   Diverticulitis    Diverticulosis    GERD (gastroesophageal reflux disease)    Heart murmur    HTN (hypertension)    not on meds in 2 years per pt on 05/19/22 n   Infertility, female    Neuromuscular disorder (HCC)    idiopathic peripheral  neuropathy   Peripheral neuropathy    Positive PPD    SIADH (syndrome of inappropriate ADH production) (HCC)      Past Surgical History:   Past Surgical History:  Procedure Laterality Date   BREAST BIOPSY Left    BREAST EXCISIONAL BIOPSY Left    benign   CATARACT EXTRACTION Right 2023   CERVICAL BIOPSY  W/ LOOP ELECTRODE EXCISION     CERVICAL POLYPECTOMY  02/2016   CHOLECYSTECTOMY N/A 06/02/2022   Procedure: LAPAROSCOPIC CHOLECYSTECTOMY WITH INTRAOPERATIVE CHOLANGIOGRAM;  Surgeon: Darnell Level, MD;  Location: [facility];  Service: General;  Laterality: N/A;    COLONOSCOPY     COLPOSCOPY     DILATION AND CURETTAGE OF UTERUS     HYSTEROSCOPY     LOOP RECORDER IMPLANT     LUMBAR DISC SURGERY     LUMBAR LAMINECTOMY     TONSILLECTOMY     UPPER GASTROINTESTINAL ENDOSCOPY       Past OB/GYN History: OB History  Gravida Para Term Preterm AB Living  0 0 0 0 0 0  SAB IAB Ectopic Multiple Live Births  0 0 0 0 0    Vaginal deliveries: ***,  Forceps/ Vacuum deliveries: ***, Cesarean section: *** Menopausal: {menopausal:24763} Contraception: ***. Last pap smear was ***.  Any history of abnormal pap smears: {yes/no:19897}.    Component Value Date/Time   DIAGPAP - Atrophic vaginitis 04/17/2023 1225   DIAGPAP  04/17/2021 1542    - Negative for intraepithelial lesion or malignancy (NILM)   DIAGPAP - Atrophic vaginitis 04/17/2021 1542   ADEQPAP  04/17/2023 1225    Satisfactory for evaluation. The presence or absence of an   ADEQPAP  04/17/2023 1225    endocervical/transformation zone component cannot be determined because   ADEQPAP of atrophy. 04/17/2023 1225    Medications: Patient has a current medication list which includes the following prescription(s): gaviscon extra strength, amoxicillin-clavulanate, betamethasone valerate, betamethasone valerate ointment, cholecalciferol, diazepam, diphenoxylate-atropine, eliquis, estradiol, gabapentin, hydroxyzine, mupirocin ointment, omeprazole, ondansetron, systane hydration pf, cerave itch relief, probiotic product, sodium chloride, tacrolimus,  triamcinolone cream, and zolpidem.   Allergies: Patient is allergic to flagyl [metronidazole], hydromorphone, iodixanol, fentanyl, cipro [ciprofloxacin hcl], and dilaudid [hydromorphone hcl].   Social History:  Social History   Tobacco Use   Smoking status: Never   Smokeless tobacco: Never  Vaping Use   Vaping status: Never Used  Substance Use Topics   Alcohol use: Yes    Alcohol/week: 7.0 standard drinks of alcohol    Types: 7 Glasses of wine per week     Comment: 102 glsses wine daily   Drug use: No    Relationship status: {relationship status:24764} Patient lives with ***.   Patient {ACTION; IS/IS ZOX:09604540} employed ***. Regular exercise: {Yes/No:304960894} History of abuse: {Yes/No:304960894}  Family History:   Family History  Problem Relation Age of Onset   Breast cancer Mother    Glaucoma Mother    Miscarriages / Stillbirths Mother    Heart attack Father    Depression Father    Early death Father    Heart disease Father    Tuberculosis Paternal Grandmother    Prostate cancer Paternal Grandfather    Depression Brother    Hypertension Brother    Colon cancer Neg Hx    Esophageal cancer Neg Hx    Rectal cancer Neg Hx    Stomach cancer Neg Hx      Review of Systems: ROS   OBJECTIVE Physical Exam: There were no vitals filed for this visit.  Physical Exam   GU / Detailed Urogynecologic Evaluation:  Pelvic Exam: Normal external female genitalia; Bartholin's and Skene's glands normal in appearance; urethral meatus normal in appearance, no urethral masses or discharge.   CST: {gen negative/positive:315881}  Reflexes: bulbocavernosis {DESC; PRESENT/NOT PRESENT:21021351}, anocutaneous {DESC; PRESENT/NOT PRESENT:21021351} ***bilaterally.  Speculum exam reveals normal vaginal mucosa {With/Without:20273} atrophy. Cervix {exam; gyn cervix:30847}. Uterus {exam; pelvic uterus:30849}. Adnexa {exam; adnexa:12223}.    s/p hysterectomy: Speculum exam reveals normal vaginal mucosa {With/Without:20273}  atrophy and normal vaginal cuff.  Adnexa {exam; adnexa:12223}.    With apex supported, anterior compartment defect was {reduced:24765}  Pelvic floor strength {Roman # I-V:19040}/V, puborectalis {Roman # I-V:19040}/V external anal sphincter {Roman # I-V:19040}/V  Pelvic floor musculature: Right levator {Tender/Non-tender:20250}, Right obturator {Tender/Non-tender:20250}, Left levator {Tender/Non-tender:20250}, Left obturator  {Tender/Non-tender:20250}  POP-Q:   POP-Q                                               Aa                                               Ba                                                 C                                                Gh  Pb                                               tvl                                                Ap                                               Bp                                                 D      Rectal Exam:  Normal sphincter tone, {rectocele:24766} distal rectocele, enterocoele {DESC; PRESENT/NOT PRESENT:21021351}, no rectal masses, {sign of:24767} dyssynergia when asking the patient to bear down.  Post-Void Residual (PVR) by Bladder Scan: In order to evaluate bladder emptying, we discussed obtaining a postvoid residual and patient agreed to this procedure.  Procedure: The ultrasound unit was placed on the patient's abdomen in the suprapubic region after the patient had voided.      Laboratory Results: Lab Results  Component Value Date   COLORU Yellow 12/02/2023   CLARITYU Clear 12/02/2023   GLUCOSEUR Negative 12/02/2023   BILIRUBINUR Negative 12/02/2023   KETONESU Negative 12/02/2023   SPECGRAV 1.015 12/02/2023   RBCUR Small 12/02/2023   PHUR 5.5 12/02/2023   PROTEINUR Negative 12/02/2023   UROBILINOGEN 0.2 12/02/2023   LEUKOCYTESUR Trace (A) 12/02/2023    Lab Results  Component Value Date   CREATININE 1.09 11/27/2023   CREATININE 0.90 08/26/2023   CREATININE 0.94 07/20/2023    Lab Results  Component Value Date   HGBA1C 5.6 11/27/2023    Lab Results  Component Value Date   HGB 11.5 (L) 01/07/2023     ASSESSMENT AND PLAN Ms. Branscom is a 78 y.o. with: No diagnosis found.  There are no diagnoses linked to this encounter.   Loleta Chance, MD

## 2023-12-10 ENCOUNTER — Encounter: Payer: Self-pay | Admitting: Obstetrics

## 2023-12-10 ENCOUNTER — Ambulatory Visit (INDEPENDENT_AMBULATORY_CARE_PROVIDER_SITE_OTHER): Admitting: Obstetrics

## 2023-12-10 ENCOUNTER — Ambulatory Visit (INDEPENDENT_AMBULATORY_CARE_PROVIDER_SITE_OTHER): Payer: Medicare Other | Admitting: Family Medicine

## 2023-12-10 ENCOUNTER — Telehealth (HOSPITAL_BASED_OUTPATIENT_CLINIC_OR_DEPARTMENT_OTHER): Payer: Self-pay | Admitting: *Deleted

## 2023-12-10 ENCOUNTER — Other Ambulatory Visit (HOSPITAL_COMMUNITY)
Admission: RE | Admit: 2023-12-10 | Discharge: 2023-12-10 | Disposition: A | Discharge status: Home / Self Care | Source: Other Acute Inpatient Hospital | Attending: Obstetrics | Admitting: Obstetrics

## 2023-12-10 VITALS — BP 122/72 | HR 83 | Temp 98.7°F | Ht 64.76 in | Wt 193.0 lb

## 2023-12-10 VITALS — BP 121/75 | HR 81 | Ht 64.76 in | Wt 193.0 lb

## 2023-12-10 DIAGNOSIS — R3914 Feeling of incomplete bladder emptying: Secondary | ICD-10-CM | POA: Insufficient documentation

## 2023-12-10 DIAGNOSIS — E559 Vitamin D deficiency, unspecified: Secondary | ICD-10-CM | POA: Diagnosis not present

## 2023-12-10 DIAGNOSIS — R3989 Other symptoms and signs involving the genitourinary system: Secondary | ICD-10-CM | POA: Insufficient documentation

## 2023-12-10 DIAGNOSIS — F419 Anxiety disorder, unspecified: Secondary | ICD-10-CM

## 2023-12-10 DIAGNOSIS — I10 Essential (primary) hypertension: Secondary | ICD-10-CM | POA: Diagnosis not present

## 2023-12-10 DIAGNOSIS — Z87440 Personal history of urinary (tract) infections: Secondary | ICD-10-CM | POA: Diagnosis present

## 2023-12-10 DIAGNOSIS — E222 Syndrome of inappropriate secretion of antidiuretic hormone: Secondary | ICD-10-CM | POA: Diagnosis not present

## 2023-12-10 DIAGNOSIS — R7303 Prediabetes: Secondary | ICD-10-CM

## 2023-12-10 DIAGNOSIS — M25469 Effusion, unspecified knee: Secondary | ICD-10-CM | POA: Diagnosis not present

## 2023-12-10 DIAGNOSIS — N94819 Vulvodynia, unspecified: Secondary | ICD-10-CM

## 2023-12-10 DIAGNOSIS — N952 Postmenopausal atrophic vaginitis: Secondary | ICD-10-CM | POA: Diagnosis not present

## 2023-12-10 DIAGNOSIS — N3941 Urge incontinence: Secondary | ICD-10-CM | POA: Insufficient documentation

## 2023-12-10 DIAGNOSIS — M6289 Other specified disorders of muscle: Secondary | ICD-10-CM

## 2023-12-10 DIAGNOSIS — F339 Major depressive disorder, recurrent, unspecified: Secondary | ICD-10-CM | POA: Diagnosis not present

## 2023-12-10 DIAGNOSIS — I480 Paroxysmal atrial fibrillation: Secondary | ICD-10-CM | POA: Diagnosis not present

## 2023-12-10 DIAGNOSIS — K5900 Constipation, unspecified: Secondary | ICD-10-CM | POA: Insufficient documentation

## 2023-12-10 DIAGNOSIS — Z1231 Encounter for screening mammogram for malignant neoplasm of breast: Secondary | ICD-10-CM

## 2023-12-10 DIAGNOSIS — G609 Hereditary and idiopathic neuropathy, unspecified: Secondary | ICD-10-CM

## 2023-12-10 DIAGNOSIS — B0229 Other postherpetic nervous system involvement: Secondary | ICD-10-CM

## 2023-12-10 DIAGNOSIS — K219 Gastro-esophageal reflux disease without esophagitis: Secondary | ICD-10-CM | POA: Diagnosis not present

## 2023-12-10 LAB — POCT URINALYSIS DIPSTICK
Bilirubin, UA: NEGATIVE
Blood, UA: NEGATIVE
Glucose, UA: NEGATIVE
Ketones, UA: NEGATIVE
Leukocytes, UA: NEGATIVE
Nitrite, UA: NEGATIVE
Protein, UA: NEGATIVE
Spec Grav, UA: 1.015 (ref 1.010–1.025)
Urobilinogen, UA: 0.2 U/dL
pH, UA: 6 (ref 5.0–8.0)

## 2023-12-10 MED ORDER — LIDOCAINE 5 % EX OINT: TOPICAL_OINTMENT | CUTANEOUS | 0 refills | Status: DC

## 2023-12-10 NOTE — Assessment & Plan Note (Signed)
-   We discussed the symptoms of overactive bladder (OAB), which include urinary urgency, urinary frequency, nocturia, with or without urge incontinence.  While we do not know the exact etiology of OAB, several treatment options exist. We discussed management including behavioral therapy (decreasing bladder irritants, urge suppression strategies, timed voids, bladder retraining), physical therapy, medication; for refractory cases posterior tibial nerve stimulation, sacral neuromodulation, and intravesical botulinum toxin injection.  For anticholinergic medications, we discussed the potential side effects of anticholinergics including dry eyes, dry mouth, constipation, cognitive impairment and urinary retention. For Beta-3 agonist medication, we discussed the potential side effect of elevated blood pressure which is more likely to occur in individuals with uncontrolled hypertension. - referral sent for pelvic floor PT - repeat postvoid residual at follow-up to r/o incomplete emptying - consider trial of Gemtesa if PVR WNL

## 2023-12-10 NOTE — Assessment & Plan Note (Addendum)
-   reviewed vulvodynia, reviewed comfort measures and treatment options. Discussed association with neuropathic pain due to history of peripheral neuropathy  - Rx topical lidocaine 1g PRN pain up to 3x/day - consider Rx Amitriptyline 2.5%/ gabapentin 2.5%/ baclofen 2.5% in vaginal cream. Sent to compounding pharmacy for use daily - discussed conservative management options with cold compress as needed for comfort - pending pelvic floor PT appointment with referral sent - discussed proper vulvar care, warm compression, avoid pad use, cotton only underwear and barrier ointment if needed  - consider nuswab if refractory symptoms or biopsy

## 2023-12-10 NOTE — Assessment & Plan Note (Signed)
-   For symptomatic vaginal atrophy options include lubrication with a water-based lubricant, personal hygiene measures and barrier protection against wetness, and estrogen replacement in the form of vaginal cream, vaginal tablets, or a time-released vaginal ring.   - continue vaginal estrogen

## 2023-12-10 NOTE — Assessment & Plan Note (Addendum)
-   denies UTI symptoms today, POCT clean catch UA + trace leuk/+ heme, catheterized urine testing negative - pending urine culture, on amoxicillin for bronchitis - intermittent episodes of discomfort with negative cultures - denies kidney stones or hospitalization for pyelonephritis  - For treatment of recurrent urinary tract infections, we discussed management of recurrent UTIs including prophylaxis with a daily low dose antibiotic, transvaginal estrogen therapy, D-mannose, and cranberry supplements.  We discussed the role of diagnostic testing such as cystoscopy and upper tract imaging.   - continue low dose vaginal estrogen and probiotics - discussed overlapping symptomatology with bladder pain. 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  We also discussed using pyridium and similar over-the-counter medications for pain relief as needed. We discussed the pentad of medications including Elmiron, Tums, an antihistamine such as Vistaril, amitriptyline, and L-arginine.  We also discussed in-office bladder instillations for pain flares, as well as cystoscopy with hydrodistention in the operating room, which can be both diagnostic and therapeutic. She was also given information on the [employer] Network at https://www.ic-network.com for bladder diet suggestions and patient forums for support. - continue hydroxyzine for idiopathic generalized itching, discussed risks of anticholinergic medications

## 2023-12-10 NOTE — Assessment & Plan Note (Signed)
-   catheterized for , after void - repeat at follow-up - possibly due to myofascial pain and constipation - reassess after optimization of stool consistency and referral to pelvic floor PT - discussed history of peripheral neuropathy, reviewed need for urodynamics and renal imaging if abnormal bladder emptying.  - Cr 1.09 on 11/27/23

## 2023-12-10 NOTE — Assessment & Plan Note (Addendum)
-   started since vistaril use, bristol I, III, IV on colace - For constipation, we reviewed the importance of a better bowel regimen.  We also discussed the importance of avoiding chronic straining, as it can exacerbate her pelvic floor symptoms; we discussed treating constipation and straining prior to surgery, as postoperative straining can lead to damage to the repair and recurrence of symptoms. We discussed initiating therapy with increasing fluid intake, fiber supplementation, stool softeners, and laxatives such as miralax.  - stop colace and start titration of miralax - discussed association with sensation of incomplete bladder emptying

## 2023-12-10 NOTE — Telephone Encounter (Signed)
 Informed pt that upper respiratory infection should not prolong her UTI. Pt has since seen urogynecology. Informed pt that  Dr. Lucia Gaskins reviewed her records and given no cause was found, she recommended starting with her PCP to check some blood work (like B12 and some other commonly checked labs).  If there is a new lab abnormality, then she would be happy to see her.  For now, she didn't think a referral was needed. Pt verbalizes understanding and has appt with new PCP today that she feels will be a good fit.

## 2023-12-10 NOTE — Patient Instructions (Signed)
 For treatment of recurrent urinary tract infections, we discussed management of recurrent UTIs including prophylaxis with a daily low dose antibiotic, transvaginal estrogen therapy, D-mannose, and cranberry supplements.  We discussed the role of diagnostic testing such as cystoscopy and upper tract imaging.     For vaginal atrophy (thinning of the vaginal tissue that can cause dryness and burning) and UTI prevention we discussed estrogen replacement in the form of vaginal cream.   Start vaginal estrogen therapy nightly for two weeks then 2 times weekly at night. This can be placed with your finger or an applicator inside the vagina and around the urethra.  Please let us know if the prescription is too expensive and we can look for alternative options.   Is vaginal estrogen therapy safe for me? Vaginal estrogen preparations act on the vaginal skin, and only a very tiny amount is absorbed into the bloodstream (0.01%).  They work in a similar way to hand or face cream.  There is minimal absorption and they are therefore perfectly safe. If you have had breast cancer and have persistent troublesome symptoms which aren't settling with vaginal moisturisers and lubricants, local estrogen treatment may be a possibility, but consultation with your oncologist should take place first.   Constipation: Our goal is to achieve formed bowel movements daily or every-other-day.  You may need to try different combinations of the following options to find what works best for you - everybody's body works differently so feel free to adjust the dosages as needed.  Some options to help maintain bowel health include:  Dietary changes (more leafy greens, vegetables and fruits; less processed foods) Fiber supplementation (Benefiber, FiberCon, Metamucil or Psyllium). Start slow and increase gradually to full dose. Over-the-counter agents such as: stool softeners (Docusate or Colace) and/or laxatives (Miralax, milk of magnesia)   "Power Pudding" is a natural mixture that may help your constipation.  To make blend 1 cup applesauce, 1 cup wheat bran, and 3/4 cup prune juice, refrigerate and then take 1 tablespoon daily with a large glass of water as needed.   We discussed the symptoms of overactive bladder (OAB), which include urinary urgency, urinary frequency, night-time urination, with or without urge incontinence.  We discussed management including behavioral therapy (decreasing bladder irritants by following a bladder diet, urge suppression strategies, timed voids, bladder retraining), physical therapy, medication; and for refractory cases posterior tibial nerve stimulation, sacral neuromodulation, and intravesical botulinum toxin injection.   For Beta-3 agonist medication, we discussed the potential side effect of elevated blood pressure which is more likely to occur in individuals with uncontrolled hypertension. You were given samples for Gemtesa 75 mg.  It can take a month to start working so give it time, but if you have bothersome side effects call sooner and we can try a different medication.  Call us if you have trouble filling the prescription or if it's not covered by your insurance.  - provided handout regarding vulvodynia, reviewed comfort measures and treatment options.  - Rx topical lidocaine 1g PRN pain up to 3x/day - Consider Rx Amitriptyline 2.5%/ gabapentin 2.5%/ baclofen 2.5% in vaginal cream. Sent to compounding pharmacy for use daily - discussed conservative management options with cold compress for comfort - referral sent to pelvic floor PT appointment

## 2023-12-10 NOTE — Telephone Encounter (Signed)
-----   Message from Jerene Bears sent at 12/09/2023  5:45 AM EDT ----- Can you please let her know that that vast majority of upper respiratory infections are viral.  She should call Dr. Myrtie Cruise office so she can be tested for influenza since it is still around.  If she feels go enough for the appt on Thursday, she should wear a mask please.  Also, upper respiratory infections don't prolong UTIs.   If urogyn doesn't plan a repeat culture, then I still would like to do one.   Thanks.  MSM ----- Message ----- From: Harrie Jeans, RN Sent: 12/08/2023   8:51 AM EDT To: Jerene Bears, MD  I spoke with pt regarding results. She is seeing urogyn on Thursday. Do you still want her to come here for repeat culture or let them take over? She also has upper respiratory infection and is requesting to have additional abx sent bc she feels that her respiratory infection is going to cause the uti to linger or come back. Please advise. Thank you!  Selena Batten

## 2023-12-11 ENCOUNTER — Encounter: Payer: Self-pay | Admitting: Obstetrics

## 2023-12-11 ENCOUNTER — Encounter: Payer: Self-pay | Admitting: Family Medicine

## 2023-12-11 LAB — URINE CULTURE: Culture: NO GROWTH

## 2023-12-11 MED ORDER — FUROSEMIDE 20 MG PO TABS: 20.0000 mg | ORAL_TABLET | Freq: Every day | ORAL | 3 refills | Status: AC

## 2023-12-11 NOTE — Assessment & Plan Note (Signed)
 Continue current medication regimen.  Continue efforts in low sugar, low-carb diet and healthy activity level

## 2023-12-11 NOTE — Assessment & Plan Note (Signed)
 Continue current efforts and low acid diet, current medication regimen

## 2023-12-11 NOTE — Patient Instructions (Signed)
 Welcome to [facility]!  Thank you for choosing [place] for your Primary Care needs.   We offer in person and video appointments for your convenience. You may call our office to schedule appointments, or you may schedule appointments with me through MyChart.   The best way to get in contact with me is via MyChart message. This will get to me faster than a phone call, unless there is an emergency, then please call 911.  The lab is located downstairs in the Sports Medicine building, we also have xray available there.   Follow up with me in about 6 months for meds/labs, sooner if needed.

## 2023-12-11 NOTE — Assessment & Plan Note (Signed)
 Continue current medication regimen

## 2023-12-11 NOTE — Assessment & Plan Note (Signed)
 Continue current medication regimen, follow-up cardiology as scheduled

## 2023-12-11 NOTE — Assessment & Plan Note (Signed)
 Continue current medication regimen, exercise as tolerated

## 2023-12-11 NOTE — Progress Notes (Signed)
 New Patient Office Visit  Subjective    Patient ID: Katrina Benitez, female    DOB: 10/09/1945  Age: 78 y.o. MRN: [id number]  CC:  Chief Complaint  Patient presents with   Establish Care    Sinusitis, started about 5 days ago    HPI Katrina Benitez presents to establish care today. Has complex medical history including hypertension, A-fib, GERD, SIADH, postherpetic neuralgia, vitamin D deficiency, prediabetes, depression, anxiety, anemia, pelvic floor dysfunction, urge urinary incontinence. Reports compliance with medication regimen. Reports that she is currently taking a 10-day course of Augmentin for sinusitis. She has recently seen urogynecology, requesting referral to pelvic floor physical therapy. Requesting referral to neurology as well today. She is up-to-date on routine vaccinations. Declines DEXA, states she is not going to treat osteoporosis if it is found. She is due for mammogram, will order. She does receive regular eye and dental care. Denies other concerns today. Medical history as outlined below.  Outpatient Encounter Medications as of 12/10/2023  Medication Sig   Alum Hydroxide-Mag Carbonate (GAVISCON EXTRA STRENGTH) 160-105 MG CHEW Chew 2 tablets by mouth daily as needed (upset stomach).   amoxicillin-clavulanate (AUGMENTIN) 875-125 MG tablet Take 1 tablet by mouth 2 (two) times daily.   betamethasone valerate (VALISONE) 0.1 % cream APPLY TWICE DAILY   betamethasone valerate ointment (VALISONE) 0.1 % Apply 1 Application topically 2 (two) times daily as needed (itching).   Cholecalciferol 50 MCG (2000 UT) CAPS Take 4,000 Units by mouth daily.   diazepam (VALIUM) 10 MG tablet TAKE ONE TABLET EVERY TWELVE HOURS AS NEEDED FOR ANXIETY   diphenoxylate-atropine (LOMOTIL) 2.5-0.025 MG tablet TAKE ONE (1) TABLET FOUR TIMES EACH DAY AS NEEDED FOR DIARRHEA OR LOOSE STOOLS   ELIQUIS 5 MG TABS tablet TAKE ONE TABLET BY MOUTH TWICE DAILY   estradiol (ESTRACE) 0.1 MG/GM vaginal  cream 1 gram vaginally nightly x 7 - 10 days and then twice weekly   furosemide (LASIX) 20 MG tablet Take 1 tablet (20 mg total) by mouth daily.   gabapentin (NEURONTIN) 600 MG tablet TAKE ONE TABLET BY MOUTH THREE TIMES DAILY   hydrOXYzine (VISTARIL) 25 MG capsule Two capsules 4 times daily   lidocaine (XYLOCAINE) 5 % ointment Use 0.5g peasize up to 3 times a day as needed for pain   mupirocin ointment (BACTROBAN) 2 % Apply 1 Application topically 2 (two) times daily.   omeprazole (PRILOSEC) 40 MG capsule TAKE ONE CAPSULE EACH DAY   ondansetron (ZOFRAN-ODT) 8 MG disintegrating tablet TAKE ONE TABLET EVERY EIGHT HOURS AS NEEDED FOR NAUSEA AND VOMITING   Polyethyl Glyc-Propyl Glyc PF (SYSTANE HYDRATION PF) 0.4-0.3 % SOLN Place 1 drop into both eyes daily as needed (dry eyes).   Pramoxine HCl (CERAVE ITCH RELIEF) 1 % CREA Apply 1 Application topically daily as needed (itching).   Probiotic Product (ALIGN PO) Take 1 capsule by mouth daily.   sodium chloride 1 g tablet Take 1 g by mouth 2 (two) times daily with a meal.   tacrolimus (PROTOPIC) 0.1 % ointment Apply topically 2 (two) times daily.   triamcinolone cream (KENALOG) 0.1 % Apply 1 Application topically as needed (itching).   zolpidem (AMBIEN) 10 MG tablet TAKE ONE TABLET BY MOUTH AT BEDTIME AS NEEDED FOR SLEEP   No facility-administered encounter medications on file as of 12/10/2023.    Past Medical History:  Diagnosis Date   Allergy    Anemia    Anxiety    Arthritis    degenerative disc disorder  Cancer [facility])    skin cancer   Cardiac arrhythmia    Cataract    surgery in right eye in 03/2022   Diverticulitis    Diverticulosis    GERD (gastroesophageal reflux disease)    Heart murmur    HTN (hypertension)    not on meds in 2 years per pt on 05/19/22 n   Infertility, female    Neuromuscular disorder (HCC)    idiopathic peripheral  neuropathy   Peripheral neuropathy    Positive PPD    SIADH (syndrome of inappropriate ADH  production) (HCC)     Past Surgical History:  Procedure Laterality Date   BREAST BIOPSY Left    BREAST EXCISIONAL BIOPSY Left    benign   CATARACT EXTRACTION Right 2023   CERVICAL BIOPSY  W/ LOOP ELECTRODE EXCISION     CERVICAL POLYPECTOMY  02/2016   CHOLECYSTECTOMY N/A 06/02/2022   Procedure: LAPAROSCOPIC CHOLECYSTECTOMY WITH INTRAOPERATIVE CHOLANGIOGRAM;  Surgeon: Darnell Level, MD;  Location: [facility];  Service: General;  Laterality: N/A;   COLONOSCOPY     COLPOSCOPY     DILATION AND CURETTAGE OF UTERUS     HYSTEROSCOPY     LOOP RECORDER IMPLANT     LUMBAR DISC SURGERY     LUMBAR LAMINECTOMY     TONSILLECTOMY     UPPER GASTROINTESTINAL ENDOSCOPY      Family History  Problem Relation Age of Onset   Breast cancer Mother    Glaucoma Mother    Miscarriages / Stillbirths Mother    Heart attack Father    Depression Father    Early death Father    Heart disease Father    Depression Brother    Hypertension Brother    Tuberculosis Paternal Grandmother    Prostate cancer Paternal Grandfather    Colon cancer Neg Hx    Esophageal cancer Neg Hx    Rectal cancer Neg Hx    Stomach cancer Neg Hx    Uterine cancer Neg Hx    Bladder Cancer Neg Hx     Social History   Socioeconomic History   Marital status: Married    Spouse name: John   Number of children: 0   Years of education: College   Highest education level: Not on file  Occupational History   Occupation: retired physician  Tobacco Use   Smoking status: Never   Smokeless tobacco: Never  Vaping Use   Vaping status: Never Used  Substance and Sexual Activity   Alcohol use: Yes    Alcohol/week: 7.0 standard drinks of alcohol    Types: 7 Glasses of wine per week    Comment: 102 glsses wine daily   Drug use: No   Sexual activity: Not Currently    Partners: Male    Birth control/protection: Post-menopausal  Other Topics Concern   Not on file  Social History Narrative   Lives at home with her husband.    Right-handed.   1-2 cups caffeine per day.   Retired Marine scientist   Social Drivers of Corporate investment banker Strain: Low Risk  (03/11/2022)   Overall Financial Resource Strain (CARDIA)    Difficulty of Paying Living Expenses: Not hard at all  Food Insecurity: No Food Insecurity (03/11/2022)   Hunger Vital Sign    Worried About Running Out of Food in the Last Year: Never true    Ran Out of Food in the Last Year: Never true  Transportation Needs: No Transportation Needs (03/11/2022)   PRAPARE -  Administrator, Civil Service (Medical): No    Lack of Transportation (Non-Medical): No  Physical Activity: Inactive (03/11/2022)   Exercise Vital Sign    Days of Exercise per Week: 0 days    Minutes of Exercise per Session: 0 min  Stress: Stress Concern Present (03/11/2022)   [employer] of Occupational Health - Occupational Stress Questionnaire    Feeling of Stress : To some extent  Social Connections: Moderately Isolated (03/11/2022)   Social Connection and Isolation Panel [NHANES]    Frequency of Communication with Friends and Family: Three times a week    Frequency of Social Gatherings with Friends and Family: Never    Attends Religious Services: Never    Database administrator or Organizations: No    Attends Banker Meetings: Never    Marital Status: Married  Catering manager Violence: Not At Risk (03/11/2022)   Humiliation, Afraid, Rape, and Kick questionnaire    Fear of Current or Ex-Partner: No    Emotionally Abused: No    Physically Abused: No    Sexually Abused: No    ROS Per HPI      Objective    BP 122/72 (BP Location: Left Arm, Patient Position: Sitting)   Pulse 83   Temp 98.7 F (37.1 C) (Temporal)   Ht 5' 4.76" (1.645 m)   Wt 193 lb (87.5 kg)   LMP  (LMP Unknown)   SpO2 99%   BMI 32.36 kg/m   Physical Exam Vitals and nursing note reviewed.  Constitutional:      General: She is not in acute distress.    Comments: Appears  fatigued  HENT:     Head: Normocephalic and atraumatic.     Nose: Congestion present.     Right Sinus: Frontal sinus tenderness present.     Left Sinus: Frontal sinus tenderness present.     Mouth/Throat:     Mouth: Mucous membranes are moist.     Pharynx: Oropharynx is clear. No oropharyngeal exudate or posterior oropharyngeal erythema.     Comments: Oropharyngeal cobblestoning   Eyes:     Extraocular Movements: Extraocular movements intact.  Cardiovascular:     Rate and Rhythm: Normal rate and regular rhythm.     Heart sounds: Normal heart sounds.  Pulmonary:     Effort: Pulmonary effort is normal. No respiratory distress.     Breath sounds: No wheezing, rhonchi or rales.  Musculoskeletal:        General: Normal range of motion.     Cervical back: Normal range of motion and neck supple.     Right lower leg: Edema present.     Left lower leg: Edema present.  Lymphadenopathy:     Cervical: Cervical adenopathy present.  Skin:    General: Skin is warm and dry.     Capillary Refill: Capillary refill takes less than 2 seconds.  Neurological:     General: No focal deficit present.     Mental Status: She is alert and oriented to person, place, and time.  Psychiatric:        Mood and Affect: Mood normal.        Behavior: Behavior normal.         Assessment & Plan:   Joint swelling of lower leg -     Furosemide; Take 1 tablet (20 mg total) by mouth daily.  Dispense: 30 tablet; Refill: 3  Pelvic floor dysfunction in female -     Ambulatory referral to  Physical Therapy  SIADH (syndrome of inappropriate ADH production) (HCC) Assessment & Plan: BMPs have been ordered monthly  Continue salt tabs   Orders: -     Basic metabolic panel; Future -     Basic metabolic panel; Future -     Basic metabolic panel; Future -     Basic metabolic panel; Future -     Basic metabolic panel; Future  Paroxysmal atrial fibrillation (HCC) Assessment & Plan: Continue current medication  regimen, follow-up cardiology as scheduled   Primary hypertension Assessment & Plan: Continue current medication regimen  Continue efforts and low-salt diet  Lasix sent to pharmacy   Post herpetic neuralgia -     Ambulatory referral to Neurology  Idiopathic peripheral neuropathy -     Ambulatory referral to Neurology  Prediabetes Assessment & Plan: Continue current medication regimen.  Continue efforts in low sugar, low-carb diet and healthy activity level   Depression, recurrent (HCC) Assessment & Plan: Continue current medication regimen, exercise as tolerated   Anxiety Assessment & Plan: Continue current medication regimen   Vitamin D deficiency Assessment & Plan: Continue vitamin D supplementation   Gastroesophageal reflux disease, unspecified whether esophagitis present Assessment & Plan: Continue current efforts and low acid diet, current medication regimen   Encounter for screening mammogram for malignant neoplasm of breast -     3D Screening Mammogram, Left and Right; Future  Will check BMPs monthly due to SIADH, due for other routine labs in about 6 months. Continue current medication regimen Referrals to pelvic floor PT as well as neurology have been made today Mammogram ordered  Return in about 6 months (around 06/11/2024) for meds, labs.   Moshe Cipro, FNP

## 2023-12-11 NOTE — Assessment & Plan Note (Signed)
 Continue current medication regimen  Continue efforts and low-salt diet  Lasix sent to pharmacy

## 2023-12-11 NOTE — Assessment & Plan Note (Signed)
 BMPs have been ordered monthly  Continue salt tabs

## 2023-12-11 NOTE — Assessment & Plan Note (Signed)
 Continue vitamin D supplementation

## 2023-12-14 ENCOUNTER — Other Ambulatory Visit: Payer: Self-pay | Admitting: Internal Medicine

## 2023-12-14 ENCOUNTER — Encounter: Payer: Self-pay | Admitting: Neurology

## 2023-12-21 ENCOUNTER — Encounter: Payer: Self-pay | Admitting: Obstetrics

## 2023-12-21 ENCOUNTER — Telehealth: Payer: Self-pay

## 2023-12-21 ENCOUNTER — Ambulatory Visit (INDEPENDENT_AMBULATORY_CARE_PROVIDER_SITE_OTHER)

## 2023-12-21 ENCOUNTER — Other Ambulatory Visit (HOSPITAL_COMMUNITY)
Admission: RE | Admit: 2023-12-21 | Discharge: 2023-12-21 | Disposition: A | Discharge status: Home / Self Care | Source: Other Acute Inpatient Hospital | Attending: Obstetrics | Admitting: Obstetrics

## 2023-12-21 VITALS — BP 144/69 | HR 75

## 2023-12-21 DIAGNOSIS — R300 Dysuria: Secondary | ICD-10-CM | POA: Diagnosis not present

## 2023-12-21 LAB — POCT URINALYSIS DIPSTICK
Bilirubin, UA: NEGATIVE
Blood, UA: NEGATIVE
Glucose, UA: NEGATIVE
Ketones, UA: NEGATIVE
Leukocytes, UA: NEGATIVE
Nitrite, UA: NEGATIVE
Protein, UA: NEGATIVE
Spec Grav, UA: <=1.005 — AB (ref 1.010–1.025)
Urobilinogen, UA: 0.2 U/dL
pH, UA: 5.5 (ref 5.0–8.0)

## 2023-12-21 NOTE — Patient Instructions (Signed)
 Please keep any scheduled follow ups.  It was a pleasure to see you today!  Thank you for trusting me with your care!

## 2023-12-21 NOTE — Telephone Encounter (Signed)
 Katrina Benitez is a 78 y.o. female called in to let dr Olena Leatherwood know she has a UTI, pt was encouraged to come in for  urine sample. Pt was hesitant bur schedule to some in at 3:20pm

## 2023-12-21 NOTE — Progress Notes (Signed)
 Katrina Benitez arrived today with dysuria, hematuria, and urinary frequency. Patient is notexperiencing fever, unstable vitals and/or one-sided back flank pain. Patient has not had had a recent hospitalization due to UTI.  Last visit in the office was 12/21/2023.  Per protocol:   The most recent Urinalysis completed on 12-21-2023 and was normal.  Last Creatinine level  Lab Results  Component Value Date   CREATININE 1.09 11/27/2023    An urine specimen was collected and POCT urinalysis completed.  [] A cath specimen was collected due to patient's current condition, symptoms or post-procedural state.  Total urine output by catheter is  Output by Drain (mL) 12/19/23 0701 - 12/19/23 1900 12/19/23 1901 - 12/20/23 0700 12/20/23 0701 - 12/20/23 1900 12/20/23 1901 - 12/21/23 0700 12/21/23 0701 - 12/21/23 1533  Patient has no LDAs of requested type attached.    Marland Kitchen    POCT Urine results is normal.  Urine micro was not sent per protocol for abnormal urinalysis.  Urine culture was sent per protocol for abnormal urinalysis.     [] Pt was notified of positive urine results and plan for additional urine testing. We will contact you within the next 3-4 days with these results.  [x] No Prescription was sent to your pharmacy.  The additional testing will indicate if a prescription is needed.   [] Patient was notified of abnormal urine results. The following prescription is sent to your preferred pharmacy.  []  Macrobid 100mg  #10 1 tablet by mouth twice daily with food for 5 days      []  Bactrim DS 800-160mg  #6 1 tablet by mouth twice daily for 3 days        []  Due to your current medication allergies, an alternate prescription was discussed with your provider and will be prescribed and sent to your pharmacy.  [] You can take over the counter AZO two tablets up to three times a day for two days.  Take AZO tablets with a full glass of water. AZO will turn your urine orange, this is normal.   [x] The patient was notified of  negative urine results.  If symptoms persist, you may take over the counter AZO two tablets up to three times a day for two days.  AZO will turn your urine orange, this is normal.  Contact the office back to schedule an appointment if your symptoms persist or worsen or you develop additional symptoms.       CC'd note to patient's provider.

## 2023-12-22 ENCOUNTER — Other Ambulatory Visit: Payer: Self-pay | Admitting: Obstetrics

## 2023-12-22 ENCOUNTER — Ambulatory Visit

## 2023-12-22 DIAGNOSIS — R300 Dysuria: Secondary | ICD-10-CM

## 2023-12-22 DIAGNOSIS — Z87440 Personal history of urinary (tract) infections: Secondary | ICD-10-CM

## 2023-12-22 MED ORDER — PHENAZOPYRIDINE HCL 200 MG PO TABS: 200.0000 mg | ORAL_TABLET | Freq: Three times a day (TID) | ORAL | 0 refills | Status: DC | PRN

## 2023-12-22 MED ORDER — NITROFURANTOIN MONOHYD MACRO 100 MG PO CAPS: 100.0000 mg | ORAL_CAPSULE | Freq: Two times a day (BID) | ORAL | 0 refills | Status: DC

## 2023-12-23 ENCOUNTER — Other Ambulatory Visit (HOSPITAL_COMMUNITY): Payer: Self-pay | Admitting: Obstetrics

## 2023-12-23 ENCOUNTER — Telehealth (HOSPITAL_BASED_OUTPATIENT_CLINIC_OR_DEPARTMENT_OTHER): Payer: Self-pay | Admitting: *Deleted

## 2023-12-23 ENCOUNTER — Encounter: Payer: Self-pay | Admitting: Obstetrics

## 2023-12-23 ENCOUNTER — Other Ambulatory Visit: Payer: Self-pay | Admitting: Family Medicine

## 2023-12-23 DIAGNOSIS — R102 Pelvic and perineal pain: Secondary | ICD-10-CM

## 2023-12-23 DIAGNOSIS — Z87440 Personal history of urinary (tract) infections: Secondary | ICD-10-CM

## 2023-12-23 LAB — URINE CULTURE: Culture: 10000 — AB

## 2023-12-23 MED ORDER — CEPHALEXIN 250 MG PO CAPS: 250.0000 mg | ORAL_CAPSULE | Freq: Every day | ORAL | 2 refills | Status: DC

## 2023-12-23 NOTE — Telephone Encounter (Signed)
 TC from pt.  She never completed the [place] from July 2024 that Dr Hyacinth Meeker ordered.  Pelvic complete with transvaginal.  New order placed.  KD

## 2023-12-25 ENCOUNTER — Telehealth: Payer: Self-pay

## 2023-12-25 MED ORDER — CEPHALEXIN 500 MG PO CAPS: 500.0000 mg | ORAL_CAPSULE | Freq: Four times a day (QID) | ORAL | 0 refills | Status: AC

## 2023-12-25 MED ORDER — URIBEL 118 MG PO CAPS: 1.0000 | ORAL_CAPSULE | Freq: Four times a day (QID) | ORAL | 2 refills | Status: DC | PRN

## 2023-12-25 NOTE — Telephone Encounter (Signed)
 Patient called stating her symptoms are not resolving and she want to know if she can take Uribel for relief.

## 2023-12-25 NOTE — Telephone Encounter (Signed)
 Spoke on the phone with patient regarding symptoms.  Denies relief after Azo or macrobid since 12/22/23. Continues to report internal burning sensation and urinary frequency.  Denies fever, chills, N/V, one sided back pain. Denies sensation of incomplete emptying.  Desires to use Uribel due to symptoms. Rx Uribel for use up to 1 tab 4x/day.   Please call if you continue to experience persistent or worsening urinary symptoms such as fever > 100.4, nausea/vomiting, one sided back pain or blood in your urine.   If you continue to experience vaginal or urinary symptoms. Please return to the office for vaginal testing to rule out vaginal infection.  Continue topical lidocaine use 0.5g up to 3x/day as needed for discomfort.   Changed prescription to Keflex 500mg  4 times a day by mouth to assess change in urinary symptoms.   You can try topical barrier ointment such as Vaseline, vaginal moisturizers (Replens, Revaree), or Vitamin E suppository for your symptoms.

## 2023-12-28 ENCOUNTER — Other Ambulatory Visit: Payer: Self-pay

## 2023-12-28 ENCOUNTER — Ambulatory Visit (INDEPENDENT_AMBULATORY_CARE_PROVIDER_SITE_OTHER): Payer: Medicare Other

## 2023-12-28 ENCOUNTER — Ambulatory Visit: Attending: Obstetrics | Admitting: Physical Therapy

## 2023-12-28 DIAGNOSIS — R279 Unspecified lack of coordination: Secondary | ICD-10-CM | POA: Diagnosis not present

## 2023-12-28 DIAGNOSIS — R293 Abnormal posture: Secondary | ICD-10-CM | POA: Insufficient documentation

## 2023-12-28 DIAGNOSIS — M62838 Other muscle spasm: Secondary | ICD-10-CM | POA: Insufficient documentation

## 2023-12-28 DIAGNOSIS — M6281 Muscle weakness (generalized): Secondary | ICD-10-CM | POA: Diagnosis not present

## 2023-12-28 DIAGNOSIS — I480 Paroxysmal atrial fibrillation: Secondary | ICD-10-CM

## 2023-12-28 NOTE — Addendum Note (Signed)
 Addended by: Geralyn Flash D on: 12/28/2023 01:20 PM   Modules accepted: Orders

## 2023-12-28 NOTE — Patient Instructions (Signed)
 5 mins 2x day (am/pm) gentle pressure shouldn't painful.

## 2023-12-28 NOTE — Progress Notes (Signed)
 Carelink Summary Report / Loop Recorder

## 2023-12-28 NOTE — Therapy (Signed)
 OUTPATIENT PHYSICAL THERAPY FEMALE PELVIC EVALUATION   Patient Name: Katrina Benitez MRN: [id number] DOB:July 30, 1946, 78 y.o., female Today's Date: 12/28/2023  END OF SESSION:  PT End of Session - 12/28/23 1206     Visit Number 1    Date for PT Re-Evaluation 03/28/24    Authorization Type MCR    PT Start Time 1202   arrival   PT Stop Time 1236    PT Time Calculation (min) 34 min    Activity Tolerance Patient tolerated treatment well    Behavior During Therapy [facility] for tasks assessed/performed             Past Medical History:  Diagnosis Date   Allergy    Anemia    Anxiety    Arthritis    degenerative disc disorder   Cancer (HCC)    skin cancer   Cardiac arrhythmia    Cataract    surgery in right eye in 03/2022   Diverticulitis    Diverticulosis    GERD (gastroesophageal reflux disease)    Heart murmur    HTN (hypertension)    not on meds in 2 years per pt on 05/19/22 n   Infertility, female    Neuromuscular disorder (HCC)    idiopathic peripheral  neuropathy   Peripheral neuropathy    Positive PPD    SIADH (syndrome of inappropriate ADH production) (HCC)    Past Surgical History:  Procedure Laterality Date   BREAST BIOPSY Left    BREAST EXCISIONAL BIOPSY Left    benign   CATARACT EXTRACTION Right 2023   CERVICAL BIOPSY  W/ LOOP ELECTRODE EXCISION     CERVICAL POLYPECTOMY  02/2016   CHOLECYSTECTOMY N/A 06/02/2022   Procedure: LAPAROSCOPIC CHOLECYSTECTOMY WITH INTRAOPERATIVE CHOLANGIOGRAM;  Surgeon: Darnell Level, MD;  Location: [facility];  Service: General;  Laterality: N/A;   COLONOSCOPY     COLPOSCOPY     DILATION AND CURETTAGE OF UTERUS     HYSTEROSCOPY     LOOP RECORDER IMPLANT     LUMBAR DISC SURGERY     LUMBAR LAMINECTOMY     TONSILLECTOMY     UPPER GASTROINTESTINAL ENDOSCOPY     Patient Active Problem List   Diagnosis Date Noted   Feeling of incomplete bladder emptying 12/10/2023   Urge urinary incontinence 12/10/2023   History of recurrent UTI  (urinary tract infection) 12/10/2023   Vaginal atrophy 12/10/2023   Constipation 12/10/2023   Prediabetes 07/30/2023   High risk medication use 01/08/2023   Abdominal pain, right upper quadrant 05/18/2022   Symptomatic cholelithiasis 05/18/2022   Syncope and collapse 03/05/2022   Paroxysmal atrial fibrillation (HCC) 01/27/2022   Increased risk of breast cancer 04/18/2021   Anemia 01/09/2021   Pruritus 11/14/2019   Depression, recurrent (HCC) 02/18/2019   Primary insomnia 02/18/2019   Atrial fibrillation with RVR (HCC) 01/19/2019   Hyperlipidemia 09/30/2018   Post herpetic neuralgia 08/12/2018   Idiopathic peripheral neuropathy 08/12/2018   Vitamin D deficiency 07/22/2018   Vulvar pruritus 04/16/2016   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   Opacity noted on imaging study    Chest pain 01/21/2016   SIADH (syndrome of inappropriate ADH production) (HCC) 01/21/2016   Hypertension 01/21/2016   Anxiety 01/21/2016   GERD (gastroesophageal reflux disease) 01/21/2016   Gait abnormality 12/12/2015    PCP: Sherald Barge, FNP  REFERRING PROVIDER: Sherald Barge, FNP  REFERRING DIAG: [phone] (ICD-10-CM) - Pelvic floor dysfunction in female  THERAPY DIAG:  Muscle weakness (generalized) - Plan: PT plan of  care cert/re-cert  Unspecified lack of coordination - Plan: PT plan of care cert/re-cert  Abnormal posture - Plan: PT plan of care cert/re-cert  Other muscle spasm - Plan: PT plan of care cert/re-cert  Rationale for Evaluation and Treatment: Rehabilitation  ONSET DATE: 3 years  SUBJECTIVE:                                                                                                                                                                                           SUBJECTIVE STATEMENT: Has intense burning with urge to go to the bathroom and after emptying bladder internally; feels like she always has a UTI; emptying bladder does not help pain often this feels worse. Does  feel like she was needing to valsalva to empty bladder.  Does have full loss of bladder with urge and did not make it in time.     PAIN:  Are you having pain? Yes NPRS scale: 10/10 Pain location: Internal and Vaginal  Pain type: burning Pain description: constant - with urge and urination   Aggravating factors: urge to urinate, urinating, and after urinating  Relieving factors: just long period of time after emptying   PRECAUTIONS: None  RED FLAGS: None   WEIGHT BEARING RESTRICTIONS: No  FALLS:  Has patient fallen in last 6 months? No  OCCUPATION: retired - physician   ACTIVITY LEVEL : lower   PLOF: Independent  PATIENT GOALS: to have less leakage  PERTINENT HISTORY:  Vulvodynia, Vulvar pruritus, Anxiety Sexual abuse: No  BOWEL MOVEMENT: Pain with bowel movement: Yes Type of bowel movement:Type (Bristol Stool Scale) vary, Frequency every day, and Strain yes Fully empty rectum: No Leakage: No Pads: No Fiber supplement/laxative miralax   URINATION: Pain with urination: Yes Fully empty bladder: No Stream: Strong and Weak Urgency: Yes  Frequency: sometimes every two hours; 1-3x nightly Leakage: Urge to void and Walking to the bathroom Pads: No  INTERCOURSE:  Ability to have vaginal penetration Yes  Pain with intercourse: none DrynessYes  Climax: not active   PREGNANCY: Vaginal deliveries 0  C-section deliveries 0   PROLAPSE: None   OBJECTIVE:  Note: Objective measures were completed at Evaluation unless otherwise noted.  DIAGNOSTIC FINDINGS:    COGNITION: Overall cognitive status: Within functional limits for tasks assessed     SENSATION: Light touch: Appears intact   FUNCTIONAL TESTS:  Functional squat - descent limited by 25%  GAIT: WFL  POSTURE: rounded shoulders, forward head, and posterior pelvic tilt   LUMBARAROM/PROM:  A/PROM A/PROM  eval  Flexion WFL  Extension WFL  Right lateral flexion Limited by 25%  Left  lateral flexion Limited  by 25%  Right rotation Limited by 25%  Left rotation Limited by 25%   (Blank rows = not tested)  LOWER EXTREMITY ROM:  Bil hamstrings and adductors limited by 25%  LOWER EXTREMITY MMT:  Bil hips grossly 4/5; knees 4+/5 PALPATION:   General: tightness at bil lumbar paraspinals   Pelvic Alignment: WFL  Abdominal: tightness noted in abdominal lower quadrants but no TTP                External Perineal Exam: dryness noted at vulva, age related changes noted.                              Internal Pelvic Floor: mild TTP at bil obturators, tightness noted throughout deep and superficial layers  Patient confirms identification and approves PT to assess internal pelvic floor and treatment Yes No emotional/communication barriers or cognitive limitation. Patient is motivated to learn. Patient understands and agrees with treatment goals and plan. PT explains patient will be examined in standing, sitting, and lying down to see how their muscles and joints work. When they are ready, they will be asked to remove their underwear so PT can examine their perineum. The patient is also given the option of providing their own chaperone as one is not provided in our facility. The patient also has the right and is explained the right to defer or refuse any part of the evaluation or treatment including the internal exam. With the patient's consent, PT will use one gloved finger to gently assess the muscles of the pelvic floor, seeing how well it contracts and relaxes and if there is muscle symmetry. After, the patient will get dressed and PT and patient will discuss exam findings and plan of care. PT and patient discuss plan of care, schedule, attendance policy and HEP activities.  PELVIC MMT:   MMT eval  Vaginal 3/5 (but tightness throughout and decreased ability to relax pelvic floor)  Internal Anal Sphincter   External Anal Sphincter   Puborectalis   Diastasis Recti   (Blank rows  = not tested)        TONE: Increased throughout   PROLAPSE: Not seen with rectal assessment   TODAY'S TREATMENT:                                                                                                                              DATE:   12/28/23 EVAL Examination completed, findings reviewed, pt educated on POC, HEP, and fiber types. Pt motivated to participate in PT and agreeable to attempt recommendations.     PATIENT EDUCATION:  Education details: [id number], fiber types Person educated: Patient Education method: Explanation, Demonstration, Tactile cues, Verbal cues, and Handouts Education comprehension: verbalized understanding, returned demonstration, verbal cues required, tactile cues required, and needs further education  HOME EXERCISE PROGRAM: [id number]  ASSESSMENT:  CLINICAL IMPRESSION: Patient is a 78 y.o. female  who was  seen today for physical therapy evaluation and treatment for pelvic floor tension, urinary burning and pain, and constipation. Pt also reports urinary incontinence and fecal leakage. Pt demonstrates decreased flexibility at spine and hips, decreased core and hips, and restrictions in abdominal quadrants. Patient consented to internal pelvic floor assessment vaginally this date and found to have increased tension in superficial and deep pelvic floor layers, mild TTP in bil obturator internus. Pt also benefited from max cues for breathing coordination and unable to bulge downward. Pt would benefit from additional PT to further address deficits.    OBJECTIVE IMPAIRMENTS: decreased activity tolerance, decreased coordination, decreased knowledge of condition, decreased mobility, decreased strength, increased fascial restrictions, increased muscle spasms, impaired flexibility, improper body mechanics, and postural dysfunction.   ACTIVITY LIMITATIONS: continence and locomotion level  PARTICIPATION LIMITATIONS: community activity  PERSONAL FACTORS: Time  since onset of injury/illness/exacerbation are also affecting patient's functional outcome.   REHAB POTENTIAL: Good  CLINICAL DECISION MAKING: Stable/uncomplicated  EVALUATION COMPLEXITY: Low   GOALS: Goals reviewed with patient? Yes  SHORT TERM GOALS: Target date: 01/25/23  Pt to be I with HEP.  Baseline: Goal status: INITIAL  2.  Pt to be I with relaxation techniques for decreased strain at pelvic floor. Baseline:  Goal status: INITIAL  3.  Pt will be independent with use of squatty potty, relaxed toileting mechanics, and improved bowel movement techniques in order to increase ease of bowel movements and complete evacuation.   Baseline:  Goal status: INITIAL  4.  Pt will be able to teach back and utilize urge suppression technique in order to help reduce number of trips to the bathroom.    Baseline:  Goal status: INITIAL  LONG TERM GOALS: Target date: 03/28/24  Pt to be I with advanced HEP.  Baseline:  Goal status: INITIAL  2.  Pt to report no more than 1 instance of urinary incontinence in a week due to improved pelvic floor tissue mobility and I with urge suppression techniques.  Baseline:  Goal status: INITIAL  3.  Pt to report no fecal leakage in at least 1 month due to improved evacuation habits.  Baseline:  Goal status: INITIAL  4.  Pt to demonstrate no restrictions at lumbar spine and bil hips for decreased strain at pelvic floor.  Baseline:  Goal status: INITIAL  5.  Pt to demonstrate at least 4/5 pelvic floor strength and ability to fully relax after each contraction for improved pelvic stability and decreased strain at pelvic floor/ decrease leakage.  Baseline:  Goal status: INITIAL  6.  Pt to demonstrate improved coordination of pelvic floor and breathing mechanics with 10# squat with appropriate synergistic patterns to decrease pain and leakage at least 75% of the time.    Baseline:  Goal status: INITIAL  PLAN:  PT FREQUENCY: 1-2x/week  PT  DURATION:  15 sessions  PLANNED INTERVENTIONS: 97110-Therapeutic exercises, 97530- Therapeutic activity, 97112- Neuromuscular re-education, 97535- Self Care, 96295- Manual therapy, Patient/Family education, Taping, Dry Needling, Joint mobilization, Spinal mobilization, Scar mobilization, DME instructions, Cryotherapy, Moist heat, and Biofeedback  PLAN FOR NEXT SESSION: internal as needed, hip and trunk stretches, relaxation techniques    Otelia Sergeant, PT, DPT 04/07/259:01 PM

## 2023-12-29 ENCOUNTER — Ambulatory Visit: Admitting: Physical Therapy

## 2023-12-29 LAB — CUP PACEART REMOTE DEVICE CHECK
Date Time Interrogation Session: 20250406231208
Implantable Pulse Generator Implant Date: 20230614

## 2023-12-30 ENCOUNTER — Ambulatory Visit

## 2023-12-30 ENCOUNTER — Other Ambulatory Visit: Payer: Self-pay | Admitting: Obstetrics

## 2023-12-30 ENCOUNTER — Encounter: Payer: Self-pay | Admitting: Internal Medicine

## 2023-12-30 DIAGNOSIS — N94819 Vulvodynia, unspecified: Secondary | ICD-10-CM

## 2023-12-30 MED ORDER — LIDOCAINE 4 % EX CREA: TOPICAL_CREAM | CUTANEOUS | 0 refills | Status: AC

## 2023-12-30 NOTE — Progress Notes (Signed)
 Rx changed to lidocaine 4% cream due to insurance

## 2023-12-31 ENCOUNTER — Other Ambulatory Visit: Payer: Medicare Other

## 2024-01-01 ENCOUNTER — Other Ambulatory Visit: Payer: Medicare Other

## 2024-01-06 ENCOUNTER — Other Ambulatory Visit (INDEPENDENT_AMBULATORY_CARE_PROVIDER_SITE_OTHER)

## 2024-01-06 DIAGNOSIS — E222 Syndrome of inappropriate secretion of antidiuretic hormone: Secondary | ICD-10-CM | POA: Diagnosis not present

## 2024-01-07 ENCOUNTER — Other Ambulatory Visit: Payer: Self-pay | Admitting: Family Medicine

## 2024-01-07 ENCOUNTER — Telehealth: Payer: Self-pay | Admitting: Family Medicine

## 2024-01-07 LAB — BASIC METABOLIC PANEL WITH GFR
BUN: 25 mg/dL — ABNORMAL HIGH (ref 6–23)
CO2: 25 meq/L (ref 19–32)
Calcium: 8.6 mg/dL (ref 8.4–10.5)
Chloride: 99 meq/L (ref 96–112)
Creatinine, Ser: 1.03 mg/dL (ref 0.40–1.20)
GFR: 52.38 mL/min — ABNORMAL LOW (ref >60.00)
Glucose, Bld: 93 mg/dL (ref 70–99)
Potassium: 5 meq/L (ref 3.5–5.1)
Sodium: 134 meq/L — ABNORMAL LOW (ref 135–145)

## 2024-01-07 NOTE — Telephone Encounter (Signed)
 [facility] called and spoke to Katrina Benitez, Katrina Benitez about the refill(s) gabapentin requested. Advised it was sent on 10/01/23 #270/1 refill(s). She says the computer made it seem like there were no refills left when the patient has a partial refill remaining of 240, so they will refill that for the patient. She says the patient picked up only 30 pills on 10/02/23 then came back 10 days later for 270, so she apologized and said everything is good.  Copied from CRM [id number]. Topic: Clinical - Prescription Issue >> Jan 07, 2024  4:21 PM Katrina Benitez wrote: Reason for CRM: Katrina Benitez called regarding the denial of Katrina Benitez gabapentin (NEURONTIN) 600 MG tablet, stated that the reason for denial states "Change not appropriate". Pharmacist states that there are no changes with the prescription and is was last filled on 01/17 for a 3 month supply. Patient requested refill today 04/17, pharmacy looking for clarification on denial reason.  Call Back number: [phone]

## 2024-01-11 ENCOUNTER — Ambulatory Visit
Admission: RE | Admit: 2024-01-11 | Discharge: 2024-01-11 | Discharge status: Home / Self Care | Source: Ambulatory Visit | Attending: Family Medicine

## 2024-01-11 ENCOUNTER — Ambulatory Visit: Admitting: Physical Therapy

## 2024-01-11 DIAGNOSIS — R279 Unspecified lack of coordination: Secondary | ICD-10-CM | POA: Diagnosis not present

## 2024-01-11 DIAGNOSIS — M6281 Muscle weakness (generalized): Secondary | ICD-10-CM

## 2024-01-11 DIAGNOSIS — R293 Abnormal posture: Secondary | ICD-10-CM | POA: Diagnosis not present

## 2024-01-11 DIAGNOSIS — M62838 Other muscle spasm: Secondary | ICD-10-CM | POA: Diagnosis not present

## 2024-01-11 DIAGNOSIS — Z1231 Encounter for screening mammogram for malignant neoplasm of breast: Secondary | ICD-10-CM | POA: Diagnosis not present

## 2024-01-11 NOTE — Patient Instructions (Signed)
**Note De-identified Burback Obfuscation** Types of Fiber  There are two main types of fiber:  insoluble and soluble.  Both of these types can prevent and relieve constipation and diarrhea, although some people find one or the other to be more easily digested.  This handout details information about both types of fiber. recommended 25-35 grams of fiber per day,  average 9-12 grams per meal   key is a balance between soluble and insoluble  Insoluble Fiber        Functions of Insoluble Fiber moves bulk through the intestines  controls and balances the pH (acidity) in the intestines   This type of fiber should be avoided or reduced if you have soft, frequent bowel movements or leakage      Benefits of Insoluble Fiber promotes regular bowel movement and prevents constipation  removes fecal waste through colon in less time  keeps an optimal pH in intestines to prevent microbes from producing cancer substances, therefore preventing colon cancer        Food Sources of Insoluble Fiber whole-wheat products  wheat bran "miller's bran" corn bran  flax seed or other seeds vegetables such as green beans, broccoli, cauliflower and potato skins  fruit skins and root vegetable skins  popcorn brown rice  Soluble Fiber( Types 5,6,7)       Functions of Soluble Fiber  holds water in the colon to bulk and soften the stool prolongs stomach emptying time so that sugar is released and absorbed more slowly  prevent leakage associated with soft, frequent bowel movements.        Benefits of Soluble Fiber lowers total cholesterol and LDL cholesterol (the bad cholesterol) therefore reducing the risk of heart disease  regulates blood sugar for people with diabetes       Food Sources of Soluble Fiber oat/oat bran dried beans and peas  nuts  barley  flax seed or other seeds fruits such as oranges, pears, peaches, and apples  vegetables such as carrots  psyllium husk  prunes  

## 2024-01-11 NOTE — Therapy (Signed)
 OUTPATIENT PHYSICAL THERAPY FEMALE PELVIC TREATMENT   Patient Name: Katrina Benitez MRN: [id number] DOB:11-26-1945, 78 y.o., female Today's Date: 01/11/2024  END OF SESSION:  PT End of Session - 01/11/24 1259     Visit Number 2    Date for PT Re-Evaluation 03/28/24    Authorization Type MCR    PT Start Time 1300   arrival time   PT Stop Time 1312    PT Time Calculation (min) 12 min    Activity Tolerance Patient tolerated treatment well    Behavior During Therapy WFL for tasks assessed/performed             Past Medical History:  Diagnosis Date   Allergy    Anemia    Anxiety    Arthritis    degenerative disc disorder   Cancer (HCC)    skin cancer   Cardiac arrhythmia    Cataract    surgery in right eye in 03/2022   Diverticulitis    Diverticulosis    GERD (gastroesophageal reflux disease)    Heart murmur    HTN (hypertension)    not on meds in 2 years per pt on 05/19/22 n   Infertility, female    Neuromuscular disorder (HCC)    idiopathic peripheral  neuropathy   Peripheral neuropathy    Positive PPD    SIADH (syndrome of inappropriate ADH production) (HCC)    Past Surgical History:  Procedure Laterality Date   BREAST BIOPSY Left    BREAST EXCISIONAL BIOPSY Left    benign   CATARACT EXTRACTION Right 2023   CERVICAL BIOPSY  W/ LOOP ELECTRODE EXCISION     CERVICAL POLYPECTOMY  02/2016   CHOLECYSTECTOMY N/A 06/02/2022   Procedure: LAPAROSCOPIC CHOLECYSTECTOMY WITH INTRAOPERATIVE CHOLANGIOGRAM;  Surgeon: Oralee Billow, MD;  Location: [facility];  Service: General;  Laterality: N/A;   COLONOSCOPY     COLPOSCOPY     DILATION AND CURETTAGE OF UTERUS     HYSTEROSCOPY     LOOP RECORDER IMPLANT     LUMBAR DISC SURGERY     LUMBAR LAMINECTOMY     TONSILLECTOMY     UPPER GASTROINTESTINAL ENDOSCOPY     Patient Active Problem List   Diagnosis Date Noted   Feeling of incomplete bladder emptying 12/10/2023   Urge urinary incontinence 12/10/2023   History of recurrent  UTI (urinary tract infection) 12/10/2023   Vaginal atrophy 12/10/2023   Constipation 12/10/2023   Prediabetes 07/30/2023   High risk medication use 01/08/2023   Abdominal pain, right upper quadrant 05/18/2022   Symptomatic cholelithiasis 05/18/2022   Syncope and collapse 03/05/2022   Paroxysmal atrial fibrillation (HCC) 01/27/2022   Increased risk of breast cancer 04/18/2021   Anemia 01/09/2021   Pruritus 11/14/2019   Depression, recurrent (HCC) 02/18/2019   Primary insomnia 02/18/2019   Atrial fibrillation with RVR (HCC) 01/19/2019   Hyperlipidemia 09/30/2018   Post herpetic neuralgia 08/12/2018   Idiopathic peripheral neuropathy 08/12/2018   Vitamin D  deficiency 07/22/2018   Vulvar pruritus 04/16/2016   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   Opacity noted on imaging study    Chest pain 01/21/2016   SIADH (syndrome of inappropriate ADH production) (HCC) 01/21/2016   Hypertension 01/21/2016   Anxiety 01/21/2016   GERD (gastroesophageal reflux disease) 01/21/2016   Gait abnormality 12/12/2015    PCP: Wellington Half, FNP  REFERRING PROVIDER: Wellington Half, FNP  REFERRING DIAG: [phone] (ICD-10-CM) - Pelvic floor dysfunction in female  THERAPY DIAG:  Muscle weakness (generalized)  Unspecified lack of  coordination  Rationale for Evaluation and Treatment: Rehabilitation  ONSET DATE: 3 years  SUBJECTIVE:                                                                                                                                                                                           SUBJECTIVE STATEMENT: Reports urine symptoms are improving - less burning and less leakage Was late to appointment today with being in the bathroom a lot and unable to leave house earlier than she did.     PAIN:  Are you having pain? Yes NPRS scale: 10/10 Pain location: Internal and Vaginal  Pain type: burning Pain description: constant - with urge and urination   Aggravating  factors: urge to urinate, urinating, and after urinating  Relieving factors: just long period of time after emptying   PRECAUTIONS: None  RED FLAGS: None   WEIGHT BEARING RESTRICTIONS: No  FALLS:  Has patient fallen in last 6 months? No  OCCUPATION: retired - physician   ACTIVITY LEVEL : lower   PLOF: Independent  PATIENT GOALS: to have less leakage  PERTINENT HISTORY:  Vulvodynia, Vulvar pruritus, Anxiety Sexual abuse: No  BOWEL MOVEMENT: Pain with bowel movement: Yes Type of bowel movement:Type (Bristol Stool Scale) vary, Frequency every day, and Strain yes Fully empty rectum: No Leakage: No Pads: No Fiber supplement/laxative miralax   URINATION: Pain with urination: Yes Fully empty bladder: No Stream: Strong and Weak Urgency: Yes  Frequency: sometimes every two hours; 1-3x nightly Leakage: Urge to void and Walking to the bathroom Pads: No  INTERCOURSE:  Ability to have vaginal penetration Yes  Pain with intercourse: none DrynessYes  Climax: not active   PREGNANCY: Vaginal deliveries 0  C-section deliveries 0   PROLAPSE: None   OBJECTIVE:  Note: Objective measures were completed at Evaluation unless otherwise noted.  DIAGNOSTIC FINDINGS:    COGNITION: Overall cognitive status: Within functional limits for tasks assessed     SENSATION: Light touch: Appears intact   FUNCTIONAL TESTS:  Functional squat - descent limited by 25%  GAIT: WFL  POSTURE: rounded shoulders, forward head, and posterior pelvic tilt   LUMBARAROM/PROM:  A/PROM A/PROM  eval  Flexion WFL  Extension WFL  Right lateral flexion Limited by 25%  Left lateral flexion Limited by 25%  Right rotation Limited by 25%  Left rotation Limited by 25%   (Blank rows = not tested)  LOWER EXTREMITY ROM:  Bil hamstrings and adductors limited by 25%  LOWER EXTREMITY MMT:  Bil hips grossly 4/5; knees 4+/5 PALPATION:   General: tightness at bil lumbar paraspinals    Pelvic Alignment: WFL  Abdominal: tightness  noted in abdominal lower quadrants but no TTP                External Perineal Exam: dryness noted at vulva, age related changes noted.                              Internal Pelvic Floor: mild TTP at bil obturators, tightness noted throughout deep and superficial layers  Patient confirms identification and approves PT to assess internal pelvic floor and treatment Yes No emotional/communication barriers or cognitive limitation. Patient is motivated to learn. Patient understands and agrees with treatment goals and plan. PT explains patient will be examined in standing, sitting, and lying down to see how their muscles and joints work. When they are ready, they will be asked to remove their underwear so PT can examine their perineum. The patient is also given the option of providing their own chaperone as one is not provided in our facility. The patient also has the right and is explained the right to defer or refuse any part of the evaluation or treatment including the internal exam. With the patient's consent, PT will use one gloved finger to gently assess the muscles of the pelvic floor, seeing how well it contracts and relaxes and if there is muscle symmetry. After, the patient will get dressed and PT and patient will discuss exam findings and plan of care. PT and patient discuss plan of care, schedule, attendance policy and HEP activities.  PELVIC MMT:   MMT eval  Vaginal 3/5 (but tightness throughout and decreased ability to relax pelvic floor)  Internal Anal Sphincter   External Anal Sphincter   Puborectalis   Diastasis Recti   (Blank rows = not tested)        TONE: Increased throughout   PROLAPSE: Not seen with rectal assessment   TODAY'S TREATMENT:                                                                                                                              DATE:   12/28/23 EVAL Examination completed, findings reviewed,  pt educated on POC, HEP, and fiber types. Pt motivated to participate in PT and agreeable to attempt recommendations.    4/21: Pt educated on voiding mechanics, balloon breathing technique to decreased straining for bowel movements, and relaxation techniques to decreased tension at pelvic floor for improved emptying of bladder and bowels.    PATIENT EDUCATION:  Education details: [id number], fiber types Person educated: Patient Education method: Explanation, Demonstration, Tactile cues, Verbal cues, and Handouts Education comprehension: verbalized understanding, returned demonstration, verbal cues required, tactile cues required, and needs further education  HOME EXERCISE PROGRAM: [id number]  ASSESSMENT:  CLINICAL IMPRESSION: Patient is a 78 y.o. female  who was seen today for physical therapy evaluation and treatment for pelvic floor tension, urinary burning and pain, and constipation. Pt also reports urinary incontinence and fecal leakage. Pt session  very limited due to pt arriving late to appointment, however all pt's questions answered about voiding mechanics and relaxation for bowel and bladder emptying.  Pt would benefit from additional PT to further address deficits.    OBJECTIVE IMPAIRMENTS: decreased activity tolerance, decreased coordination, decreased knowledge of condition, decreased mobility, decreased strength, increased fascial restrictions, increased muscle spasms, impaired flexibility, improper body mechanics, and postural dysfunction.   ACTIVITY LIMITATIONS: continence and locomotion level  PARTICIPATION LIMITATIONS: community activity  PERSONAL FACTORS: Time since onset of injury/illness/exacerbation are also affecting patient's functional outcome.   REHAB POTENTIAL: Good  CLINICAL DECISION MAKING: Stable/uncomplicated  EVALUATION COMPLEXITY: Low   GOALS: Goals reviewed with patient? Yes  SHORT TERM GOALS: Target date: 01/25/23  Pt to be I with HEP.   Baseline: Goal status: INITIAL  2.  Pt to be I with relaxation techniques for decreased strain at pelvic floor. Baseline:  Goal status: INITIAL  3.  Pt will be independent with use of squatty potty, relaxed toileting mechanics, and improved bowel movement techniques in order to increase ease of bowel movements and complete evacuation.   Baseline:  Goal status: INITIAL  4.  Pt will be able to teach back and utilize urge suppression technique in order to help reduce number of trips to the bathroom.    Baseline:  Goal status: INITIAL  LONG TERM GOALS: Target date: 03/28/24  Pt to be I with advanced HEP.  Baseline:  Goal status: INITIAL  2.  Pt to report no more than 1 instance of urinary incontinence in a week due to improved pelvic floor tissue mobility and I with urge suppression techniques.  Baseline:  Goal status: INITIAL  3.  Pt to report no fecal leakage in at least 1 month due to improved evacuation habits.  Baseline:  Goal status: INITIAL  4.  Pt to demonstrate no restrictions at lumbar spine and bil hips for decreased strain at pelvic floor.  Baseline:  Goal status: INITIAL  5.  Pt to demonstrate at least 4/5 pelvic floor strength and ability to fully relax after each contraction for improved pelvic stability and decreased strain at pelvic floor/ decrease leakage.  Baseline:  Goal status: INITIAL  6.  Pt to demonstrate improved coordination of pelvic floor and breathing mechanics with 10# squat with appropriate synergistic patterns to decrease pain and leakage at least 75% of the time.    Baseline:  Goal status: INITIAL  PLAN:  PT FREQUENCY: 1-2x/week  PT DURATION:  15 sessions  PLANNED INTERVENTIONS: 97110-Therapeutic exercises, 97530- Therapeutic activity, 97112- Neuromuscular re-education, 97535- Self Care, 16109- Manual therapy, Patient/Family education, Taping, Dry Needling, Joint mobilization, Spinal mobilization, Scar mobilization, DME instructions,  Cryotherapy, Moist heat, and Biofeedback  PLAN FOR NEXT SESSION: internal as needed, hip and trunk stretches, relaxation techniques    Avie Lemme, PT, DPT 04/21/252:43 PM

## 2024-01-13 ENCOUNTER — Telehealth: Payer: Self-pay

## 2024-01-13 DIAGNOSIS — Z87440 Personal history of urinary (tract) infections: Secondary | ICD-10-CM

## 2024-01-13 MED ORDER — METHENAMINE HIPPURATE 1 G PO TABS: 1.0000 g | ORAL_TABLET | Freq: Two times a day (BID) | ORAL | 2 refills | Status: DC

## 2024-01-13 NOTE — Telephone Encounter (Signed)
 Patient has been notified and aware of medication changes. She will give us  an update and let us  know how she's doing.

## 2024-01-13 NOTE — Telephone Encounter (Signed)
 Change to methenamine  for UTI suppression, take 1 tab twice daily.  Please call for repeat office evaluation if she continues to experience persistent or worsening urinary symptoms such as fever > 100.4, nausea/vomiting, one sided back pain or blood in urine. Our office will contact her to schedule cystoscopy to look in the bladder and r/o any other sources that can cause discomfort inside her bladder.

## 2024-01-13 NOTE — Telephone Encounter (Signed)
 Patient called with an update. She has finished her Keflex  500mg  QID. She felt good for a few days after dropping down to the 250mg  a day and is now started to show symptoms again of the bladder burning pain. She is scheduled to start PT soon. She is using her estrogen cream but will not use the lidocaine  as she is addiment that this wont help due to her pain being internal and not external.

## 2024-01-15 NOTE — Progress Notes (Signed)
 Initial neurology clinic note  Reason for Evaluation: Consultation requested by Wellington Half, * for an opinion regarding numbness, tingling, and burning in both feet into legs. My final recommendations will be communicated back to the requesting physician by way of shared medical record or letter to requesting physician via [place]  mail.  HPI: This is Ms. Katrina Benitez, a 78 y.o. right-handed female with a medical history of lumbar spine disease s/p surgery, postherpetic neuralgia, prediabetes, HTN, afib (on eliquis ), GERD, SIADH, vit D deficiency, depression, anxiety, pelvic floor dysfunction who presents to neurology clinic with the chief complaint of numbness, tingling, and burning in both feet into legs. The patient is alone today. She is a retired Marine scientist.  Patient is here for numbness, tingling, and burning in both feet into legs. It is worse at night. She was diagnosed 20 years ago per patient. She was diagnosed by rheumatology and was sent to neurology. She was put on Lyrica, which did not help. Over the years, symptoms have worsened. She previously saw Dr. Tilda Fogo for shingles (2018), last in 2018. She has some imbalance and previously has fallen, but none in the last year. She is currently taking gabapentin  600 mg TID. If she does not take this, her pain is much worse.   She is here today because she states "there must be something new to treat of neuropathy."  She has bilateral edema in her legs. She has chronic right knee issues for which she has seen someone in the past (ortho?).  She denies cramps or twitching.   The patient does not report symptoms referable to autonomic dysfunction including impaired sweating, heat or cold intolerance, excessive mucosal dryness, gastroparetic early satiety, postprandial abdominal bloating, constipation, bowel or bladder dyscontrol, or syncope/presyncope/orthostatic intolerance. She has pelvic floor dysfunction.  She does not report any  constitutional symptoms like fever, night sweats, anorexia or unintentional weight loss.  EtOH use: 1 glass of wine with dinner; most days  Restrictive diet? Watches what she eats and may restrict her diet Family history of neuropathy/myopathy/neurologic disease? None known  She has had EMG in the past which she reports confirmed neuropathy.   MEDICATIONS:  Outpatient Encounter Medications as of 01/28/2024  Medication Sig   Alum Hydroxide-Mag Carbonate (GAVISCON EXTRA STRENGTH) 160-105 MG CHEW Chew 2 tablets by mouth daily as needed (upset stomach).   betamethasone  valerate (VALISONE ) 0.1 % cream APPLY TWICE DAILY   betamethasone  valerate ointment (VALISONE ) 0.1 % Apply 1 Application topically 2 (two) times daily as needed (itching).   Cholecalciferol 50 MCG (2000 UT) CAPS Take 4,000 Units by mouth daily.   diazepam  (VALIUM ) 10 MG tablet TAKE ONE TABLET EVERY TWELVE HOURS AS NEEDED FOR ANXIETY   diphenoxylate -atropine  (LOMOTIL ) 2.5-0.025 MG tablet TAKE ONE (1) TABLET FOUR TIMES EACH DAY AS NEEDED FOR DIARRHEA OR LOOSE STOOLS   ELIQUIS  5 MG TABS tablet TAKE ONE TABLET BY MOUTH TWICE DAILY   estradiol  (ESTRACE ) 0.1 MG/GM vaginal cream 1 gram vaginally nightly x 7 - 10 days and then twice weekly   gabapentin  (NEURONTIN ) 600 MG tablet TAKE ONE TABLET BY MOUTH THREE TIMES DAILY   hydrOXYzine  (VISTARIL ) 25 MG capsule Two capsules 4 times daily   lidocaine  (LMX) 4 % cream Use 0.5g peasize up to 3 times a day as needed for pain   methenamine  (HIPREX ) 1 g tablet Take 1 tablet (1 g total) by mouth 2 (two) times daily with a meal.   omeprazole  (PRILOSEC) 40 MG capsule TAKE ONE CAPSULE  EACH DAY   ondansetron  (ZOFRAN -ODT) 8 MG disintegrating tablet TAKE ONE TABLET EVERY EIGHT HOURS AS NEEDED FOR NAUSEA AND VOMITING   Polyethyl Glyc-Propyl Glyc PF (SYSTANE HYDRATION PF) 0.4-0.3 % SOLN Place 1 drop into both eyes daily as needed (dry eyes).   Pramoxine HCl (CERAVE ITCH RELIEF) 1 % CREA Apply 1 Application  topically daily as needed (itching).   Probiotic Product (ALIGN PO) Take 1 capsule by mouth daily.   sodium chloride  1 g tablet Take 1 g by mouth 2 (two) times daily with a meal.   triamcinolone  cream (KENALOG ) 0.1 % Apply 1 Application topically as needed (itching).   zolpidem  (AMBIEN ) 10 MG tablet TAKE ONE TABLET BY MOUTH AT BEDTIME AS NEEDED FOR SLEEP   cephALEXin  (KEFLEX ) 250 MG capsule Take 1 capsule (250 mg total) by mouth daily. (Patient not taking: Reported on 01/28/2024)   furosemide  (LASIX ) 20 MG tablet Take 1 tablet (20 mg total) by mouth daily. (Patient not taking: Reported on 01/28/2024)   Meth-Hyo-M Bl-Na Phos-Ph Sal (URIBEL ) 118 MG CAPS Take 1 capsule (118 mg total) by mouth 4 (four) times daily as needed (bladder pain). (Patient not taking: Reported on 01/28/2024)   mupirocin  ointment (BACTROBAN ) 2 % Apply 1 Application topically 2 (two) times daily. (Patient not taking: Reported on 01/28/2024)   phenazopyridine  (PYRIDIUM ) 200 MG tablet Take 1 tablet (200 mg total) by mouth 3 (three) times daily as needed for pain. (Patient not taking: Reported on 01/28/2024)   tacrolimus (PROTOPIC) 0.1 % ointment Apply topically 2 (two) times daily. (Patient not taking: Reported on 01/28/2024)   No facility-administered encounter medications on file as of 01/28/2024.    PAST MEDICAL HISTORY: Past Medical History:  Diagnosis Date   Allergy    Anemia    Anxiety    Arthritis    degenerative disc disorder   Cancer (HCC)    skin cancer   Cardiac arrhythmia    Cataract    surgery in right eye in 03/2022   Diverticulitis    Diverticulosis    GERD (gastroesophageal reflux disease)    Heart murmur    HTN (hypertension)    not on meds in 2 years per pt on 05/19/22 n   Infertility, female    Neuromuscular disorder (HCC)    idiopathic peripheral  neuropathy   Peripheral neuropathy    Positive PPD    SIADH (syndrome of inappropriate ADH production) (HCC)     PAST SURGICAL HISTORY: Past Surgical  History:  Procedure Laterality Date   BREAST BIOPSY Left    BREAST EXCISIONAL BIOPSY Left    benign   CATARACT EXTRACTION Right 2023   CERVICAL BIOPSY  W/ LOOP ELECTRODE EXCISION     CERVICAL POLYPECTOMY  02/2016   CHOLECYSTECTOMY N/A 06/02/2022   Procedure: LAPAROSCOPIC CHOLECYSTECTOMY WITH INTRAOPERATIVE CHOLANGIOGRAM;  Surgeon: Oralee Billow, MD;  Location: [facility];  Service: General;  Laterality: N/A;   COLONOSCOPY     COLPOSCOPY     DILATION AND CURETTAGE OF UTERUS     HYSTEROSCOPY     LOOP RECORDER IMPLANT     LUMBAR DISC SURGERY     LUMBAR LAMINECTOMY     TONSILLECTOMY     UPPER GASTROINTESTINAL ENDOSCOPY      ALLERGIES: Allergies  Allergen Reactions   Flagyl [Metronidazole] Hives   Hydromorphone Hives   Iodixanol Other (See Comments) and Swelling    Throat swelling  Other Reaction(s): Other (See Comments)  Throat swelling, Throat swelling, Throat swelling, Other reaction(s): Other,  Other (See Comments), Throat swelling, Throat swelling, Throat swelling, Throat swelling, Throat swelling   Fentanyl  Itching   Cipro [Ciprofloxacin Hcl] Hives   Dilaudid [Hydromorphone Hcl] Hives    FAMILY HISTORY: Family History  Problem Relation Age of Onset   Breast cancer Mother    Glaucoma Mother    Miscarriages / Stillbirths Mother    Heart attack Father    Depression Father    Early death Father    Heart disease Father    Depression Brother    Hypertension Brother    Tuberculosis Paternal Grandmother    Prostate cancer Paternal Grandfather    Colon cancer Neg Hx    Esophageal cancer Neg Hx    Rectal cancer Neg Hx    Stomach cancer Neg Hx    Uterine cancer Neg Hx    Bladder Cancer Neg Hx     SOCIAL HISTORY: Social History   Tobacco Use   Smoking status: Never   Smokeless tobacco: Never  Vaping Use   Vaping status: Never Used  Substance Use Topics   Alcohol use: Yes    Alcohol/week: 7.0 standard drinks of alcohol    Types: 7 Glasses of wine per week     Comment: 102 glsses wine daily- 1 -1.1/2 a day 01/28/24   Drug use: No   Social History   Social History Narrative   Lives at home with her husband. Two story   Right-handed.   1-2 cups caffeine  per day.   Retired Marine scientist     OBJECTIVE: PHYSICAL EXAM: BP (!) 137/58   Pulse 82   Ht 5' 4.75" (1.645 m)   LMP  (LMP Unknown)   SpO2 99%   BMI 32.37 kg/m   General: General appearance: Awake and alert. No distress. Cooperative with exam.  Skin: No obvious rash or jaundice. HEENT: Atraumatic. Anicteric. Lungs: Non-labored breathing on room air  Extremities: Bilateral lower extremity edema Psych: Affect appropriate.  Neurological: Mental Status: Alert. Speech fluent. No pseudobulbar affect Cranial Nerves: CNII: No RAPD. Visual fields grossly intact. CNIII, IV, VI: PERRL. No nystagmus. EOMI. CN V: Facial sensation intact bilaterally to fine touch. CN VII: Facial muscles symmetric and strong. No ptosis at rest. CN VIII: Hearing grossly intact bilaterally. CN IX: No hypophonia. CN X: Palate elevates symmetrically. CN XI: Full strength shoulder shrug bilaterally. CN XII: Tongue protrusion full and midline. No atrophy or fasciculations. No significant dysarthria Motor: Tone is normal. Strength is 5/5 in bilateral upper and lower extremities. Reflexes:  Right Left   Bicep 2+ 2+   Tricep 2+ 2+   BrRad 2+ 2+   Knee 2+ 2+   Ankle Trace Trace    Sensation: Pinprick: Diminished in bilateral lower extremities to just below the knees medially and slightly more distally on lateral aspect. Intact in upper extremities. Vibration: Absent in bilateral great toes and ankles. Intact in upper extremities and bilateral patella. Proprioception: Intact in bilateral great toes Coordination: Intact finger-to- nose-finger bilaterally. Gait: Able to rise from chair with arms crossed unassisted. Wide-based, steady gait.  Lab and Test Review: Internal labs: HbA1c (11/27/23): 5.6 Vit D  (06/02/23) wnl Lipid panel (01/07/23): tChol 228, LDL 112, TG 76 TSH (01/07/23) wnl ANA (09/12/20) negative B12 (12/12/15): 402 ACE (12/12/15) wnl Lyme panel (12/12/15) negative Tissue transglutaminase ab, IgA (10/03/15) negative  Imaging/Procedures: MRI cervical spine wo contrast (07/09/2017): IMPRESSION:  This MRI of the cervical spine without contrast shows the following: 1.    The spinal cord appears normal and there  is no significant spinal stenosis. 2.    There are multilevel degenerative changes as detailed above. At C3-C4 there is moderately severe foraminal narrowing to the right that could lead to compression of the right C4 nerve root. There is less potential for nerve root compression at the other levels.  MRI lumbar spine w/wo contrast (11/03/2016): IMPRESSION: 1. Moderate foraminal stenosis at left L1-L2 and right L5-S1. 2. No spinal canal stenosis.  MRI brain w/wo contrast (12/21/15): IMPRESSION:  This MRI of the brain with and without contrast shows the following: 1.   Mild generalized cortical atrophy. 2.   Small number of T2/FLAIR hyperintense foci in the pons and hemispheres consistent with minimal chronic microvascular ischemic age, typical for age.  3.   There is a normal enhancement pattern. There are no acute findings.  Carotid ultrasound (06/06/15): IMPRESSION: Color duplex indicates minimal heterogeneous plaque, with no hemodynamically significant stenosis by duplex criteria in the extracranial cerebrovascular circulation.  ASSESSMENT: Toi Sebastian is a 78 y.o. female who presents for evaluation of numbness, tingling, and burning in bilateral lower extremities. She has a relevant medical history of lumbar spine disease s/p surgery, postherpetic neuralgia, HTN, afib (on eliquis ), GERD, SIADH, vit D deficiency, pelvic floor dysfunction. Her neurological examination is pertinent for length dependent sensory loss. Available diagnostic data is significant for HbA1c of  5.6. Her B12 was normal in 2017. She had negative celiac testing in 2017. 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 She has had symptoms for about 20 years with no currently known risk factors. She may have contributions from lumbar stenosis as well. I will do labs to look for other treatable causes.  PLAN: -Blood work: B1. B12, IFE, vit D -Discussed EMG but patient agrees to defer -Alpha lipoic acid 600 mg once or twice daily -Change gabapentin  to 600 mg/600 mg/900 mg  -Return to clinic in 6 months  The impression above as well as the plan as outlined below were extensively discussed with the patient who voiced understanding. All questions were answered to their satisfaction.  The patient was counseled on pertinent fall precautions per the printed material provided today, and as noted under the "Patient Instructions" section below.  When available, results of the above investigations and possible further recommendations will be communicated to the patient via telephone/MyChart. Patient to call office if not contacted after expected testing turnaround time.   Total time spent reviewing records, interview, history/exam, documentation, and coordination of care on day of encounter:  70 min   Thank you for allowing me to participate in patient's care.  If I can answer any additional questions, I would be pleased to do so.  Rommie Coats, MD   CC: Wellington Half, FNP [address] [place] [address]  CC: Referring provider: Wellington Half, FNP [address] [place],  [place] [address]

## 2024-01-20 ENCOUNTER — Telehealth: Payer: Self-pay

## 2024-01-20 NOTE — Telephone Encounter (Signed)
 Please see MyChart message about ongoing UTI treatment plan. Pt is still confused and frustrated bout her plan of care and her constant change in medication treatment. We again reviewed the changes from start to finish to explain each medication change. We have scheduled her a virtual visit for 01-28-2024 at 8:20am

## 2024-01-21 ENCOUNTER — Ambulatory Visit (HOSPITAL_BASED_OUTPATIENT_CLINIC_OR_DEPARTMENT_OTHER): Admission: RE | Admit: 2024-01-21 | Source: Ambulatory Visit

## 2024-01-27 ENCOUNTER — Other Ambulatory Visit: Payer: Self-pay | Admitting: Family Medicine

## 2024-01-28 ENCOUNTER — Telehealth: Admitting: Obstetrics

## 2024-01-28 ENCOUNTER — Ambulatory Visit (INDEPENDENT_AMBULATORY_CARE_PROVIDER_SITE_OTHER): Admitting: Neurology

## 2024-01-28 ENCOUNTER — Encounter: Payer: Self-pay | Admitting: Neurology

## 2024-01-28 ENCOUNTER — Other Ambulatory Visit

## 2024-01-28 VITALS — BP 137/58 | HR 82 | Ht 64.75 in

## 2024-01-28 DIAGNOSIS — N952 Postmenopausal atrophic vaginitis: Secondary | ICD-10-CM | POA: Diagnosis not present

## 2024-01-28 DIAGNOSIS — G629 Polyneuropathy, unspecified: Secondary | ICD-10-CM

## 2024-01-28 DIAGNOSIS — L578 Other skin changes due to chronic exposure to nonionizing radiation: Secondary | ICD-10-CM | POA: Diagnosis not present

## 2024-01-28 DIAGNOSIS — L821 Other seborrheic keratosis: Secondary | ICD-10-CM | POA: Diagnosis not present

## 2024-01-28 DIAGNOSIS — E559 Vitamin D deficiency, unspecified: Secondary | ICD-10-CM

## 2024-01-28 DIAGNOSIS — R300 Dysuria: Secondary | ICD-10-CM | POA: Diagnosis not present

## 2024-01-28 DIAGNOSIS — K5900 Constipation, unspecified: Secondary | ICD-10-CM

## 2024-01-28 DIAGNOSIS — Z87440 Personal history of urinary (tract) infections: Secondary | ICD-10-CM

## 2024-01-28 DIAGNOSIS — D225 Melanocytic nevi of trunk: Secondary | ICD-10-CM | POA: Diagnosis not present

## 2024-01-28 DIAGNOSIS — Z85828 Personal history of other malignant neoplasm of skin: Secondary | ICD-10-CM | POA: Diagnosis not present

## 2024-01-28 DIAGNOSIS — R3914 Feeling of incomplete bladder emptying: Secondary | ICD-10-CM | POA: Diagnosis not present

## 2024-01-28 DIAGNOSIS — N94819 Vulvodynia, unspecified: Secondary | ICD-10-CM | POA: Diagnosis not present

## 2024-01-28 DIAGNOSIS — N3941 Urge incontinence: Secondary | ICD-10-CM

## 2024-01-28 DIAGNOSIS — R2689 Other abnormalities of gait and mobility: Secondary | ICD-10-CM

## 2024-01-28 MED ORDER — CEPHALEXIN 500 MG PO CAPS: 500.0000 mg | ORAL_CAPSULE | Freq: Two times a day (BID) | ORAL | 0 refills | Status: AC

## 2024-01-28 MED ORDER — GABAPENTIN 300 MG PO CAPS: 300.0000 mg | ORAL_CAPSULE | Freq: Every day | ORAL | 3 refills | Status: DC

## 2024-01-28 NOTE — Assessment & Plan Note (Signed)
-   symptoms started since vistaril  use, prior bristol I, III, IV on colace improved with daily Bristol III stool on 1 capful of miralax - intermittent loose stool, discussed reduction of miralax or resume fiber supplementation - continue to avoid chronic straining, as it can exacerbate her pelvic floor symptoms  - continue squatty potty use and pelvic floor relaxation during bowel movements - discussed association with sensation of incomplete bladder emptying

## 2024-01-28 NOTE — Progress Notes (Signed)
 Katrina Benitez  SUBJECTIVE  History of Present Illness: Katrina Benitez is a 78 y.o. female seen in follow-up for vulvodynia, vaginal atrophy, history of recurrent UTI, sensation of incomplete bladder emptying, constipation, and lower urinary tract symptoms. Plan at last Benitez was referral to pelvic floor PT, .   Patient location: home in [place] Provider location: Program[place], applications for [facility]  Patient consents to video call and understands the limitations of telehealth Benitez.  Negative UA on 12/10/23 Office evaluation 12/21/23 for persistent dysuria, hematuria and urinary frequency. UA UA negative, culture with 10K E. Coli resistant to bactrim , gentamicin, ampicillin and intermediate to Augmentin .  Rx macrobid  and pyridium  on 12/22/23 changed to Keflex  500mg  QID on 12/23/23 due to persistent symptoms and Rx to start methenamine  for UTI suppression.  Reports return of dysuria and some external itching on daily hipprex Resolution of urinary urgency and improved bladder control.  Keflex  with most relief towards the end of the treatment course Bowel movements, started using squatty potty with Bristol III stool  Miralax 1 capful/day Continues vaginal estrogen 1g twice a week, forgot to use for the past week Continues hydroxyzine  50mg  QID No relief with topical lidocaine  Reports worsening symptoms of peripheral neuropathy pending follow-up with Dr. Genita Keys (neurology)  Past Medical History: Patient  has a past medical history of Allergy, Anemia, Anxiety, Arthritis, Cancer (HCC), Cardiac arrhythmia, Cataract, Diverticulitis, Diverticulosis, GERD (gastroesophageal reflux disease), Heart murmur, HTN (hypertension), Infertility, female, Neuromuscular disorder (HCC), Peripheral neuropathy, Positive PPD, and SIADH (syndrome of inappropriate ADH production) (HCC).   Past Surgical History: She  has a past surgical history that includes Lumbar disc surgery; Lumbar  laminectomy; Tonsillectomy; Colonoscopy; Upper gastrointestinal endoscopy; Cervical polypectomy (02/2016); Breast biopsy (Left); Breast excisional biopsy (Left); Colposcopy; Dilation and curettage of uterus; Hysteroscopy; Cervical biopsy w/ loop electrode excision; Loop recorder implant; Cholecystectomy (N/A, 06/02/2022); and Cataract extraction (Right, 2023).   Medications: She has a current medication list which includes the following prescription(s): cephalexin , gaviscon extra strength, betamethasone  valerate, betamethasone  valerate ointment, cholecalciferol, diazepam , diphenoxylate -atropine , eliquis , estradiol , furosemide , gabapentin , gabapentin , hydroxyzine , lidocaine , uribel , methenamine , mupirocin  ointment, omeprazole , ondansetron , phenazopyridine , systane hydration pf, cerave itch relief, probiotic product, sodium chloride , tacrolimus, triamcinolone  cream, and zolpidem .   Allergies: Patient is allergic to flagyl [metronidazole], hydromorphone, iodixanol, fentanyl , cipro [ciprofloxacin hcl], and dilaudid [hydromorphone hcl].   Social History: Patient  reports that she has never smoked. She has never used smokeless tobacco. She reports current alcohol use of about 7.0 standard drinks of alcohol per week. She reports that she does not use drugs.     OBJECTIVE     Physical Exam: Gen: No apparent distress, A&O x 3.    ASSESSMENT AND PLAN    Katrina Benitez is a 78 y.o. with:  1. Feeling of incomplete bladder emptying   2. History of recurrent UTI (urinary tract infection)   3. Vulvodynia   4. Urge urinary incontinence   5. Vaginal atrophy   6. Constipation, unspecified constipation type   7. Dysuria     Feeling of incomplete bladder emptying Assessment & Plan: - previously catheterized for , after void - repeat at in office follow-up at the time of cystoscopy - possibly due to myofascial pain and constipation - reassess after optimization of stool consistency and continue  pelvic floor PT  - discussed history of peripheral neuropathy, reviewed need for urodynamics and renal imaging if abnormal bladder emptying.  - Cr 1.09 on 11/27/23   History of recurrent UTI (  urinary tract infection) Assessment & Plan: - reports improvement of UTI symptoms after Keflex  QID dosing and some return of dysuria for 1 week after missing vaginal estrogen dosing  - resume vaginal estrogen and increase to 1g 3x/week - continue methenamine   - prior POCT clean catch UA + trace leuk/+ heme, catheterized urine testing negative - denies kidney stones or hospitalization for pyelonephritis  - For treatment of recurrent urinary tract infections, we discussed management of recurrent UTIs including prophylaxis with a daily low dose antibiotic, transvaginal estrogen therapy, D-mannose, and cranberry supplements.  We discussed the role of diagnostic testing such as cystoscopy and upper tract imaging.   - continue low dose vaginal estrogen and probiotics - discussed overlapping symptomatology with bladder pain. 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  We also discussed using pyridium  and similar over-the-counter medications for pain relief as needed. We discussed the pentad of medications including Tums, an antihistamine such as Vistaril , amitriptyline , and L-arginine.  We also discussed in-office bladder instillations for pain flares, as well as cystoscopy with hydrodistention in the operating room, which can be both diagnostic and therapeutic. She was also given information on the IC Network at https://www.ic-network.com for bladder diet suggestions and patient forums for support. - continue hydroxyzine  QID for idiopathic generalized itching, discussed risks of anticholinergic medications - intermittent episodes of discomfort with negative cultures,  discussed cystoscopy to r/o bladder pain syndrome - We reviewed the treatment options including expectant management, conservative management, medical management, and surgical management.  We reviewed the benefits and risks of each treatment option. We discussed risks of bleeding, infection, damage to surrounding organs including bowel, bladder, blood vessels, ureters and nerves, need for further surgery. All her questions were answered and she verbalized understanding.   - office to schedule cystoscopy, Rx Keflex  to start 5 days prior to scheduled procedure and discussed bladder instillation at the time of cystoscopy  Orders: -     Cephalexin ; Take 1 capsule (500 mg total) by mouth 2 (two) times daily for 5 days. Start 5 days before your scheduled cystoscopy  Dispense: 10 capsule; Refill: 0  Vulvodynia Assessment & Plan: - previously reviewed vulvodynia, reviewed comfort measures and treatment options. Discussed association with neuropathic pain due to history of peripheral neuropathy with recent worsening symptoms - Rx topical lidocaine  1g PRN pain up to 3x/day with minimal relief - consider Rx Amitriptyline  2.5%/ gabapentin  2.5%/ baclofen  2.5% in vaginal cream. Sent to compounding pharmacy for use daily - discussed conservative management options with cold compress as needed for comfort - continue pelvic floor PT  - discussed proper vulvar care, warm compression, avoid pad use, cotton only underwear and barrier ointment if needed  - consider nuswab if refractory symptoms or biopsy   Urge urinary incontinence Assessment & Plan: - We discussed the symptoms of overactive bladder (OAB), which include urinary urgency, urinary frequency, nocturia, with or without urge incontinence.  While we do not know the exact etiology of OAB, several treatment options exist. We discussed management including behavioral therapy (decreasing bladder irritants, urge suppression strategies, timed voids, bladder  retraining), physical therapy, medication; for refractory cases posterior tibial nerve stimulation, sacral neuromodulation, and intravesical botulinum toxin injection.  For anticholinergic medications, we discussed the potential side effects of anticholinergics including dry eyes, dry mouth, constipation, cognitive impairment and urinary retention. For Beta-3 agonist medication, we discussed the potential side effect  of elevated blood pressure which is more likely to occur in individuals with uncontrolled hypertension. - encouraged to continue pelvic floor PT, pending to return 02/2024 - repeat postvoid residual at follow-up to r/o incomplete emptying - consider trial of Gemtesa if PVR WNL - improved since prior resolution of dysuria   Vaginal atrophy Assessment & Plan: - For symptomatic vaginal atrophy options include lubrication with a water-based lubricant, personal hygiene measures and barrier protection against wetness, and estrogen replacement in the form of vaginal cream, vaginal tablets, or a time-released vaginal ring.   - resume vaginal estrogen 1g 2-3 times a week   Constipation, unspecified constipation type Assessment & Plan: - symptoms started since vistaril  use, prior bristol I, III, IV on colace improved with daily Bristol III stool on 1 capful of miralax - intermittent loose stool, discussed reduction of miralax or resume fiber supplementation - continue to avoid chronic straining, as it can exacerbate her pelvic floor symptoms  - continue squatty potty use and pelvic floor relaxation during bowel movements - discussed association with sensation of incomplete bladder emptying   Dysuria -     Cephalexin ; Take 1 capsule (500 mg total) by mouth 2 (two) times daily for 5 days. Start 5 days before your scheduled cystoscopy  Dispense: 10 capsule; Refill: 0  Time spent: I spent 35 minutes dedicated to the care of this patient on the date of this encounter to include pre-Benitez  review of records, face-to-face time with the patient discussing history of recurrent UTI, dysuria, vaginal atrophy, constipation, feeling of incomplete bladder emptying, vulvodynia, and post Benitez documentation and ordering medication/ testing.   Darlene Ehlers, MD

## 2024-01-28 NOTE — Assessment & Plan Note (Signed)
-   We discussed the symptoms of overactive bladder (OAB), which include urinary urgency, urinary frequency, nocturia, with or without urge incontinence.  While we do not know the exact etiology of OAB, several treatment options exist. We discussed management including behavioral therapy (decreasing bladder irritants, urge suppression strategies, timed voids, bladder retraining), physical therapy, medication; for refractory cases posterior tibial nerve stimulation, sacral neuromodulation, and intravesical botulinum toxin injection.  For anticholinergic medications, we discussed the potential side effects of anticholinergics including dry eyes, dry mouth, constipation, cognitive impairment and urinary retention. For Beta-3 agonist medication, we discussed the potential side effect of elevated blood pressure which is more likely to occur in individuals with uncontrolled hypertension. - encouraged to continue pelvic floor PT, pending to return 02/2024 - repeat postvoid residual at follow-up to r/o incomplete emptying - consider trial of Gemtesa if PVR WNL - improved since prior resolution of dysuria

## 2024-01-28 NOTE — Assessment & Plan Note (Signed)
-   For symptomatic vaginal atrophy options include lubrication with a water-based lubricant, personal hygiene measures and barrier protection against wetness, and estrogen replacement in the form of vaginal cream, vaginal tablets, or a time-released vaginal ring.   - resume vaginal estrogen 1g 2-3 times a week

## 2024-01-28 NOTE — Patient Instructions (Signed)
 Resume vaginal estrogen 1g two to three times a week.   My office will contact you regarding your cystoscopy.   Start Keflex  twice daily 5 days prior to your scheduled cystoscopy if you experience any change in urinary symptoms.   Continue Miralax 1 capful daily and resume fiber supplementation.   Continue squatting position for bowel movements and pelvic floor physical therapy.

## 2024-01-28 NOTE — Assessment & Plan Note (Signed)
-   previously catheterized for , after void - repeat at in office follow-up at the time of cystoscopy - possibly due to myofascial pain and constipation - reassess after optimization of stool consistency and continue pelvic floor PT  - discussed history of peripheral neuropathy, reviewed need for urodynamics and renal imaging if abnormal bladder emptying.  - Cr 1.09 on 11/27/23

## 2024-01-28 NOTE — Assessment & Plan Note (Signed)
-   previously reviewed vulvodynia, reviewed comfort measures and treatment options. Discussed association with neuropathic pain due to history of peripheral neuropathy with recent worsening symptoms - Rx topical lidocaine  1g PRN pain up to 3x/day with minimal relief - consider Rx Amitriptyline  2.5%/ gabapentin  2.5%/ baclofen  2.5% in vaginal cream. Sent to compounding pharmacy for use daily - discussed conservative management options with cold compress as needed for comfort - continue pelvic floor PT  - discussed proper vulvar care, warm compression, avoid pad use, cotton only underwear and barrier ointment if needed  - consider nuswab if refractory symptoms or biopsy

## 2024-01-28 NOTE — Patient Instructions (Addendum)
 I agree your symptoms are compatible with neuropathy.  I would like to do blood work today to make sure there is no cause that has been missed. I will be in touch when I have your results.  Alpha lipoic acid 600mg  daily has some research data suggesting it helps with nerve health. No major side effects other than <1% of people report upset stomach. This can be taken twice per day (1200mg  daily) if no relief obtained. You can buy this over the counter or online.  I will change your gabapentin  to include an additional 300 mg at bedtime. You will now take 600 mg in the morning, 600 mg midday, and 900 mg at bedtime.    I will see you again in clinic in 6 months. Please let me know if you have any questions or concerns in the meantime.   The physicians and staff at [facility] are committed to providing excellent care. You may receive a survey requesting feedback about your experience at our office. We strive to receive "very good" responses to the survey questions. If you feel that your experience would prevent you from giving the office a "very good " response, please contact our office to try to remedy the situation. We may be reached at [phone]. Thank you for taking the time out of your busy day to complete the survey.  Rommie Coats, MD [facility]

## 2024-01-28 NOTE — Assessment & Plan Note (Addendum)
-   reports improvement of UTI symptoms after Keflex  QID dosing and some return of dysuria for 1 week after missing vaginal estrogen dosing  - resume vaginal estrogen and increase to 1g 3x/week - continue methenamine   - prior POCT clean catch UA + trace leuk/+ heme, catheterized urine testing negative - denies kidney stones or hospitalization for pyelonephritis  - For treatment of recurrent urinary tract infections, we discussed management of recurrent UTIs including prophylaxis with a daily low dose antibiotic, transvaginal estrogen therapy, D-mannose, and cranberry supplements.  We discussed the role of diagnostic testing such as cystoscopy and upper tract imaging.   - continue low dose vaginal estrogen and probiotics - discussed overlapping symptomatology with bladder pain. 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  We also discussed using pyridium  and similar over-the-counter medications for pain relief as needed. We discussed the pentad of medications including Tums, an antihistamine such as Vistaril , amitriptyline , and L-arginine.  We also discussed in-office bladder instillations for pain flares, as well as cystoscopy with hydrodistention in the operating room, which can be both diagnostic and therapeutic. She was also given information on the IC Network at https://www.ic-network.com for bladder diet suggestions and patient forums for support. 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  We reviewed the benefits and risks of each treatment  option. We discussed risks of bleeding, infection, damage to surrounding organs including bowel, bladder, blood vessels, ureters and nerves, need for further surgery. All her questions were answered and she verbalized understanding.   - office to schedule cystoscopy, Rx Keflex  to start 5 days prior to scheduled procedure and discussed bladder instillation at the time of cystoscopy

## 2024-01-29 ENCOUNTER — Telehealth: Payer: Self-pay | Admitting: Family Medicine

## 2024-01-29 NOTE — Telephone Encounter (Signed)
 Spoke with patient, states her last refill was in December, she takes ambien  as needed. Has been out for about a week. Please advise refill

## 2024-01-29 NOTE — Telephone Encounter (Signed)
 Copied from CRM [id number]. Topic: Clinical - Medication Question >> Jan 29, 2024  9:17 AM Orien Bird wrote: Reason for CRM: Patient is wanting her zolpidem  (AMBIEN ) 10 MG tablet, to be sent in today she stated she cannot wait on this.

## 2024-01-29 NOTE — Telephone Encounter (Signed)
 LVM for patient to return call.

## 2024-02-01 ENCOUNTER — Ambulatory Visit: Payer: Medicare Other

## 2024-02-01 ENCOUNTER — Ambulatory Visit (HOSPITAL_BASED_OUTPATIENT_CLINIC_OR_DEPARTMENT_OTHER)
Admission: RE | Admit: 2024-02-01 | Discharge: 2024-02-01 | Disposition: A | Discharge status: Home / Self Care | Source: Ambulatory Visit | Attending: Obstetrics & Gynecology | Admitting: Obstetrics & Gynecology

## 2024-02-01 ENCOUNTER — Encounter: Payer: Medicare Other | Admitting: Podiatry

## 2024-02-01 DIAGNOSIS — R102 Pelvic and perineal pain: Secondary | ICD-10-CM | POA: Diagnosis not present

## 2024-02-01 DIAGNOSIS — D259 Leiomyoma of uterus, unspecified: Secondary | ICD-10-CM | POA: Diagnosis not present

## 2024-02-01 DIAGNOSIS — I480 Paroxysmal atrial fibrillation: Secondary | ICD-10-CM | POA: Diagnosis not present

## 2024-02-01 DIAGNOSIS — H04122 Dry eye syndrome of left lacrimal gland: Secondary | ICD-10-CM | POA: Diagnosis not present

## 2024-02-02 ENCOUNTER — Ambulatory Visit: Payer: Self-pay | Admitting: Neurology

## 2024-02-02 LAB — CUP PACEART REMOTE DEVICE CHECK
Date Time Interrogation Session: 20250511234004
Implantable Pulse Generator Implant Date: 20230614

## 2024-02-03 NOTE — Progress Notes (Signed)
 Patient did not show for her scheduled appointment on 02/01/2024

## 2024-02-05 LAB — IMMUNOFIXATION ELECTROPHORESIS
IgM, Serum: 159 mg/dL (ref 50–300)
IgM, Serum: 787 mg/dL (ref 600–300)
Immunoglobulin A: 127 mg/dL (ref 70–320)
Immunoglobulin A: 787 mg/dL (ref 70–320)

## 2024-02-05 LAB — VITAMIN D 25 HYDROXY (VIT D DEFICIENCY, FRACTURES): Vit D, 25-Hydroxy: 42 ng/mL (ref 30–100)

## 2024-02-05 LAB — VITAMIN B1: Vitamin B1 (Thiamine): 7 nmol/L — ABNORMAL LOW (ref 8–30)

## 2024-02-05 LAB — VITAMIN B12: Vitamin B-12: 349 pg/mL (ref 200–1100)

## 2024-02-08 ENCOUNTER — Ambulatory Visit: Payer: Medicare Other | Admitting: Obstetrics and Gynecology

## 2024-02-08 ENCOUNTER — Ambulatory Visit: Payer: Self-pay | Admitting: Internal Medicine

## 2024-02-08 ENCOUNTER — Other Ambulatory Visit

## 2024-02-08 DIAGNOSIS — E222 Syndrome of inappropriate secretion of antidiuretic hormone: Secondary | ICD-10-CM

## 2024-02-08 LAB — BASIC METABOLIC PANEL WITH GFR
BUN: 18 mg/dL (ref 6–23)
CO2: 28 meq/L (ref 19–32)
Calcium: 9.5 mg/dL (ref 8.4–10.5)
Chloride: 94 meq/L — ABNORMAL LOW (ref 96–112)
Creatinine, Ser: 0.9 mg/dL (ref 0.40–1.20)
GFR: 61.54 mL/min (ref >60.00)
Glucose, Bld: 98 mg/dL (ref 70–99)
Potassium: 4.9 meq/L (ref 3.5–5.1)
Sodium: 130 meq/L — ABNORMAL LOW (ref 135–145)

## 2024-02-11 ENCOUNTER — Encounter: Payer: Self-pay | Admitting: Physical Therapy

## 2024-02-12 ENCOUNTER — Other Ambulatory Visit: Payer: Self-pay | Admitting: Family Medicine

## 2024-02-12 DIAGNOSIS — F419 Anxiety disorder, unspecified: Secondary | ICD-10-CM

## 2024-02-13 NOTE — Telephone Encounter (Signed)
 Needs appt

## 2024-02-16 ENCOUNTER — Ambulatory Visit: Admitting: Obstetrics

## 2024-02-16 DIAGNOSIS — R3989 Other symptoms and signs involving the genitourinary system: Secondary | ICD-10-CM | POA: Diagnosis not present

## 2024-02-17 NOTE — Progress Notes (Signed)
 Carelink Summary Report / Loop Recorder

## 2024-02-17 NOTE — Addendum Note (Signed)
 Addended by: Edra Govern D on: 02/17/2024 03:32 PM   Modules accepted: Orders

## 2024-02-17 NOTE — Telephone Encounter (Signed)
 Called and spoke to patient and got her scheduled to see Arlester Ladd on 04/13/24.

## 2024-02-19 ENCOUNTER — Ambulatory Visit: Payer: Medicare Other | Admitting: Obstetrics and Gynecology

## 2024-02-22 ENCOUNTER — Encounter (INDEPENDENT_AMBULATORY_CARE_PROVIDER_SITE_OTHER): Admitting: Podiatry

## 2024-02-22 ENCOUNTER — Ambulatory Visit: Admitting: Obstetrics

## 2024-02-22 ENCOUNTER — Telehealth (INDEPENDENT_AMBULATORY_CARE_PROVIDER_SITE_OTHER): Admitting: Family Medicine

## 2024-02-22 DIAGNOSIS — E222 Syndrome of inappropriate secretion of antidiuretic hormone: Secondary | ICD-10-CM | POA: Diagnosis not present

## 2024-02-22 DIAGNOSIS — E871 Hypo-osmolality and hyponatremia: Secondary | ICD-10-CM

## 2024-02-22 DIAGNOSIS — Z79899 Other long term (current) drug therapy: Secondary | ICD-10-CM

## 2024-02-22 DIAGNOSIS — Z91199 Patient's noncompliance with other medical treatment and regimen due to unspecified reason: Secondary | ICD-10-CM

## 2024-02-22 DIAGNOSIS — R3989 Other symptoms and signs involving the genitourinary system: Secondary | ICD-10-CM | POA: Diagnosis not present

## 2024-02-22 DIAGNOSIS — M545 Low back pain, unspecified: Secondary | ICD-10-CM

## 2024-02-22 DIAGNOSIS — N390 Urinary tract infection, site not specified: Secondary | ICD-10-CM

## 2024-02-22 NOTE — Progress Notes (Signed)
 Virtual Visit via Video Note  I connected with Katrina Benitez on 02/28/24 at  2:20 PM EDT by a video enabled telemedicine application and verified that I am speaking with the correct person using two identifiers.  Patient Location: Home Provider Location: Office/Clinic  I discussed the limitations, risks, security, and privacy concerns of performing an evaluation and management service by video and the availability of in person appointments. I also discussed with the patient that there may be a patient responsible charge related to this service. The patient expressed understanding and agreed to proceed.  Subjective: PCP: Wellington Half, FNP  No chief complaint on file.  HPI 78 year old female presents for A/V visit for evaluation of hyponatremia. States that 5 weeks ago she tried Lasix  20 mg once a day for 2 days, and 40 mg once a day 1 day. Taking salt tablets with food, states that sometimes she forgets. Denies symptomatic hyponatremia including tremors, seizure, dizziness, other concerning symptoms.  Has history of recurrent UTIs, currently taking Hiprex  twice a day and feeling much better with this. Has cystoscopy scheduled with urogynecology.  Has been having recurrent low back pain.  States that she is sleeping in her recliner.  States pain radiates from the lumbar area around to the waist. It is really affecting her daily life and slowing her down. Not taking medications for this right now.  Denies other concerns today. Medical history as outlined below.   ROS: Per HPI  Current Outpatient Medications:    Alum Hydroxide-Mag Carbonate (GAVISCON EXTRA STRENGTH) 160-105 MG CHEW, Chew 2 tablets by mouth daily as needed (upset stomach)., Disp: , Rfl:    betamethasone  valerate (VALISONE ) 0.1 % cream, APPLY TWICE DAILY, Disp: 45 g, Rfl: 0   betamethasone  valerate ointment (VALISONE ) 0.1 %, Apply 1 Application topically 2 (two) times daily as needed (itching)., Disp: ,  Rfl:    Cholecalciferol 50 MCG (2000 UT) CAPS, Take 4,000 Units by mouth daily., Disp: , Rfl:    diazepam  (VALIUM ) 10 MG tablet, TAKE ONE TABLET EVERY TWELVE HOURS AS NEEDED FOR ANXIETY, Disp: 60 tablet, Rfl: 0   diphenoxylate -atropine  (LOMOTIL ) 2.5-0.025 MG tablet, TAKE ONE (1) TABLET FOUR TIMES EACH DAY AS NEEDED FOR DIARRHEA OR LOOSE STOOLS, Disp: 120 tablet, Rfl: 1   ELIQUIS  5 MG TABS tablet, TAKE ONE TABLET BY MOUTH TWICE DAILY, Disp: 60 tablet, Rfl: 11   estradiol  (ESTRACE ) 0.1 MG/GM vaginal cream, 1 gram vaginally nightly x 7 - 10 days and then twice weekly, Disp: 42.5 g, Rfl: 1   furosemide  (LASIX ) 20 MG tablet, Take 1 tablet (20 mg total) by mouth daily. (Patient not taking: Reported on 01/28/2024), Disp: 30 tablet, Rfl: 3   gabapentin  (NEURONTIN ) 300 MG capsule, Take 1 capsule (300 mg total) by mouth at bedtime. Add to 600 mg capsule of gabapentin  at bedtime for a total of 900 mg at bedtime, Disp: 90 capsule, Rfl: 3   gabapentin  (NEURONTIN ) 600 MG tablet, TAKE ONE TABLET BY MOUTH THREE TIMES DAILY, Disp: 270 tablet, Rfl: 1   hydrOXYzine  (VISTARIL ) 25 MG capsule, Two capsules 4 times daily, Disp: 720 capsule, Rfl: 1   lidocaine  (LMX) 4 % cream, Use 0.5g peasize up to 3 times a day as needed for pain, Disp: 30 g, Rfl: 0   Meth-Hyo-M Bl-Na Phos-Ph Sal (URIBEL ) 118 MG CAPS, Take 1 capsule (118 mg total) by mouth 4 (four) times daily as needed (bladder pain). (Patient not taking: Reported on 01/28/2024), Disp: 30 capsule, Rfl: 2  methenamine  (HIPREX ) 1 g tablet, Take 1 tablet (1 g total) by mouth 2 (two) times daily with a meal., Disp: 30 tablet, Rfl: 2   mupirocin  ointment (BACTROBAN ) 2 %, Apply 1 Application topically 2 (two) times daily. (Patient not taking: Reported on 01/28/2024), Disp: 22 g, Rfl: 0   omeprazole  (PRILOSEC) 40 MG capsule, TAKE ONE CAPSULE EACH DAY, Disp: 90 capsule, Rfl: 1   ondansetron  (ZOFRAN -ODT) 8 MG disintegrating tablet, TAKE ONE TABLET EVERY EIGHT HOURS AS NEEDED FOR NAUSEA  AND VOMITING, Disp: 20 tablet, Rfl: 1   phenazopyridine  (PYRIDIUM ) 200 MG tablet, Take 1 tablet (200 mg total) by mouth 3 (three) times daily as needed for pain. (Patient not taking: Reported on 01/28/2024), Disp: 10 tablet, Rfl: 0   Polyethyl Glyc-Propyl Glyc PF (SYSTANE HYDRATION PF) 0.4-0.3 % SOLN, Place 1 drop into both eyes daily as needed (dry eyes)., Disp: , Rfl:    Pramoxine HCl (CERAVE ITCH RELIEF) 1 % CREA, Apply 1 Application topically daily as needed (itching)., Disp: , Rfl:    Probiotic Product (ALIGN PO), Take 1 capsule by mouth daily., Disp: , Rfl:    sodium chloride  1 g tablet, Take 1 g by mouth 2 (two) times daily with a meal., Disp: , Rfl:    tacrolimus (PROTOPIC) 0.1 % ointment, Apply topically 2 (two) times daily. (Patient not taking: Reported on 01/28/2024), Disp: , Rfl:    triamcinolone  cream (KENALOG ) 0.1 %, Apply 1 Application topically as needed (itching)., Disp: , Rfl:    zolpidem  (AMBIEN ) 10 MG tablet, TAKE ONE TABLET BY MOUTH AT BEDTIME AS NEEDED FOR SLEEP, Disp: 30 tablet, Rfl: 1  Observations/Objective: There were no vitals filed for this visit. Physical Exam Vitals and nursing note reviewed.  Constitutional:      General: She is not in acute distress. HENT:     Head: Normocephalic and atraumatic.  Eyes:     Extraocular Movements: Extraocular movements intact.  Pulmonary:     Effort: Pulmonary effort is normal.  Musculoskeletal:     Cervical back: Normal range of motion.  Neurological:     General: No focal deficit present.     Mental Status: She is alert and oriented to person, place, and time.  Psychiatric:        Mood and Affect: Mood normal.        Behavior: Behavior normal.     Assessment and Plan: Acute bilateral low back pain without sciatica Assessment & Plan: PT referral for possible dry needling  Orders: -     Ambulatory referral to Physical Therapy  SIADH (syndrome of inappropriate ADH production) (HCC) Assessment & Plan: Continue sodium  chloride tablets with food May use 20 mg Lasix  once daily as needed Monthly BMP  Orders: -     Basic metabolic panel with GFR; Future -     Basic metabolic panel with GFR; Future -     Basic metabolic panel with GFR; Future  Hyponatremia Assessment & Plan: Continue sodium chloride  tabs with food BMP ordered  Orders: -     Basic metabolic panel with GFR; Future -     Basic metabolic panel with GFR; Future -     Basic metabolic panel with GFR; Future  Recurrent UTI Assessment & Plan: Continue Hiprex , continue Uribel  Continue Pyridium  as needed  Follow-up for cystoscopy as scheduled   Bladder pain Assessment & Plan: Continue Hiprex , continue Uribel  Continue Pyridium  as needed  Follow-up for cystoscopy as scheduled   Medication management Assessment & Plan:  Continue current medication regimen, BMP, will dose adjust as needed  Orders: -     Basic metabolic panel with GFR; Future -     Basic metabolic panel with GFR; Future -     Basic metabolic panel with GFR; Future    Follow Up Instructions: Return in about 3 months (around 05/24/2024) for Med management.   I discussed the assessment and treatment plan with the patient. The patient was provided an opportunity to ask questions, and all were answered. The patient agreed with the plan and demonstrated an understanding of the instructions.   The patient was advised to call back or seek an in-person evaluation if the symptoms worsen or if the condition fails to improve as anticipated.  I spent 25 minutes on this patient encounter including counseling, diagnosis, treatment options, documentation, face-to-face time.  The above assessment and management plan was discussed with the patient. The patient verbalized understanding of and has agreed to the management plan.   Wellington Half, FNP

## 2024-02-22 NOTE — Patient Instructions (Addendum)
 We have placed a referral for you today.  Someone will be reaching out to get you scheduled.  I have ordered more labs for you to recheck sodium.  Follow-up with me for new or worsening symptoms.  Otherwise follow-up with me in about 3 months for medication management

## 2024-02-22 NOTE — Progress Notes (Signed)
Patient did not show for scheduled appointment today.

## 2024-02-24 ENCOUNTER — Ambulatory Visit: Attending: Obstetrics | Admitting: Physical Therapy

## 2024-02-24 ENCOUNTER — Other Ambulatory Visit (INDEPENDENT_AMBULATORY_CARE_PROVIDER_SITE_OTHER)

## 2024-02-24 DIAGNOSIS — R293 Abnormal posture: Secondary | ICD-10-CM | POA: Diagnosis not present

## 2024-02-24 DIAGNOSIS — R279 Unspecified lack of coordination: Secondary | ICD-10-CM | POA: Diagnosis not present

## 2024-02-24 DIAGNOSIS — E222 Syndrome of inappropriate secretion of antidiuretic hormone: Secondary | ICD-10-CM

## 2024-02-24 DIAGNOSIS — M62838 Other muscle spasm: Secondary | ICD-10-CM | POA: Insufficient documentation

## 2024-02-24 DIAGNOSIS — M6281 Muscle weakness (generalized): Secondary | ICD-10-CM | POA: Insufficient documentation

## 2024-02-24 LAB — BASIC METABOLIC PANEL WITH GFR
BUN: 24 mg/dL — ABNORMAL HIGH (ref 6–23)
CO2: 28 meq/L (ref 19–32)
Calcium: 9 mg/dL (ref 8.4–10.5)
Chloride: 102 meq/L (ref 96–112)
Creatinine, Ser: 0.97 mg/dL (ref 0.40–1.20)
GFR: 56.23 mL/min — ABNORMAL LOW (ref >60.00)
Glucose, Bld: 93 mg/dL (ref 70–99)
Potassium: 4.6 meq/L (ref 3.5–5.1)
Sodium: 136 meq/L (ref 135–145)

## 2024-02-24 NOTE — Therapy (Signed)
 OUTPATIENT PHYSICAL THERAPY FEMALE PELVIC TREATMENT   Patient Name: Katrina Benitez MRN: [id number] DOB:September 13, 1946, 78 y.o., female Today's Date: 02/24/2024  END OF SESSION:  PT End of Session - 02/24/24 1504     Visit Number 3    Date for PT Re-Evaluation 03/28/24    Authorization Type MCR    Progress Note Due on Visit 10    PT Start Time 1459   arrival   PT Stop Time 1530    PT Time Calculation (min) 31 min    Activity Tolerance Patient tolerated treatment well    Behavior During Therapy WFL for tasks assessed/performed             Past Medical History:  Diagnosis Date   Allergy    Anemia    Anxiety    Arthritis    degenerative disc disorder   Cancer (HCC)    skin cancer   Cardiac arrhythmia    Cataract    surgery in right eye in 03/2022   Diverticulitis    Diverticulosis    GERD (gastroesophageal reflux disease)    Heart murmur    HTN (hypertension)    not on meds in 2 years per pt on 05/19/22 n   Infertility, female    Neuromuscular disorder (HCC)    idiopathic peripheral  neuropathy   Peripheral neuropathy    Positive PPD    SIADH (syndrome of inappropriate ADH production) (HCC)    Past Surgical History:  Procedure Laterality Date   BREAST BIOPSY Left    BREAST EXCISIONAL BIOPSY Left    benign   CATARACT EXTRACTION Right 2023   CERVICAL BIOPSY  W/ LOOP ELECTRODE EXCISION     CERVICAL POLYPECTOMY  02/2016   CHOLECYSTECTOMY N/A 06/02/2022   Procedure: LAPAROSCOPIC CHOLECYSTECTOMY WITH INTRAOPERATIVE CHOLANGIOGRAM;  Surgeon: Oralee Billow, MD;  Location: [facility];  Service: General;  Laterality: N/A;   COLONOSCOPY     COLPOSCOPY     DILATION AND CURETTAGE OF UTERUS     HYSTEROSCOPY     LOOP RECORDER IMPLANT     LUMBAR DISC SURGERY     LUMBAR LAMINECTOMY     TONSILLECTOMY     UPPER GASTROINTESTINAL ENDOSCOPY     Patient Active Problem List   Diagnosis Date Noted   Feeling of incomplete bladder emptying 12/10/2023   Urge urinary incontinence  12/10/2023   History of recurrent UTI (urinary tract infection) 12/10/2023   Vaginal atrophy 12/10/2023   Constipation 12/10/2023   Prediabetes 07/30/2023   High risk medication use 01/08/2023   Abdominal pain, right upper quadrant 05/18/2022   Symptomatic cholelithiasis 05/18/2022   Syncope and collapse 03/05/2022   Paroxysmal atrial fibrillation (HCC) 01/27/2022   Increased risk of breast cancer 04/18/2021   Anemia 01/09/2021   Pruritus 11/14/2019   Depression, recurrent (HCC) 02/18/2019   Primary insomnia 02/18/2019   Atrial fibrillation with RVR (HCC) 01/19/2019   Hyperlipidemia 09/30/2018   Post herpetic neuralgia 08/12/2018   Idiopathic peripheral neuropathy 08/12/2018   Vitamin D  deficiency 07/22/2018   Vulvar pruritus 04/16/2016   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   Opacity noted on imaging study    Chest pain 01/21/2016   SIADH (syndrome of inappropriate ADH production) (HCC) 01/21/2016   Hypertension 01/21/2016   Anxiety 01/21/2016   GERD (gastroesophageal reflux disease) 01/21/2016   Gait abnormality 12/12/2015    PCP: Wellington Half, FNP  REFERRING PROVIDER: Wellington Half, FNP  REFERRING DIAG: [id number] (ICD-10-CM) - Pelvic floor dysfunction in female  THERAPY DIAG:  Muscle weakness (generalized)  Unspecified lack of coordination  Abnormal posture  Rationale for Evaluation and Treatment: Rehabilitation  ONSET DATE: 3 years  SUBJECTIVE:                                                                                                                                                                                           SUBJECTIVE STATEMENT: Having a  lot of back pain unable to sleep in bed at all. Still having mild urinary incontinence but less burning and this better. Bowels are going ok    PAIN:  Are you having pain? Yes NPRS scale: 4/10 Pain location: low back  Pain type: stiffness Pain description:intermittent  Aggravating factors:  sitting, lying down Relieving factors: stretching and mobility  PRECAUTIONS: None  RED FLAGS: None   WEIGHT BEARING RESTRICTIONS: No  FALLS:  Has patient fallen in last 6 months? No  OCCUPATION: retired - physician   ACTIVITY LEVEL : lower   PLOF: Independent  PATIENT GOALS: to have less leakage  PERTINENT HISTORY:  Vulvodynia, Vulvar pruritus, Anxiety Sexual abuse: No  BOWEL MOVEMENT: Pain with bowel movement: Yes Type of bowel movement:Type (Bristol Stool Scale) vary, Frequency every day, and Strain yes Fully empty rectum: No Leakage: No Pads: No Fiber supplement/laxative miralax   URINATION: Pain with urination: Yes Fully empty bladder: No Stream: Strong and Weak Urgency: Yes  Frequency: sometimes every two hours; 1-3x nightly Leakage: Urge to void and Walking to the bathroom Pads: No  INTERCOURSE:  Ability to have vaginal penetration Yes  Pain with intercourse: none DrynessYes  Climax: not active   PREGNANCY: Vaginal deliveries 0  C-section deliveries 0   PROLAPSE: None   OBJECTIVE:  Note: Objective measures were completed at Evaluation unless otherwise noted.  DIAGNOSTIC FINDINGS:    COGNITION: Overall cognitive status: Within functional limits for tasks assessed     SENSATION: Light touch: Appears intact   FUNCTIONAL TESTS:  Functional squat - descent limited by 25%  GAIT: WFL  POSTURE: rounded shoulders, forward head, and posterior pelvic tilt   LUMBARAROM/PROM:  A/PROM A/PROM  eval  Flexion WFL  Extension WFL  Right lateral flexion Limited by 25%  Left lateral flexion Limited by 25%  Right rotation Limited by 25%  Left rotation Limited by 25%   (Blank rows = not tested)  LOWER EXTREMITY ROM:  Bil hamstrings and adductors limited by 25%  LOWER EXTREMITY MMT:  Bil hips grossly 4/5; knees 4+/5 PALPATION:   General: tightness at bil lumbar paraspinals   Pelvic Alignment: WFL  Abdominal: tightness noted in  abdominal lower quadrants but no TTP  External Perineal Exam: dryness noted at vulva, age related changes noted.                              Internal Pelvic Floor: mild TTP at bil obturators, tightness noted throughout deep and superficial layers  Patient confirms identification and approves PT to assess internal pelvic floor and treatment Yes No emotional/communication barriers or cognitive limitation. Patient is motivated to learn. Patient understands and agrees with treatment goals and plan. PT explains patient will be examined in standing, sitting, and lying down to see how their muscles and joints work. When they are ready, they will be asked to remove their underwear so PT can examine their perineum. The patient is also given the option of providing their own chaperone as one is not provided in our facility. The patient also has the right and is explained the right to defer or refuse any part of the evaluation or treatment including the internal exam. With the patient's consent, PT will use one gloved finger to gently assess the muscles of the pelvic floor, seeing how well it contracts and relaxes and if there is muscle symmetry. After, the patient will get dressed and PT and patient will discuss exam findings and plan of care. PT and patient discuss plan of care, schedule, attendance policy and HEP activities.  PELVIC MMT:   MMT eval  Vaginal 3/5 (but tightness throughout and decreased ability to relax pelvic floor)  Internal Anal Sphincter   External Anal Sphincter   Puborectalis   Diastasis Recti   (Blank rows = not tested)        TONE: Increased throughout   PROLAPSE: Not seen with rectal assessment   TODAY'S TREATMENT:                                                                                                                              DATE:   12/28/23 EVAL Examination completed, findings reviewed, pt educated on POC, HEP, and fiber types. Pt motivated to  participate in PT and agreeable to attempt recommendations.    4/21: Pt educated on voiding mechanics, balloon breathing technique to decreased straining for bowel movements, and relaxation techniques to decreased tension at pelvic floor for improved emptying of bladder and bowels.  02/24/23: Lumbar rotations x10 Single knee to chest 3x30s each Butterfly 3x30s 2x10 bil marches in hooklying  X10 bridges  Educated on dry needling and pt would like to attempt, educated on Musician.    PATIENT EDUCATION:  Education details: [id number], fiber types Person educated: Patient Education method: Explanation, Demonstration, Tactile cues, Verbal cues, and Handouts Education comprehension: verbalized understanding, returned demonstration, verbal cues required, tactile cues required, and needs further education  HOME EXERCISE PROGRAM: [id number]  ASSESSMENT:  CLINICAL IMPRESSION: Patient is a 78 y.o. female  who was seen today for physical therapy evaluation and treatment for pelvic floor tension, urinary burning  and pain, and constipation. Pt reports her bowels have been moving fairly well and feels these are better, urinary leakage still happening however pt reports she has been paying more attention to this has noticed she has leakage without awareness until going to the bathroom. Pt reports she feels her back pain is most limiting in her ability to do a lot of mobility and with internal assessment at eval did have tightness at pelvic floor and improving mobility at back to decreased strain at pelvic floor. Pt would benefit from additional PT to further address deficits.    OBJECTIVE IMPAIRMENTS: decreased activity tolerance, decreased coordination, decreased knowledge of condition, decreased mobility, decreased strength, increased fascial restrictions, increased muscle spasms, impaired flexibility, improper body mechanics, and postural dysfunction.   ACTIVITY LIMITATIONS: continence and  locomotion level  PARTICIPATION LIMITATIONS: community activity  PERSONAL FACTORS: Time since onset of injury/illness/exacerbation are also affecting patient's functional outcome.   REHAB POTENTIAL: Good  CLINICAL DECISION MAKING: Stable/uncomplicated  EVALUATION COMPLEXITY: Low   GOALS: Goals reviewed with patient? Yes  SHORT TERM GOALS: Target date: 01/25/23  Pt to be I with HEP.  Baseline: Goal status: INITIAL  2.  Pt to be I with relaxation techniques for decreased strain at pelvic floor. Baseline:  Goal status: INITIAL  3.  Pt will be independent with use of squatty potty, relaxed toileting mechanics, and improved bowel movement techniques in order to increase ease of bowel movements and complete evacuation.   Baseline:  Goal status: INITIAL  4.  Pt will be able to teach back and utilize urge suppression technique in order to help reduce number of trips to the bathroom.    Baseline:  Goal status: INITIAL  LONG TERM GOALS: Target date: 03/28/24  Pt to be I with advanced HEP.  Baseline:  Goal status: INITIAL  2.  Pt to report no more than 1 instance of urinary incontinence in a week due to improved pelvic floor tissue mobility and I with urge suppression techniques.  Baseline:  Goal status: INITIAL  3.  Pt to report no fecal leakage in at least 1 month due to improved evacuation habits.  Baseline:  Goal status: INITIAL  4.  Pt to demonstrate no restrictions at lumbar spine and bil hips for decreased strain at pelvic floor.  Baseline:  Goal status: INITIAL  5.  Pt to demonstrate at least 4/5 pelvic floor strength and ability to fully relax after each contraction for improved pelvic stability and decreased strain at pelvic floor/ decrease leakage.  Baseline:  Goal status: INITIAL  6.  Pt to demonstrate improved coordination of pelvic floor and breathing mechanics with 10# squat with appropriate synergistic patterns to decrease pain and leakage at least 75% of the  time.    Baseline:  Goal status: INITIAL  PLAN:  PT FREQUENCY: 1-2x/week  PT DURATION: 15 sessions  PLANNED INTERVENTIONS: 97110-Therapeutic exercises, 97530- Therapeutic activity, 97112- Neuromuscular re-education, 97535- Self Care, 81829- Manual therapy, Patient/Family education, Taping, Dry Needling, Joint mobilization, Spinal mobilization, Scar mobilization, DME instructions, Cryotherapy, Moist heat, and Biofeedback  PLAN FOR NEXT SESSION: internal as needed, hip and trunk stretches, relaxation techniques    Avie Lemme, PT, DPT 06/04/253:47 PM

## 2024-02-24 NOTE — Patient Instructions (Signed)

## 2024-02-25 ENCOUNTER — Ambulatory Visit: Payer: Self-pay | Admitting: Family Medicine

## 2024-02-25 ENCOUNTER — Ambulatory Visit: Admitting: Physical Therapy

## 2024-02-25 DIAGNOSIS — M62838 Other muscle spasm: Secondary | ICD-10-CM

## 2024-02-25 DIAGNOSIS — R279 Unspecified lack of coordination: Secondary | ICD-10-CM | POA: Diagnosis not present

## 2024-02-25 DIAGNOSIS — M6281 Muscle weakness (generalized): Secondary | ICD-10-CM | POA: Diagnosis not present

## 2024-02-25 DIAGNOSIS — R293 Abnormal posture: Secondary | ICD-10-CM

## 2024-02-25 NOTE — Therapy (Signed)
 OUTPATIENT PHYSICAL THERAPY FEMALE PELVIC TREATMENT   Patient Name: Katrina Benitez MRN: [id number] DOB:1946-07-10, 78 y.o., female Today's Date: 02/25/2024  END OF SESSION:  PT End of Session - 02/25/24 1234     Visit Number 4    Date for PT Re-Evaluation 03/28/24    Authorization Type MCR    Progress Note Due on Visit 10    PT Start Time 1230    PT Stop Time 1310    PT Time Calculation (min) 40 min    Activity Tolerance Patient tolerated treatment well    Behavior During Therapy WFL for tasks assessed/performed             Past Medical History:  Diagnosis Date   Allergy    Anemia    Anxiety    Arthritis    degenerative disc disorder   Cancer (HCC)    skin cancer   Cardiac arrhythmia    Cataract    surgery in right eye in 03/2022   Diverticulitis    Diverticulosis    GERD (gastroesophageal reflux disease)    Heart murmur    HTN (hypertension)    not on meds in 2 years per pt on 05/19/22 n   Infertility, female    Neuromuscular disorder (HCC)    idiopathic peripheral  neuropathy   Peripheral neuropathy    Positive PPD    SIADH (syndrome of inappropriate ADH production) (HCC)    Past Surgical History:  Procedure Laterality Date   BREAST BIOPSY Left    BREAST EXCISIONAL BIOPSY Left    benign   CATARACT EXTRACTION Right 2023   CERVICAL BIOPSY  W/ LOOP ELECTRODE EXCISION     CERVICAL POLYPECTOMY  02/2016   CHOLECYSTECTOMY N/A 06/02/2022   Procedure: LAPAROSCOPIC CHOLECYSTECTOMY WITH INTRAOPERATIVE CHOLANGIOGRAM;  Surgeon: Oralee Billow, MD;  Location: [facility];  Service: General;  Laterality: N/A;   COLONOSCOPY     COLPOSCOPY     DILATION AND CURETTAGE OF UTERUS     HYSTEROSCOPY     LOOP RECORDER IMPLANT     LUMBAR DISC SURGERY     LUMBAR LAMINECTOMY     TONSILLECTOMY     UPPER GASTROINTESTINAL ENDOSCOPY     Patient Active Problem List   Diagnosis Date Noted   Feeling of incomplete bladder emptying 12/10/2023   Urge urinary incontinence 12/10/2023    History of recurrent UTI (urinary tract infection) 12/10/2023   Vaginal atrophy 12/10/2023   Constipation 12/10/2023   Prediabetes 07/30/2023   High risk medication use 01/08/2023   Abdominal pain, right upper quadrant 05/18/2022   Symptomatic cholelithiasis 05/18/2022   Syncope and collapse 03/05/2022   Paroxysmal atrial fibrillation (HCC) 01/27/2022   Increased risk of breast cancer 04/18/2021   Anemia 01/09/2021   Pruritus 11/14/2019   Depression, recurrent (HCC) 02/18/2019   Primary insomnia 02/18/2019   Atrial fibrillation with RVR (HCC) 01/19/2019   Hyperlipidemia 09/30/2018   Post herpetic neuralgia 08/12/2018   Idiopathic peripheral neuropathy 08/12/2018   Vitamin D  deficiency 07/22/2018   Vulvar pruritus 04/16/2016   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   Opacity noted on imaging study    Chest pain 01/21/2016   SIADH (syndrome of inappropriate ADH production) (HCC) 01/21/2016   Hypertension 01/21/2016   Anxiety 01/21/2016   GERD (gastroesophageal reflux disease) 01/21/2016   Gait abnormality 12/12/2015    PCP: Wellington Half, FNP  REFERRING PROVIDER: Wellington Half, FNP  REFERRING DIAG: [phone] (ICD-10-CM) - Pelvic floor dysfunction in female  THERAPY DIAG:  Muscle  weakness (generalized)  Unspecified lack of coordination  Abnormal posture  Other muscle spasm  Rationale for Evaluation and Treatment: Rehabilitation  ONSET DATE: 3 years  SUBJECTIVE:                                                                                                                                                                                           SUBJECTIVE STATEMENT: Feels very tight after waking up this morning. MD told her to add to miralax this had made a difference in ability to fully empty now. No leakage of stool.  Bladder urgency has been bothersome did wear a pull up just in case.    PAIN:  Are you having pain? Yes NPRS scale: 4/10 Pain location: low  back  Pain type: stiffness Pain description:intermittent  Aggravating factors: sitting, lying down Relieving factors: stretching and mobility  PRECAUTIONS: None  RED FLAGS: None   WEIGHT BEARING RESTRICTIONS: No  FALLS:  Has patient fallen in last 6 months? No  OCCUPATION: retired - physician   ACTIVITY LEVEL : lower   PLOF: Independent  PATIENT GOALS: to have less leakage  PERTINENT HISTORY:  Vulvodynia, Vulvar pruritus, Anxiety Sexual abuse: No  BOWEL MOVEMENT: Pain with bowel movement: Yes Type of bowel movement:Type (Bristol Stool Scale) vary, Frequency every day, and Strain yes Fully empty rectum: No Leakage: No Pads: No Fiber supplement/laxative miralax   URINATION: Pain with urination: Yes Fully empty bladder: No Stream: Strong and Weak Urgency: Yes  Frequency: sometimes every two hours; 1-3x nightly Leakage: Urge to void and Walking to the bathroom Pads: No  INTERCOURSE:  Ability to have vaginal penetration Yes  Pain with intercourse: none DrynessYes  Climax: not active   PREGNANCY: Vaginal deliveries 0  C-section deliveries 0   PROLAPSE: None   OBJECTIVE:  Note: Objective measures were completed at Evaluation unless otherwise noted.  DIAGNOSTIC FINDINGS:    COGNITION: Overall cognitive status: Within functional limits for tasks assessed     SENSATION: Light touch: Appears intact   FUNCTIONAL TESTS:  Functional squat - descent limited by 25%  GAIT: WFL  POSTURE: rounded shoulders, forward head, and posterior pelvic tilt   LUMBARAROM/PROM:  A/PROM A/PROM  eval  Flexion WFL  Extension WFL  Right lateral flexion Limited by 25%  Left lateral flexion Limited by 25%  Right rotation Limited by 25%  Left rotation Limited by 25%   (Blank rows = not tested)  LOWER EXTREMITY ROM:  Bil hamstrings and adductors limited by 25%  LOWER EXTREMITY MMT:  Bil hips grossly 4/5; knees 4+/5 PALPATION:   General: tightness at  bil lumbar paraspinals  Pelvic Alignment: WFL  Abdominal: tightness noted in abdominal lower quadrants but no TTP                External Perineal Exam: dryness noted at vulva, age related changes noted.                              Internal Pelvic Floor: mild TTP at bil obturators, tightness noted throughout deep and superficial layers  Patient confirms identification and approves PT to assess internal pelvic floor and treatment Yes No emotional/communication barriers or cognitive limitation. Patient is motivated to learn. Patient understands and agrees with treatment goals and plan. PT explains patient will be examined in standing, sitting, and lying down to see how their muscles and joints work. When they are ready, they will be asked to remove their underwear so PT can examine their perineum. The patient is also given the option of providing their own chaperone as one is not provided in our facility. The patient also has the right and is explained the right to defer or refuse any part of the evaluation or treatment including the internal exam. With the patient's consent, PT will use one gloved finger to gently assess the muscles of the pelvic floor, seeing how well it contracts and relaxes and if there is muscle symmetry. After, the patient will get dressed and PT and patient will discuss exam findings and plan of care. PT and patient discuss plan of care, schedule, attendance policy and HEP activities.  PELVIC MMT:   MMT eval  Vaginal 3/5 (but tightness throughout and decreased ability to relax pelvic floor)  Internal Anal Sphincter   External Anal Sphincter   Puborectalis   Diastasis Recti   (Blank rows = not tested)        TONE: Increased throughout   PROLAPSE: Not seen with rectal assessment   TODAY'S TREATMENT:                                                                                                                              DATE:   12/28/23 EVAL Examination  completed, findings reviewed, pt educated on POC, HEP, and fiber types. Pt motivated to participate in PT and agreeable to attempt recommendations.    4/21: Pt educated on voiding mechanics, balloon breathing technique to decreased straining for bowel movements, and relaxation techniques to decreased tension at pelvic floor for improved emptying of bladder and bowels.  02/24/23: Lumbar rotations x10 Single knee to chest 3x30s each Butterfly 3x30s 2x10 bil marches in hooklying  X10 bridges  Educated on dry needling and pt would like to attempt, educated on Musician.   02/25/24: Trigger Point Dry Needling  Initial Treatment: Pt instructed on Dry Needling rational, procedures, and possible side effects. Pt instructed to expect mild to moderate muscle soreness later in the day and/or into the next day.  Pt instructed in methods  to reduce muscle soreness. Pt instructed to continue prescribed HEP. Patient was educated on signs and symptoms of infection and other risk factors and advised to seek medical attention should they occur.  Patient verbalized understanding of these instructions and education.   Patient Verbal Consent Given: Yes Education Handout Provided: Yes Muscles Treated: right and left glute max Electrical Stimulation Performed: No Treatment Response/Outcome: dense muscle gripping with twitch, good release noted. Decreased palpable tension and TTP. Improved lumbar extension in standing. No bleeding, pt denied pain.     PATIENT EDUCATION:  Education details: [id number], fiber types Person educated: Patient Education method: Explanation, Demonstration, Tactile cues, Verbal cues, and Handouts Education comprehension: verbalized understanding, returned demonstration, verbal cues required, tactile cues required, and needs further education  HOME EXERCISE PROGRAM: [id number]  ASSESSMENT:  CLINICAL IMPRESSION: Patient is a 78 y.o. female  who was seen today for physical  therapy treatment for pelvic floor tension, urinary burning and pain, and constipation. Pt reports she has been seeing improvement with urine and bowel symptoms but not resolved. Low back pain is very limiting in mobility and decreased mobility at pelvis. Pt agreeable to dry needling today, education above, tolerated needling well and denied pain at end of session, demonstrated good tension release. Pt would benefit from additional PT to further address deficits.    OBJECTIVE IMPAIRMENTS: decreased activity tolerance, decreased coordination, decreased knowledge of condition, decreased mobility, decreased strength, increased fascial restrictions, increased muscle spasms, impaired flexibility, improper body mechanics, and postural dysfunction.   ACTIVITY LIMITATIONS: continence and locomotion level  PARTICIPATION LIMITATIONS: community activity  PERSONAL FACTORS: Time since onset of injury/illness/exacerbation are also affecting patient's functional outcome.   REHAB POTENTIAL: Good  CLINICAL DECISION MAKING: Stable/uncomplicated  EVALUATION COMPLEXITY: Low   GOALS: Goals reviewed with patient? Yes  SHORT TERM GOALS: Target date: 01/25/23  Pt to be I with HEP.  Baseline: Goal status: INITIAL  2.  Pt to be I with relaxation techniques for decreased strain at pelvic floor. Baseline:  Goal status: INITIAL  3.  Pt will be independent with use of squatty potty, relaxed toileting mechanics, and improved bowel movement techniques in order to increase ease of bowel movements and complete evacuation.   Baseline:  Goal status: INITIAL  4.  Pt will be able to teach back and utilize urge suppression technique in order to help reduce number of trips to the bathroom.    Baseline:  Goal status: INITIAL  LONG TERM GOALS: Target date: 03/28/24  Pt to be I with advanced HEP.  Baseline:  Goal status: INITIAL  2.  Pt to report no more than 1 instance of urinary incontinence in a week due to improved  pelvic floor tissue mobility and I with urge suppression techniques.  Baseline:  Goal status: INITIAL  3.  Pt to report no fecal leakage in at least 1 month due to improved evacuation habits.  Baseline:  Goal status: INITIAL  4.  Pt to demonstrate no restrictions at lumbar spine and bil hips for decreased strain at pelvic floor.  Baseline:  Goal status: INITIAL  5.  Pt to demonstrate at least 4/5 pelvic floor strength and ability to fully relax after each contraction for improved pelvic stability and decreased strain at pelvic floor/ decrease leakage.  Baseline:  Goal status: INITIAL  6.  Pt to demonstrate improved coordination of pelvic floor and breathing mechanics with 10# squat with appropriate synergistic patterns to decrease pain and leakage at least 75% of the time.  Baseline:  Goal status: INITIAL  PLAN:  PT FREQUENCY: 1-2x/week  PT DURATION: 15 sessions  PLANNED INTERVENTIONS: 97110-Therapeutic exercises, 97530- Therapeutic activity, 97112- Neuromuscular re-education, 97535- Self Care, [id number]- Manual therapy, Patient/Family education, Taping, Dry Needling, Joint mobilization, Spinal mobilization, Scar mobilization, DME instructions, Cryotherapy, Moist heat, and Biofeedback  PLAN FOR NEXT SESSION: internal as needed, hip and trunk stretches, relaxation techniques    Avie Lemme, PT, DPT 06/05/252:39 PM

## 2024-02-25 NOTE — Patient Instructions (Signed)

## 2024-02-28 ENCOUNTER — Encounter: Payer: Self-pay | Admitting: Family Medicine

## 2024-02-28 DIAGNOSIS — E871 Hypo-osmolality and hyponatremia: Secondary | ICD-10-CM | POA: Insufficient documentation

## 2024-02-28 DIAGNOSIS — N390 Urinary tract infection, site not specified: Secondary | ICD-10-CM | POA: Insufficient documentation

## 2024-02-28 DIAGNOSIS — M545 Low back pain, unspecified: Secondary | ICD-10-CM | POA: Insufficient documentation

## 2024-02-28 NOTE — Assessment & Plan Note (Signed)
 PT referral for possible dry needling

## 2024-02-28 NOTE — Assessment & Plan Note (Signed)
 Continue Hiprex , continue Uribel  Continue Pyridium  as needed  Follow-up for cystoscopy as scheduled

## 2024-02-28 NOTE — Assessment & Plan Note (Signed)
 Continue sodium chloride  tablets with food May use 20 mg Lasix  once daily as needed Monthly BMP

## 2024-02-28 NOTE — Assessment & Plan Note (Signed)
 Continue current medication regimen, BMP, will dose adjust as needed

## 2024-02-28 NOTE — Assessment & Plan Note (Signed)
 Continue sodium chloride  tabs with food BMP ordered

## 2024-02-29 ENCOUNTER — Ambulatory Visit: Admitting: Physical Therapy

## 2024-02-29 DIAGNOSIS — R293 Abnormal posture: Secondary | ICD-10-CM

## 2024-02-29 DIAGNOSIS — R279 Unspecified lack of coordination: Secondary | ICD-10-CM

## 2024-02-29 DIAGNOSIS — M6281 Muscle weakness (generalized): Secondary | ICD-10-CM | POA: Diagnosis not present

## 2024-02-29 DIAGNOSIS — M62838 Other muscle spasm: Secondary | ICD-10-CM

## 2024-02-29 NOTE — Therapy (Signed)
 OUTPATIENT PHYSICAL THERAPY FEMALE PELVIC TREATMENT   Patient Name: Katrina Benitez MRN: [id number] DOB:[birth year], 78 y.o., female Today's Date: 02/29/2024  END OF SESSION:  PT End of Session - 02/29/24 1536     Visit Number 5    Date for PT Re-Evaluation 03/28/24    Authorization Type MCR    Progress Note Due on Visit 10    PT Start Time 1532    PT Stop Time 1621    PT Time Calculation (min) 49 min    Activity Tolerance Patient tolerated treatment well    Behavior During Therapy WFL for tasks assessed/performed             Past Medical History:  Diagnosis Date   Allergy    Anemia    Anxiety    Arthritis    degenerative disc disorder   Cancer (HCC)    skin cancer   Cardiac arrhythmia    Cataract    surgery in right eye in 03/2022   Diverticulitis    Diverticulosis    GERD (gastroesophageal reflux disease)    Heart murmur    HTN (hypertension)    not on meds in 2 years per pt on 05/19/22 n   Infertility, female    Neuromuscular disorder (HCC)    idiopathic peripheral  neuropathy   Peripheral neuropathy    Positive PPD    SIADH (syndrome of inappropriate ADH production) (HCC)    Past Surgical History:  Procedure Laterality Date   BREAST BIOPSY Left    BREAST EXCISIONAL BIOPSY Left    benign   CATARACT EXTRACTION Right 2023   CERVICAL BIOPSY  W/ LOOP ELECTRODE EXCISION     CERVICAL POLYPECTOMY  02/2016   CHOLECYSTECTOMY N/A 06/02/2022   Procedure: LAPAROSCOPIC CHOLECYSTECTOMY WITH INTRAOPERATIVE CHOLANGIOGRAM;  Surgeon: Oralee Billow, MD;  Location: [facility];  Service: General;  Laterality: N/A;   COLONOSCOPY     COLPOSCOPY     DILATION AND CURETTAGE OF UTERUS     HYSTEROSCOPY     LOOP RECORDER IMPLANT     LUMBAR DISC SURGERY     LUMBAR LAMINECTOMY     TONSILLECTOMY     UPPER GASTROINTESTINAL ENDOSCOPY     Patient Active Problem List   Diagnosis Date Noted   Acute bilateral low back pain without sciatica 02/28/2024   Hyponatremia 02/28/2024   Recurrent  UTI 02/28/2024   Bladder pain 12/10/2023   Urge urinary incontinence 12/10/2023   History of recurrent UTI (urinary tract infection) 12/10/2023   Vaginal atrophy 12/10/2023   Constipation 12/10/2023   Prediabetes 07/30/2023   Medication management 01/08/2023   Abdominal pain, right upper quadrant 05/18/2022   Symptomatic cholelithiasis 05/18/2022   Syncope and collapse 03/05/2022   Paroxysmal atrial fibrillation (HCC) 01/27/2022   Increased risk of breast cancer 04/18/2021   Anemia 01/09/2021   Pruritus 11/14/2019   Depression, recurrent (HCC) 02/18/2019   Primary insomnia 02/18/2019   Atrial fibrillation with RVR (HCC) 01/19/2019   Hyperlipidemia 09/30/2018   Post herpetic neuralgia 08/12/2018   Idiopathic peripheral neuropathy 08/12/2018   Vitamin D  deficiency 07/22/2018   Vulvar pruritus 04/16/2016   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   Opacity noted on imaging study    Chest pain 01/21/2016   SIADH (syndrome of inappropriate ADH production) (HCC) 01/21/2016   Hypertension 01/21/2016   Anxiety 01/21/2016   GERD (gastroesophageal reflux disease) 01/21/2016   Gait abnormality 12/12/2015    PCP: Wellington Half, FNP  REFERRING PROVIDER: Wellington Half, FNP  REFERRING  DIAG: M62.89 (ICD-10-CM) - Pelvic floor dysfunction in female  THERAPY DIAG:  Muscle weakness (generalized)  Unspecified lack of coordination  Abnormal posture  Other muscle spasm  Rationale for Evaluation and Treatment: Rehabilitation  ONSET DATE: 3 years  SUBJECTIVE:                                                                                                                                                                                           SUBJECTIVE STATEMENT: Feels very tight after waking up this morning. MD told her to add to miralax this had made a difference in ability to fully empty now. No leakage of stool.  Bladder urgency has been bothersome did wear a pull up just in case.     PAIN:  Are you having pain? Yes NPRS scale: 4/10 Pain location: low back  Pain type: stiffness Pain description:intermittent  Aggravating factors: sitting, lying down Relieving factors: stretching and mobility  PRECAUTIONS: None  RED FLAGS: None   WEIGHT BEARING RESTRICTIONS: No  FALLS:  Has patient fallen in last 6 months? No  OCCUPATION: retired - physician   ACTIVITY LEVEL : lower   PLOF: Independent  PATIENT GOALS: to have less leakage  PERTINENT HISTORY:  Vulvodynia, Vulvar pruritus, Anxiety Sexual abuse: No  BOWEL MOVEMENT: Pain with bowel movement: Yes Type of bowel movement:Type (Bristol Stool Scale) vary, Frequency every day, and Strain yes Fully empty rectum: No Leakage: No Pads: No Fiber supplement/laxative miralax   URINATION: Pain with urination: Yes Fully empty bladder: No Stream: Strong and Weak Urgency: Yes  Frequency: sometimes every two hours; 1-3x nightly Leakage: Urge to void and Walking to the bathroom Pads: No  INTERCOURSE:  Ability to have vaginal penetration Yes  Pain with intercourse: none DrynessYes  Climax: not active   PREGNANCY: Vaginal deliveries 0  C-section deliveries 0   PROLAPSE: None   OBJECTIVE:  Note: Objective measures were completed at Evaluation unless otherwise noted.  DIAGNOSTIC FINDINGS:    COGNITION: Overall cognitive status: Within functional limits for tasks assessed     SENSATION: Light touch: Appears intact   FUNCTIONAL TESTS:  Functional squat - descent limited by 25%  GAIT: WFL  POSTURE: rounded shoulders, forward head, and posterior pelvic tilt   LUMBARAROM/PROM:  A/PROM A/PROM  eval  Flexion WFL  Extension WFL  Right lateral flexion Limited by 25%  Left lateral flexion Limited by 25%  Right rotation Limited by 25%  Left rotation Limited by 25%   (Blank rows = not tested)  LOWER EXTREMITY ROM:  Bil hamstrings and adductors limited by 25%  LOWER EXTREMITY  MMT:  Bil hips grossly  4/5; knees 4+/5 PALPATION:   General: tightness at bil lumbar paraspinals   Pelvic Alignment: WFL  Abdominal: tightness noted in abdominal lower quadrants but no TTP                External Perineal Exam: dryness noted at vulva, age related changes noted.                              Internal Pelvic Floor: mild TTP at bil obturators, tightness noted throughout deep and superficial layers  Patient confirms identification and approves PT to assess internal pelvic floor and treatment Yes No emotional/communication barriers or cognitive limitation. Patient is motivated to learn. Patient understands and agrees with treatment goals and plan. PT explains patient will be examined in standing, sitting, and lying down to see how their muscles and joints work. When they are ready, they will be asked to remove their underwear so PT can examine their perineum. The patient is also given the option of providing their own chaperone as one is not provided in our facility. The patient also has the right and is explained the right to defer or refuse any part of the evaluation or treatment including the internal exam. With the patient's consent, PT will use one gloved finger to gently assess the muscles of the pelvic floor, seeing how well it contracts and relaxes and if there is muscle symmetry. After, the patient will get dressed and PT and patient will discuss exam findings and plan of care. PT and patient discuss plan of care, schedule, attendance policy and HEP activities.  PELVIC MMT:   MMT eval  Vaginal 3/5 (but tightness throughout and decreased ability to relax pelvic floor)  Internal Anal Sphincter   External Anal Sphincter   Puborectalis   Diastasis Recti   (Blank rows = not tested)        TONE: Increased throughout   PROLAPSE:  Not seen TODAY'S TREATMENT:                                                                                                                               DATE:    4/21: Pt educated on voiding mechanics, balloon breathing technique to decreased straining for bowel movements, and relaxation techniques to decreased tension at pelvic floor for improved emptying of bladder and bowels.  02/24/23: Lumbar rotations x10 Single knee to chest 3x30s each Butterfly 3x30s 2x10 bil marches in hooklying  X10 bridges  Educated on dry needling and pt would like to attempt, educated on Musician.   02/25/24: Trigger Point Dry Needling  Initial Treatment: Pt instructed on Dry Needling rational, procedures, and possible side effects. Pt instructed to expect mild to moderate muscle soreness later in the day and/or into the next day.  Pt instructed in methods to reduce muscle soreness. Pt instructed to continue prescribed HEP. Patient was educated on signs and symptoms  of infection and other risk factors and advised to seek medical attention should they occur.  Patient verbalized understanding of these instructions and education.   Patient Verbal Consent Given: Yes Education Handout Provided: Yes Muscles Treated: right and left glute max Electrical Stimulation Performed: No Treatment Response/Outcome: dense muscle gripping with twitch, good release noted. Decreased palpable tension and TTP. Improved lumbar extension in standing. No bleeding, pt denied pain.    02/29/24: Nustep 6 mins L6 PT present to discuss progress Single knee to chest 3x30s Lumbar rotations 2x10 Butterfly stretch 3x30s Manual - pt in supine, fascial release in lower and LT abdominal quadrants X10 diaphragmatic breathing in supine cues for belly mobility and decreased abdominal bracing Vibration plate low setting standing 1 min static standing, 1 min forward hinge with bil UE support   PATIENT EDUCATION:  Education details: [id number], fiber types Person educated: Patient Education method: Explanation, Demonstration, Tactile cues, Verbal cues, and Handouts Education  comprehension: verbalized understanding, returned demonstration, verbal cues required, tactile cues required, and needs further education  HOME EXERCISE PROGRAM: [id number]  ASSESSMENT:  CLINICAL IMPRESSION: Patient is a 78 y.o. female  who was seen today for physical therapy treatment for pelvic floor tension, urinary burning and pain, and constipation. Pt reports she has been seeing improvement with urine and bowel symptoms with bowels much less bothersome now. LBP still limiting and doesn't report seeing that much improvement from dry needling, open to attempting stretch and relaxation techniques today. Pt found to have great tension in abdominal lower quadrants, Lt>Rt and tolerated manual and vibration plate for improved mobility and decreased back and bladder pain. Pt would benefit from additional PT to further address deficits.    OBJECTIVE IMPAIRMENTS: decreased activity tolerance, decreased coordination, decreased knowledge of condition, decreased mobility, decreased strength, increased fascial restrictions, increased muscle spasms, impaired flexibility, improper body mechanics, and postural dysfunction.   ACTIVITY LIMITATIONS: continence and locomotion level  PARTICIPATION LIMITATIONS: community activity  PERSONAL FACTORS: Time since onset of injury/illness/exacerbation are also affecting patient's functional outcome.   REHAB POTENTIAL: Good  CLINICAL DECISION MAKING: Stable/uncomplicated  EVALUATION COMPLEXITY: Low   GOALS: Goals reviewed with patient? Yes  SHORT TERM GOALS: Target date: 01/25/23  Pt to be I with HEP.  Baseline: Goal status: INITIAL  2.  Pt to be I with relaxation techniques for decreased strain at pelvic floor. Baseline:  Goal status: INITIAL  3.  Pt will be independent with use of squatty potty, relaxed toileting mechanics, and improved bowel movement techniques in order to increase ease of bowel movements and complete evacuation.   Baseline:  Goal  status: INITIAL  4.  Pt will be able to teach back and utilize urge suppression technique in order to help reduce number of trips to the bathroom.    Baseline:  Goal status: INITIAL  LONG TERM GOALS: Target date: 03/28/24  Pt to be I with advanced HEP.  Baseline:  Goal status: INITIAL  2.  Pt to report no more than 1 instance of urinary incontinence in a week due to improved pelvic floor tissue mobility and I with urge suppression techniques.  Baseline:  Goal status: INITIAL  3.  Pt to report no fecal leakage in at least 1 month due to improved evacuation habits.  Baseline:  Goal status: INITIAL  4.  Pt to demonstrate no restrictions at lumbar spine and bil hips for decreased strain at pelvic floor.  Baseline:  Goal status: INITIAL  5.  Pt to demonstrate at least 4/5 pelvic  floor strength and ability to fully relax after each contraction for improved pelvic stability and decreased strain at pelvic floor/ decrease leakage.  Baseline:  Goal status: INITIAL  6.  Pt to demonstrate improved coordination of pelvic floor and breathing mechanics with 10# squat with appropriate synergistic patterns to decrease pain and leakage at least 75% of the time.    Baseline:  Goal status: INITIAL  PLAN:  PT FREQUENCY: 1-2x/week  PT DURATION: 15 sessions  PLANNED INTERVENTIONS: 97110-Therapeutic exercises, 97530- Therapeutic activity, 97112- Neuromuscular re-education, 97535- Self Care, 16109- Manual therapy, Patient/Family education, Taping, Dry Needling, Joint mobilization, Spinal mobilization, Scar mobilization, DME instructions, Cryotherapy, Moist heat, and Biofeedback  PLAN FOR NEXT SESSION: internal as needed, hip and trunk stretches, relaxation techniques    Avie Lemme, PT, DPT 06/09/254:24 PM

## 2024-03-02 ENCOUNTER — Ambulatory Visit: Admitting: Physical Therapy

## 2024-03-03 ENCOUNTER — Ambulatory Visit (INDEPENDENT_AMBULATORY_CARE_PROVIDER_SITE_OTHER)

## 2024-03-03 DIAGNOSIS — I480 Paroxysmal atrial fibrillation: Secondary | ICD-10-CM | POA: Diagnosis not present

## 2024-03-03 LAB — CUP PACEART REMOTE DEVICE CHECK
Date Time Interrogation Session: 20250611232126
Implantable Pulse Generator Implant Date: 20230614

## 2024-03-06 ENCOUNTER — Ambulatory Visit: Payer: Self-pay | Admitting: Internal Medicine

## 2024-03-07 ENCOUNTER — Ambulatory Visit: Admitting: Podiatry

## 2024-03-07 ENCOUNTER — Ambulatory Visit: Payer: Medicare Other

## 2024-03-07 ENCOUNTER — Ambulatory Visit: Admitting: Physical Therapy

## 2024-03-08 ENCOUNTER — Encounter: Payer: Self-pay | Admitting: Podiatry

## 2024-03-08 ENCOUNTER — Ambulatory Visit (INDEPENDENT_AMBULATORY_CARE_PROVIDER_SITE_OTHER): Admitting: Podiatry

## 2024-03-08 DIAGNOSIS — M79674 Pain in right toe(s): Secondary | ICD-10-CM

## 2024-03-08 DIAGNOSIS — B351 Tinea unguium: Secondary | ICD-10-CM | POA: Diagnosis not present

## 2024-03-08 DIAGNOSIS — M79675 Pain in left toe(s): Secondary | ICD-10-CM | POA: Diagnosis not present

## 2024-03-08 NOTE — Progress Notes (Unsigned)
 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  She would like to discuss treatment options today.  PCP is Wellington Half, FNP.  Past Medical History:  Diagnosis Date   Allergy    Anemia    Anxiety    Arthritis    degenerative disc disorder   Cancer (HCC)    skin cancer   Cardiac arrhythmia    Cataract    surgery in right eye in 03/2022   Diverticulitis    Diverticulosis    GERD (gastroesophageal reflux disease)    Heart murmur    HTN (hypertension)    not on meds in 2 years per pt on 05/19/22 n   Infertility, female    Neuromuscular disorder (HCC)    idiopathic peripheral  neuropathy   Peripheral neuropathy    Positive PPD    SIADH (syndrome of inappropriate ADH production) (HCC)    Past Surgical History:  Procedure Laterality Date   BREAST BIOPSY Left    BREAST EXCISIONAL BIOPSY Left    benign   CATARACT EXTRACTION Right 2023   CERVICAL BIOPSY  W/ LOOP ELECTRODE EXCISION     CERVICAL POLYPECTOMY  02/2016   CHOLECYSTECTOMY N/A 06/02/2022   Procedure: LAPAROSCOPIC CHOLECYSTECTOMY WITH INTRAOPERATIVE CHOLANGIOGRAM;  Surgeon: Oralee Billow, MD;  Location: [facility];  Service: General;  Laterality: N/A;   COLONOSCOPY     COLPOSCOPY     DILATION AND CURETTAGE OF UTERUS     HYSTEROSCOPY     LOOP RECORDER IMPLANT     LUMBAR DISC SURGERY     LUMBAR LAMINECTOMY     TONSILLECTOMY     UPPER GASTROINTESTINAL ENDOSCOPY     Allergies  Allergen Reactions   Flagyl [Metronidazole] Hives   Hydromorphone Hives   Iodixanol Other (See Comments) and Swelling    Throat swelling  Other Reaction(s): Other (See Comments)  Throat swelling, Throat swelling, Throat swelling,  Other reaction(s): Other, Other (See Comments), Throat swelling, Throat swelling, Throat swelling, Throat swelling, Throat swelling   Fentanyl  Itching   Cipro [Ciprofloxacin Hcl] Hives   Dilaudid [Hydromorphone Hcl] Hives    Review of Systems: Negative except as noted in the HPI.  Objective:  Katrina Benitez is a pleasant 78 y.o. female in NAD. AAO x 3.  Vascular Examination: Capillary refill time is 3-5 seconds to toes bilateral. Palpable pedal pulses b/l LE. Digital hair present b/l.  Skin temperature gradient WNL b/l. No varicosities b/l. No cyanosis noted b/l.   Dermatological Examination: Pedal skin with normal turgor, texture and tone b/l. No open wounds. No interdigital macerations b/l. Toenails x10 are 3mm thick, discolored, dystrophic with subungual debris. There is pain with compression of the nail plates.  They are elongated x10     Latest Ref Rng & Units 11/27/2023    3:32 PM  Hemoglobin A1C  Hemoglobin-A1c 4.6 - 6.5 % 5.6    Assessment/Plan: 1. Pain due to onychomycosis of toenails of both feet    The mycotic toenails were sharply debrided x10 with sterile nail nippers and a power debriding burr to decrease bulk/thickness and length.    Discussed topical versus oral versus laser treatment  for the fungal toenails.  The topical has approximately 50 to 60% chance of success and will require up to a year or more of daily application of the topical solution.  The oral medication has more risk involved, requiring hepatic panel blood test, but is up to 90% successful and requires 1 pill a day for 90 days.  The laser nail treatment is approximately 70 to 80% percent successful.  This will involve 1 treatment around 1 month apart, for a total of 6 treatments.  She will then follow-up with me after the sixth treatment for recheck.  She can also treat the nails with topical or oral medication while doing the laser therapy.  She opted to proceed with the laser nail treatment.  Will get  her scheduled for this under our nurse schedule and follow-up accordingly after her sixth treatment.   Joe Murders, DPM, FACFAS [facility]     [address] [place], [place] [address]                Office [phone]  Fax [phone]

## 2024-03-09 ENCOUNTER — Encounter: Admitting: Physical Therapy

## 2024-03-11 ENCOUNTER — Ambulatory Visit (HOSPITAL_BASED_OUTPATIENT_CLINIC_OR_DEPARTMENT_OTHER): Payer: Self-pay | Admitting: Obstetrics & Gynecology

## 2024-03-15 ENCOUNTER — Ambulatory Visit: Payer: Medicare Other

## 2024-03-16 ENCOUNTER — Ambulatory Visit: Admitting: Cardiovascular Disease

## 2024-03-16 DIAGNOSIS — D224 Melanocytic nevi of scalp and neck: Secondary | ICD-10-CM | POA: Diagnosis not present

## 2024-03-16 DIAGNOSIS — D225 Melanocytic nevi of trunk: Secondary | ICD-10-CM | POA: Diagnosis not present

## 2024-03-16 DIAGNOSIS — L905 Scar conditions and fibrosis of skin: Secondary | ICD-10-CM | POA: Diagnosis not present

## 2024-03-16 DIAGNOSIS — H61002 Unspecified perichondritis of left external ear: Secondary | ICD-10-CM | POA: Diagnosis not present

## 2024-03-16 DIAGNOSIS — Z85828 Personal history of other malignant neoplasm of skin: Secondary | ICD-10-CM | POA: Diagnosis not present

## 2024-03-16 DIAGNOSIS — L570 Actinic keratosis: Secondary | ICD-10-CM | POA: Diagnosis not present

## 2024-03-16 DIAGNOSIS — D2239 Melanocytic nevi of other parts of face: Secondary | ICD-10-CM | POA: Diagnosis not present

## 2024-03-16 DIAGNOSIS — L820 Inflamed seborrheic keratosis: Secondary | ICD-10-CM | POA: Diagnosis not present

## 2024-03-16 DIAGNOSIS — L821 Other seborrheic keratosis: Secondary | ICD-10-CM | POA: Diagnosis not present

## 2024-03-17 ENCOUNTER — Ambulatory Visit: Admitting: Obstetrics

## 2024-03-17 ENCOUNTER — Encounter: Payer: Self-pay | Admitting: Obstetrics

## 2024-03-17 VITALS — BP 130/72 | HR 80

## 2024-03-17 DIAGNOSIS — Z87440 Personal history of urinary (tract) infections: Secondary | ICD-10-CM | POA: Diagnosis not present

## 2024-03-17 DIAGNOSIS — N3941 Urge incontinence: Secondary | ICD-10-CM

## 2024-03-17 LAB — POCT URINALYSIS DIP (CLINITEK)
Bilirubin, UA: NEGATIVE
Blood, UA: NEGATIVE
Glucose, UA: NEGATIVE mg/dL
Ketones, POC UA: NEGATIVE mg/dL
Leukocytes, UA: NEGATIVE
Nitrite, UA: NEGATIVE
POC PROTEIN,UA: NEGATIVE
Spec Grav, UA: 1.015 (ref 1.010–1.025)
Urobilinogen, UA: 0.2 U/dL
pH, UA: 7 (ref 5.0–8.0)

## 2024-03-17 MED ORDER — LIDOCAINE HCL URETHRAL/MUCOSAL 2 % EX GEL
1.0000 | Freq: Once | CUTANEOUS | Status: AC
  Administered 2024-03-17: 1 via URETHRAL

## 2024-03-17 NOTE — Patient Instructions (Signed)

## 2024-03-17 NOTE — Progress Notes (Addendum)
 CYSTOSCOPY  CC:  This is a 78 y.o. with recurrent UTI and lower urinary symptoms who presents today for cystoscopy.  Persistent dysuria  Stopped methenamine  for UTI suppression with unclear relief, stopped after around 6 weeks Continues vaginal estrogen 1g 2x/week Evaluated by Dr. Dorice at [facility], recommended to proceed with cystoscopy and offered PTNS Pending appt to continue pelvic floor PT 03/21/24  Lab Results  Component Value Date   COLORU yellow 03/17/2024   CLARITYU clear 03/17/2024   GLUCOSEUR negative 03/17/2024   BILIRUBINUR negative 03/17/2024   KETONESU Negative 12/21/2023   SPECGRAV 1.015 03/17/2024   RBCUR negative 03/17/2024   PHUR 7.0 03/17/2024   PROTEINUR Negative 12/21/2023   UROBILINOGEN 0.2 03/17/2024   LEUKOCYTESUR Negative 03/17/2024    Post Void Residual - 03/17/24 0924       Post Void Residual   Post Void Residual 30 mL           BP 130/72   Pulse 80   LMP  (LMP Unknown)   CYSTOSCOPY: A time out was performed.  The periurethral area was prepped and draped in a sterile manner.  2% lidocaine  jetpack was inserted at the urethral meatus and the urethra and bladder visualized with A flexible cystourethroscope.  She had normal urethral coaptation and normal urethral mucosa.  She had normal bladder mucosa. She had bilateral clear efflux from both ureteral orifices.  She had no squamous metaplasia at the trigone, no trabeculations, cellules or diverticuli.    Patient voided post-procedure   Post Void Residual - 03/17/24 0924       Post Void Residual   Post Void Residual 30 mL            ASSESSMENT:  78 y.o. with recurrent UTI and lower urinary tract symptoms. Cystoscopy today is normal.  PLAN:  Follow-up to discuss findings and treatment options.  All questions answered and post-procedures instructions were given - provided information for PTNS for patient to consider - continue vaginal estrogen 1g twice a week - discontinued  methenamine , will not resume at this time due to lack of symptoms and reports GI side effects on medication.   Lianne ONEIDA Gillis, MD

## 2024-03-17 NOTE — Addendum Note (Signed)
 Addended by: KRYSTAL ANDREE GAILS on: 03/17/2024 12:55 PM   Modules accepted: Orders

## 2024-03-17 NOTE — Addendum Note (Signed)
 Addended by: KRYSTAL ANDREE GAILS on: 03/17/2024 10:40 AM   Modules accepted: Orders

## 2024-03-18 ENCOUNTER — Other Ambulatory Visit: Payer: Self-pay | Admitting: Family Medicine

## 2024-03-18 DIAGNOSIS — F419 Anxiety disorder, unspecified: Secondary | ICD-10-CM

## 2024-03-21 ENCOUNTER — Ambulatory Visit: Admitting: Physical Therapy

## 2024-03-21 DIAGNOSIS — M62838 Other muscle spasm: Secondary | ICD-10-CM | POA: Diagnosis not present

## 2024-03-21 DIAGNOSIS — R293 Abnormal posture: Secondary | ICD-10-CM | POA: Diagnosis not present

## 2024-03-21 DIAGNOSIS — M6281 Muscle weakness (generalized): Secondary | ICD-10-CM

## 2024-03-21 DIAGNOSIS — R279 Unspecified lack of coordination: Secondary | ICD-10-CM | POA: Diagnosis not present

## 2024-03-21 NOTE — Therapy (Signed)
 OUTPATIENT PHYSICAL THERAPY FEMALE PELVIC TREATMENT   Patient Name: Katrina Benitez MRN: [id number] DOB:September 04, 1946, 78 y.o., female Today's Date: 03/21/2024  END OF SESSION:  PT End of Session - 03/21/24 1537     Visit Number 6    Date for PT Re-Evaluation 03/28/24    Authorization Type MCR    Progress Note Due on Visit 16   recert sent on 6th visit   PT Start Time 1535    PT Stop Time 1628    PT Time Calculation (min) 53 min    Activity Tolerance Patient tolerated treatment well    Behavior During Therapy WFL for tasks assessed/performed          Past Medical History:  Diagnosis Date   Allergy    Anemia    Anxiety    Arthritis    degenerative disc disorder   Cancer (HCC)    skin cancer   Cardiac arrhythmia    Cataract    surgery in right eye in 03/2022   Diverticulitis    Diverticulosis    GERD (gastroesophageal reflux disease)    Heart murmur    HTN (hypertension)    not on meds in 2 years per pt on 05/19/22 n   Infertility, female    Neuromuscular disorder (HCC)    idiopathic peripheral  neuropathy   Peripheral neuropathy    Positive PPD    SIADH (syndrome of inappropriate ADH production) (HCC)    Past Surgical History:  Procedure Laterality Date   BREAST BIOPSY Left    BREAST EXCISIONAL BIOPSY Left    benign   CATARACT EXTRACTION Right 2023   CERVICAL BIOPSY  W/ LOOP ELECTRODE EXCISION     CERVICAL POLYPECTOMY  02/2016   CHOLECYSTECTOMY N/A 06/02/2022   Procedure: LAPAROSCOPIC CHOLECYSTECTOMY WITH INTRAOPERATIVE CHOLANGIOGRAM;  Surgeon: Eletha Boas, MD;  Location: [facility];  Service: General;  Laterality: N/A;   COLONOSCOPY     COLPOSCOPY     DILATION AND CURETTAGE OF UTERUS     HYSTEROSCOPY     LOOP RECORDER IMPLANT     LUMBAR DISC SURGERY     LUMBAR LAMINECTOMY     TONSILLECTOMY     UPPER GASTROINTESTINAL ENDOSCOPY     Patient Active Problem List   Diagnosis Date Noted   Acute bilateral low back pain without sciatica 02/28/2024   Hyponatremia  02/28/2024   Recurrent UTI 02/28/2024   Bladder pain 12/10/2023   Urge urinary incontinence 12/10/2023   History of recurrent UTI (urinary tract infection) 12/10/2023   Vaginal atrophy 12/10/2023   Constipation 12/10/2023   Prediabetes 07/30/2023   Medication management 01/08/2023   Abdominal pain, right upper quadrant 05/18/2022   Symptomatic cholelithiasis 05/18/2022   Syncope and collapse 03/05/2022   Paroxysmal atrial fibrillation (HCC) 01/27/2022   Increased risk of breast cancer 04/18/2021   Anemia 01/09/2021   Pruritus 11/14/2019   Depression, recurrent (HCC) 02/18/2019   Primary insomnia 02/18/2019   Atrial fibrillation with RVR (HCC) 01/19/2019   Hyperlipidemia 09/30/2018   Post herpetic neuralgia 08/12/2018   Idiopathic peripheral neuropathy 08/12/2018   Vitamin D  deficiency 07/22/2018   Vulvar pruritus 04/16/2016   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   Opacity noted on imaging study    Chest pain 01/21/2016   SIADH (syndrome of inappropriate ADH production) (HCC) 01/21/2016   Hypertension 01/21/2016   Anxiety 01/21/2016   GERD (gastroesophageal reflux disease) 01/21/2016   Gait abnormality 12/12/2015    PCP: Alvia Corean CROME, FNP  REFERRING PROVIDER: Alvia Corean CROME,  FNP  REFERRING DIAG: M62.89 (ICD-10-CM) - Pelvic floor dysfunction in female  THERAPY DIAG:  Muscle weakness (generalized) - Plan: PT plan of care cert/re-cert  Other muscle spasm - Plan: PT plan of care cert/re-cert  Unspecified lack of coordination - Plan: PT plan of care cert/re-cert  Abnormal posture - Plan: PT plan of care cert/re-cert  Rationale for Evaluation and Treatment: Rehabilitation  ONSET DATE: 3 years  SUBJECTIVE:                                                                                                                                                                                           SUBJECTIVE STATEMENT: Had a normal Cystoscopy  The loss of control has  certainly gotten better, has had more several days of thinking the loss of control was gone but then has an instance of loss of urine or bowels (less often than urine). Bladder spasms have gotten but when it happens usually at night with urge to empty.  PAIN:  None at this time. But at night: Are you having pain? Yes during night NPRS scale: 10/10 Pain location: low/mid back  Pain type: stiffness Pain description:intermittent  Aggravating factors: sitting, lying down Relieving factors: stretching and mobility  PRECAUTIONS: None  RED FLAGS: None   WEIGHT BEARING RESTRICTIONS: No  FALLS:  Has patient fallen in last 6 months? No  OCCUPATION: retired - physician   ACTIVITY LEVEL : lower   PLOF: Independent  PATIENT GOALS: to have less leakage  PERTINENT HISTORY:  Vulvodynia, Vulvar pruritus, Anxiety Sexual abuse: No  BOWEL MOVEMENT: Pain with bowel movement: Yes Type of bowel movement:Type (Bristol Stool Scale) vary, Frequency every day, and Strain yes Fully empty rectum: No Leakage: No Pads: No Fiber supplement/laxative miralax   URINATION: Pain with urination: Yes Fully empty bladder: No Stream: Strong and Weak Urgency: Yes  Frequency: sometimes every two hours; 1-3x nightly Leakage: Urge to void and Walking to the bathroom Pads: No  INTERCOURSE:  Ability to have vaginal penetration Yes  Pain with intercourse: none DrynessYes  Climax: not active   PREGNANCY: Vaginal deliveries 0  C-section deliveries 0   PROLAPSE: None   OBJECTIVE:  Note: Objective measures were completed at Evaluation unless otherwise noted.  DIAGNOSTIC FINDINGS:    COGNITION: Overall cognitive status: Within functional limits for tasks assessed     SENSATION: Light touch: Appears intact   FUNCTIONAL TESTS:  Functional squat - descent limited by 25%  GAIT: WFL  POSTURE: rounded shoulders, forward head, and posterior pelvic tilt   LUMBARAROM/PROM:  A/PROM  A/PROM  eval  Flexion WFL  Extension WFL  Right lateral  flexion Limited by 25%  Left lateral flexion Limited by 25%  Right rotation Limited by 25%  Left rotation Limited by 25%   (Blank rows = not tested)  LOWER EXTREMITY ROM:  Bil hamstrings and adductors limited by 25%  LOWER EXTREMITY MMT:  Bil hips grossly 4/5; knees 4+/5 PALPATION:   General: tightness at bil lumbar paraspinals   Pelvic Alignment: WFL  Abdominal: tightness noted in abdominal lower quadrants but no TTP                External Perineal Exam: dryness noted at vulva, age related changes noted.                              Internal Pelvic Floor: mild TTP at bil obturators, tightness noted throughout deep and superficial layers  Patient confirms identification and approves PT to assess internal pelvic floor and treatment Yes No emotional/communication barriers or cognitive limitation. Patient is motivated to learn. Patient understands and agrees with treatment goals and plan. PT explains patient will be examined in standing, sitting, and lying down to see how their muscles and joints work. When they are ready, they will be asked to remove their underwear so PT can examine their perineum. The patient is also given the option of providing their own chaperone as one is not provided in our facility. The patient also has the right and is explained the right to defer or refuse any part of the evaluation or treatment including the internal exam. With the patient's consent, PT will use one gloved finger to gently assess the muscles of the pelvic floor, seeing how well it contracts and relaxes and if there is muscle symmetry. After, the patient will get dressed and PT and patient will discuss exam findings and plan of care. PT and patient discuss plan of care, schedule, attendance policy and HEP activities.  PELVIC MMT:   MMT eval  Vaginal 3/5 (but tightness throughout and decreased ability to relax pelvic floor)  Internal  Anal Sphincter   External Anal Sphincter   Puborectalis   Diastasis Recti   (Blank rows = not tested)        TONE: Increased throughout   PROLAPSE:  Not seen TODAY'S TREATMENT:                                                                                                                              DATE:    02/25/24: Trigger Point Dry Needling  Initial Treatment: Pt instructed on Dry Needling rational, procedures, and possible side effects. Pt instructed to expect mild to moderate muscle soreness later in the day and/or into the next day.  Pt instructed in methods to reduce muscle soreness. Pt instructed to continue prescribed HEP. Patient was educated on signs and symptoms of infection and other risk factors and advised to seek medical attention should they occur.  Patient verbalized understanding of  these instructions and education.   Patient Verbal Consent Given: Yes Education Handout Provided: Yes Muscles Treated: right and left glute max Electrical Stimulation Performed: No Treatment Response/Outcome: dense muscle gripping with twitch, good release noted. Decreased palpable tension and TTP. Improved lumbar extension in standing. No bleeding, pt denied pain.    02/29/24: Nustep 6 mins L6 PT present to discuss progress Single knee to chest 3x30s Lumbar rotations 2x10 Butterfly stretch 3x30s Manual - pt in supine, fascial release in lower and LT abdominal quadrants X10 diaphragmatic breathing in supine cues for belly mobility and decreased abdominal bracing Vibration plate low setting standing 1 min static standing, 1 min forward hinge with bil UE support  03/21/24: Pt had several questions about internal treatment, entire process explained with one gloved digit in vagina with gentle palpation throughout superficial and deep bil pelvic floor layers for assessing if tension is present and if pt has TTP. Pt consented. Patient consented to internal pelvic floor treatment  vaginally this date and found to have tension at anterior and posterior pelvic floor slightly more so at anterior. Manual here for improved mobility and decreased burning. Also externally at clitoris pt found to have to tension in rotational directions and upward with gentle mobility completed which pt reports replicates burning pain. Manual here with bimanual at vaginal downward stretch completed with improved mobility noted Pt educated on tension and gentle stretching completed throughout treatment, pt had questions about HEP and POC and all answered. Pt requests additional visits scheduled due to her improvement in symptoms but not resolution.   PATIENT EDUCATION:  Education details: [id number], fiber types Person educated: Patient Education method: Explanation, Demonstration, Tactile cues, Verbal cues, and Handouts Education comprehension: verbalized understanding, returned demonstration, verbal cues required, tactile cues required, and needs further education  HOME EXERCISE PROGRAM: [id number]  ASSESSMENT:  CLINICAL IMPRESSION: Patient is a 78 y.o. female  who was seen today for physical therapy treatment for pelvic floor tension, urinary burning and pain, and constipation. Pt reports she has been seeing improvement with urine and bowel symptoms with bowels much less bothersome now. Pt reports she is only having leakage intermittently not consistently now, is having burning pain on/off usually with strong urge to urination. Manual today focused on this with pt have tension and good release noted today. Pt educated on this and would benefit from additional PT to further address deficits.    OBJECTIVE IMPAIRMENTS: decreased activity tolerance, decreased coordination, decreased knowledge of condition, decreased mobility, decreased strength, increased fascial restrictions, increased muscle spasms, impaired flexibility, improper body mechanics, and postural dysfunction.   ACTIVITY LIMITATIONS:  continence and locomotion level  PARTICIPATION LIMITATIONS: community activity  PERSONAL FACTORS: Time since onset of injury/illness/exacerbation are also affecting patient's functional outcome.   REHAB POTENTIAL: Good  CLINICAL DECISION MAKING: Stable/uncomplicated  EVALUATION COMPLEXITY: Low   GOALS: Goals reviewed with patient? Yes  SHORT TERM GOALS: Target date: 01/25/23  Pt to be I with HEP.  Baseline: Goal status: MET  2.  Pt to be I with relaxation techniques for decreased strain at pelvic floor. Baseline:  Goal status: MET  3.  Pt will be independent with use of squatty potty, relaxed toileting mechanics, and improved bowel movement techniques in order to increase ease of bowel movements and complete evacuation.   Baseline:  Goal status: MET  4.  Pt will be able to teach back and utilize urge suppression technique in order to help reduce number of trips to the bathroom.    Baseline:  Goal status: MET  LONG TERM GOALS: Target date: 03/28/24  Pt to be I with advanced HEP.  Baseline:  Goal status: on going  2.  Pt to report no more than 1 instance of urinary incontinence in a week due to improved pelvic floor tissue mobility and I with urge suppression techniques.  Baseline:  Goal status: on going  3.  Pt to report no fecal leakage in at least 1 month due to improved evacuation habits.  Baseline:  Goal status: on going  4.  Pt to demonstrate no restrictions at lumbar spine and bil hips for decreased strain at pelvic floor.  Baseline:  Goal status: on going  5.  Pt to demonstrate at least 4/5 pelvic floor strength and ability to fully relax after each contraction for improved pelvic stability and decreased strain at pelvic floor/ decrease leakage.  Baseline:  Goal status: on going  6.  Pt to demonstrate improved coordination of pelvic floor and breathing mechanics with 10# squat with appropriate synergistic patterns to decrease pain and leakage at least 75% of  the time.    Baseline:  Goal status: on going  PLAN:  PT FREQUENCY: 1-2x/week  PT DURATION: 15 sessions  PLANNED INTERVENTIONS: 97110-Therapeutic exercises, 97530- Therapeutic activity, 97112- Neuromuscular re-education, 97535- Self Care, [id number]- Manual therapy, Patient/Family education, Taping, Dry Needling, Joint mobilization, Spinal mobilization, Scar mobilization, DME instructions, Cryotherapy, Moist heat, and Biofeedback  PLAN FOR NEXT SESSION: internal as needed, hip and trunk stretches, relaxation techniques    Darryle Navy, PT, DPT 06/30/254:40 PM

## 2024-03-22 NOTE — Progress Notes (Signed)
 Carelink Summary Report / Loop Recorder

## 2024-03-22 NOTE — Addendum Note (Signed)
 Addended by: TAWNI DRILLING D on: 03/22/2024 05:42 PM   Modules accepted: Orders

## 2024-03-23 ENCOUNTER — Ambulatory Visit: Attending: Obstetrics | Admitting: Physical Therapy

## 2024-03-23 DIAGNOSIS — M62838 Other muscle spasm: Secondary | ICD-10-CM | POA: Diagnosis not present

## 2024-03-23 DIAGNOSIS — M6281 Muscle weakness (generalized): Secondary | ICD-10-CM | POA: Insufficient documentation

## 2024-03-23 DIAGNOSIS — R293 Abnormal posture: Secondary | ICD-10-CM | POA: Insufficient documentation

## 2024-03-23 DIAGNOSIS — R279 Unspecified lack of coordination: Secondary | ICD-10-CM | POA: Insufficient documentation

## 2024-03-23 NOTE — Therapy (Signed)
 OUTPATIENT PHYSICAL THERAPY FEMALE PELVIC TREATMENT   Patient Name: Katrina Benitez MRN: [id number] DOB:1946/01/08, 78 y.o., female Today's Date: 03/23/2024  END OF SESSION:  PT End of Session - 03/23/24 1626     Visit Number 7    Date for PT Re-Evaluation 03/28/24    Authorization Type MCR    Progress Note Due on Visit 16   recert sent on 6th visit   PT Start Time 1618    PT Stop Time 1656    PT Time Calculation (min) 38 min    Activity Tolerance Patient tolerated treatment well    Behavior During Therapy WFL for tasks assessed/performed           Past Medical History:  Diagnosis Date   Allergy    Anemia    Anxiety    Arthritis    degenerative disc disorder   Cancer (HCC)    skin cancer   Cardiac arrhythmia    Cataract    surgery in right eye in 03/2022   Diverticulitis    Diverticulosis    GERD (gastroesophageal reflux disease)    Heart murmur    HTN (hypertension)    not on meds in 2 years per pt on 05/19/22 n   Infertility, female    Neuromuscular disorder (HCC)    idiopathic peripheral  neuropathy   Peripheral neuropathy    Positive PPD    SIADH (syndrome of inappropriate ADH production) (HCC)    Past Surgical History:  Procedure Laterality Date   BREAST BIOPSY Left    BREAST EXCISIONAL BIOPSY Left    benign   CATARACT EXTRACTION Right 2023   CERVICAL BIOPSY  W/ LOOP ELECTRODE EXCISION     CERVICAL POLYPECTOMY  02/2016   CHOLECYSTECTOMY N/A 06/02/2022   Procedure: LAPAROSCOPIC CHOLECYSTECTOMY WITH INTRAOPERATIVE CHOLANGIOGRAM;  Surgeon: Eletha Boas, MD;  Location: [facility];  Service: General;  Laterality: N/A;   COLONOSCOPY     COLPOSCOPY     DILATION AND CURETTAGE OF UTERUS     HYSTEROSCOPY     LOOP RECORDER IMPLANT     LUMBAR DISC SURGERY     LUMBAR LAMINECTOMY     TONSILLECTOMY     UPPER GASTROINTESTINAL ENDOSCOPY     Patient Active Problem List   Diagnosis Date Noted   Acute bilateral low back pain without sciatica 02/28/2024   Hyponatremia  02/28/2024   Recurrent UTI 02/28/2024   Bladder pain 12/10/2023   Urge urinary incontinence 12/10/2023   History of recurrent UTI (urinary tract infection) 12/10/2023   Vaginal atrophy 12/10/2023   Constipation 12/10/2023   Prediabetes 07/30/2023   Medication management 01/08/2023   Abdominal pain, right upper quadrant 05/18/2022   Symptomatic cholelithiasis 05/18/2022   Syncope and collapse 03/05/2022   Paroxysmal atrial fibrillation (HCC) 01/27/2022   Increased risk of breast cancer 04/18/2021   Anemia 01/09/2021   Pruritus 11/14/2019   Depression, recurrent (HCC) 02/18/2019   Primary insomnia 02/18/2019   Atrial fibrillation with RVR (HCC) 01/19/2019   Hyperlipidemia 09/30/2018   Post herpetic neuralgia 08/12/2018   Idiopathic peripheral neuropathy 08/12/2018   Vitamin D  deficiency 07/22/2018   Vulvar pruritus 04/16/2016   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   Opacity noted on imaging study    Chest pain 01/21/2016   SIADH (syndrome of inappropriate ADH production) (HCC) 01/21/2016   Hypertension 01/21/2016   Anxiety 01/21/2016   GERD (gastroesophageal reflux disease) 01/21/2016   Gait abnormality 12/12/2015    PCP: Alvia Corean CROME, FNP  REFERRING PROVIDER: Alvia Corean  L, FNP  REFERRING DIAG: M62.89 (ICD-10-CM) - Pelvic floor dysfunction in female  THERAPY DIAG:  Muscle weakness (generalized)  Unspecified lack of coordination  Abnormal posture  Rationale for Evaluation and Treatment: Rehabilitation  ONSET DATE: 3 years  SUBJECTIVE:                                                                                                                                                                                           SUBJECTIVE STATEMENT: Does report no burning since Monday's appointment, no longer getting up at night and has been able to make it to bathroom in the morning. No leakage either.   PAIN:  None at this time. But at night: Are you having pain? Yes  during night NPRS scale: 10/10 Pain location: low/mid back  Pain type: stiffness Pain description:intermittent  Aggravating factors: sitting, lying down Relieving factors: stretching and mobility  PRECAUTIONS: None  RED FLAGS: None   WEIGHT BEARING RESTRICTIONS: No  FALLS:  Has patient fallen in last 6 months? No  OCCUPATION: retired - physician   ACTIVITY LEVEL : lower   PLOF: Independent  PATIENT GOALS: to have less leakage  PERTINENT HISTORY:  Vulvodynia, Vulvar pruritus, Anxiety Sexual abuse: No  BOWEL MOVEMENT: Pain with bowel movement: Yes Type of bowel movement:Type (Bristol Stool Scale) vary, Frequency every day, and Strain yes Fully empty rectum: No Leakage: No Pads: No Fiber supplement/laxative miralax   URINATION: Pain with urination: Yes Fully empty bladder: No Stream: Strong and Weak Urgency: Yes  Frequency: sometimes every two hours; 1-3x nightly Leakage: Urge to void and Walking to the bathroom Pads: No  INTERCOURSE:  Ability to have vaginal penetration Yes  Pain with intercourse: none DrynessYes  Climax: not active   PREGNANCY: Vaginal deliveries 0  C-section deliveries 0   PROLAPSE: None   OBJECTIVE:  Note: Objective measures were completed at Evaluation unless otherwise noted.  DIAGNOSTIC FINDINGS:    COGNITION: Overall cognitive status: Within functional limits for tasks assessed     SENSATION: Light touch: Appears intact   FUNCTIONAL TESTS:  Functional squat - descent limited by 25%  GAIT: WFL  POSTURE: rounded shoulders, forward head, and posterior pelvic tilt   LUMBARAROM/PROM:  A/PROM A/PROM  eval  Flexion WFL  Extension WFL  Right lateral flexion Limited by 25%  Left lateral flexion Limited by 25%  Right rotation Limited by 25%  Left rotation Limited by 25%   (Blank rows = not tested)  LOWER EXTREMITY ROM:  Bil hamstrings and adductors limited by 25%  LOWER EXTREMITY MMT:  Bil hips  grossly 4/5; knees 4+/5 PALPATION:   General: tightness  at bil lumbar paraspinals   Pelvic Alignment: WFL  Abdominal: tightness noted in abdominal lower quadrants but no TTP                External Perineal Exam: dryness noted at vulva, age related changes noted.                              Internal Pelvic Floor: mild TTP at bil obturators, tightness noted throughout deep and superficial layers  Patient confirms identification and approves PT to assess internal pelvic floor and treatment Yes No emotional/communication barriers or cognitive limitation. Patient is motivated to learn. Patient understands and agrees with treatment goals and plan. PT explains patient will be examined in standing, sitting, and lying down to see how their muscles and joints work. When they are ready, they will be asked to remove their underwear so PT can examine their perineum. The patient is also given the option of providing their own chaperone as one is not provided in our facility. The patient also has the right and is explained the right to defer or refuse any part of the evaluation or treatment including the internal exam. With the patient's consent, PT will use one gloved finger to gently assess the muscles of the pelvic floor, seeing how well it contracts and relaxes and if there is muscle symmetry. After, the patient will get dressed and PT and patient will discuss exam findings and plan of care. PT and patient discuss plan of care, schedule, attendance policy and HEP activities.  PELVIC MMT:   MMT eval  Vaginal 3/5 (but tightness throughout and decreased ability to relax pelvic floor)  Internal Anal Sphincter   External Anal Sphincter   Puborectalis   Diastasis Recti   (Blank rows = not tested)        TONE: Increased throughout   PROLAPSE:  Not seen TODAY'S TREATMENT:                                                                                                                              DATE:     02/25/24: Trigger Point Dry Needling  Initial Treatment: Pt instructed on Dry Needling rational, procedures, and possible side effects. Pt instructed to expect mild to moderate muscle soreness later in the day and/or into the next day.  Pt instructed in methods to reduce muscle soreness. Pt instructed to continue prescribed HEP. Patient was educated on signs and symptoms of infection and other risk factors and advised to seek medical attention should they occur.  Patient verbalized understanding of these instructions and education.   Patient Verbal Consent Given: Yes Education Handout Provided: Yes Muscles Treated: right and left glute max Electrical Stimulation Performed: No Treatment Response/Outcome: dense muscle gripping with twitch, good release noted. Decreased palpable tension and TTP. Improved lumbar extension in standing. No bleeding, pt denied pain.    02/29/24: Nustep 6 mins  L6 PT present to discuss progress Single knee to chest 3x30s Lumbar rotations 2x10 Butterfly stretch 3x30s Manual - pt in supine, fascial release in lower and LT abdominal quadrants X10 diaphragmatic breathing in supine cues for belly mobility and decreased abdominal bracing Vibration plate low setting standing 1 min static standing, 1 min forward hinge with bil UE support  03/21/24: Pt had several questions about internal treatment, entire process explained with one gloved digit in vagina with gentle palpation throughout superficial and deep bil pelvic floor layers for assessing if tension is present and if pt has TTP. Pt consented. Patient consented to internal pelvic floor treatment vaginally this date and found to have tension at anterior and posterior pelvic floor slightly more so at anterior. Manual here for improved mobility and decreased burning. Also externally at clitoris pt found to have to tension in rotational directions and upward with gentle mobility completed which pt reports replicates  burning pain. Manual here with bimanual at vaginal downward stretch completed with improved mobility noted Pt educated on tension and gentle stretching completed throughout treatment, pt had questions about HEP and POC and all answered. Pt requests additional visits scheduled due to her improvement in symptoms but not resolution.   03/23/24: Pt educated on voiding mechanics with squatty potty, abdominal massage continuation, and exhale and pelvic floor relaxation instead of pushing, pelvic mobility at toilet to improve output.  Pelvic floor relaxation video educated on Vibration plate 1 min standing low setting, 2x1 min sitting low setting for improved pelvic floor relaxation for improved voiding.  Pt also educated on possible benefits from additional internal pelvic floor treatment for improved mobility as pt reported improved after last session. However pt declined today reporting feeling full with bladder and bowel.   PATIENT EDUCATION:  Education details: [id number], fiber types Person educated: Patient Education method: Explanation, Demonstration, Tactile cues, Verbal cues, and Handouts Education comprehension: verbalized understanding, returned demonstration, verbal cues required, tactile cues required, and needs further education  HOME EXERCISE PROGRAM: [id number]  ASSESSMENT:  CLINICAL IMPRESSION: Patient is a 78 y.o. female  who was seen today for physical therapy treatment for pelvic floor tension, urinary burning and pain, and constipation. Pt reports she has been seeing improvement with urine and bowel symptoms with bowels much less bothersome now. Session focused on bowel habits and mechanics and relaxation techniques for improved bladder and bowel emptying and to continue to decreased urgency and frequency. Pt educated on this and would benefit from additional PT to further address deficits.    OBJECTIVE IMPAIRMENTS: decreased activity tolerance, decreased coordination, decreased  knowledge of condition, decreased mobility, decreased strength, increased fascial restrictions, increased muscle spasms, impaired flexibility, improper body mechanics, and postural dysfunction.   ACTIVITY LIMITATIONS: continence and locomotion level  PARTICIPATION LIMITATIONS: community activity  PERSONAL FACTORS: Time since onset of injury/illness/exacerbation are also affecting patient's functional outcome.   REHAB POTENTIAL: Good  CLINICAL DECISION MAKING: Stable/uncomplicated  EVALUATION COMPLEXITY: Low   GOALS: Goals reviewed with patient? Yes  SHORT TERM GOALS: Target date: 01/25/23  Pt to be I with HEP.  Baseline: Goal status: MET  2.  Pt to be I with relaxation techniques for decreased strain at pelvic floor. Baseline:  Goal status: MET  3.  Pt will be independent with use of squatty potty, relaxed toileting mechanics, and improved bowel movement techniques in order to increase ease of bowel movements and complete evacuation.   Baseline:  Goal status: MET  4.  Pt will be able to teach back  and utilize urge suppression technique in order to help reduce number of trips to the bathroom.    Baseline:  Goal status: MET  LONG TERM GOALS: Target date: 03/28/24  Pt to be I with advanced HEP.  Baseline:  Goal status: on going  2.  Pt to report no more than 1 instance of urinary incontinence in a week due to improved pelvic floor tissue mobility and I with urge suppression techniques.  Baseline:  Goal status: on going  3.  Pt to report no fecal leakage in at least 1 month due to improved evacuation habits.  Baseline:  Goal status: on going  4.  Pt to demonstrate no restrictions at lumbar spine and bil hips for decreased strain at pelvic floor.  Baseline:  Goal status: on going  5.  Pt to demonstrate at least 4/5 pelvic floor strength and ability to fully relax after each contraction for improved pelvic stability and decreased strain at pelvic floor/ decrease leakage.   Baseline:  Goal status: on going  6.  Pt to demonstrate improved coordination of pelvic floor and breathing mechanics with 10# squat with appropriate synergistic patterns to decrease pain and leakage at least 75% of the time.    Baseline:  Goal status: on going  PLAN:  PT FREQUENCY: 1-2x/week  PT DURATION: 15 sessions  PLANNED INTERVENTIONS: 97110-Therapeutic exercises, 97530- Therapeutic activity, 97112- Neuromuscular re-education, 97535- Self Care, [id number]- Manual therapy, Patient/Family education, Taping, Dry Needling, Joint mobilization, Spinal mobilization, Scar mobilization, DME instructions, Cryotherapy, Moist heat, and Biofeedback  PLAN FOR NEXT SESSION: internal as needed, hip and trunk stretches, relaxation techniques    Darryle Navy, PT, DPT 07/02/255:05 PM

## 2024-03-23 NOTE — Patient Instructions (Signed)
[email or web address]   

## 2024-03-28 ENCOUNTER — Ambulatory Visit: Admitting: Physical Therapy

## 2024-03-31 ENCOUNTER — Other Ambulatory Visit: Admitting: Obstetrics

## 2024-04-04 ENCOUNTER — Ambulatory Visit: Payer: Self-pay | Admitting: Internal Medicine

## 2024-04-04 ENCOUNTER — Ambulatory Visit

## 2024-04-04 DIAGNOSIS — I480 Paroxysmal atrial fibrillation: Secondary | ICD-10-CM

## 2024-04-04 LAB — CUP PACEART REMOTE DEVICE CHECK
Date Time Interrogation Session: 20250713233917
Implantable Pulse Generator Implant Date: 20230614

## 2024-04-05 ENCOUNTER — Other Ambulatory Visit: Payer: Self-pay | Admitting: Family Medicine

## 2024-04-06 ENCOUNTER — Other Ambulatory Visit: Payer: Self-pay | Admitting: Family Medicine

## 2024-04-06 DIAGNOSIS — G629 Polyneuropathy, unspecified: Secondary | ICD-10-CM

## 2024-04-06 NOTE — Telephone Encounter (Signed)
   Copied from CRM [id number]. Topic: Clinical - Red Word Triage >> Apr 06, 2024  4:41 PM Jayma L wrote: Red Word that prompted transfer to Nurse Triage: patient stated she is having nerve pain and chest pain , asking for her refill of gabapentin  600mg s to be rushed. Having these issues because she's without her medicine.

## 2024-04-11 ENCOUNTER — Ambulatory Visit: Payer: Medicare Other

## 2024-04-12 ENCOUNTER — Encounter: Payer: Self-pay | Admitting: Physical Therapy

## 2024-04-13 ENCOUNTER — Ambulatory Visit: Attending: Cardiovascular Disease | Admitting: Cardiovascular Disease

## 2024-04-13 ENCOUNTER — Encounter: Payer: Self-pay | Admitting: Cardiovascular Disease

## 2024-04-13 VITALS — BP 126/72 | HR 71 | Ht 64.75 in | Wt 183.0 lb

## 2024-04-13 DIAGNOSIS — I480 Paroxysmal atrial fibrillation: Secondary | ICD-10-CM | POA: Insufficient documentation

## 2024-04-13 NOTE — Assessment & Plan Note (Signed)
 The patient has a low atrial fibrillation burden.  She had about 6 or 7 hours of atrial fibrillation in the last month.  I reviewed her previous transmissions demonstrating rare episodes of brief atrial tachycardia/atrial fibrillation.  Overall she seems to be doing very well.  We discussed the ongoing risk/benefit of continuing chronic oral anticoagulation.  She appears clinically stable with no contraindication to oral anticoagulation.  She is not having frequent falls, nor is she having any bleeding problems.  Her CHA2DS2-VASc score is 3.  I will see her back in 1 year for follow-up evaluation.

## 2024-04-13 NOTE — Patient Instructions (Signed)
 Follow-Up: At [facility], you and your health needs are our priority.  As part of our continuing mission to provide you with exceptional heart care, our providers are all part of one team.  This team includes your primary Cardiologist (physician) and Advanced Practice Providers or APPs (Physician Assistants and Nurse Practitioners) who all work together to provide you with the care you need, when you need it.  Your next appointment:   1 year(s)  Provider:   Arnoldo Lapping, MD

## 2024-04-13 NOTE — Progress Notes (Signed)
 Cardiology Office Note:    Date:  04/13/2024   ID:  Katrina Benitez, Katrina Benitez [birth year], MRN [id number]  PCP:  Alvia Corean CROME, FNP   [facility] Providers Cardiologist:  Ozell Fell, MD     Referring MD: Alvia Corean CROME, *   Chief Complaint  Patient presents with   Atrial Fibrillation    History of Present Illness:    Katrina Benitez is a 78 y.o. female with a hx of atrial fibrillation, presenting for follow-up evaluation.  Patient is anticoagulated with apixaban .  The patient is here with her husband today.  She has had no recent problems with heart palpitations.  She continues to have issues with gait instability but has not had any recent falls.  She has no history of head injury.  No chest pain, chest pressure, or shortness of breath.  She has chronic lower extremity edema related to lymphedema and she uses the lounge doctor device as well as compression pumps.  She denies any bleeding problems on apixaban  and is fully compliant.   Current Medications: Current Meds  Medication Sig   Alum Hydroxide-Mag Carbonate (GAVISCON EXTRA STRENGTH) 160-105 MG CHEW Chew 2 tablets by mouth daily as needed (upset stomach).   betamethasone  valerate (VALISONE ) 0.1 % cream APPLY TWICE DAILY   betamethasone  valerate ointment (VALISONE ) 0.1 % Apply 1 Application topically 2 (two) times daily as needed (itching).   Cholecalciferol 50 MCG (2000 UT) CAPS Take 4,000 Units by mouth daily.   Cyanocobalamin  (VITAMIN B 12 PO) Take by mouth.   diazepam  (VALIUM ) 10 MG tablet TAKE ONE TABLET EVERY TWELVE HOURS AS NEEDED FOR ANXIETY   diphenoxylate -atropine  (LOMOTIL ) 2.5-0.025 MG tablet TAKE ONE (1) TABLET FOUR TIMES EACH DAY AS NEEDED FOR DIARRHEA OR LOOSE STOOLS   ELIQUIS  5 MG TABS tablet TAKE ONE TABLET BY MOUTH TWICE DAILY   estradiol  (ESTRACE ) 0.1 MG/GM vaginal cream 1 gram vaginally nightly x 7 - 10 days and then twice weekly   furosemide  (LASIX ) 20 MG tablet Take 1 tablet (20 mg  total) by mouth daily.   gabapentin  (NEURONTIN ) 600 MG tablet TAKE ONE TABLET BY MOUTH THREE TIMES DAILY   hydrOXYzine  (VISTARIL ) 25 MG capsule Two capsules 4 times daily   lidocaine  (LMX) 4 % cream Use 0.5g peasize up to 3 times a day as needed for pain   methenamine  (HIPREX ) 1 g tablet Take 1 tablet (1 g total) by mouth 2 (two) times daily with a meal.   mupirocin  ointment (BACTROBAN ) 2 % Apply 1 Application topically 2 (two) times daily.   omeprazole  (PRILOSEC) 40 MG capsule TAKE ONE CAPSULE EACH DAY   ondansetron  (ZOFRAN -ODT) 8 MG disintegrating tablet TAKE ONE TABLET EVERY EIGHT HOURS AS NEEDED FOR NAUSEA AND VOMITING   Polyethyl Glyc-Propyl Glyc PF (SYSTANE HYDRATION PF) 0.4-0.3 % SOLN Place 1 drop into both eyes daily as needed (dry eyes).   Pramoxine HCl (CERAVE ITCH RELIEF) 1 % CREA Apply 1 Application topically daily as needed (itching).   Probiotic Product (ALIGN PO) Take 1 capsule by mouth daily.   sodium chloride  1 g tablet Take 1 g by mouth 2 (two) times daily with a meal.   Thiamine  HCl (VITAMIN B1 PO) Take by mouth.   triamcinolone  cream (KENALOG ) 0.1 % Apply 1 Application topically as needed (itching).   zolpidem  (AMBIEN ) 10 MG tablet TAKE ONE TABLET BY MOUTH AT BEDTIME AS NEEDED FOR SLEEP     Allergies:   Flagyl [metronidazole], Hydromorphone, Iodixanol, Fentanyl , Cipro [ciprofloxacin  hcl], Dilaudid [hydromorphone hcl], and Ivp dye [iodinated contrast media]   ROS:   Please see the history of present illness.    All other systems reviewed and are negative.  EKGs/Labs/Other Studies Reviewed:    The following studies were reviewed today: Cardiac Studies & Procedures   ______________________________________________________________________________________________   STRESS TESTS  NM MYOCAR MULTI W/SPECT W 01/22/2016  Narrative CLINICAL DATA:  Chest pain, cardiac arrhythmia, heart murmur  EXAM: MYOCARDIAL IMAGING WITH SPECT (REST AND PHARMACOLOGIC-STRESS)  GATED LEFT  VENTRICULAR WALL MOTION STUDY  LEFT VENTRICULAR EJECTION FRACTION  TECHNIQUE: Standard myocardial SPECT imaging was performed after resting intravenous injection of 10 mCi Tc-14m sestamibi. Subsequently, intravenous infusion of Lexiscan  was performed under the supervision of the Cardiology staff. At peak effect of the drug, 30 mCi Tc-5m sestamibi was injected intravenously and standard myocardial SPECT imaging was performed. Quantitative gated imaging was also performed to evaluate left ventricular wall motion, and estimate left ventricular ejection fraction.  COMPARISON:  None.  FINDINGS: Perfusion: No decreased activity in the left ventricle on stress imaging to suggest reversible ischemia or infarction.  Wall Motion: Normal left ventricular wall motion. No left ventricular dilation.  Left Ventricular Ejection Fraction: 66 %  End diastolic volume 81 ml  End systolic volume 28 ml  IMPRESSION: 1. No reversible ischemia or infarction.  2. Normal left ventricular wall motion.  3. Left ventricular ejection fraction 66%  4. Low-risk stress test findings*.  *2012 Appropriate Use Criteria for Coronary Revascularization Focused Update: J Am Coll Cardiol. 2012;59(9):857-881. http://content.dementiazones.com.aspx?articleid=1201161   Electronically Signed By: Pinkie Pebbles M.D. On: 01/22/2016 13:29   ECHOCARDIOGRAM  ECHOCARDIOGRAM COMPLETE 02/15/2021  Narrative ECHOCARDIOGRAM REPORT    Patient Name:   Katrina Benitez     Date of Exam: 02/15/2021 Medical Rec #:  [id number]     Height:       66.0 in Accession #:    [id number]    Weight:       190.0 lb Date of Birth:  1945/11/25      BSA:          1.957 m Patient Age:    74 years      BP:           130/76 mmHg Patient Gender: F             HR:           83 bpm. Exam Location:  [address]  Procedure: 2D Echo, 3D Echo, Cardiac Doppler and Color Doppler  Indications:    R55 Syncope  History:        Patient has  prior history of Echocardiogram examinations, most recent 01/21/2019. Arrythmias:Atrial Fibrillation, Signs/Symptoms:Syncope and Murmur; Risk Factors:Family History of Coronary Artery Disease and Hypertension.  Sonographer:    Heather Hawks RDCS Referring Phys: [phone] Shadi Larner  IMPRESSIONS   1. Left ventricular ejection fraction, by estimation, is 60 to 65%. The left ventricle has normal function. The left ventricle has no regional wall motion abnormalities. Left ventricular diastolic parameters were normal. 2. Right ventricular systolic function is normal. The right ventricular size is normal. There is normal pulmonary artery systolic pressure. 3. The mitral valve is normal in structure. Trivial mitral valve regurgitation. No evidence of mitral stenosis. 4. The aortic valve is tricuspid. Aortic valve regurgitation is mild. Mild aortic valve sclerosis is present, with no evidence of aortic valve stenosis. 5. Aortic dilatation noted. There is mild dilatation of the ascending aorta, measuring 40 mm. 6. The inferior vena  cava is normal in size with greater than 50% respiratory variability, suggesting right atrial pressure of 3 mmHg.  FINDINGS Left Ventricle: Left ventricular ejection fraction, by estimation, is 60 to 65%. The left ventricle has normal function. The left ventricle has no regional wall motion abnormalities. The left ventricular internal cavity size was normal in size. There is no left ventricular hypertrophy. Left ventricular diastolic parameters were normal.  Right Ventricle: The right ventricular size is normal.Right ventricular systolic function is normal. There is normal pulmonary artery systolic pressure. The tricuspid regurgitant velocity is 2.33 m/s, and with an assumed right atrial pressure of 3 mmHg, the estimated right ventricular systolic pressure is 24.7 mmHg.  Left Atrium: Left atrial size was normal in size.  Right Atrium: Right atrial size was normal in  size.  Pericardium: There is no evidence of pericardial effusion.  Mitral Valve: The mitral valve is normal in structure. Mild mitral annular calcification. Trivial mitral valve regurgitation. No evidence of mitral valve stenosis.  Tricuspid Valve: The tricuspid valve is normal in structure. Tricuspid valve regurgitation is trivial. No evidence of tricuspid stenosis.  Aortic Valve: The aortic valve is tricuspid. Aortic valve regurgitation is mild. Aortic regurgitation PHT measures 600 msec. Mild aortic valve sclerosis is present, with no evidence of aortic valve stenosis.  Pulmonic Valve: The pulmonic valve was normal in structure. Pulmonic valve regurgitation is not visualized. No evidence of pulmonic stenosis.  Aorta: Aortic dilatation noted. There is mild dilatation of the ascending aorta, measuring 40 mm.  Venous: The inferior vena cava is normal in size with greater than 50% respiratory variability, suggesting right atrial pressure of 3 mmHg.  IAS/Shunts: No atrial level shunt detected by color flow Doppler.   LEFT VENTRICLE PLAX 2D LVIDd:         4.25 cm  Diastology LVIDs:         2.45 cm  LV e' medial:    9.14 cm/s LV PW:         0.90 cm  LV E/e' medial:  8.5 LV IVS:        0.70 cm  LV e' lateral:   11.20 cm/s LVOT diam:     2.00 cm  LV E/e' lateral: 6.9 LV SV:         79 LV SV Index:   40 LVOT Area:     3.14 cm  3D Volume EF: 3D EF:        72 % LV EDV:       146 ml LV ESV:       40 ml LV SV:        106 ml  RIGHT VENTRICLE RV S prime:     19.60 cm/s TAPSE (M-mode): 2.5 cm  LEFT ATRIUM             Index       RIGHT ATRIUM           Index LA diam:        3.90 cm 1.99 cm/m  RA Area:     16.60 cm LA Vol (A2C):   57.1 ml 29.18 ml/m RA Volume:   43.40 ml  22.18 ml/m LA Vol (A4C):   65.0 ml 33.22 ml/m LA Biplane Vol: 61.7 ml 31.53 ml/m AORTIC VALVE LVOT Vmax:   114.00 cm/s LVOT Vmean:  72.000 cm/s LVOT VTI:    0.252 m AI PHT:      600 msec  AORTA Ao Root  diam: 3.40 cm Ao Asc diam:  3.95 cm  MITRAL VALVE                TRICUSPID VALVE MV Area (PHT): cm          TR Peak grad:   21.7 mmHg MV Decel Time: 253 msec     TR Vmax:        233.00 cm/s MV E velocity: 77.55 cm/s MV A velocity: 113.50 cm/s  SHUNTS MV E/A ratio:  0.68         Systemic VTI:  0.25 m Systemic Diam: 2.00 cm  Redell Shallow MD Electronically signed by Redell Shallow MD Signature Date/Time: 02/15/2021/12:54:22 PM    Final    MONITORS  CARDIAC EVENT MONITOR 04/04/2021  Narrative 1. The basic rhythm is normal sinus with an average HR of 82 bpm 2. No atrial fibrillation or flutter 3. No high-grade heart block or pathologic pauses 4. There are rare PVC's (<1%) and occasional supraventricular beats (3%) without sustained arrhythmias       ______________________________________________________________________________________________      EKG:   EKG Interpretation Date/Time:  Wednesday April 13 2024 15:39:51 EDT Ventricular Rate:  71 PR Interval:  154 QRS Duration:  70 QT Interval:  388 QTC Calculation: 421 R Axis:   -2  Text Interpretation: Normal sinus rhythm Normal ECG When compared with ECG of 12-Mar-2023 15:38, No significant change was found Confirmed by Wonda Sharper [phone]) on 04/13/2024 3:51:47 PM    Recent Labs: 02/24/2024: BUN 24; Creatinine, Ser 0.97; Potassium 4.6; Sodium 136  Recent Lipid Panel    Component Value Date/Time   CHOL 228 (H) 01/07/2023 1506   TRIG 76.0 01/07/2023 1506   HDL 101.40 01/07/2023 1506   CHOLHDL 2 01/07/2023 1506   VLDL 15.2 01/07/2023 1506   LDLCALC 112 (H) 01/07/2023 1506   LDLCALC 99 07/13/2020 1500     Risk Assessment/Calculations:    CHA2DS2-VASc Score = 3   This indicates a 3.2% annual risk of stroke. The patient's score is based upon: CHF History: 0 HTN History: 0 Diabetes History: 0 Stroke History: 0 Vascular Disease History: 0 Age Score: 2 Gender Score: 1               Physical Exam:     VS:  BP 126/72   Pulse 71   Ht 5' 4.75 (1.645 m)   Wt 183 lb (83 kg)   LMP  (LMP Unknown)   SpO2 99%   BMI 30.69 kg/m     Wt Readings from Last 3 Encounters:  04/13/24 183 lb (83 kg)  12/10/23 193 lb (87.5 kg)  12/10/23 193 lb (87.5 kg)     GEN:  Well nourished, well developed in no acute distress HEENT: Normal NECK: No JVD; No carotid bruits LYMPHATICS: No lymphadenopathy CARDIAC: RRR, no murmurs, rubs, gallops RESPIRATORY:  Clear to auscultation without rales, wheezing or rhonchi  ABDOMEN: Soft, non-tender, non-distended MUSCULOSKELETAL:  No edema; No deformity  SKIN: Warm and dry NEUROLOGIC:  Alert and oriented x 3 PSYCHIATRIC:  Normal affect   Assessment & Plan Paroxysmal atrial fibrillation [facility]) The patient has a low atrial fibrillation burden.  She had about 6 or 7 hours of atrial fibrillation in the last month.  I reviewed her previous transmissions demonstrating rare episodes of brief atrial tachycardia/atrial fibrillation.  Overall she seems to be doing very well.  We discussed the ongoing risk/benefit of continuing chronic oral anticoagulation.  She appears clinically stable with no contraindication to oral anticoagulation.  She is not having  frequent falls, nor is she having any bleeding problems.  Her CHA2DS2-VASc score is 3.  I will see her back in 1 year for follow-up evaluation.            Medication Adjustments/Labs and Tests Ordered: Current medicines are reviewed at length with the patient today.  Concerns regarding medicines are outlined above.  Orders Placed This Encounter  Procedures   EKG 12-Lead   No orders of the defined types were placed in this encounter.   Patient Instructions  Follow-Up: At [facility], you and your health needs are our priority.  As part of our continuing mission to provide you with exceptional heart care, our providers are all part of one team.  This team includes your primary Cardiologist (physician) and  Advanced Practice Providers or APPs (Physician Assistants and Nurse Practitioners) who all work together to provide you with the care you need, when you need it.  Your next appointment:   1 year(s)  Provider:   Ozell Fell, MD       Signed, Ozell Fell, MD  04/13/2024 5:40 PM    [facility]

## 2024-04-14 ENCOUNTER — Telehealth: Payer: Self-pay | Admitting: Family Medicine

## 2024-04-14 NOTE — Telephone Encounter (Signed)
 Copied from CRM #[id number]. Topic: Clinical - Medication Refill >> Apr 14, 2024  4:33 PM Leah C wrote: Medication: hydrOXYzine  (VISTARIL ) 25 MG capsule  Has the patient contacted their pharmacy? Yes. Patient was getting the medicine filled through Dermatologist but they wouldn't fill the medication because the patient has not been seen in over a year. Patient then called her other dermatologist but they did not answer the phone.  Patient is out of medication and is wondering if [facility] can fill the script for her.   This is the patient's preferred pharmacy:  [facility] - [place], [place] - [address] [place] [address] Phone: [phone] Fax: [phone]   Is this the correct pharmacy for this prescription? Yes If no, delete pharmacy and type the correct one.   Has the prescription been filled recently? Yes  Is the patient out of the medication? Yes  Has the patient been seen for an appointment in the last year OR does the patient have an upcoming appointment? Yes  Can we respond through MyChart? Yes  Agent: Please be advised that Rx refills may take up to 3 business days. We ask that you follow-up with your pharmacy.

## 2024-04-15 ENCOUNTER — Other Ambulatory Visit (HOSPITAL_COMMUNITY)
Admission: RE | Admit: 2024-04-15 | Discharge: 2024-04-15 | Disposition: A | Discharge status: Home / Self Care | Source: Other Acute Inpatient Hospital | Attending: Obstetrics | Admitting: Obstetrics

## 2024-04-15 ENCOUNTER — Ambulatory Visit: Payer: Self-pay | Admitting: Obstetrics

## 2024-04-15 ENCOUNTER — Ambulatory Visit

## 2024-04-15 VITALS — BP 123/79 | HR 88

## 2024-04-15 DIAGNOSIS — R82998 Other abnormal findings in urine: Secondary | ICD-10-CM | POA: Insufficient documentation

## 2024-04-15 DIAGNOSIS — R350 Frequency of micturition: Secondary | ICD-10-CM | POA: Diagnosis not present

## 2024-04-15 LAB — POCT URINALYSIS DIP (CLINITEK)
Bilirubin, UA: NEGATIVE
Blood, UA: NEGATIVE
Glucose, UA: NEGATIVE mg/dL
Ketones, POC UA: NEGATIVE mg/dL
Leukocytes, UA: NEGATIVE
Nitrite, UA: NEGATIVE
POC PROTEIN,UA: NEGATIVE
Spec Grav, UA: 1.015 (ref 1.010–1.025)
Urobilinogen, UA: 0.2 U/dL
pH, UA: 7 (ref 5.0–8.0)

## 2024-04-15 MED ORDER — HYDROXYZINE PAMOATE 25 MG PO CAPS: ORAL_CAPSULE | ORAL | 1 refills | Status: AC

## 2024-04-15 NOTE — Progress Notes (Signed)
 Katrina Benitez is a 78 y.o. female arrived today with UTI sx.  A urine specimen was collected and POCT Urine was done. POCT Urine was Negative Urine was sent for culture

## 2024-04-15 NOTE — Telephone Encounter (Signed)
 Medication already refilled.

## 2024-04-15 NOTE — Patient Instructions (Addendum)
 Your Urine dip that was done in office was Negative. I am sending the urine off for culture and you can take AZO over the counter for your discomfort.  We will contact you when the results are back between 3-5 days. If an antibiotic is needed we will sent the order to the pharmacy and you will be notified. If you have any questions or concerns please feel free to call us at [phone]

## 2024-04-15 NOTE — Telephone Encounter (Unsigned)
 Copied from CRM #[id number]. Topic: Clinical - Medical Advice >> Apr 14, 2024  4:55 PM Harlene ORN wrote: Reason for CRM: Patient called. Requesting an emergency refill of hydrOXYzine  (VISTARIL ) 25 MG capsule. She is completely out of medication. Her prescription was originally prescribed by Dr. Jason, but hasnt been seen by that doctor in over a year, and she is almost out of her medication. Please advise.

## 2024-04-16 LAB — URINE CULTURE: Culture: <10000 — AB

## 2024-04-19 ENCOUNTER — Other Ambulatory Visit: Payer: Self-pay

## 2024-04-19 DIAGNOSIS — R300 Dysuria: Secondary | ICD-10-CM

## 2024-04-21 ENCOUNTER — Ambulatory Visit: Payer: Self-pay | Admitting: Physical Therapy

## 2024-04-21 DIAGNOSIS — R300 Dysuria: Secondary | ICD-10-CM | POA: Diagnosis not present

## 2024-04-21 DIAGNOSIS — R279 Unspecified lack of coordination: Secondary | ICD-10-CM

## 2024-04-21 DIAGNOSIS — R3915 Urgency of urination: Secondary | ICD-10-CM | POA: Diagnosis not present

## 2024-04-21 DIAGNOSIS — R293 Abnormal posture: Secondary | ICD-10-CM

## 2024-04-21 DIAGNOSIS — M62838 Other muscle spasm: Secondary | ICD-10-CM

## 2024-04-21 DIAGNOSIS — M6281 Muscle weakness (generalized): Secondary | ICD-10-CM | POA: Diagnosis not present

## 2024-04-21 NOTE — Patient Instructions (Signed)

## 2024-04-21 NOTE — Therapy (Signed)
 OUTPATIENT PHYSICAL THERAPY FEMALE PELVIC TREATMENT   Patient Name: Katrina Benitez MRN: [id number] DOB:01/07/1946, 78 y.o., female Today's Date: 04/21/2024  END OF SESSION:  PT End of Session - 04/21/24 1534     Visit Number 8    Date for PT Re-Evaluation 03/28/24    Authorization Type MCR    Progress Note Due on Visit --   recert sent on 6th visit   PT Start Time 1535   arrival time   PT Stop Time 1614    PT Time Calculation (min) 39 min    Activity Tolerance Patient tolerated treatment well    Behavior During Therapy WFL for tasks assessed/performed           Past Medical History:  Diagnosis Date   Allergy    Anemia    Anxiety    Arthritis    degenerative disc disorder   Cancer (HCC)    skin cancer   Cardiac arrhythmia    Cataract    surgery in right eye in 03/2022   Diverticulitis    Diverticulosis    GERD (gastroesophageal reflux disease)    Heart murmur    HTN (hypertension)    not on meds in 2 years per pt on 05/19/22 n   Infertility, female    Neuromuscular disorder (HCC)    idiopathic peripheral  neuropathy   Peripheral neuropathy    Positive PPD    SIADH (syndrome of inappropriate ADH production) (HCC)    Past Surgical History:  Procedure Laterality Date   BREAST BIOPSY Left    BREAST EXCISIONAL BIOPSY Left    benign   CATARACT EXTRACTION Right 2023   CERVICAL BIOPSY  W/ LOOP ELECTRODE EXCISION     CERVICAL POLYPECTOMY  02/2016   CHOLECYSTECTOMY N/A 06/02/2022   Procedure: LAPAROSCOPIC CHOLECYSTECTOMY WITH INTRAOPERATIVE CHOLANGIOGRAM;  Surgeon: Eletha Boas, MD;  Location: [facility];  Service: General;  Laterality: N/A;   COLONOSCOPY     COLPOSCOPY     DILATION AND CURETTAGE OF UTERUS     HYSTEROSCOPY     LOOP RECORDER IMPLANT     LUMBAR DISC SURGERY     LUMBAR LAMINECTOMY     TONSILLECTOMY     UPPER GASTROINTESTINAL ENDOSCOPY     Patient Active Problem List   Diagnosis Date Noted   Acute bilateral low back pain without sciatica 02/28/2024    Hyponatremia 02/28/2024   Recurrent UTI 02/28/2024   Bladder pain 12/10/2023   Urge urinary incontinence 12/10/2023   History of recurrent UTI (urinary tract infection) 12/10/2023   Vaginal atrophy 12/10/2023   Constipation 12/10/2023   Prediabetes 07/30/2023   Medication management 01/08/2023   Abdominal pain, right upper quadrant 05/18/2022   Symptomatic cholelithiasis 05/18/2022   Syncope and collapse 03/05/2022   Paroxysmal atrial fibrillation (HCC) 01/27/2022   Increased risk of breast cancer 04/18/2021   Anemia 01/09/2021   Pruritus 11/14/2019   Depression, recurrent (HCC) 02/18/2019   Primary insomnia 02/18/2019   Atrial fibrillation with RVR (HCC) 01/19/2019   Hyperlipidemia 09/30/2018   Post herpetic neuralgia 08/12/2018   Idiopathic peripheral neuropathy 08/12/2018   Vitamin D  deficiency 07/22/2018   Vulvar pruritus 04/16/2016   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   Opacity noted on imaging study    Chest pain 01/21/2016   SIADH (syndrome of inappropriate ADH production) (HCC) 01/21/2016   Hypertension 01/21/2016   Anxiety 01/21/2016   GERD (gastroesophageal reflux disease) 01/21/2016   Gait abnormality 12/12/2015    PCP: Alvia Corean CROME, FNP  REFERRING  PROVIDER: Alvia Corean CROME, FNP  REFERRING DIAG: [phone] (ICD-10-CM) - Pelvic floor dysfunction in female  THERAPY DIAG:  Muscle weakness (generalized)  Unspecified lack of coordination  Abnormal posture  Other muscle spasm  Rationale for Evaluation and Treatment: Rehabilitation  ONSET DATE: 3 years  SUBJECTIVE:                                                                                                                                                                                           SUBJECTIVE STATEMENT: Had an episode of increased burning, pain, and urinary incontinence and needing to get up a few times during the night to urinate. About 2 weeks ago. Has felt stressed with family visiting  and being busy with this.  Has been addressing the dryness at external pelvic floor with her estrogen 2x weekly externally and internally.   PAIN:  None at this time. But at night: Are you having pain? Yes during night NPRS scale: 10/10 Pain location: low/mid back  Pain type: stiffness Pain description:intermittent  Aggravating factors: sitting, lying down Relieving factors: stretching and mobility  PRECAUTIONS: None  RED FLAGS: None   WEIGHT BEARING RESTRICTIONS: No  FALLS:  Has patient fallen in last 6 months? No  OCCUPATION: retired - physician   ACTIVITY LEVEL : lower   PLOF: Independent  PATIENT GOALS: to have less leakage  PERTINENT HISTORY:  Vulvodynia, Vulvar pruritus, Anxiety Sexual abuse: No  BOWEL MOVEMENT: Pain with bowel movement: Yes Type of bowel movement:Type (Bristol Stool Scale) vary, Frequency every day, and Strain yes Fully empty rectum: No Leakage: No Pads: No Fiber supplement/laxative miralax   URINATION: Pain with urination: Yes Fully empty bladder: No Stream: Strong and Weak Urgency: Yes  Frequency: sometimes every two hours; 1-3x nightly Leakage: Urge to void and Walking to the bathroom Pads: No  INTERCOURSE:  Ability to have vaginal penetration Yes  Pain with intercourse: none DrynessYes  Climax: not active   PREGNANCY: Vaginal deliveries 0  C-section deliveries 0   PROLAPSE: None   OBJECTIVE:  Note: Objective measures were completed at Evaluation unless otherwise noted.  DIAGNOSTIC FINDINGS:    COGNITION: Overall cognitive status: Within functional limits for tasks assessed     SENSATION: Light touch: Appears intact   FUNCTIONAL TESTS:  Functional squat - descent limited by 25%  GAIT: WFL  POSTURE: rounded shoulders, forward head, and posterior pelvic tilt   LUMBARAROM/PROM:  A/PROM A/PROM  eval  Flexion WFL  Extension WFL  Right lateral flexion Limited by 25%  Left lateral flexion Limited  by 25%  Right rotation Limited by 25%  Left rotation Limited by 25%   (  Blank rows = not tested)  LOWER EXTREMITY ROM:  Bil hamstrings and adductors limited by 25%  LOWER EXTREMITY MMT:  Bil hips grossly 4/5; knees 4+/5 PALPATION:   General: tightness at bil lumbar paraspinals   Pelvic Alignment: WFL  Abdominal: tightness noted in abdominal lower quadrants but no TTP                External Perineal Exam: dryness noted at vulva, age related changes noted.                              Internal Pelvic Floor: mild TTP at bil obturators, tightness noted throughout deep and superficial layers  Patient confirms identification and approves PT to assess internal pelvic floor and treatment Yes No emotional/communication barriers or cognitive limitation. Patient is motivated to learn. Patient understands and agrees with treatment goals and plan. PT explains patient will be examined in standing, sitting, and lying down to see how their muscles and joints work. When they are ready, they will be asked to remove their underwear so PT can examine their perineum. The patient is also given the option of providing their own chaperone as one is not provided in our facility. The patient also has the right and is explained the right to defer or refuse any part of the evaluation or treatment including the internal exam. With the patient's consent, PT will use one gloved finger to gently assess the muscles of the pelvic floor, seeing how well it contracts and relaxes and if there is muscle symmetry. After, the patient will get dressed and PT and patient will discuss exam findings and plan of care. PT and patient discuss plan of care, schedule, attendance policy and HEP activities.  PELVIC MMT:   MMT eval  Vaginal 3/5 (but tightness throughout and decreased ability to relax pelvic floor)  Internal Anal Sphincter   External Anal Sphincter   Puborectalis   Diastasis Recti   (Blank rows = not tested)         TONE: Increased throughout   PROLAPSE:  Not seen TODAY'S TREATMENT:                                                                                                                              DATE:  03/21/24: Pt had several questions about internal treatment, entire process explained with one gloved digit in vagina with gentle palpation throughout superficial and deep bil pelvic floor layers for assessing if tension is present and if pt has TTP. Pt consented. Patient consented to internal pelvic floor treatment vaginally this date and found to have tension at anterior and posterior pelvic floor slightly more so at anterior. Manual here for improved mobility and decreased burning. Also externally at clitoris pt found to have to tension in rotational directions and upward with gentle mobility completed which pt reports replicates burning pain. Manual here with bimanual at vaginal  downward stretch completed with improved mobility noted Pt educated on tension and gentle stretching completed throughout treatment, pt had questions about HEP and POC and all answered. Pt requests additional visits scheduled due to her improvement in symptoms but not resolution.   03/23/24: Pt educated on voiding mechanics with squatty potty, abdominal massage continuation, and exhale and pelvic floor relaxation instead of pushing, pelvic mobility at toilet to improve output.  Pelvic floor relaxation video educated on Vibration plate 1 min standing low setting, 2x1 min sitting low setting for improved pelvic floor relaxation for improved voiding.  Pt also educated on possible benefits from additional internal pelvic floor treatment for improved mobility as pt reported improved after last session. However pt declined today reporting feeling full with bladder and bowel.   04/21/24: Patient consented to internal pelvic floor treatment vaginally this date and found to have tension at anterior pelvic floor slightly more so at  anterior. Manual here for improved mobility and decreased burning. Also externally at clitoris pt found to have mild tension in rotational directions and upward with gentle mobility completed which again today, pt reports replicates burning pain, though not as strong. Manual here with bimanual at vaginal downward stretch completed with improved mobility noted.  Pt educated to continue HEP and mobility exercises with relaxation cues as pt did report she felt replicated pain with stretch and improvement after.    PATIENT EDUCATION:  Education details: [id number], fiber types Person educated: Patient Education method: Explanation, Demonstration, Tactile cues, Verbal cues, and Handouts Education comprehension: verbalized understanding, returned demonstration, verbal cues required, tactile cues required, and needs further education  HOME EXERCISE PROGRAM: [id number]  ASSESSMENT:  CLINICAL IMPRESSION: Patient is a 78 y.o. female  who was seen today for physical therapy treatment for pelvic floor tension, urinary burning and pain, and constipation. Session focused on manual for decreased burning and pelvic pain. Pt does report improvement with bowels and bladder overall but did have high pain night once since last appointment. and would benefit from additional PT to further address deficits.    OBJECTIVE IMPAIRMENTS: decreased activity tolerance, decreased coordination, decreased knowledge of condition, decreased mobility, decreased strength, increased fascial restrictions, increased muscle spasms, impaired flexibility, improper body mechanics, and postural dysfunction.   ACTIVITY LIMITATIONS: continence and locomotion level  PARTICIPATION LIMITATIONS: community activity  PERSONAL FACTORS: Time since onset of injury/illness/exacerbation are also affecting patient's functional outcome.   REHAB POTENTIAL: Good  CLINICAL DECISION MAKING: Stable/uncomplicated  EVALUATION COMPLEXITY:  Low   GOALS: Goals reviewed with patient? Yes  SHORT TERM GOALS: Target date: 01/25/23  Pt to be I with HEP.  Baseline: Goal status: MET  2.  Pt to be I with relaxation techniques for decreased strain at pelvic floor. Baseline:  Goal status: MET  3.  Pt will be independent with use of squatty potty, relaxed toileting mechanics, and improved bowel movement techniques in order to increase ease of bowel movements and complete evacuation.   Baseline:  Goal status: MET  4.  Pt will be able to teach back and utilize urge suppression technique in order to help reduce number of trips to the bathroom.    Baseline:  Goal status: MET  LONG TERM GOALS: Target date: 03/28/24  Pt to be I with advanced HEP.  Baseline:  Goal status: on going  2.  Pt to report no more than 1 instance of urinary incontinence in a week due to improved pelvic floor tissue mobility and I with urge suppression techniques.  Baseline:  Goal status: on going  3.  Pt to report no fecal leakage in at least 1 month due to improved evacuation habits.  Baseline:  Goal status: on going  4.  Pt to demonstrate no restrictions at lumbar spine and bil hips for decreased strain at pelvic floor.  Baseline:  Goal status: on going  5.  Pt to demonstrate at least 4/5 pelvic floor strength and ability to fully relax after each contraction for improved pelvic stability and decreased strain at pelvic floor/ decrease leakage.  Baseline:  Goal status: on going  6.  Pt to demonstrate improved coordination of pelvic floor and breathing mechanics with 10# squat with appropriate synergistic patterns to decrease pain and leakage at least 75% of the time.    Baseline:  Goal status: on going  PLAN:  PT FREQUENCY: 1-2x/week  PT DURATION: 15 sessions  PLANNED INTERVENTIONS: 97110-Therapeutic exercises, 97530- Therapeutic activity, 97112- Neuromuscular re-education, 97535- Self Care, [id number]- Manual therapy, Patient/Family education,  Taping, Dry Needling, Joint mobilization, Spinal mobilization, Scar mobilization, DME instructions, Cryotherapy, Moist heat, and Biofeedback  PLAN FOR NEXT SESSION: internal as needed, hip and trunk stretches, relaxation techniques    Darryle Navy, PT, DPT 07/31/253:35 PM

## 2024-04-23 ENCOUNTER — Other Ambulatory Visit: Payer: Self-pay | Admitting: Family Medicine

## 2024-04-23 DIAGNOSIS — F419 Anxiety disorder, unspecified: Secondary | ICD-10-CM

## 2024-04-25 ENCOUNTER — Other Ambulatory Visit (INDEPENDENT_AMBULATORY_CARE_PROVIDER_SITE_OTHER)

## 2024-04-25 ENCOUNTER — Telehealth: Admitting: Obstetrics

## 2024-04-25 DIAGNOSIS — K5900 Constipation, unspecified: Secondary | ICD-10-CM

## 2024-04-25 DIAGNOSIS — Z79899 Other long term (current) drug therapy: Secondary | ICD-10-CM | POA: Diagnosis not present

## 2024-04-25 DIAGNOSIS — Z87440 Personal history of urinary (tract) infections: Secondary | ICD-10-CM

## 2024-04-25 DIAGNOSIS — M545 Low back pain, unspecified: Secondary | ICD-10-CM | POA: Diagnosis not present

## 2024-04-25 DIAGNOSIS — E222 Syndrome of inappropriate secretion of antidiuretic hormone: Secondary | ICD-10-CM

## 2024-04-25 DIAGNOSIS — E871 Hypo-osmolality and hyponatremia: Secondary | ICD-10-CM

## 2024-04-25 LAB — PATHNOSTICS MOLECULAR TEST

## 2024-04-25 LAB — BASIC METABOLIC PANEL WITH GFR
BUN: 17 mg/dL (ref 6–23)
CO2: 29 meq/L (ref 19–32)
Calcium: 8.8 mg/dL (ref 8.4–10.5)
Chloride: 103 meq/L (ref 96–112)
Creatinine, Ser: 0.87 mg/dL (ref 0.40–1.20)
GFR: 64 mL/min (ref >60.00)
Glucose, Bld: 93 mg/dL (ref 70–99)
Potassium: 5 meq/L (ref 3.5–5.1)
Sodium: 139 meq/L (ref 135–145)

## 2024-04-25 MED ORDER — METHENAMINE HIPPURATE 1 G PO TABS: 1.0000 g | ORAL_TABLET | Freq: Every day | ORAL | 2 refills | Status: DC

## 2024-04-25 NOTE — Progress Notes (Signed)
 [facility] Return Visit  SUBJECTIVE  History of Present Illness: Katrina Benitez is a 78 y.o. female seen in follow-up for vulvodynia, vaginal atrophy, history of recurrent UTI, sensation of incomplete bladder emptying, constipation, and lower urinary tract symptoms. Plan at last visit was referral to pelvic floor PT, .   Patient location: home in [place] Provider location: Program[place], [facility] for [facility]  Patient consents to video call and understands the limitations of telehealth visit.  Urinary symptoms of burning with urination and leakage.  Using vaginal estrogen twice a week Underwent pelvic PT 04/21/24, reports improvement of urinary symptoms since PT.  Added 2 teaspoon/day metamucil with improvement of stool consistency Continues squatty potty use.  Desires to proceed with SI joint injections for back pain.  Discontinued Hipprex since Rx ran out. Negative cystoscopy 03/17/24 Negative UA on 12/10/23 and 04/15/24. Culture with insufficient growth and negative pathnostics.  Office evaluation 12/21/23 for persistent dysuria, hematuria and urinary frequency. UA UA negative, culture with 10K E. Coli resistant to bactrim , gentamicin, ampicillin and intermediate to Augmentin .  Rx macrobid  and pyridium  on 12/22/23 changed to Keflex  500mg  QID on 12/23/23 due to persistent symptoms and Rx to start methenamine  for UTI suppression.  Prior return of dysuria and some external itching on daily hipprex Resolution of urinary urgency and improved bladder control.  Keflex  with most relief towards the end of the treatment course Bowel movements, started using squatty potty with Bristol III stool  Continues vaginal estrogen 1g twice a week Continues hydroxyzine  50mg  QID No relief with topical lidocaine  Reports worsening symptoms of peripheral neuropathy pending follow-up with Dr. Leigh (neurology)  Past Medical History: Patient  has a past medical history of Allergy,  Anemia, Anxiety, Arthritis, Cancer (HCC), Cardiac arrhythmia, Cataract, Diverticulitis, Diverticulosis, GERD (gastroesophageal reflux disease), Heart murmur, HTN (hypertension), Infertility, female, Neuromuscular disorder (HCC), Peripheral neuropathy, Positive PPD, and SIADH (syndrome of inappropriate ADH production) (HCC).   Past Surgical History: She  has a past surgical history that includes Lumbar disc surgery; Lumbar laminectomy; Tonsillectomy; Colonoscopy; Upper gastrointestinal endoscopy; Cervical polypectomy (02/2016); Breast biopsy (Left); Breast excisional biopsy (Left); Colposcopy; Dilation and curettage of uterus; Hysteroscopy; Cervical biopsy w/ loop electrode excision; Loop recorder implant; Cholecystectomy (N/A, 06/02/2022); and Cataract extraction (Right, 2023).   Medications: She has a current medication list which includes the following prescription(s): gaviscon extra strength, betamethasone  valerate, betamethasone  valerate ointment, cholecalciferol, cyanocobalamin , diazepam , diphenoxylate -atropine , eliquis , estradiol , furosemide , gabapentin , hydroxyzine , lidocaine , methenamine , mupirocin  ointment, omeprazole , ondansetron , systane hydration pf, cerave itch relief, probiotic product, sodium chloride , thiamine  hcl, triamcinolone  cream, and zolpidem .   Allergies: Patient is allergic to flagyl [metronidazole], hydromorphone, iodixanol, fentanyl , cipro [ciprofloxacin hcl], dilaudid [hydromorphone hcl], and ivp dye [iodinated contrast media].   Social History: Patient  reports that she has never smoked. She has never used smokeless tobacco. She reports current alcohol use of about 7.0 standard drinks of alcohol per week. She reports that she does not use drugs.     OBJECTIVE     Physical Exam: Gen: No apparent distress, A&O x 3.    ASSESSMENT AND PLAN    Katrina Benitez is a 78 y.o. with:  1. Acute bilateral low back pain without sciatica   2. History of recurrent UTI (urinary tract  infection)   3. Constipation, unspecified constipation type      Acute bilateral low back pain without sciatica Assessment & Plan: - reports recent increase in back pain - refer to Dr. Burnetta for orthopedic surgeon  - continue pelvic  binder use - continue pelvic floor PT  Orders: -     Ambulatory referral to Orthopedic Surgery  History of recurrent UTI (urinary tract infection) Assessment & Plan: - prior improvement of UTI symptoms after Keflex  QID dosing and some return of dysuria for 1 week after missing vaginal estrogen dosing  - continue vaginal estrogen, can increase to 1g 3x/week - resume methenamine   - symptoms improved after pelvic floor PT, encouraged to continue - prior POCT clean catch UA + trace leuk/+ heme, catheterized urine testing negative. Negative UA, culture and pathnostics 04/15/24 - denies kidney stones or hospitalization for pyelonephritis  - For treatment of recurrent urinary tract infections, we discussed management of recurrent UTIs including prophylaxis with a daily low dose antibiotic, transvaginal estrogen therapy, D-mannose, and cranberry supplements.   - negative cystoscopy 03/17/24   - continue low dose vaginal estrogen and probiotics - discussed overlapping symptomatology with bladder pain. 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  We also discussed using pyridium  and similar over-the-counter medications for pain relief as needed. We discussed the pentad of medications including Tums, an antihistamine such as Vistaril , amitriptyline , and L-arginine.  We also discussed in-office bladder instillations for pain flares, as well as cystoscopy with hydrodistention in the operating room, which can be both diagnostic and therapeutic. She was also given information on the IC Network at https://www.ic-network.com for  bladder diet suggestions and patient forums for support. - continue hydroxyzine  QID for idiopathic generalized itching, discussed risks of anticholinergic medications - consider bladder instillation  Orders: -     Methenamine  Hippurate; Take 1 tablet (1 g total) by mouth daily.  Dispense: 30 tablet; Refill: 2  Constipation, unspecified constipation type Assessment & Plan: - symptoms started since vistaril  use, prior bristol I, III, IV on colace improved with daily Bristol III stool previously on 1 capful of miralax - intermittent loose stool improved after discontinuation of miralax  - continue titration of fiber supplementation - continue to avoid chronic straining, as it can exacerbate her pelvic floor symptoms  - continue squatty potty use and pelvic floor relaxation during bowel movements - discussed association with sensation of incomplete bladder emptying - continue pelvic floor PT    Time spent: I spent 25 minutes dedicated to the care of this patient on the date of this encounter to include pre-visit review of records, face-to-face time with the patient discussing history of recurrent UTI, dysuria, vaginal atrophy, constipation, feeling of incomplete bladder emptying, vulvodynia, and post visit documentation and ordering medication/ testing.   Lianne ONEIDA Gillis, MD

## 2024-04-25 NOTE — Assessment & Plan Note (Signed)
-   prior improvement of UTI symptoms after Keflex  QID dosing and some return of dysuria for 1 week after missing vaginal estrogen dosing  - continue vaginal estrogen, can increase to 1g 3x/week - resume methenamine   - symptoms improved after pelvic floor PT, encouraged to continue - prior POCT clean catch UA + trace leuk/+ heme, catheterized urine testing negative. Negative UA, culture and pathnostics 04/15/24 - denies kidney stones or hospitalization for pyelonephritis  - For treatment of recurrent urinary tract infections, we discussed management of recurrent UTIs including prophylaxis with a daily low dose antibiotic, transvaginal estrogen therapy, D-mannose, and cranberry supplements.   - negative cystoscopy 03/17/24   - continue low dose vaginal estrogen and probiotics - discussed overlapping symptomatology with bladder pain. 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  We also discussed using pyridium  and similar over-the-counter medications for pain relief as needed. We discussed the pentad of medications including Tums, an antihistamine such as Vistaril , amitriptyline , and L-arginine.  We also discussed in-office bladder instillations for pain flares, as well as cystoscopy with hydrodistention in the operating room, which can be both diagnostic and therapeutic. She was also given information on the IC Network at https://www.ic-network.com for bladder diet suggestions and patient forums for support. - continue hydroxyzine  QID for idiopathic generalized itching, discussed risks of anticholinergic medications - consider bladder instillation

## 2024-04-25 NOTE — Patient Instructions (Signed)
 Continue vaginal estrogen 1g twice a week.   Continue pelvic floor PT.   Continue fiber supplementation and squatty potty use.   Resume methenamine  1g for meals once a day.   We have referred you to orthopedic surgery for back pain.

## 2024-04-25 NOTE — Assessment & Plan Note (Signed)
-   reports recent increase in back pain - refer to Dr. Burnetta for orthopedic surgeon  - continue pelvic binder use - continue pelvic floor PT

## 2024-04-25 NOTE — Assessment & Plan Note (Addendum)
-   symptoms started since vistaril  use, prior bristol I, III, IV on colace improved with daily Bristol III stool previously on 1 capful of miralax - intermittent loose stool improved after discontinuation of miralax  - continue titration of fiber supplementation - continue to avoid chronic straining, as it can exacerbate her pelvic floor symptoms  - continue squatty potty use and pelvic floor relaxation during bowel movements - discussed association with sensation of incomplete bladder emptying - continue pelvic floor PT

## 2024-04-27 NOTE — Progress Notes (Signed)
 Carelink Summary Report / Loop Recorder

## 2024-05-05 ENCOUNTER — Ambulatory Visit (INDEPENDENT_AMBULATORY_CARE_PROVIDER_SITE_OTHER)

## 2024-05-05 DIAGNOSIS — I480 Paroxysmal atrial fibrillation: Secondary | ICD-10-CM

## 2024-05-05 LAB — CUP PACEART REMOTE DEVICE CHECK
Date Time Interrogation Session: 20250813232642
Implantable Pulse Generator Implant Date: 20230614

## 2024-05-06 ENCOUNTER — Ambulatory Visit: Payer: Self-pay | Admitting: Internal Medicine

## 2024-05-06 ENCOUNTER — Ambulatory Visit (INDEPENDENT_AMBULATORY_CARE_PROVIDER_SITE_OTHER)

## 2024-05-06 ENCOUNTER — Telehealth: Payer: Self-pay | Admitting: Obstetrics

## 2024-05-06 VITALS — Ht 64.0 in | Wt 183.0 lb

## 2024-05-06 DIAGNOSIS — Z0000 Encounter for general adult medical examination without abnormal findings: Secondary | ICD-10-CM

## 2024-05-06 NOTE — Progress Notes (Signed)
 Subjective:   Katrina Benitez is a 78 y.o. who presents for a Medicare Wellness preventive visit.  As a reminder, Annual Wellness Visits don't include a physical exam, and some assessments may be limited, especially if this visit is performed virtually. We may recommend an in-person follow-up visit with your provider if needed.  Visit Complete: Virtual I connected with  Katrina Benitez on 05/06/24 by a audio enabled telemedicine application and verified that I am speaking with the correct person using two identifiers.  Patient Location: Home  Provider Location: Office/Clinic  I discussed the limitations of evaluation and management by telemedicine. The patient expressed understanding and agreed to proceed.  Vital Signs: Because this visit was a virtual/telehealth visit, some criteria may be missing or patient reported. Any vitals not documented were not able to be obtained and vitals that have been documented are patient reported.  VideoDeclined- This patient declined Librarian, academic. Therefore the visit was completed with audio only.  Persons Participating in Visit: Patient.  AWV Questionnaire: No: Patient Medicare AWV questionnaire was not completed prior to this visit.  Cardiac Risk Factors include: advanced age (>26men, >22 women);dyslipidemia;hypertension;obesity (BMI >30kg/m2)     Objective:    Today's Vitals   05/06/24 1502  Weight: 183 lb (83 kg)  Height: 5' 4 (1.626 m)   Body mass index is 31.41 kg/m.     05/06/2024    3:03 PM 01/28/2024    3:02 PM 12/28/2023   12:16 PM 03/10/2023    1:04 PM 01/28/2023    1:53 PM 09/17/2022    7:44 PM 06/02/2022    9:55 AM  Advanced Directives  Does Patient Have a Medical Advance Directive? Yes Yes Yes Yes No No No  Type of Estate agent of [place];Living will Living will;Healthcare Power of [employer] Power of [place];Living will     Does patient want to make changes to  medical advance directive?   No - Patient declined      Copy of Healthcare Power of Attorney in Chart? No - copy requested   No - copy requested     Would patient like information on creating a medical advance directive?     No - Patient declined No - Patient declined No - Patient declined    Current Medications (verified) Outpatient Encounter Medications as of 05/06/2024  Medication Sig   Alum Hydroxide-Mag Carbonate (GAVISCON EXTRA STRENGTH) 160-105 MG CHEW Chew 2 tablets by mouth daily as needed (upset stomach).   betamethasone  valerate (VALISONE ) 0.1 % cream APPLY TWICE DAILY   betamethasone  valerate ointment (VALISONE ) 0.1 % Apply 1 Application topically 2 (two) times daily as needed (itching).   Cholecalciferol 50 MCG (2000 UT) CAPS Take 4,000 Units by mouth daily.   Cyanocobalamin  (VITAMIN B 12 PO) Take by mouth.   diazepam  (VALIUM ) 10 MG tablet TAKE ONE TABLET EVERY TWELVE HOURS AS NEEDED FOR ANXIETY   diphenoxylate -atropine  (LOMOTIL ) 2.5-0.025 MG tablet TAKE ONE (1) TABLET FOUR TIMES EACH DAY AS NEEDED FOR DIARRHEA OR LOOSE STOOLS   ELIQUIS  5 MG TABS tablet TAKE ONE TABLET BY MOUTH TWICE DAILY   estradiol  (ESTRACE ) 0.1 MG/GM vaginal cream 1 gram vaginally nightly x 7 - 10 days and then twice weekly   furosemide  (LASIX ) 20 MG tablet Take 1 tablet (20 mg total) by mouth daily.   gabapentin  (NEURONTIN ) 600 MG tablet TAKE ONE TABLET BY MOUTH THREE TIMES DAILY   hydrOXYzine  (VISTARIL ) 25 MG capsule Two capsules 4  times daily   lidocaine  (LMX) 4 % cream Use 0.5g peasize up to 3 times a day as needed for pain   methenamine  (HIPREX ) 1 g tablet Take 1 tablet (1 g total) by mouth daily.   mupirocin  ointment (BACTROBAN ) 2 % Apply 1 Application topically 2 (two) times daily.   omeprazole  (PRILOSEC) 40 MG capsule TAKE ONE CAPSULE EACH DAY   ondansetron  (ZOFRAN -ODT) 8 MG disintegrating tablet TAKE ONE TABLET EVERY EIGHT HOURS AS NEEDED FOR NAUSEA AND VOMITING   Polyethyl Glyc-Propyl Glyc PF  (SYSTANE HYDRATION PF) 0.4-0.3 % SOLN Place 1 drop into both eyes daily as needed (dry eyes).   Pramoxine HCl (CERAVE ITCH RELIEF) 1 % CREA Apply 1 Application topically daily as needed (itching).   Probiotic Product (ALIGN PO) Take 1 capsule by mouth daily.   sodium chloride  1 g tablet Take 1 g by mouth 2 (two) times daily with a meal.   Thiamine  HCl (VITAMIN B1 PO) Take by mouth.   triamcinolone  cream (KENALOG ) 0.1 % Apply 1 Application topically as needed (itching).   zolpidem  (AMBIEN ) 10 MG tablet TAKE ONE TABLET BY MOUTH AT BEDTIME AS NEEDED FOR SLEEP   No facility-administered encounter medications on file as of 05/06/2024.    Allergies (verified) Flagyl [metronidazole], Hydromorphone, Iodixanol, Fentanyl , Cipro [ciprofloxacin hcl], Dilaudid [hydromorphone hcl], and Ivp dye [iodinated contrast media]   History: Past Medical History:  Diagnosis Date   Allergy    Anemia    Anxiety    Arthritis    degenerative disc disorder   Cancer (HCC)    skin cancer   Cardiac arrhythmia    Cataract    surgery in right eye in 03/2022   Diverticulitis    Diverticulosis    GERD (gastroesophageal reflux disease)    Heart murmur    HTN (hypertension)    not on meds in 2 years per pt on 05/19/22 n   Infertility, female    Neuromuscular disorder (HCC)    idiopathic peripheral  neuropathy   Peripheral neuropathy    Positive PPD    SIADH (syndrome of inappropriate ADH production) (HCC)    Past Surgical History:  Procedure Laterality Date   BREAST BIOPSY Left    BREAST EXCISIONAL BIOPSY Left    benign   CATARACT EXTRACTION Right 2023   CERVICAL BIOPSY  W/ LOOP ELECTRODE EXCISION     CERVICAL POLYPECTOMY  02/2016   CHOLECYSTECTOMY N/A 06/02/2022   Procedure: LAPAROSCOPIC CHOLECYSTECTOMY WITH INTRAOPERATIVE CHOLANGIOGRAM;  Surgeon: Eletha Boas, MD;  Location: [facility];  Service: General;  Laterality: N/A;   COLONOSCOPY     COLPOSCOPY     DILATION AND CURETTAGE OF UTERUS     HYSTEROSCOPY      LOOP RECORDER IMPLANT     LUMBAR DISC SURGERY     LUMBAR LAMINECTOMY     TONSILLECTOMY     UPPER GASTROINTESTINAL ENDOSCOPY     Family History  Problem Relation Age of Onset   Breast cancer Mother    Glaucoma Mother    Miscarriages / Stillbirths Mother    Heart attack Father    Depression Father    Early death Father    Heart disease Father    Depression Brother    Hypertension Brother    Tuberculosis Paternal Grandmother    Prostate cancer Paternal Grandfather    Colon cancer Neg Hx    Esophageal cancer Neg Hx    Rectal cancer Neg Hx    Stomach cancer Neg Hx  Uterine cancer Neg Hx    Bladder Cancer Neg Hx    Social History   Socioeconomic History   Marital status: Married    Spouse name: John   Number of children: 0   Years of education: College   Highest education level: Not on file  Occupational History   Occupation: retired physician  Tobacco Use   Smoking status: Never   Smokeless tobacco: Never  Vaping Use   Vaping status: Never Used  Substance and Sexual Activity   Alcohol use: Yes    Alcohol/week: 7.0 standard drinks of alcohol    Types: 7 Glasses of wine per week    Comment: 102 glsses wine daily- 1 -1.1/2 a day 01/28/24   Drug use: No   Sexual activity: Not Currently    Partners: Male    Birth control/protection: Post-menopausal  Other Topics Concern   Not on file  Social History Narrative   Lives at home with her husband. Two story   Right-handed.   1-2 cups caffeine  per day.   Retired Marine scientist   Social Drivers of Corporate investment banker Strain: Low Risk  (05/06/2024)   Overall Financial Resource Strain (CARDIA)    Difficulty of Paying Living Expenses: Not hard at all  Food Insecurity: No Food Insecurity (05/06/2024)   Hunger Vital Sign    Worried About Running Out of Food in the Last Year: Never true    Ran Out of Food in the Last Year: Never true  Transportation Needs: No Transportation Needs (05/06/2024)   PRAPARE -  Administrator, Civil Service (Medical): No    Lack of Transportation (Non-Medical): No  Physical Activity: Insufficiently Active (05/06/2024)   Exercise Vital Sign    Days of Exercise per Week: 7 days    Minutes of Exercise per Session: 10 min  Stress: No Stress Concern Present (05/06/2024)   [employer] of Occupational Health - Occupational Stress Questionnaire    Feeling of Stress: Not at all  Social Connections: Moderately Isolated (05/06/2024)   Social Connection and Isolation Panel    Frequency of Communication with Friends and Family: Three times a week    Frequency of Social Gatherings with Friends and Family: Never    Attends Religious Services: Never    Database administrator or Organizations: No    Attends Engineer, structural: Never    Marital Status: Married    Tobacco Counseling Counseling given: Not Answered    Clinical Intake:  Pre-visit preparation completed: Yes  Pain : No/denies pain     BMI - recorded: 31.41 Nutritional Status: BMI > 30  Obese Nutritional Risks: None Diabetes: No  Lab Results  Component Value Date   HGBA1C 5.6 11/27/2023   HGBA1C 5.9 01/07/2023     How often do you need to have someone help you when you read instructions, pamphlets, or other written materials from your doctor or pharmacy?: 1 - Never  Interpreter Needed?: No  Information entered by :: Verdie Saba, CMA   Activities of Daily Living     05/06/2024    3:06 PM  In your present state of health, do you have any difficulty performing the following activities:  Hearing? 0  Vision? 0  Difficulty concentrating or making decisions? 0  Walking or climbing stairs? 0  Dressing or bathing? 0  Doing errands, shopping? 0  Preparing Food and eating ? N  Using the Toilet? N  In the past six months, have you accidently  leaked urine? Y  Comment wears a pad - appt w/Urologist  Do you have problems with loss of bowel control? N  Managing your  Medications? N  Managing your Finances? N  Housekeeping or managing your Housekeeping? N    Patient Care Team: Alvia Corean CROME, FNP as PCP - General (Family Medicine) Wonda Sharper, MD as PCP - Cardiology (Cardiology) Wonda Sharper, MD as Consulting Physician (Cardiology) Abran Norleen SAILOR, MD as Consulting Physician (Gastroenterology) Jenel Carlin POUR, MD (Inactive) as Consulting Physician (Neurology) Cleotilde Katrina RAMAN, MD as Consulting Physician (Gynecology) Livingston Rigg, MD as Consulting Physician (Dermatology) Robinson Idol, MD as Consulting Physician (Ophthalmology)  I have updated your Care Teams any recent Medical Services you may have received from other providers in the past year.     Assessment:   This is a routine wellness examination for [facility].  Hearing/Vision screen Hearing Screening - Comments:: Denies hearing difficulties   Vision Screening - Comments:: Wears rx glasses - up to date with routine eye exams with  Dr Robinson   Goals Addressed               This Visit's Progress     Patient Stated (pt-stated)        Patient stated she plans to stay active - walk more       Depression Screen     05/06/2024    3:09 PM 12/02/2023    4:06 PM 07/31/2023    9:41 AM 07/29/2023    1:46 PM 07/20/2023    3:09 PM 04/17/2023   11:59 AM 03/10/2023    1:05 PM  PHQ 2/9 Scores  PHQ - 2 Score 0 0 0 0 0 0 0  PHQ- 9 Score 0  0 1 0      Fall Risk     05/06/2024    3:07 PM 01/28/2024    3:02 PM 07/31/2023    9:40 AM 03/10/2023    1:05 PM 01/07/2023    2:18 PM  Fall Risk   Falls in the past year? 1 0 0 0 1  Number falls in past yr: 0 0 0 0 0  Comment 1      Injury with Fall? 0 0 0 0 1  Risk for fall due to : Impaired balance/gait;History of fall(s)  No Fall Risks Impaired vision;Impaired balance/gait;Impaired mobility Impaired balance/gait;Impaired mobility  Follow up Falls evaluation completed;Falls prevention discussed Falls evaluation completed Falls evaluation  completed Falls prevention discussed Falls prevention discussed;Education provided    MEDICARE RISK AT HOME:  Medicare Risk at Home Any stairs in or around the home?: Yes If so, are there any without handrails?: No Home free of loose throw rugs in walkways, pet beds, electrical cords, etc?: Yes Adequate lighting in your home to reduce risk of falls?: Yes Life alert?: No Use of a cane, walker or w/c?: No Grab bars in the bathroom?: Yes Shower chair or bench in shower?: Yes Elevated toilet seat or a handicapped toilet?: No  TIMED UP AND GO:  Was the test performed?  No  Cognitive Function: 6CIT completed        05/06/2024    3:12 PM 03/10/2023    1:13 PM 12/16/2019    3:07 PM  6CIT Screen  What Year? 0 points 0 points 0 points  What month? 0 points 0 points 0 points  What time? 0 points 0 points 0 points  Count back from 20 0 points 0 points 0 points  Months in reverse  0 points 0 points 0 points  Repeat phrase 0 points 0 points 0 points  Total Score 0 points 0 points 0 points    Immunizations Immunization History  Administered Date(s) Administered    sv, Bivalent, Protein Subunit Rsvpref,pf [place]) 01/07/2023   Fluad Quad(high Dose 65+) 06/29/2019, 07/13/2020, 06/17/2022   Influenza, High Dose Seasonal PF 07/06/2015, 08/12/2018, 10/01/2021   PFIZER(Purple Top)SARS-COV-2 Vaccination 10/14/2019, 11/03/2019, 06/21/2020   Pfizer Covid-19 Vaccine Bivalent Booster 51yrs & up 10/01/2021   Pfizer(Comirnaty)Fall Seasonal Vaccine 12 years and older 01/07/2023   Pneumococcal Conjugate-13 10/05/2014   Pneumococcal Polysaccharide-23 09/01/2017   Tdap 07/07/2015   Zoster Recombinant(Shingrix ) 05/28/2018, 08/12/2018   Zoster, Live 10/04/2014    Screening Tests Health Maintenance  Topic Date Due   COVID-19 Vaccine (6 - 2024-25 season) 05/24/2023   INFLUENZA VACCINE  04/22/2024   Medicare Annual Wellness (AWV)  05/06/2025   DTaP/Tdap/Td (2 - Td or Tdap) 07/06/2025    Pneumococcal Vaccine: 50+ Years  Completed   DEXA SCAN  Completed   Hepatitis C Screening  Completed   Zoster Vaccines- Shingrix   Completed   HPV VACCINES  Aged Out   Meningococcal B Vaccine  Aged Out   Pneumococcal Vaccine  Discontinued   Colonoscopy  Discontinued    Health Maintenance  Health Maintenance Due  Topic Date Due   COVID-19 Vaccine (6 - 2024-25 season) 05/24/2023   INFLUENZA VACCINE  04/22/2024   Health Maintenance Items Addressed:  05/06/2024  Additional Screening:  Vision Screening: Recommended annual ophthalmology exams for early detection of glaucoma and other disorders of the eye. Would you like a referral to an eye doctor? No    Dental Screening: Recommended annual dental exams for proper oral hygiene  Community Resource Referral / Chronic Care Management: CRR required this visit?  No   CCM required this visit?  No   Plan:    I have personally reviewed and noted the following in the patient's chart:   Medical and social history Use of alcohol, tobacco or illicit drugs  Current medications and supplements including opioid prescriptions. Patient is not currently taking opioid prescriptions. Functional ability and status Nutritional status Physical activity Advanced directives List of other physicians Hospitalizations, surgeries, and ER visits in previous 12 months Vitals Screenings to include cognitive, depression, and falls Referrals and appointments  In addition, I have reviewed and discussed with patient certain preventive protocols, quality metrics, and best practice recommendations. A written personalized care plan for preventive services as well as general preventive health recommendations were provided to patient.   Verdie CHRISTELLA Saba, CMA   05/06/2024   After Visit Summary: (MyChart) Due to this being a telephonic visit, the after visit summary with patients personalized plan was offered to patient via MyChart   Notes: Scheduled a 1-yr  Physical for 06/07/2024 w/PCP.

## 2024-05-06 NOTE — Telephone Encounter (Signed)
 Pt called in today saying that she thought she was supposed to have a 3 month follow up.  I told her I would look in last note and didn't see it.  Does she need in person follow up in 3 months from last video visit?

## 2024-05-06 NOTE — Patient Instructions (Signed)
 Katrina Benitez , Thank you for taking time out of your busy schedule to complete your Annual Wellness Visit with me. I enjoyed our conversation and look forward to speaking with you again next year. I, as well as your care team,  appreciate your ongoing commitment to your health goals. Please review the following plan we discussed and let me know if I can assist you in the future. Your Game plan/ To Do List    Referrals: If you haven't heard from the office you've been referred to, please reach out to them at the phone provided.   Follow up Visits: We will see or speak with you next year for your Next Medicare AWV with our clinical staff Have you seen your provider in the last 6 months (3 months if uncontrolled diabetes)? No  Clinician Recommendations:  Aim for 30 minutes of exercise or brisk walking, 6-8 glasses of water, and 5 servings of fruits and vegetables each day.       This is a list of the screenings recommended for you:  Health Maintenance  Topic Date Due   COVID-19 Vaccine (6 - 2024-25 season) 05/24/2023   Flu Shot  04/22/2024   Medicare Annual Wellness Visit  05/06/2025   DTaP/Tdap/Td vaccine (2 - Td or Tdap) 07/06/2025   Pneumococcal Vaccine for age over 43  Completed   DEXA scan (bone density measurement)  Completed   Hepatitis C Screening  Completed   Zoster (Shingles) Vaccine  Completed   HPV Vaccine  Aged Out   Meningitis B Vaccine  Aged Out   Pneumococcal Vaccine  Discontinued   Colon Cancer Screening  Discontinued    Advanced directives: (Copy Requested) Please bring a copy of your health care power of attorney and living will to the office to be added to your chart at your convenience. You can mail to [facility] [address] [place], [place] [address] or email to [email or web address] Advance Care Planning is important because it:  [x]  Makes sure you receive the medical care that is consistent with your values, goals, and preferences  [x]  It  provides guidance to your family and loved ones and reduces their decisional burden about whether or not they are making the right decisions based on your wishes.  Follow the link provided in your after visit summary or read over the paperwork we have mailed to you to help you started getting your Advance Directives in place. If you need assistance in completing these, please reach out to us  so that we can help you!

## 2024-05-10 ENCOUNTER — Ambulatory Visit: Attending: Obstetrics | Admitting: Physical Therapy

## 2024-05-10 DIAGNOSIS — R279 Unspecified lack of coordination: Secondary | ICD-10-CM | POA: Diagnosis not present

## 2024-05-10 DIAGNOSIS — M6281 Muscle weakness (generalized): Secondary | ICD-10-CM | POA: Insufficient documentation

## 2024-05-10 DIAGNOSIS — M62838 Other muscle spasm: Secondary | ICD-10-CM | POA: Insufficient documentation

## 2024-05-10 DIAGNOSIS — R293 Abnormal posture: Secondary | ICD-10-CM | POA: Insufficient documentation

## 2024-05-10 NOTE — Therapy (Unsigned)
 OUTPATIENT PHYSICAL THERAPY FEMALE PELVIC TREATMENT   Patient Name: Katrina Benitez MRN: [id number] DOB:18-Dec-1945, 78 y.o., female Today's Date: 05/11/2024  END OF SESSION:  PT End of Session - 05/10/24 1537     Visit Number 9    Date for PT Re-Evaluation 06/21/24    Authorization Type MCR    Progress Note Due on Visit --   recert sent on 6th visit   PT Start Time 1535    PT Stop Time 1613    PT Time Calculation (min) 38 min    Activity Tolerance Patient tolerated treatment well    Behavior During Therapy WFL for tasks assessed/performed           Past Medical History:  Diagnosis Date   Allergy    Anemia    Anxiety    Arthritis    degenerative disc disorder   Cancer (HCC)    skin cancer   Cardiac arrhythmia    Cataract    surgery in right eye in 03/2022   Diverticulitis    Diverticulosis    GERD (gastroesophageal reflux disease)    Heart murmur    HTN (hypertension)    not on meds in 2 years per pt on 05/19/22 n   Infertility, female    Neuromuscular disorder (HCC)    idiopathic peripheral  neuropathy   Peripheral neuropathy    Positive PPD    SIADH (syndrome of inappropriate ADH production) (HCC)    Past Surgical History:  Procedure Laterality Date   BREAST BIOPSY Left    BREAST EXCISIONAL BIOPSY Left    benign   CATARACT EXTRACTION Right 2023   CERVICAL BIOPSY  W/ LOOP ELECTRODE EXCISION     CERVICAL POLYPECTOMY  02/2016   CHOLECYSTECTOMY N/A 06/02/2022   Procedure: LAPAROSCOPIC CHOLECYSTECTOMY WITH INTRAOPERATIVE CHOLANGIOGRAM;  Surgeon: Eletha Boas, MD;  Location: [facility];  Service: General;  Laterality: N/A;   COLONOSCOPY     COLPOSCOPY     DILATION AND CURETTAGE OF UTERUS     HYSTEROSCOPY     LOOP RECORDER IMPLANT     LUMBAR DISC SURGERY     LUMBAR LAMINECTOMY     TONSILLECTOMY     UPPER GASTROINTESTINAL ENDOSCOPY     Patient Active Problem List   Diagnosis Date Noted   Acute bilateral low back pain without sciatica 02/28/2024   Hyponatremia  02/28/2024   Recurrent UTI 02/28/2024   Bladder pain 12/10/2023   Urge urinary incontinence 12/10/2023   History of recurrent UTI (urinary tract infection) 12/10/2023   Vaginal atrophy 12/10/2023   Constipation 12/10/2023   Prediabetes 07/30/2023   Medication management 01/08/2023   Abdominal pain, right upper quadrant 05/18/2022   Symptomatic cholelithiasis 05/18/2022   Syncope and collapse 03/05/2022   Paroxysmal atrial fibrillation (HCC) 01/27/2022   Increased risk of breast cancer 04/18/2021   Anemia 01/09/2021   Pruritus 11/14/2019   Depression, recurrent (HCC) 02/18/2019   Primary insomnia 02/18/2019   Atrial fibrillation with RVR (HCC) 01/19/2019   Hyperlipidemia 09/30/2018   Post herpetic neuralgia 08/12/2018   Idiopathic peripheral neuropathy 08/12/2018   Vitamin D  deficiency 07/22/2018   Vulvar pruritus 04/16/2016   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   Opacity noted on imaging study    Chest pain 01/21/2016   SIADH (syndrome of inappropriate ADH production) (HCC) 01/21/2016   Hypertension 01/21/2016   Anxiety 01/21/2016   GERD (gastroesophageal reflux disease) 01/21/2016   Gait abnormality 12/12/2015    PCP: Alvia Corean CROME, FNP  REFERRING PROVIDER: Alvia Corean  L, FNP  REFERRING DIAG: M62.89 (ICD-10-CM) - Pelvic floor dysfunction in female  THERAPY DIAG:  Other muscle spasm  Muscle weakness (generalized)  Unspecified lack of coordination  Abnormal posture  Rationale for Evaluation and Treatment: Rehabilitation  ONSET DATE: 3 years  SUBJECTIVE:                                                                                                                                                                                           SUBJECTIVE STATEMENT: Pt reports she has done very well since last appointment with internal,  much less burning and only had one large loss of urine and one instance of fecal incontinence since last appointment. Does have a  little urge incontinence intermittently.   PAIN:  None at this time. But at night: Are you having pain? Yes during night NPRS scale: 10/10 Pain location: low/mid back  Pain type: stiffness Pain description:intermittent  Aggravating factors: sitting, lying down Relieving factors: stretching and mobility  PRECAUTIONS: None  RED FLAGS: None   WEIGHT BEARING RESTRICTIONS: No  FALLS:  Has patient fallen in last 6 months? No  OCCUPATION: retired - physician   ACTIVITY LEVEL : lower   PLOF: Independent  PATIENT GOALS: to have less leakage  PERTINENT HISTORY:  Vulvodynia, Vulvar pruritus, Anxiety Sexual abuse: No  BOWEL MOVEMENT: Pain with bowel movement: Yes Type of bowel movement:Type (Bristol Stool Scale) vary, Frequency every day, and Strain yes Fully empty rectum: No Leakage: No Pads: No Fiber supplement/laxative miralax   URINATION: Pain with urination: Yes Fully empty bladder: No Stream: Strong and Weak Urgency: Yes  Frequency: sometimes every two hours; 1-3x nightly Leakage: Urge to void and Walking to the bathroom Pads: No  INTERCOURSE:  Ability to have vaginal penetration Yes  Pain with intercourse: none DrynessYes  Climax: not active   PREGNANCY: Vaginal deliveries 0  C-section deliveries 0   PROLAPSE: None   OBJECTIVE:  Note: Objective measures were completed at Evaluation unless otherwise noted.  DIAGNOSTIC FINDINGS:    COGNITION: Overall cognitive status: Within functional limits for tasks assessed     SENSATION: Light touch: Appears intact   FUNCTIONAL TESTS:  Functional squat - descent limited by 25%  GAIT: WFL  POSTURE: rounded shoulders, forward head, and posterior pelvic tilt   LUMBARAROM/PROM:  A/PROM A/PROM  eval  Flexion WFL  Extension WFL  Right lateral flexion Limited by 25%  Left lateral flexion Limited by 25%  Right rotation Limited by 25%  Left rotation Limited by 25%   (Blank rows = not  tested)  LOWER EXTREMITY ROM:  Bil hamstrings and adductors limited by 25%  LOWER EXTREMITY MMT:  Bil hips grossly 4/5; knees 4+/5 PALPATION:   General: tightness at bil lumbar paraspinals   Pelvic Alignment: WFL  Abdominal: tightness noted in abdominal lower quadrants but no TTP                External Perineal Exam: dryness noted at vulva, age related changes noted.                              Internal Pelvic Floor: mild TTP at bil obturators, tightness noted throughout deep and superficial layers  Patient confirms identification and approves PT to assess internal pelvic floor and treatment Yes No emotional/communication barriers or cognitive limitation. Patient is motivated to learn. Patient understands and agrees with treatment goals and plan. PT explains patient will be examined in standing, sitting, and lying down to see how their muscles and joints work. When they are ready, they will be asked to remove their underwear so PT can examine their perineum. The patient is also given the option of providing their own chaperone as one is not provided in our facility. The patient also has the right and is explained the right to defer or refuse any part of the evaluation or treatment including the internal exam. With the patient's consent, PT will use one gloved finger to gently assess the muscles of the pelvic floor, seeing how well it contracts and relaxes and if there is muscle symmetry. After, the patient will get dressed and PT and patient will discuss exam findings and plan of care. PT and patient discuss plan of care, schedule, attendance policy and HEP activities.  PELVIC MMT:   MMT eval  Vaginal 3/5 (but tightness throughout and decreased ability to relax pelvic floor)  Internal Anal Sphincter   External Anal Sphincter   Puborectalis   Diastasis Recti   (Blank rows = not tested)        TONE: Increased throughout   PROLAPSE:  Not seen TODAY'S TREATMENT:                                                                                                                               DATE:   03/23/24: Pt educated on voiding mechanics with squatty potty, abdominal massage continuation, and exhale and pelvic floor relaxation instead of pushing, pelvic mobility at toilet to improve output.  Pelvic floor relaxation video educated on Vibration plate 1 min standing low setting, 2x1 min sitting low setting for improved pelvic floor relaxation for improved voiding.  Pt also educated on possible benefits from additional internal pelvic floor treatment for improved mobility as pt reported improved after last session. However pt declined today reporting feeling full with bladder and bowel.   04/21/24: Patient consented to internal pelvic floor treatment vaginally this date and found to have tension at anterior pelvic floor slightly more so at anterior. Manual here for improved mobility and decreased  burning. Also externally at clitoris pt found to have mild tension in rotational directions and upward with gentle mobility completed which again today, pt reports replicates burning pain, though not as strong. Manual here with bimanual at vaginal downward stretch completed with improved mobility noted.  Pt educated to continue HEP and mobility exercises with relaxation cues as pt did report she felt replicated pain with stretch and improvement after.   05/10/24: Patient consented to internal pelvic floor treatment vaginally this date and found to have tension at anterior pelvic floor slightly more so at anterior and bil obturators. Manual here for improved mobility and decreased burning. Bimanual mobility at abdomen for improved tissue mobility. Also externally at clitoris pt found to have mild tension in rotational directions and upward with gentle mobility completed which again today, pt reports replicates burning pain, though not as strong. Manual here with bimanual at vaginal downward stretch  completed with improved mobility noted.    PATIENT EDUCATION:  Education details: [id number], fiber types Person educated: Patient Education method: Explanation, Demonstration, Tactile cues, Verbal cues, and Handouts Education comprehension: verbalized understanding, returned demonstration, verbal cues required, tactile cues required, and needs further education  HOME EXERCISE PROGRAM: [id number]  ASSESSMENT:  CLINICAL IMPRESSION: Patient is a 78 y.o. female  who was seen today for physical therapy treatment for pelvic floor tension, urinary burning and pain, and constipation. Session focused on manual for decreased burning and pelvic pain. Pt does report great improvement with bowels and bladder and pain since last appointment and reports no pain at end of session.[name] and would benefit from additional PT to further address deficits.    OBJECTIVE IMPAIRMENTS: decreased activity tolerance, decreased coordination, decreased knowledge of condition, decreased mobility, decreased strength, increased fascial restrictions, increased muscle spasms, impaired flexibility, improper body mechanics, and postural dysfunction.   ACTIVITY LIMITATIONS: continence and locomotion level  PARTICIPATION LIMITATIONS: community activity  PERSONAL FACTORS: Time since onset of injury/illness/exacerbation are also affecting patient's functional outcome.   REHAB POTENTIAL: Good  CLINICAL DECISION MAKING: Stable/uncomplicated  EVALUATION COMPLEXITY: Low   GOALS: Goals reviewed with patient? Yes  SHORT TERM GOALS: Target date: 01/25/23  Pt to be I with HEP.  Baseline: Goal status: MET  2.  Pt to be I with relaxation techniques for decreased strain at pelvic floor. Baseline:  Goal status: MET  3.  Pt will be independent with use of squatty potty, relaxed toileting mechanics, and improved bowel movement techniques in order to increase ease of bowel movements and complete evacuation.   Baseline:  Goal status:  MET  4.  Pt will be able to teach back and utilize urge suppression technique in order to help reduce number of trips to the bathroom.    Baseline:  Goal status: MET  LONG TERM GOALS: Target date: 03/28/24  Pt to be I with advanced HEP.  Baseline:  Goal status: on going  2.  Pt to report no more than 1 instance of urinary incontinence in a week due to improved pelvic floor tissue mobility and I with urge suppression techniques.  Baseline:  Goal status: on going  3.  Pt to report no fecal leakage in at least 1 month due to improved evacuation habits.  Baseline:  Goal status: on going  4.  Pt to demonstrate no restrictions at lumbar spine and bil hips for decreased strain at pelvic floor.  Baseline:  Goal status: on going  5.  Pt to demonstrate at least 4/5 pelvic floor strength and ability to fully  relax after each contraction for improved pelvic stability and decreased strain at pelvic floor/ decrease leakage.  Baseline:  Goal status: on going  6.  Pt to demonstrate improved coordination of pelvic floor and breathing mechanics with 10# squat with appropriate synergistic patterns to decrease pain and leakage at least 75% of the time.    Baseline:  Goal status: on going  PLAN:  PT FREQUENCY: 1-2x/week  PT DURATION: 15 sessions  PLANNED INTERVENTIONS: 97110-Therapeutic exercises, 97530- Therapeutic activity, 97112- Neuromuscular re-education, 97535- Self Care, [id number]- Manual therapy, Patient/Family education, Taping, Dry Needling, Joint mobilization, Spinal mobilization, Scar mobilization, DME instructions, Cryotherapy, Moist heat, and Biofeedback  PLAN FOR NEXT SESSION: internal as needed, hip and trunk stretches, relaxation techniques    Darryle Navy, PT, DPT 08/20/258:00 AM

## 2024-05-16 ENCOUNTER — Ambulatory Visit: Payer: Medicare Other

## 2024-05-24 ENCOUNTER — Other Ambulatory Visit: Payer: Self-pay

## 2024-05-24 ENCOUNTER — Encounter (HOSPITAL_BASED_OUTPATIENT_CLINIC_OR_DEPARTMENT_OTHER): Payer: Self-pay

## 2024-05-24 ENCOUNTER — Emergency Department (HOSPITAL_BASED_OUTPATIENT_CLINIC_OR_DEPARTMENT_OTHER)
Admission: EM | Admit: 2024-05-24 | Discharge: 2024-05-24 | Discharge status: Against Medical Advice | Attending: Emergency Medicine | Admitting: Emergency Medicine

## 2024-05-24 ENCOUNTER — Emergency Department (HOSPITAL_BASED_OUTPATIENT_CLINIC_OR_DEPARTMENT_OTHER)

## 2024-05-24 DIAGNOSIS — Z5321 Procedure and treatment not carried out due to patient leaving prior to being seen by health care provider: Secondary | ICD-10-CM | POA: Insufficient documentation

## 2024-05-24 DIAGNOSIS — Y92002 Bathroom of unspecified non-institutional (private) residence single-family (private) house as the place of occurrence of the external cause: Secondary | ICD-10-CM | POA: Diagnosis not present

## 2024-05-24 DIAGNOSIS — M25532 Pain in left wrist: Secondary | ICD-10-CM | POA: Diagnosis not present

## 2024-05-24 DIAGNOSIS — S52572A Other intraarticular fracture of lower end of left radius, initial encounter for closed fracture: Secondary | ICD-10-CM | POA: Diagnosis not present

## 2024-05-24 DIAGNOSIS — W19XXXA Unspecified fall, initial encounter: Secondary | ICD-10-CM | POA: Insufficient documentation

## 2024-05-24 DIAGNOSIS — M25552 Pain in left hip: Secondary | ICD-10-CM | POA: Diagnosis not present

## 2024-05-24 DIAGNOSIS — M19032 Primary osteoarthritis, left wrist: Secondary | ICD-10-CM | POA: Diagnosis not present

## 2024-05-24 DIAGNOSIS — Z7901 Long term (current) use of anticoagulants: Secondary | ICD-10-CM | POA: Insufficient documentation

## 2024-05-24 NOTE — ED Triage Notes (Signed)
 Arrives POV with complaints of having a mechanical fall today. Patient fell in the restroom while taking off her sock. She is reporting left wrist pain and left hip pain. No head injuries. She does take Eliquis .

## 2024-05-25 DIAGNOSIS — S52502A Unspecified fracture of the lower end of left radius, initial encounter for closed fracture: Secondary | ICD-10-CM | POA: Diagnosis not present

## 2024-05-26 ENCOUNTER — Other Ambulatory Visit: Payer: Self-pay | Admitting: Family Medicine

## 2024-05-26 DIAGNOSIS — F419 Anxiety disorder, unspecified: Secondary | ICD-10-CM

## 2024-05-27 ENCOUNTER — Telehealth: Payer: Self-pay | Admitting: *Deleted

## 2024-05-27 NOTE — Telephone Encounter (Signed)
 Copied from CRM [id number]. Topic: Clinical - Medication Question >> May 27, 2024 10:25 AM [name] wrote: Reason for CRM: Patient states that she broke her wrist and is needing the VALIUM . She stated that she called it in on Wednesday to the Pharmacy.  She is wanting to know it can be filled today.

## 2024-06-05 DIAGNOSIS — Z0000 Encounter for general adult medical examination without abnormal findings: Secondary | ICD-10-CM | POA: Insufficient documentation

## 2024-06-05 NOTE — Progress Notes (Deleted)
 Annual Physical Exam  Subjective:     Patient ID: Katrina Benitez, female    DOB: [birth year], 78 y.o.   MRN: [id number]  No chief complaint on file.   HPI  Discussed the use of AI scribe software for clinical note transcription with the patient, who gave verbal consent to proceed.  History of Present Illness      ROS Per HPI  Most recent fall risk assessment:    05/06/2024    3:07 PM  Fall Risk   Falls in the past year? 1  Number falls in past yr: 0  Comment 1  Injury with Fall? 0  Risk for fall due to : Impaired balance/gait;History of fall(s)  Follow up Falls evaluation completed;Falls prevention discussed    Most recent depression screenings:    05/06/2024    3:09 PM 12/02/2023    4:06 PM  PHQ 2/9 Scores  PHQ - 2 Score 0 0  PHQ- 9 Score 0     {VISON DENTAL STD PSA (Optional):27386}  Patient Care Team: Alvia Corean CROME, FNP as PCP - General (Family Medicine) Wonda Sharper, MD as PCP - Cardiology (Cardiology) Wonda Sharper, MD as Consulting Physician (Cardiology) Abran Norleen SAILOR, MD as Consulting Physician (Gastroenterology) Jenel Carlin POUR, MD (Inactive) as Consulting Physician (Neurology) Cleotilde Ronal RAMAN, MD as Consulting Physician (Gynecology) Livingston Rigg, MD as Consulting Physician (Dermatology) Robinson Idol, MD as Consulting Physician (Ophthalmology)   Outpatient Medications Prior to Visit  Medication Sig   Alum Hydroxide-Mag Carbonate (GAVISCON EXTRA STRENGTH) 160-105 MG CHEW Chew 2 tablets by mouth daily as needed (upset stomach).   betamethasone  valerate (VALISONE ) 0.1 % cream APPLY TWICE DAILY   betamethasone  valerate ointment (VALISONE ) 0.1 % Apply 1 Application topically 2 (two) times daily as needed (itching).   Cholecalciferol 50 MCG (2000 UT) CAPS Take 4,000 Units by mouth daily.   Cyanocobalamin  (VITAMIN B 12 PO) Take by mouth.   diazepam  (VALIUM ) 10 MG tablet TAKE ONE TABLET EVERY TWELVE HOURS AS NEEDED FOR ANXIETY    diphenoxylate -atropine  (LOMOTIL ) 2.5-0.025 MG tablet TAKE ONE (1) TABLET FOUR TIMES EACH DAY AS NEEDED FOR DIARRHEA OR LOOSE STOOLS   ELIQUIS  5 MG TABS tablet TAKE ONE TABLET BY MOUTH TWICE DAILY   estradiol  (ESTRACE ) 0.1 MG/GM vaginal cream 1 gram vaginally nightly x 7 - 10 days and then twice weekly   furosemide  (LASIX ) 20 MG tablet Take 1 tablet (20 mg total) by mouth daily.   gabapentin  (NEURONTIN ) 600 MG tablet TAKE ONE TABLET BY MOUTH THREE TIMES DAILY   hydrOXYzine  (VISTARIL ) 25 MG capsule Two capsules 4 times daily   lidocaine  (LMX) 4 % cream Use 0.5g peasize up to 3 times a day as needed for pain   methenamine  (HIPREX ) 1 g tablet Take 1 tablet (1 g total) by mouth daily.   mupirocin  ointment (BACTROBAN ) 2 % Apply 1 Application topically 2 (two) times daily.   omeprazole  (PRILOSEC) 40 MG capsule TAKE ONE CAPSULE EACH DAY   ondansetron  (ZOFRAN -ODT) 8 MG disintegrating tablet TAKE ONE TABLET EVERY EIGHT HOURS AS NEEDED FOR NAUSEA AND VOMITING   Polyethyl Glyc-Propyl Glyc PF (SYSTANE HYDRATION PF) 0.4-0.3 % SOLN Place 1 drop into both eyes daily as needed (dry eyes).   Pramoxine HCl (CERAVE ITCH RELIEF) 1 % CREA Apply 1 Application topically daily as needed (itching).   Probiotic Product (ALIGN PO) Take 1 capsule by mouth daily.   sodium chloride  1 g tablet Take 1 g by mouth 2 (two) times daily  with a meal.   Thiamine  HCl (VITAMIN B1 PO) Take by mouth.   triamcinolone  cream (KENALOG ) 0.1 % Apply 1 Application topically as needed (itching).   zolpidem  (AMBIEN ) 10 MG tablet TAKE ONE TABLET BY MOUTH AT BEDTIME AS NEEDED FOR SLEEP   No facility-administered medications prior to visit.       Objective:    LMP  (LMP Unknown)    Physical Exam Vitals and nursing note reviewed.  Constitutional:      General: She is not in acute distress.    Appearance: Normal appearance. She is normal weight.  HENT:     Head: Normocephalic and atraumatic.     Right Ear: External ear normal.     Left  Ear: External ear normal.     Nose: Nose normal.     Mouth/Throat:     Mouth: Mucous membranes are moist.     Pharynx: Oropharynx is clear.  Eyes:     Extraocular Movements: Extraocular movements intact.     Pupils: Pupils are equal, round, and reactive to light.  Cardiovascular:     Rate and Rhythm: Normal rate and regular rhythm.     Pulses: Normal pulses.     Heart sounds: Normal heart sounds.  Pulmonary:     Effort: Pulmonary effort is normal. No respiratory distress.     Breath sounds: Normal breath sounds. No wheezing, rhonchi or rales.  Musculoskeletal:        General: Normal range of motion.     Cervical back: Normal range of motion.     Right lower leg: No edema.     Left lower leg: No edema.  Lymphadenopathy:     Cervical: No cervical adenopathy.  Neurological:     General: No focal deficit present.     Mental Status: She is alert and oriented to person, place, and time.  Psychiatric:        Mood and Affect: Mood normal.        Thought Content: Thought content normal.     No results found for any visits on 06/07/24.  {Vitals History (Optional):23777}  {Show previous labs (optional):23779}     Assessment & Plan:   Assessment and Plan Assessment & Plan      Health Maintenance  Topic Date Due   Flu Shot  04/22/2024   COVID-19 Vaccine (6 - 2025-26 season) 05/23/2024   Medicare Annual Wellness Visit  05/06/2025   DTaP/Tdap/Td vaccine (2 - Td or Tdap) 07/06/2025   Pneumococcal Vaccine for age over 82  Completed   DEXA scan (bone density measurement)  Completed   Hepatitis C Screening  Completed   Zoster (Shingles) Vaccine  Completed   HPV Vaccine  Aged Out   Meningitis B Vaccine  Aged Out   Breast Cancer Screening  Discontinued   Colon Cancer Screening  Discontinued     Discussed health benefits of physical activity, and encouraged her to engage in regular exercise appropriate for her age and condition.  No orders of the defined types were placed  in this encounter.    No orders of the defined types were placed in this encounter.   No follow-ups on file.  Corean LITTIE Ku, FNP

## 2024-06-05 NOTE — Patient Instructions (Incomplete)
 We have completed your physical today.   We are checking labs today, will be in contact with any results that require further attention  I have ordered the next 6 months of metabolic panels to check your sodium today  Continue current medication regimen.   Follow-up with me in 6 mos for medication management, sooner if needed.

## 2024-06-06 ENCOUNTER — Ambulatory Visit (INDEPENDENT_AMBULATORY_CARE_PROVIDER_SITE_OTHER)

## 2024-06-06 DIAGNOSIS — I480 Paroxysmal atrial fibrillation: Secondary | ICD-10-CM | POA: Diagnosis not present

## 2024-06-07 ENCOUNTER — Ambulatory Visit: Admitting: Family Medicine

## 2024-06-07 DIAGNOSIS — I10 Essential (primary) hypertension: Secondary | ICD-10-CM

## 2024-06-07 DIAGNOSIS — N390 Urinary tract infection, site not specified: Secondary | ICD-10-CM

## 2024-06-07 DIAGNOSIS — Z0000 Encounter for general adult medical examination without abnormal findings: Secondary | ICD-10-CM

## 2024-06-07 DIAGNOSIS — E871 Hypo-osmolality and hyponatremia: Secondary | ICD-10-CM

## 2024-06-07 DIAGNOSIS — I480 Paroxysmal atrial fibrillation: Secondary | ICD-10-CM

## 2024-06-07 DIAGNOSIS — K219 Gastro-esophageal reflux disease without esophagitis: Secondary | ICD-10-CM

## 2024-06-07 DIAGNOSIS — E222 Syndrome of inappropriate secretion of antidiuretic hormone: Secondary | ICD-10-CM

## 2024-06-07 LAB — CUP PACEART REMOTE DEVICE CHECK
Date Time Interrogation Session: 20250913231032
Implantable Pulse Generator Implant Date: 20230614

## 2024-06-08 ENCOUNTER — Encounter: Payer: Self-pay | Admitting: Physical Therapy

## 2024-06-08 DIAGNOSIS — S52502A Unspecified fracture of the lower end of left radius, initial encounter for closed fracture: Secondary | ICD-10-CM | POA: Diagnosis not present

## 2024-06-10 ENCOUNTER — Ambulatory Visit: Payer: Self-pay | Admitting: Internal Medicine

## 2024-06-13 NOTE — Progress Notes (Signed)
 Remote Loop Recorder Transmission

## 2024-06-14 DIAGNOSIS — M259 Joint disorder, unspecified: Secondary | ICD-10-CM | POA: Diagnosis not present

## 2024-06-14 DIAGNOSIS — M545 Low back pain, unspecified: Secondary | ICD-10-CM | POA: Diagnosis not present

## 2024-06-15 ENCOUNTER — Ambulatory Visit: Payer: Self-pay | Admitting: Physical Therapy

## 2024-06-15 NOTE — Progress Notes (Signed)
 Remote Loop Recorder Transmission

## 2024-06-16 ENCOUNTER — Ambulatory Visit: Admitting: Family Medicine

## 2024-06-17 ENCOUNTER — Other Ambulatory Visit: Payer: Self-pay | Admitting: Orthopedic Surgery

## 2024-06-17 DIAGNOSIS — M259 Joint disorder, unspecified: Secondary | ICD-10-CM

## 2024-06-20 ENCOUNTER — Ambulatory Visit: Payer: Medicare Other

## 2024-06-22 DIAGNOSIS — S52502A Unspecified fracture of the lower end of left radius, initial encounter for closed fracture: Secondary | ICD-10-CM | POA: Diagnosis not present

## 2024-06-23 ENCOUNTER — Encounter: Payer: Self-pay | Admitting: Family Medicine

## 2024-06-23 ENCOUNTER — Ambulatory Visit (INDEPENDENT_AMBULATORY_CARE_PROVIDER_SITE_OTHER): Admitting: Family Medicine

## 2024-06-23 VITALS — BP 130/72 | HR 78 | Temp 98.3°F | Ht 64.0 in | Wt 183.0 lb

## 2024-06-23 DIAGNOSIS — E66811 Obesity, class 1: Secondary | ICD-10-CM | POA: Diagnosis not present

## 2024-06-23 DIAGNOSIS — F411 Generalized anxiety disorder: Secondary | ICD-10-CM | POA: Diagnosis not present

## 2024-06-23 DIAGNOSIS — Z23 Encounter for immunization: Secondary | ICD-10-CM

## 2024-06-23 DIAGNOSIS — K5909 Other constipation: Secondary | ICD-10-CM | POA: Diagnosis not present

## 2024-06-23 DIAGNOSIS — E222 Syndrome of inappropriate secretion of antidiuretic hormone: Secondary | ICD-10-CM

## 2024-06-23 DIAGNOSIS — I4891 Unspecified atrial fibrillation: Secondary | ICD-10-CM | POA: Diagnosis not present

## 2024-06-23 DIAGNOSIS — M533 Sacrococcygeal disorders, not elsewhere classified: Secondary | ICD-10-CM | POA: Diagnosis not present

## 2024-06-23 DIAGNOSIS — F419 Anxiety disorder, unspecified: Secondary | ICD-10-CM

## 2024-06-23 DIAGNOSIS — I10 Essential (primary) hypertension: Secondary | ICD-10-CM | POA: Diagnosis not present

## 2024-06-23 DIAGNOSIS — F5101 Primary insomnia: Secondary | ICD-10-CM | POA: Diagnosis not present

## 2024-06-23 DIAGNOSIS — E559 Vitamin D deficiency, unspecified: Secondary | ICD-10-CM | POA: Diagnosis not present

## 2024-06-23 DIAGNOSIS — N319 Neuromuscular dysfunction of bladder, unspecified: Secondary | ICD-10-CM

## 2024-06-23 DIAGNOSIS — Z0000 Encounter for general adult medical examination without abnormal findings: Secondary | ICD-10-CM

## 2024-06-23 DIAGNOSIS — E782 Mixed hyperlipidemia: Secondary | ICD-10-CM

## 2024-06-23 DIAGNOSIS — S52502A Unspecified fracture of the lower end of left radius, initial encounter for closed fracture: Secondary | ICD-10-CM | POA: Diagnosis not present

## 2024-06-23 DIAGNOSIS — Z79899 Other long term (current) drug therapy: Secondary | ICD-10-CM

## 2024-06-23 MED ORDER — DIAZEPAM 10 MG PO TABS: ORAL_TABLET | ORAL | 0 refills | Status: DC

## 2024-06-23 MED ORDER — ZOLPIDEM TARTRATE 10 MG PO TABS
10.0000 mg | ORAL_TABLET | Freq: Every evening | ORAL | 1 refills | Status: AC | PRN
Start: 2024-06-23 — End: ?

## 2024-06-23 NOTE — Progress Notes (Signed)
 Annual Physical Exam  Subjective:     Patient ID: Katrina Benitez, female    DOB: 08/08/1946, 78 y.o.   MRN: [id number]  No chief complaint on file.   HPI  Discussed the use of AI scribe software for clinical note transcription with the patient, who gave verbal consent to proceed.  History of Present Illness Katrina Benitez is a 78 year old female who presents for medication management and follow-up on multiple health concerns.  Gastrointestinal symptoms - Severe abdominal pain and nausea lasting two days every few weeks - Gastric emptying study demonstrated delayed gastric emptying - Uses Metamucil and Miralax with variable efficacy - Cautious about weight loss medications due to gastrointestinal issues  Lymphedema - Requires use of a pump for management - Managed with Lasix  20 mg twice daily with good results  Sacroiliac joint pain - Pain interferes with ability to sleep in a bed, necessitating sleep in a recliner - Awaiting CT-guided injection at [facility] due to scheduling delays  Bladder dysfunction - Improvement in symptoms with no accidents in almost two weeks - Continues home exercises - Uses estrogen twice weekly  Weight gain - Concerned about weight gain of over fifty pounds since moving     ROS Per HPI  Most recent fall risk assessment:    05/06/2024    3:07 PM  Fall Risk   Falls in the past year? 1  Number falls in past yr: 0  Comment 1  Injury with Fall? 0  Risk for fall due to : Impaired balance/gait;History of fall(s)  Follow up Falls evaluation completed;Falls prevention discussed    Most recent depression screenings:    05/06/2024    3:09 PM 12/02/2023    4:06 PM  PHQ 2/9 Scores  PHQ - 2 Score 0 0  PHQ- 9 Score 0     Vision:Within last year and Dental: No current dental problems and Receives regular dental care  Patient Care Team: Alvia Corean CROME, FNP as PCP - General (Family Medicine) Wonda Sharper, MD  as PCP - Cardiology (Cardiology) Wonda Sharper, MD as Consulting Physician (Cardiology) Abran Norleen SAILOR, MD as Consulting Physician (Gastroenterology) Jenel Carlin POUR, MD (Inactive) as Consulting Physician (Neurology) Cleotilde Ronal RAMAN, MD as Consulting Physician (Gynecology) Livingston Rigg, MD as Consulting Physician (Dermatology) Robinson Idol, MD as Consulting Physician (Ophthalmology)   Outpatient Medications Prior to Visit  Medication Sig   Alum Hydroxide-Mag Carbonate (GAVISCON EXTRA STRENGTH) 160-105 MG CHEW Chew 2 tablets by mouth daily as needed (upset stomach).   betamethasone  valerate (VALISONE ) 0.1 % cream APPLY TWICE DAILY   betamethasone  valerate ointment (VALISONE ) 0.1 % Apply 1 Application topically 2 (two) times daily as needed (itching).   Cholecalciferol 50 MCG (2000 UT) CAPS Take 4,000 Units by mouth daily.   Cyanocobalamin  (VITAMIN B 12 PO) Take by mouth.   diphenoxylate -atropine  (LOMOTIL ) 2.5-0.025 MG tablet TAKE ONE (1) TABLET FOUR TIMES EACH DAY AS NEEDED FOR DIARRHEA OR LOOSE STOOLS   ELIQUIS  5 MG TABS tablet TAKE ONE TABLET BY MOUTH TWICE DAILY   estradiol  (ESTRACE ) 0.1 MG/GM vaginal cream 1 gram vaginally nightly x 7 - 10 days and then twice weekly   furosemide  (LASIX ) 20 MG tablet Take 1 tablet (20 mg total) by mouth daily.   gabapentin  (NEURONTIN ) 600 MG tablet TAKE ONE TABLET BY MOUTH THREE TIMES DAILY   hydrOXYzine  (VISTARIL ) 25 MG capsule Two capsules 4 times daily   lidocaine  (LMX) 4 % cream Use 0.5g peasize  up to 3 times a day as needed for pain   methenamine  (HIPREX ) 1 g tablet Take 1 tablet (1 g total) by mouth daily.   mupirocin  ointment (BACTROBAN ) 2 % Apply 1 Application topically 2 (two) times daily.   omeprazole  (PRILOSEC) 40 MG capsule TAKE ONE CAPSULE EACH DAY   ondansetron  (ZOFRAN -ODT) 8 MG disintegrating tablet TAKE ONE TABLET EVERY EIGHT HOURS AS NEEDED FOR NAUSEA AND VOMITING   Polyethyl Glyc-Propyl Glyc PF (SYSTANE HYDRATION PF) 0.4-0.3 % SOLN  Place 1 drop into both eyes daily as needed (dry eyes).   Pramoxine HCl (CERAVE ITCH RELIEF) 1 % CREA Apply 1 Application topically daily as needed (itching).   Probiotic Product (ALIGN PO) Take 1 capsule by mouth daily.   sodium chloride  1 g tablet Take 1 g by mouth 2 (two) times daily with a meal.   Thiamine  HCl (VITAMIN B1 PO) Take by mouth.   triamcinolone  cream (KENALOG ) 0.1 % Apply 1 Application topically as needed (itching).   [DISCONTINUED] diazepam  (VALIUM ) 10 MG tablet TAKE ONE TABLET EVERY TWELVE HOURS AS NEEDED FOR ANXIETY   [DISCONTINUED] zolpidem  (AMBIEN ) 10 MG tablet TAKE ONE TABLET BY MOUTH AT BEDTIME AS NEEDED FOR SLEEP   No facility-administered medications prior to visit.       Objective:    BP 130/72 (BP Location: Left Arm, Patient Position: Sitting)   Pulse 78   Temp 98.3 F (36.8 C) (Temporal)   Ht 5' 4 (1.626 m)   Wt 183 lb (83 kg)   LMP  (LMP Unknown)   SpO2 100%   BMI 31.41 kg/m    Physical Exam Vitals and nursing note reviewed.  Constitutional:      General: She is not in acute distress.    Appearance: Normal appearance.  HENT:     Head: Normocephalic and atraumatic.     Right Ear: External ear normal.     Left Ear: External ear normal.     Nose: Nose normal.     Mouth/Throat:     Mouth: Mucous membranes are moist.     Pharynx: Oropharynx is clear.  Eyes:     Extraocular Movements: Extraocular movements intact.     Pupils: Pupils are equal, round, and reactive to light.  Neck:     Vascular: No carotid bruit.  Cardiovascular:     Rate and Rhythm: Normal rate and regular rhythm.     Pulses: Normal pulses.     Heart sounds: Normal heart sounds.  Pulmonary:     Effort: Pulmonary effort is normal. No respiratory distress.     Breath sounds: Normal breath sounds. No wheezing, rhonchi or rales.  Abdominal:     General: There is no distension.     Palpations: There is no mass.     Tenderness: There is no abdominal tenderness. There is no  guarding or rebound.     Hernia: No hernia is present.  Musculoskeletal:     Cervical back: Normal range of motion.     Right lower leg: No edema.     Left lower leg: No edema.     Comments: L arm in cast and sling  Lymphadenopathy:     Cervical: No cervical adenopathy.  Neurological:     General: No focal deficit present.     Mental Status: She is alert and oriented to person, place, and time.  Psychiatric:        Mood and Affect: Mood normal.        Thought  Content: Thought content normal.     No results found for any visits on 06/23/24.  BP Readings from Last 3 Encounters:  06/23/24 130/72  05/24/24 (!) 149/75  04/15/24 123/79   Wt Readings from Last 3 Encounters:  06/23/24 183 lb (83 kg)  05/24/24 182 lb (82.6 kg)  05/06/24 183 lb (83 kg)      Last CBC Lab Results  Component Value Date   WBC 5.5 01/07/2023   HGB 11.5 (L) 01/07/2023   HCT 34.9 (L) 01/07/2023   MCV 76.8 (L) 01/07/2023   MCH 25.2 (L) 09/17/2022   RDW 15.0 01/07/2023   PLT 380.0 01/07/2023   Last metabolic panel Lab Results  Component Value Date   GLUCOSE 93 04/25/2024   NA 139 04/25/2024   K 5.0 04/25/2024   CL 103 04/25/2024   CO2 29 04/25/2024   BUN 17 04/25/2024   CREATININE 0.87 04/25/2024   GFR 64.00 04/25/2024   CALCIUM 8.8 04/25/2024   PROT 6.7 01/07/2023   ALBUMIN 4.3 01/07/2023   LABGLOB 2.2 07/13/2018   AGRATIO 2.1 07/13/2018   BILITOT 0.5 01/07/2023   ALKPHOS 134 (H) 01/07/2023   AST 17 01/07/2023   ALT 12 01/07/2023   ANIONGAP 8 09/17/2022   Last lipids Lab Results  Component Value Date   CHOL 228 (H) 01/07/2023   HDL 101.40 01/07/2023   LDLCALC 112 (H) 01/07/2023   TRIG 76.0 01/07/2023   CHOLHDL 2 01/07/2023   Last hemoglobin A1c Lab Results  Component Value Date   HGBA1C 5.6 11/27/2023   Last thyroid  functions Lab Results  Component Value Date   TSH 1.35 01/07/2023   Last vitamin D  Lab Results  Component Value Date   VD25OH 42 01/28/2024   Last  vitamin B12 and Folate Lab Results  Component Value Date   VITAMINB12 349 01/28/2024         Assessment & Plan:   Assessment and Plan Assessment & Plan Adult well exam - Up-to-date on routine screenings -Due for flu vaccine today - Will get COVID-vaccine at pharmacy  Obesity, BMI 31 Obesity with significant weight gain. Interest in Zepbound for weight loss despite gastrointestinal issues. Insurance coverage challenges noted. Potential side effects include nausea, vomiting, pancreatitis, and hair loss. Monitoring of liver, kidney, electrolytes, and thyroid  function every three months is essential.  - Attempt to obtain insurance coverage for Zepbound. - Consider self-pay option with [employer] coupon for Zepbound if not covered. - Monitor liver and kidney function, electrolytes, and thyroid  function every three months. - Educated on potential side effects and management strategies, including taking Zofran  for nausea. - Initiate Zepbound at 2.5 mg once weekly, with potential dose escalation based on tolerance and response.  Sacroiliac joint pain Chronic sacroiliac joint pain causing significant discomfort and affecting sleep. Previous referral to Dr. Burnetta. Discussion of potential CT-guided injection at [facility] with preference for both steroid and anesthetic. - Coordinate with [facility] for CT-guided injection with both steroid and anesthetic.  Bladder dysfunction Improvement in bladder dysfunction with fewer accidents. Continues to use protection outside the home. Previously attended physical therapy and now performs exercises at home. - Continue estradiol  0.1 mg/gm vaginally twice weekly. - Continue home exercises for bladder dysfunction.  Chronic constipation Chronic gastrointestinal symptoms with gastroparesis. Current management includes Metamucil and Miralax for bowel regulation. Reports variability in bowel movement timing and challenges with medication timing  due to Metamucil intake. - Continue Metamucil and Miralax as needed for bowel regulation. - Monitor bowel  movement patterns and adjust regimen as needed.  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, stable  - Chronic  Primary hypertension - Chronic, stable, diet controlled  Vitamin D  deficiency - Vitamin D  level today  Atrial fibrillation - Stable, continue Eliquis  - Follow-up with cardiology as scheduled  Mixed hyperlipidemia - Lipids today, stable  Medication management -Labs today, will dose adjust as needed  Immunization due - Flu vaccine given today      Health Maintenance  Topic Date Due   COVID-19 Vaccine (6 - 2025-26 season) 05/23/2024   Medicare Annual Wellness Visit  05/06/2025   DTaP/Tdap/Td vaccine (2 - Td or Tdap) 07/06/2025   Pneumococcal Vaccine for age over 30  Completed   Flu Shot  Completed   DEXA scan (bone density measurement)  Completed   Hepatitis C Screening  Completed   Zoster (Shingles) Vaccine  Completed   Meningitis B Vaccine  Aged Out   Breast Cancer Screening  Discontinued   Colon Cancer Screening  Discontinued     Discussed health benefits of physical activity, and encouraged her to engage in regular exercise appropriate for her age and condition.  Orders Placed This Encounter  Procedures   Flu vaccine HIGH DOSE PF(Fluzone Trivalent)   CBC with Differential/Platelet    Standing Status:   Future    Expiration Date:   06/23/2025    Release to patient:   Immediate [1]   Comprehensive metabolic panel with GFR    Standing Status:   Future    Expiration Date:   06/23/2025    Release to patient:   Immediate [1]   Lipid panel    Standing Status:   Future    Expiration Date:   06/23/2025   Microalbumin / creatinine urine ratio    Standing Status:   Future    Expiration Date:   06/23/2025    Release to patient:   Immediate   TSH    Standing Status:   Future    Expiration Date:    06/23/2025   VITAMIN D  25 Hydroxy (Vit-D Deficiency, Fractures)    Standing Status:   Future    Expiration Date:   06/23/2025   Vitamin B12    Standing Status:   Future    Expiration Date:   06/23/2025     Meds ordered this encounter  Medications   diazepam  (VALIUM ) 10 MG tablet    Sig: TAKE ONE TABLET EVERY TWELVE HOURS AS NEEDED FOR ANXIETY    Dispense:  60 tablet    Refill:  0   zolpidem  (AMBIEN ) 10 MG tablet    Sig: Take 1 tablet (10 mg total) by mouth at bedtime as needed. for sleep    Dispense:  30 tablet    Refill:  1    Return in about 6 months (around 12/22/2024) for meds mgt.  Corean LITTIE Ku, FNP

## 2024-06-23 NOTE — Patient Instructions (Addendum)
 We have completed your physical.   We are checking labs today, will be in contact with any results that require further attention.  We have given your flu vaccine today.

## 2024-06-27 DIAGNOSIS — Z23 Encounter for immunization: Secondary | ICD-10-CM | POA: Insufficient documentation

## 2024-06-27 DIAGNOSIS — E66811 Obesity, class 1: Secondary | ICD-10-CM | POA: Insufficient documentation

## 2024-06-27 DIAGNOSIS — K5909 Other constipation: Secondary | ICD-10-CM | POA: Insufficient documentation

## 2024-06-27 DIAGNOSIS — M533 Sacrococcygeal disorders, not elsewhere classified: Secondary | ICD-10-CM | POA: Insufficient documentation

## 2024-06-27 DIAGNOSIS — N319 Neuromuscular dysfunction of bladder, unspecified: Secondary | ICD-10-CM | POA: Insufficient documentation

## 2024-06-29 ENCOUNTER — Ambulatory Visit: Admitting: Physical Therapy

## 2024-06-29 ENCOUNTER — Other Ambulatory Visit (INDEPENDENT_AMBULATORY_CARE_PROVIDER_SITE_OTHER)

## 2024-06-29 DIAGNOSIS — E782 Mixed hyperlipidemia: Secondary | ICD-10-CM | POA: Diagnosis not present

## 2024-06-29 DIAGNOSIS — I4891 Unspecified atrial fibrillation: Secondary | ICD-10-CM

## 2024-06-29 DIAGNOSIS — Z79899 Other long term (current) drug therapy: Secondary | ICD-10-CM | POA: Diagnosis not present

## 2024-06-29 DIAGNOSIS — I10 Essential (primary) hypertension: Secondary | ICD-10-CM | POA: Diagnosis not present

## 2024-06-29 DIAGNOSIS — K5909 Other constipation: Secondary | ICD-10-CM | POA: Diagnosis not present

## 2024-06-29 DIAGNOSIS — F411 Generalized anxiety disorder: Secondary | ICD-10-CM

## 2024-06-29 DIAGNOSIS — E222 Syndrome of inappropriate secretion of antidiuretic hormone: Secondary | ICD-10-CM

## 2024-06-29 DIAGNOSIS — E559 Vitamin D deficiency, unspecified: Secondary | ICD-10-CM

## 2024-06-29 LAB — LIPID PANEL
Cholesterol: 224 mg/dL — ABNORMAL HIGH (ref 0–200)
HDL: 99.6 mg/dL (ref >39.00)
LDL Cholesterol: 109 mg/dL — ABNORMAL HIGH (ref 0–99)
NonHDL: 124.2
Total CHOL/HDL Ratio: 2
Triglycerides: 76 mg/dL (ref 0.0–149.0)
VLDL: 15.2 mg/dL (ref 0.0–40.0)

## 2024-06-29 LAB — CBC WITH DIFFERENTIAL/PLATELET
Basophils Absolute: 0 K/uL (ref 0.0–0.1)
Basophils Relative: 0.6 % (ref 0.0–3.0)
Eosinophils Absolute: 0.2 K/uL (ref 0.0–0.7)
Eosinophils Relative: 3.7 % (ref 0.0–5.0)
HCT: 33.4 % — ABNORMAL LOW (ref 36.0–46.0)
Hemoglobin: 11 g/dL — ABNORMAL LOW (ref 12.0–15.0)
Lymphocytes Relative: 24.8 % (ref 12.0–46.0)
Lymphs Abs: 1 K/uL (ref 0.7–4.0)
MCHC: 32.9 g/dL (ref 30.0–36.0)
MCV: 81.6 fl (ref 78.0–100.0)
Monocytes Absolute: 0.6 K/uL (ref 0.1–1.0)
Monocytes Relative: 13.5 % — ABNORMAL HIGH (ref 3.0–12.0)
Neutro Abs: 2.4 K/uL (ref 1.4–7.7)
Neutrophils Relative %: 57.4 % (ref 43.0–77.0)
Platelets: 240 K/uL (ref 150.0–400.0)
RBC: 4.09 Mil/uL (ref 3.87–5.11)
RDW: 14.6 % (ref 11.5–15.5)
WBC: 4.1 K/uL (ref 4.0–10.5)

## 2024-06-29 LAB — COMPREHENSIVE METABOLIC PANEL WITH GFR
ALT: 12 U/L (ref 0–35)
AST: 16 U/L (ref 0–37)
Albumin: 4.4 g/dL (ref 3.5–5.2)
Alkaline Phosphatase: 124 U/L — ABNORMAL HIGH (ref 39–117)
BUN: 23 mg/dL (ref 6–23)
CO2: 28 meq/L (ref 19–32)
Calcium: 8.8 mg/dL (ref 8.4–10.5)
Chloride: 96 meq/L (ref 96–112)
Creatinine, Ser: 0.98 mg/dL (ref 0.40–1.20)
GFR: 55.41 mL/min — ABNORMAL LOW (ref >60.00)
Glucose, Bld: 95 mg/dL (ref 70–99)
Potassium: 4.7 meq/L (ref 3.5–5.1)
Sodium: 131 meq/L — ABNORMAL LOW (ref 135–145)
Total Bilirubin: 0.4 mg/dL (ref 0.2–1.2)
Total Protein: 6.9 g/dL (ref 6.0–8.3)

## 2024-06-29 LAB — VITAMIN D 25 HYDROXY (VIT D DEFICIENCY, FRACTURES): VITD: 58.89 ng/mL (ref 30.00–100.00)

## 2024-06-29 LAB — VITAMIN B12: Vitamin B-12: 732 pg/mL (ref 211–911)

## 2024-06-29 LAB — TSH: TSH: 1.68 u[IU]/mL (ref 0.35–5.50)

## 2024-06-29 NOTE — Progress Notes (Signed)
 Remote Loop Recorder Transmission

## 2024-06-30 ENCOUNTER — Ambulatory Visit: Payer: Self-pay | Admitting: Family Medicine

## 2024-06-30 DIAGNOSIS — E6609 Other obesity due to excess calories: Secondary | ICD-10-CM

## 2024-06-30 LAB — MICROALBUMIN / CREATININE URINE RATIO
Creatinine,U: 27.8 mg/dL
Microalb Creat Ratio: UNDETERMINED mg/g (ref 0.0–30.0)
Microalb, Ur: <0.7 mg/dL

## 2024-06-30 MED ORDER — SYRINGE (DISPOSABLE) 3 ML MISC: 1.0000 | 0 refills | Status: DC

## 2024-06-30 MED ORDER — BD BLUNT FILL NEEDLE 18G X 1-1/2" MISC: 1.0000 | 0 refills | Status: DC

## 2024-06-30 MED ORDER — BD DISP NEEDLES 25G X 5/8" MISC
1.0000 | 0 refills | Status: DC
Start: 2024-06-30 — End: 2024-08-02

## 2024-06-30 MED ORDER — TIRZEPATIDE-WEIGHT MANAGEMENT 2.5 MG/0.5ML ~~LOC~~ SOLN: 2.5000 mg | SUBCUTANEOUS | 0 refills | Status: DC

## 2024-07-01 ENCOUNTER — Telehealth: Payer: Self-pay

## 2024-07-01 NOTE — Telephone Encounter (Signed)
 Copied from CRM [id number]. Topic: Clinical - Prescription Issue >> Jul 01, 2024  1:27 PM Ahlexyia S wrote: Reason for CRM: Ronal from [facility] called in to inform clinic that they cannot fill the prescriptions that were sent in yesterday by pt PCP. The prescriptions were Syringe, Disposable, 3 ML MISC, NEEDLE, DISP, 25 G (BD DISP NEEDLES) 25G X 5/8 MISC, NEEDLE, DISP, 18 G (B-D BLUNT FILL NEEDLE) 18G X 1-1/2 MISC and tirzepatide (ZEPBOUND) 2.5 MG/0.5ML injection vial. Ronal stated that the Zepbound injection vial was sent in they cannot order it due to it being a vial. Ronal mentioned that [facility] is only a Tour manager, she also stated that it can be sent in as a script for the prefilled syringes of the zepbound. Louiza agreed to be contacted if clinic needs more clarification on this.   [facility] [phone]

## 2024-07-02 DIAGNOSIS — S52502D Unspecified fracture of the lower end of left radius, subsequent encounter for closed fracture with routine healing: Secondary | ICD-10-CM | POA: Diagnosis not present

## 2024-07-04 ENCOUNTER — Other Ambulatory Visit (HOSPITAL_COMMUNITY): Payer: Self-pay

## 2024-07-04 ENCOUNTER — Ambulatory Visit: Admitting: Podiatry

## 2024-07-04 ENCOUNTER — Telehealth: Payer: Self-pay

## 2024-07-04 ENCOUNTER — Other Ambulatory Visit: Payer: Self-pay

## 2024-07-04 DIAGNOSIS — E6609 Other obesity due to excess calories: Secondary | ICD-10-CM

## 2024-07-04 MED ORDER — ZEPBOUND 2.5 MG/0.5ML ~~LOC~~ SOAJ: 2.5000 mg | SUBCUTANEOUS | 0 refills | Status: DC

## 2024-07-04 NOTE — Addendum Note (Signed)
 Addended by: Skipper Dacosta L on: 07/04/2024 09:27 AM   Modules accepted: Orders

## 2024-07-04 NOTE — Telephone Encounter (Signed)
 New script for pens sent in

## 2024-07-04 NOTE — Telephone Encounter (Signed)
 Pharmacy Patient Advocate Encounter  Insurance verification completed.   The patient is insured through [facility] MEDIC[facility]   Ran test claim for Zepbound 2.5MG /0.5ML pen-injectors.   PLEASE BE ADVISED:  Patient  PLAN HAS EXCLUSION FOR DRUG UNLESS PT HAS SLEEP APNEA,  NON-FORMULARY DRUG,  NO PA WILL BE PROCESS UNLESS THERAPY CHANGE.   This test claim [facility]- copay amounts may vary at other pharmacies due to pharmacy/plan contracts, or as the patient moves through the different stages of their insurance plan.

## 2024-07-07 ENCOUNTER — Ambulatory Visit

## 2024-07-07 DIAGNOSIS — S52502A Unspecified fracture of the lower end of left radius, initial encounter for closed fracture: Secondary | ICD-10-CM | POA: Diagnosis not present

## 2024-07-08 ENCOUNTER — Telehealth: Payer: Self-pay

## 2024-07-08 ENCOUNTER — Ambulatory Visit

## 2024-07-08 DIAGNOSIS — I480 Paroxysmal atrial fibrillation: Secondary | ICD-10-CM

## 2024-07-08 NOTE — Telephone Encounter (Signed)
 Copied from CRM [id number]. Topic: Clinical - Prescription Issue >> Jul 01, 2024  1:27 PM Ahlexyia S wrote: Reason for CRM: Ronal from [facility] called in to inform clinic that they cannot fill the prescriptions that were sent in yesterday by pt PCP. The prescriptions were Syringe, Disposable, 3 ML MISC, NEEDLE, DISP, 25 G (BD DISP NEEDLES) 25G X 5/8 MISC, NEEDLE, DISP, 18 G (B-D BLUNT FILL NEEDLE) 18G X 1-1/2 MISC and tirzepatide (ZEPBOUND) 2.5 MG/0.5ML injection vial. Ronal stated that the Zepbound injection vial was sent in they cannot order it due to it being a vial. Ronal mentioned that [facility] is only a Tour manager, she also stated that it can be sent in as a script for the prefilled syringes of the zepbound. Annissa agreed to be contacted if clinic needs more clarification on this.   [facility] [phone] >> Jul 08, 2024 10:50 AM Mia F wrote: Pt calling to follow up on the coupon for the ZEPBOUND. She also would like to speak with someone about her labs. Please call pt

## 2024-07-10 LAB — CUP PACEART REMOTE DEVICE CHECK
Date Time Interrogation Session: 20251016232302
Implantable Pulse Generator Implant Date: 20230614

## 2024-07-11 ENCOUNTER — Telehealth: Payer: Self-pay

## 2024-07-11 NOTE — Telephone Encounter (Signed)
 Spoke with patient, she wants to pay OOP. Zepbound pens were sent in, she will reach out to her pharmacy

## 2024-07-11 NOTE — Telephone Encounter (Signed)
 Spoke with patient, let her know insurance does not cover it. She mentions she will pay OOP, advised patient to make her pharmacy aware and provide pharmacy with any coupons she may have

## 2024-07-11 NOTE — Telephone Encounter (Signed)
 Copied from CRM [id number]. Topic: Clinical - Prescription Issue >> Jul 11, 2024  1:12 PM Katrina Benitez wrote: Reason for CRM: Patient would like to know the status about the zepound and would  like a call back regarding it today if possible she's be waiting a week

## 2024-07-12 NOTE — Progress Notes (Signed)
 Remote Loop Recorder Transmission

## 2024-07-13 ENCOUNTER — Ambulatory Visit: Payer: Self-pay | Admitting: Internal Medicine

## 2024-07-19 ENCOUNTER — Ambulatory Visit
Admission: RE | Admit: 2024-07-19 | Discharge: 2024-07-19 | Disposition: A | Source: Ambulatory Visit | Attending: Orthopedic Surgery | Admitting: Orthopedic Surgery

## 2024-07-19 ENCOUNTER — Ambulatory Visit
Admission: RE | Admit: 2024-07-19 | Discharge: 2024-07-19 | Disposition: A | Discharge status: Home / Self Care | Source: Ambulatory Visit | Attending: Orthopedic Surgery | Admitting: Orthopedic Surgery

## 2024-07-19 DIAGNOSIS — M259 Joint disorder, unspecified: Secondary | ICD-10-CM

## 2024-07-19 DIAGNOSIS — M545 Low back pain, unspecified: Secondary | ICD-10-CM | POA: Diagnosis not present

## 2024-07-25 ENCOUNTER — Ambulatory Visit: Payer: Medicare Other

## 2024-07-26 ENCOUNTER — Other Ambulatory Visit: Payer: Self-pay | Admitting: Internal Medicine

## 2024-07-26 ENCOUNTER — Telehealth: Payer: Self-pay

## 2024-07-26 NOTE — Telephone Encounter (Signed)
 Patient requesting refill of Zofran  and PPI.  Last seen in office 04/01/22.  Ok to refill?

## 2024-07-26 NOTE — Progress Notes (Deleted)
 I saw Katrina Benitez in neurology clinic on 08/03/24 in follow up for neuropathy.  HPI: Katrina Benitez is a 78 y.o. year old female with a history of lumbar spine disease s/p surgery, postherpetic neuralgia, prediabetes, HTN, afib (on eliquis ), GERD, SIADH, vit D deficiency, depression, anxiety, pelvic floor dysfunction who we last saw on 01/28/24.  To briefly review: 01/28/24: Patient is here for numbness, tingling, and burning in both feet into legs. It is worse at night. She was diagnosed 20 years ago per patient. She was diagnosed by rheumatology and was sent to neurology. She was put on Lyrica, which did not help. Over the years, symptoms have worsened. She previously saw Dr. Jenel for shingles (2018), last in 2018. She has some imbalance and previously has fallen, but none in the last year. She is currently taking gabapentin  600 mg TID. If she does not take this, her pain is much worse.    She is here today because she states there must be something new to treat of neuropathy.   She has bilateral edema in her legs. She has chronic right knee issues for which she has seen someone in the past (ortho?).   She denies cramps or twitching.    The patient does not report symptoms referable to autonomic dysfunction including impaired sweating, heat or cold intolerance, excessive mucosal dryness, gastroparetic early satiety, postprandial abdominal bloating, constipation, bowel or bladder dyscontrol, or syncope/presyncope/orthostatic intolerance. She has pelvic floor dysfunction.   She does not report any constitutional symptoms like fever, night sweats, anorexia or unintentional weight loss.   EtOH use: 1 glass of wine with dinner; most days  Restrictive diet? Watches what she eats and may restrict her diet Family history of neuropathy/myopathy/neurologic disease? None known   She has had EMG in the past which she reports confirmed neuropathy.  Most recent Assessment and Plan (01/28/24): Katrina Benitez is a 78 y.o. female who presents for evaluation of numbness, tingling, and burning in bilateral lower extremities. She has a relevant medical history of lumbar spine disease s/p surgery, postherpetic neuralgia, HTN, afib (on eliquis ), GERD, SIADH, vit D deficiency, pelvic floor dysfunction. Her neurological examination is pertinent for length dependent sensory loss. Available diagnostic data is significant for HbA1c of 5.6. Her B12 was normal in 2017. She had negative celiac testing in 2017. 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 She has had symptoms for about 20 years with no currently known risk factors. She may have contributions from lumbar stenosis as well. I will do labs to look for other treatable causes.   PLAN: -Blood work: B1. B12, IFE, vit D -Discussed EMG but patient agrees to defer -Alpha lipoic acid 600 mg once or twice daily -Change gabapentin  to 600 mg/600 mg/900 mg  Since their last visit: B1 was low. I recommended B1 100 mg daily. B12 was borderline low, so I also recommended B12 1000 mcg daily. ***  ROS: Pertinent positive and negative systems reviewed in HPI. ***   MEDICATIONS:  Outpatient Encounter Medications as of 08/03/2024  Medication Sig   Alum Hydroxide-Mag Carbonate (GAVISCON EXTRA STRENGTH) 160-105 MG CHEW Chew 2 tablets by mouth daily as needed (upset stomach).   betamethasone  valerate (VALISONE ) 0.1 % cream APPLY TWICE DAILY   betamethasone  valerate ointment (VALISONE ) 0.1 % Apply 1 Application topically 2 (two) times daily as needed (itching).   Cholecalciferol 50 MCG (2000 UT) CAPS Take 4,000 Units by mouth daily.   Cyanocobalamin  (VITAMIN B 12  PO) Take by mouth.   diazepam  (VALIUM ) 10 MG tablet TAKE ONE TABLET EVERY TWELVE HOURS AS NEEDED FOR ANXIETY   diphenoxylate -atropine  (LOMOTIL ) 2.5-0.025 MG tablet TAKE ONE (1) TABLET FOUR TIMES EACH DAY AS NEEDED FOR DIARRHEA OR LOOSE STOOLS   ELIQUIS  5 MG TABS tablet TAKE  ONE TABLET BY MOUTH TWICE DAILY   estradiol  (ESTRACE ) 0.1 MG/GM vaginal cream 1 gram vaginally nightly x 7 - 10 days and then twice weekly   furosemide  (LASIX ) 20 MG tablet Take 1 tablet (20 mg total) by mouth daily.   gabapentin  (NEURONTIN ) 600 MG tablet TAKE ONE TABLET BY MOUTH THREE TIMES DAILY   hydrOXYzine  (VISTARIL ) 25 MG capsule Two capsules 4 times daily   lidocaine  (LMX) 4 % cream Use 0.5g peasize up to 3 times a day as needed for pain   methenamine  (HIPREX ) 1 g tablet Take 1 tablet (1 g total) by mouth daily.   mupirocin  ointment (BACTROBAN ) 2 % Apply 1 Application topically 2 (two) times daily.   NEEDLE, DISP, 18 G (B-D BLUNT FILL NEEDLE) 18G X 1-1/2 MISC 1 each by Does not apply route once a week.   NEEDLE, DISP, 25 G (BD DISP NEEDLES) 25G X 5/8 MISC 1 each by Does not apply route once a week.   omeprazole  (PRILOSEC) 40 MG capsule TAKE ONE CAPSULE EACH DAY   ondansetron  (ZOFRAN -ODT) 8 MG disintegrating tablet TAKE ONE TABLET EVERY EIGHT HOURS AS NEEDED FOR NAUSEA AND VOMITING   Polyethyl Glyc-Propyl Glyc PF (SYSTANE HYDRATION PF) 0.4-0.3 % SOLN Place 1 drop into both eyes daily as needed (dry eyes).   Pramoxine HCl (CERAVE ITCH RELIEF) 1 % CREA Apply 1 Application topically daily as needed (itching).   Probiotic Product (ALIGN PO) Take 1 capsule by mouth daily.   sodium chloride  1 g tablet Take 1 g by mouth 2 (two) times daily with a meal.   Syringe, Disposable, 3 ML MISC 1 each by Does not apply route once a week.   Thiamine  HCl (VITAMIN B1 PO) Take by mouth.   tirzepatide (ZEPBOUND) 2.5 MG/0.5ML Pen Inject 2.5 mg into the skin once a week.   triamcinolone  cream (KENALOG ) 0.1 % Apply 1 Application topically as needed (itching).   zolpidem  (AMBIEN ) 10 MG tablet Take 1 tablet (10 mg total) by mouth at bedtime as needed. for sleep   No facility-administered encounter medications on file as of 08/03/2024.    PAST MEDICAL HISTORY: Past Medical History:  Diagnosis Date   Allergy     Anemia    Anxiety    Arthritis    degenerative disc disorder   Cancer (HCC)    skin cancer   Cardiac arrhythmia    Cataract    surgery in right eye in 03/2022   Diverticulitis    Diverticulosis    GERD (gastroesophageal reflux disease)    Heart murmur    HTN (hypertension)    not on meds in 2 years per pt on 05/19/22 n   Infertility, female    Neuromuscular disorder (HCC)    idiopathic peripheral  neuropathy   Peripheral neuropathy    Positive PPD    SIADH (syndrome of inappropriate ADH production)     PAST SURGICAL HISTORY: Past Surgical History:  Procedure Laterality Date   BREAST BIOPSY Left    BREAST EXCISIONAL BIOPSY Left    benign   CATARACT EXTRACTION Right 2023   CERVICAL BIOPSY  W/ LOOP ELECTRODE EXCISION     CERVICAL POLYPECTOMY  02/2016  CHOLECYSTECTOMY N/A 06/02/2022   Procedure: LAPAROSCOPIC CHOLECYSTECTOMY WITH INTRAOPERATIVE CHOLANGIOGRAM;  Surgeon: Eletha Boas, MD;  Location: [facility];  Service: General;  Laterality: N/A;   COLONOSCOPY     COLPOSCOPY     DILATION AND CURETTAGE OF UTERUS     HYSTEROSCOPY     LOOP RECORDER IMPLANT     LUMBAR DISC SURGERY     LUMBAR LAMINECTOMY     TONSILLECTOMY     UPPER GASTROINTESTINAL ENDOSCOPY      ALLERGIES: Allergies  Allergen Reactions   Flagyl [Metronidazole] Hives   Hydromorphone Hives   Iodixanol Other (See Comments) and Swelling    Throat swelling  Other Reaction(s): Other (See Comments)  Throat swelling, Throat swelling, Throat swelling, Other reaction(s): Other, Other (See Comments), Throat swelling, Throat swelling, Throat swelling, Throat swelling, Throat swelling   Fentanyl  Itching   Cipro [Ciprofloxacin Hcl] Hives   Dilaudid [Hydromorphone Hcl] Hives   Ivp Dye [Iodinated Contrast Media]     FAMILY HISTORY: Family History  Problem Relation Age of Onset   Breast cancer Mother    Glaucoma Mother    Miscarriages / Stillbirths Mother    Heart attack Father    Depression Father    Early  death Father    Heart disease Father    Depression Brother    Hypertension Brother    Tuberculosis Paternal Grandmother    Prostate cancer Paternal Grandfather    Colon cancer Neg Hx    Esophageal cancer Neg Hx    Rectal cancer Neg Hx    Stomach cancer Neg Hx    Uterine cancer Neg Hx    Bladder Cancer Neg Hx     SOCIAL HISTORY: Social History   Tobacco Use   Smoking status: Never   Smokeless tobacco: Never  Vaping Use   Vaping status: Never Used  Substance Use Topics   Alcohol use: Yes    Alcohol/week: 7.0 standard drinks of alcohol    Types: 7 Glasses of wine per week    Comment: 102 glsses wine daily- 1 -1.1/2 a day 01/28/24   Drug use: No   Social History   Social History Narrative   Lives at home with her husband. Two story   Right-handed.   1-2 cups caffeine  per day.   Retired marine scientist    Objective:  Vital Signs:  LMP  (LMP Unknown)   General:*** General appearance: Awake and alert. No distress. Cooperative with exam.  Skin: No obvious rash or jaundice. HEENT: Atraumatic. Anicteric. Lungs: Non-labored breathing on room air  Heart: Regular Abdomen: Soft, non tender. Extremities: No edema. No obvious deformity.  Musculoskeletal: No obvious joint swelling.  Neurological: Mental Status: Alert. Speech fluent. No pseudobulbar affect Cranial Nerves: CNII: No RAPD. Visual fields intact. CNIII, IV, VI: PERRL. No nystagmus. EOMI. CN V: Facial sensation intact bilaterally to fine touch. [place] clench strong. Jaw jerk***. CN VII: Facial muscles symmetric and strong. No ptosis at rest or after sustained upgaze***. CN VIII: Hears finger rub well bilaterally. CN IX: No hypophonia. CN X: Palate elevates symmetrically. CN XI: Full strength shoulder shrug bilaterally. CN XII: Tongue protrusion full and midline. No atrophy or fasciculations. No significant dysarthria*** Motor: Tone is ***. *** fasciculations in *** extremities. *** atrophy. No grip or  percussive myotonia.  Individual muscle group testing (MRC grade out of 5):  Movement     Neck flexion ***    Neck extension ***     Right Left   Shoulder abduction *** ***  Shoulder adduction *** ***   Shoulder ext rotation *** ***   Shoulder int rotation *** ***   Elbow flexion *** ***   Elbow extension *** ***   Wrist extension *** ***   Wrist flexion *** ***   Finger abduction - FDI *** ***   Finger abduction - ADM *** ***   Finger extension *** ***   Finger distal flexion - 2/3 *** ***   Finger distal flexion - 4/5 *** ***   Thumb flexion - FPL *** ***   Thumb abduction - APB *** ***    Hip flexion *** ***   Hip extension *** ***   Hip adduction *** ***   Hip abduction *** ***   Knee extension *** ***   Knee flexion *** ***   Dorsiflexion *** ***   Plantarflexion *** ***   Inversion *** ***   Eversion *** ***   Great toe extension *** ***   Great toe flexion *** ***     Reflexes:  Right Left  Bicep *** ***  Tricep *** ***  BrRad *** ***  Knee *** ***  Ankle *** ***   Pathological Reflexes: Babinski: *** response bilaterally*** Hoffman: *** Troemner: *** Pectoral: *** Palmomental: *** Facial: *** Midline tap: *** Sensation: Pinprick: *** Vibration: *** Temperature: *** Proprioception: *** Coordination: Intact finger-to- nose-finger and heel-to-shin bilaterally. Romberg negative.*** Gait: Able to rise from chair with arms crossed unassisted. Normal, narrow-based gait. Able to tandem walk. Able to walk on toes and heels.***   Lab and Test Review: New results: 06/29/24: TSH wnl Lipid panel: tChol 224, LDL 109, TG 76.0 B12: 732  01/28/24: B12: 349 B1: low at 7 IFE: no M protein Vit D wnl  Previously reviewed results: HbA1c (11/27/23): 5.6 Vit D (06/02/23) wnl Lipid panel (01/07/23): tChol 228, LDL 112, TG 76 TSH (01/07/23) wnl ANA (09/12/20) negative B12 (12/12/15): 402 ACE (12/12/15) wnl Lyme panel (12/12/15) negative Tissue transglutaminase  ab, IgA (10/03/15) negative   Imaging/Procedures: MRI cervical spine wo contrast (07/09/2017): IMPRESSION:  This MRI of the cervical spine without contrast shows the following: 1.    The spinal cord appears normal and there is no significant spinal stenosis. 2.    There are multilevel degenerative changes as detailed above. At C3-C4 there is moderately severe foraminal narrowing to the right that could lead to compression of the right C4 nerve root. There is less potential for nerve root compression at the other levels.   MRI lumbar spine w/wo contrast (11/03/2016): IMPRESSION: 1. Moderate foraminal stenosis at left L1-L2 and right L5-S1. 2. No spinal canal stenosis.   MRI brain w/wo contrast (12/21/15): IMPRESSION:  This MRI of the brain with and without contrast shows the following: 1.   Mild generalized cortical atrophy. 2.   Small number of T2/FLAIR hyperintense foci in the pons and hemispheres consistent with minimal chronic microvascular ischemic age, typical for age.  3.   There is a normal enhancement pattern. There are no acute findings.   Carotid ultrasound (06/06/15): IMPRESSION: Color duplex indicates minimal heterogeneous plaque, with no hemodynamically significant stenosis by duplex criteria in the extracranial cerebrovascular circulation.  ASSESSMENT: This is Katrina Benitez, a 78 y.o. female with:  ***  Plan: ***  Return to clinic in ***  Total time spent reviewing records, interview, history/exam, documentation, and coordination of care on day of encounter:  *** min  Venetia Potters, MD

## 2024-07-27 NOTE — Telephone Encounter (Signed)
 Medications refilled

## 2024-07-28 ENCOUNTER — Ambulatory Visit: Admitting: Internal Medicine

## 2024-08-01 NOTE — Progress Notes (Unsigned)
 I saw Katrina Benitez in neurology clinic on 08/03/24 in follow up for neuropathy.  HPI: Katrina Benitez is a 78 y.o. year old female with a history of lumbar spine disease s/p surgery, postherpetic neuralgia, prediabetes, HTN, afib (on eliquis ), GERD, SIADH, vit D deficiency, depression, anxiety, pelvic floor dysfunction who we last saw on 01/28/24.  To briefly review: 01/28/24: Patient is here for numbness, tingling, and burning in both feet into legs. It is worse at night. She was diagnosed 20 years ago per patient. She was diagnosed by rheumatology and was sent to neurology. She was put on Lyrica, which did not help. Over the years, symptoms have worsened. She previously saw Dr. Jenel for shingles (2018), last in 2018. She has some imbalance and previously has fallen, but none in the last year. She is currently taking gabapentin  600 mg TID. If she does not take this, her pain is much worse.    She is here today because she states there must be something new to treat of neuropathy.   She has bilateral edema in her legs. She has chronic right knee issues for which she has seen someone in the past (ortho?).   She denies cramps or twitching.    The patient does not report symptoms referable to autonomic dysfunction including impaired sweating, heat or cold intolerance, excessive mucosal dryness, gastroparetic early satiety, postprandial abdominal bloating, constipation, bowel or bladder dyscontrol, or syncope/presyncope/orthostatic intolerance. She has pelvic floor dysfunction.   She does not report any constitutional symptoms like fever, night sweats, anorexia or unintentional weight loss.   EtOH use: 1 glass of wine with dinner; most days  Restrictive diet? Watches what she eats and may restrict her diet Family history of neuropathy/myopathy/neurologic disease? None known   She has had EMG in the past which she reports confirmed neuropathy.  Most recent Assessment and Plan (01/28/24): Katrina Benitez is a 78 y.o. female who presents for evaluation of numbness, tingling, and burning in bilateral lower extremities. She has a relevant medical history of lumbar spine disease s/p surgery, postherpetic neuralgia, HTN, afib (on eliquis ), GERD, SIADH, vit D deficiency, pelvic floor dysfunction. Her neurological examination is pertinent for length dependent sensory loss. Available diagnostic data is significant for HbA1c of 5.6. Her B12 was normal in 2017. She had negative celiac testing in 2017. 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 She has had symptoms for about 20 years with no currently known risk factors. She may have contributions from lumbar stenosis as well. I will do labs to look for other treatable causes.   PLAN: -Blood work: B1. B12, IFE, vit D -Discussed EMG but patient agrees to defer -Alpha lipoic acid 600 mg once or twice daily -Change gabapentin  to 600 mg/600 mg/900 mg  Since their last visit: B1 was low. I recommended B1 100 mg daily. B12 was borderline low, so I also recommended B12 1000 mcg daily. Patient is taking this.  The burning in her legs is much better than prior. She is still taking gabapentin  600/600/900 and feels that is helping.  She broke her right wrist a couple of months ago after falling while trying to take her stockings off for lymphedema.   MEDICATIONS:  Outpatient Encounter Medications as of 08/05/2024  Medication Sig   Alum Hydroxide-Mag Carbonate (GAVISCON EXTRA STRENGTH) 160-105 MG CHEW Chew 2 tablets by mouth daily as needed (upset stomach).   betamethasone  valerate (VALISONE ) 0.1 % cream APPLY TWICE DAILY (Patient taking differently:  Apply topically as needed.)   Cholecalciferol 50 MCG (2000 UT) CAPS Take 4,000 Units by mouth daily.   Cyanocobalamin  (VITAMIN B 12 PO) Take by mouth.   diazepam  (VALIUM ) 10 MG tablet TAKE ONE TABLET EVERY TWELVE HOURS AS NEEDED FOR ANXIETY (Patient taking differently: every 6  (six) hours as needed. TAKE ONE TABLET EVERY TWELVE HOURS AS NEEDED FOR ANXIETY)   diphenoxylate -atropine  (LOMOTIL ) 2.5-0.025 MG tablet TAKE ONE (1) TABLET FOUR TIMES EACH DAY AS NEEDED FOR DIARRHEA OR LOOSE STOOLS   ELIQUIS  5 MG TABS tablet TAKE ONE TABLET BY MOUTH TWICE DAILY   estradiol  (ESTRACE ) 0.01 % CREA vaginal cream Place 0.5-1g at night 2-3 times a week   furosemide  (LASIX ) 20 MG tablet Take 1 tablet (20 mg total) by mouth daily. (Patient taking differently: Take 20 mg by mouth as needed.)   gabapentin  (NEURONTIN ) 600 MG tablet TAKE ONE TABLET BY MOUTH THREE TIMES DAILY   hydrOXYzine  (VISTARIL ) 25 MG capsule Two capsules 4 times daily (Patient taking differently: 25 mg 4 (four) times daily. Two capsules 4 times daily)   lidocaine  (LMX) 4 % cream Use 0.5g peasize up to 3 times a day as needed for pain (Patient not taking: Reported on 08/05/2024)   methenamine  (HIPREX ) 1 g tablet Take 1 tablet (1 g total) by mouth daily.   mupirocin  ointment (BACTROBAN ) 2 % Apply 1 Application topically 2 (two) times daily.   omeprazole  (PRILOSEC) 40 MG capsule TAKE ONE CAPSULE EACH DAY   ondansetron  (ZOFRAN -ODT) 8 MG disintegrating tablet TAKE ONE TABLET EVERY EIGHT HOURS AS NEEDED FOR NAUSEA AND VOMITING   Polyethyl Glyc-Propyl Glyc PF (SYSTANE HYDRATION PF) 0.4-0.3 % SOLN Place 1 drop into both eyes daily as needed (dry eyes).   Pramoxine HCl (CERAVE ITCH RELIEF) 1 % CREA Apply 1 Application topically daily as needed (itching).   Probiotic Product (ALIGN PO) Take 1 capsule by mouth daily.   sodium chloride  1 g tablet Take 1 g by mouth 2 (two) times daily with a meal.   Thiamine  HCl (VITAMIN B1 PO) Take by mouth.   tirzepatide (ZEPBOUND) 2.5 MG/0.5ML Pen Inject 2.5 mg into the skin once a week.   triamcinolone  cream (KENALOG ) 0.1 % Apply 1 Application topically as needed (itching).   zolpidem  (AMBIEN ) 10 MG tablet Take 1 tablet (10 mg total) by mouth at bedtime as needed. for sleep   [DISCONTINUED]  betamethasone  valerate ointment (VALISONE ) 0.1 % Apply 1 Application topically 2 (two) times daily as needed (itching).   [DISCONTINUED] diazepam  (VALIUM ) 10 MG tablet TAKE ONE TABLET EVERY TWELVE HOURS AS NEEDED FOR ANXIETY   [DISCONTINUED] estradiol  (ESTRACE ) 0.1 MG/GM vaginal cream 1 gram vaginally nightly x 7 - 10 days and then twice weekly   [DISCONTINUED] methenamine  (HIPREX ) 1 g tablet Take 1 tablet (1 g total) by mouth daily.   [DISCONTINUED] NEEDLE, DISP, 18 G (B-D BLUNT FILL NEEDLE) 18G X 1-1/2 MISC 1 each by Does not apply route once a week.   [DISCONTINUED] NEEDLE, DISP, 25 G (BD DISP NEEDLES) 25G X 5/8 MISC 1 each by Does not apply route once a week.   [DISCONTINUED] Syringe, Disposable, 3 ML MISC 1 each by Does not apply route once a week.   [DISCONTINUED] tirzepatide (ZEPBOUND) 2.5 MG/0.5ML Pen Inject 2.5 mg into the skin once a week.   No facility-administered encounter medications on file as of 08/05/2024.    PAST MEDICAL HISTORY: Past Medical History:  Diagnosis Date   Allergy    Anemia  Anxiety    Arthritis    degenerative disc disorder   Cancer (HCC)    skin cancer   Cardiac arrhythmia    Cataract    surgery in right eye in 03/2022   Diverticulitis    Diverticulosis    GERD (gastroesophageal reflux disease)    Heart murmur    HTN (hypertension)    not on meds in 2 years per pt on 05/19/22 n   Infertility, female    Neuromuscular disorder (HCC)    idiopathic peripheral  neuropathy   Peripheral neuropathy    Positive PPD    SIADH (syndrome of inappropriate ADH production)     PAST SURGICAL HISTORY: Past Surgical History:  Procedure Laterality Date   BREAST BIOPSY Left    BREAST EXCISIONAL BIOPSY Left    benign   CATARACT EXTRACTION Right 2023   CERVICAL BIOPSY  W/ LOOP ELECTRODE EXCISION     CERVICAL POLYPECTOMY  02/2016   CHOLECYSTECTOMY N/A 06/02/2022   Procedure: LAPAROSCOPIC CHOLECYSTECTOMY WITH INTRAOPERATIVE CHOLANGIOGRAM;  Surgeon: Eletha Boas, MD;  Location: [facility];  Service: General;  Laterality: N/A;   COLONOSCOPY     COLPOSCOPY     DILATION AND CURETTAGE OF UTERUS     HYSTEROSCOPY     LOOP RECORDER IMPLANT     LUMBAR DISC SURGERY     LUMBAR LAMINECTOMY     TONSILLECTOMY     UPPER GASTROINTESTINAL ENDOSCOPY      ALLERGIES: Allergies  Allergen Reactions   Flagyl [Metronidazole] Hives   Hydromorphone Hives   Iodixanol Other (See Comments) and Swelling    Throat swelling  Other Reaction(s): Other (See Comments)  Throat swelling, Throat swelling, Throat swelling, Other reaction(s): Other, Other (See Comments), Throat swelling, Throat swelling, Throat swelling, Throat swelling, Throat swelling   Fentanyl  Itching   Cipro [Ciprofloxacin Hcl] Hives   Dilaudid [Hydromorphone Hcl] Hives   Ivp Dye [Iodinated Contrast Media]     FAMILY HISTORY: Family History  Problem Relation Age of Onset   Breast cancer Mother    Glaucoma Mother    Miscarriages / Stillbirths Mother    Heart attack Father    Depression Father    Early death Father    Heart disease Father    Depression Brother    Hypertension Brother    Tuberculosis Paternal Grandmother    Prostate cancer Paternal Grandfather    Colon cancer Neg Hx    Esophageal cancer Neg Hx    Rectal cancer Neg Hx    Stomach cancer Neg Hx    Uterine cancer Neg Hx    Bladder Cancer Neg Hx    Renal cancer Neg Hx     SOCIAL HISTORY: Social History   Tobacco Use   Smoking status: Never   Smokeless tobacco: Never  Vaping Use   Vaping status: Never Used  Substance Use Topics   Alcohol use: Yes    Alcohol/week: 7.0 standard drinks of alcohol    Types: 7 Glasses of wine per week    Comment: 102 glsses wine daily- 1 -1.1/2 a day 01/28/24   Drug use: No   Social History   Social History Narrative   Lives at home with her husband. Two story   Right-handed.   1-2 cups caffeine  per day.   Retired radiologist    Objective:  Vital Signs:  BP 117/72   Pulse 82    Ht 5' 4 (1.626 m)   LMP  (LMP Unknown)   SpO2 99%  BMI 31.41 kg/m   General: General appearance: Awake and alert. No distress. Cooperative with exam.  Skin: No obvious rash or jaundice. HEENT: Atraumatic. Anicteric. Lungs: Non-labored breathing on room air  Extremities: Bilateral lower extremity edema  Neurological: Mental Status: Alert. Speech fluent. No pseudobulbar affect Cranial Nerves: CNII: No RAPD. Visual fields intact. CNIII, IV, VI: PERRL. No nystagmus. EOMI. CN V: Facial sensation intact bilaterally to fine touch. CN VII: Facial muscles symmetric and strong. No ptosis at rest. CN VIII: Hears finger rub well bilaterally. CN IX: No hypophonia. CN X: Palate elevates symmetrically. CN XI: Full strength shoulder shrug bilaterally. CN XII: Tongue protrusion full and midline. No atrophy or fasciculations. No significant dysarthria Motor: Tone is normal. Strength is 5/5 in bilateral upper and lower extremities. Distal right upper limb not tested due to brace on it due to recent fracture. Reflexes:  Right Left  Bicep 2+ 2+  Tricep 2+ 2+  BrRad 2+ 2+  Knee 2+ 2+  Ankle 0 0   Sensation: Pinprick: Diminished in bilateral lower extremities to just below the knees medially and slightly more distally on lateral aspect. Intact in upper extremities. Vibration: Absent in bilateral great toes and ankles. Intact in upper extremities and bilateral patella. Gait: Able to rise from chair with arms crossed unassisted. Wide based gait.  Lab and Test Review: New results: 06/29/24: TSH wnl Lipid panel: tChol 224, LDL 109, TG 76.0 B12: 732  01/28/24: B12: 349 B1: low at 7 IFE: no M protein Vit D wnl  Previously reviewed results: HbA1c (11/27/23): 5.6 Vit D (06/02/23) wnl Lipid panel (01/07/23): tChol 228, LDL 112, TG 76 TSH (01/07/23) wnl ANA (09/12/20) negative B12 (12/12/15): 402 ACE (12/12/15) wnl Lyme panel (12/12/15) negative Tissue transglutaminase ab, IgA (10/03/15)  negative   Imaging/Procedures: MRI cervical spine wo contrast (07/09/2017): IMPRESSION:  This MRI of the cervical spine without contrast shows the following: 1.    The spinal cord appears normal and there is no significant spinal stenosis. 2.    There are multilevel degenerative changes as detailed above. At C3-C4 there is moderately severe foraminal narrowing to the right that could lead to compression of the right C4 nerve root. There is less potential for nerve root compression at the other levels.   MRI lumbar spine w/wo contrast (11/03/2016): IMPRESSION: 1. Moderate foraminal stenosis at left L1-L2 and right L5-S1. 2. No spinal canal stenosis.   MRI brain w/wo contrast (12/21/15): IMPRESSION:  This MRI of the brain with and without contrast shows the following: 1.   Mild generalized cortical atrophy. 2.   Small number of T2/FLAIR hyperintense foci in the pons and hemispheres consistent with minimal chronic microvascular ischemic age, typical for age.  3.   There is a normal enhancement pattern. There are no acute findings.   Carotid ultrasound (06/06/15): IMPRESSION: Color duplex indicates minimal heterogeneous plaque, with no hemodynamically significant stenosis by duplex criteria in the extracranial cerebrovascular circulation.  ASSESSMENT: This is Katrina Benitez, a 78 y.o. female with: Numbness, tingling, burning in lower limbs - consistent with a distal symmetric polyneuropathy. Her risk factors include very mild pre-DM, B1 deficiency, borderline low B12. She may also have contributions from lumbar spondylosis as well. Her symptoms have improved since starting B1, B12, and increasing nightly gabapentin . B1 deficiency Borderline low B12  Plan: -Continue gabapentin  600 mg/600 mg/900 mg -Continue B1 100 mg daily -Continue B12 1000 mcg daily -Continue alpha lipoic acid 600 mg twice daily -Fall precautions discussed   Return  to clinic in 1 year  Total time spent reviewing  records, interview, history/exam, documentation, and coordination of care on day of encounter:  35 min  Venetia Potters, MD

## 2024-08-02 ENCOUNTER — Encounter: Payer: Self-pay | Admitting: Obstetrics

## 2024-08-02 ENCOUNTER — Ambulatory Visit (INDEPENDENT_AMBULATORY_CARE_PROVIDER_SITE_OTHER): Admitting: Obstetrics

## 2024-08-02 VITALS — BP 97/61 | HR 65

## 2024-08-02 DIAGNOSIS — R3989 Other symptoms and signs involving the genitourinary system: Secondary | ICD-10-CM

## 2024-08-02 DIAGNOSIS — K5909 Other constipation: Secondary | ICD-10-CM

## 2024-08-02 DIAGNOSIS — N952 Postmenopausal atrophic vaginitis: Secondary | ICD-10-CM | POA: Diagnosis not present

## 2024-08-02 DIAGNOSIS — Z87440 Personal history of urinary (tract) infections: Secondary | ICD-10-CM

## 2024-08-02 MED ORDER — METHENAMINE HIPPURATE 1 G PO TABS: 1.0000 g | ORAL_TABLET | Freq: Every day | ORAL | 2 refills | Status: DC

## 2024-08-02 MED ORDER — ESTRADIOL 0.01 % VA CREA
TOPICAL_CREAM | VAGINAL | 3 refills | Status: AC
Start: 2024-08-04 — End: ?

## 2024-08-02 NOTE — Patient Instructions (Addendum)
 Continue vaginal estrogen 1g twice a week  Please call if you experience any change in urinary or vaginal symptoms.   Continue metamucil 2 tablespoon/day due to Zepbound use and history of constipation.   Resume methenamine  if you experience return of urinary symptoms.

## 2024-08-02 NOTE — Assessment & Plan Note (Addendum)
 For constipation, we reviewed the importance of a better bowel regimen.  We also discussed the importance of avoiding chronic straining, as it can exacerbate her pelvic floor symptoms; we discussed treating constipation and straining prior to surgery, as postoperative straining can lead to damage to the repair and recurrence of symptoms. We discussed initiating therapy with increasing fluid intake, fiber supplementation, stool softeners, and laxatives such as miralax.

## 2024-08-02 NOTE — Progress Notes (Signed)
 [facility] Return Visit  SUBJECTIVE  History of Present Illness: Katrina Benitez is a 78 y.o. female seen in follow-up for vulvodynia, vaginal atrophy, history of recurrent UTI, sensation of incomplete bladder emptying, constipation, and lower urinary tract symptoms. Plan at last visit was referral to pelvic floor PT, pelvic binder use, resume methenamine , continue vaginal estrogen, hydroxyzine  QID, and titration of fiber supplementation.   Broke her wrist with a brace in place, happened 2 months ago and got out of her cast 2 weeks ago. Reports burning with bladder fullness a few weeks ago, spontaneously resolved without treatment.  Denies UTI symptoms or recent treatments Started Zepbound, reports monitoring diet and eats mostly fruits and cottage cheese. Stopped miralax since she dislikes mushy stool. Increased 2 tbsp metamucil/day Working on balance Discontinued pelvic floor PT  Continue vaginal estrogen 2x/week Continues squatty potty use.   Prior visits: Desires to proceed with SI joint injections for back pain. Discontinued Hipprex since Rx ran out. Negative cystoscopy 03/17/24 Negative UA on 12/10/23 and 04/15/24. Culture with insufficient growth and negative pathnostics.  Office evaluation 12/21/23 for persistent dysuria, hematuria and urinary frequency. UA UA negative, culture with 10K E. Coli resistant to bactrim , gentamicin, ampicillin and intermediate to Augmentin .  Rx macrobid  and pyridium  on 12/22/23 changed to Keflex  500mg  QID on 12/23/23 due to persistent symptoms and Rx to start methenamine  for UTI suppression.  Prior return of dysuria and some external itching on daily hipprex Resolution of urinary urgency and improved bladder control.  Keflex  with most relief towards the end of the treatment course Bowel movements, started using squatty potty with Bristol III stool  Continues vaginal estrogen 1g twice a week Continues hydroxyzine  50mg  QID No relief with  topical lidocaine  Reports worsening symptoms of peripheral neuropathy pending follow-up with Dr. Leigh (neurology)  Past Medical History: Patient  has a past medical history of Allergy, Anemia, Anxiety, Arthritis, Cancer (HCC), Cardiac arrhythmia, Cataract, Diverticulitis, Diverticulosis, GERD (gastroesophageal reflux disease), Heart murmur, HTN (hypertension), Infertility, female, Neuromuscular disorder (HCC), Peripheral neuropathy, Positive PPD, and SIADH (syndrome of inappropriate ADH production).   Past Surgical History: She  has a past surgical history that includes Lumbar disc surgery; Lumbar laminectomy; Tonsillectomy; Colonoscopy; Upper gastrointestinal endoscopy; Cervical polypectomy (02/2016); Breast biopsy (Left); Breast excisional biopsy (Left); Colposcopy; Dilation and curettage of uterus; Hysteroscopy; Cervical biopsy w/ loop electrode excision; Loop recorder implant; Cholecystectomy (N/A, 06/02/2022); and Cataract extraction (Right, 2023).   Medications: She has a current medication list which includes the following prescription(s): gaviscon extra strength, betamethasone  valerate, cholecalciferol, cyanocobalamin , diazepam , diphenoxylate -atropine , eliquis , [START ON 08/04/2024] estradiol , furosemide , gabapentin , hydroxyzine , lidocaine , mupirocin  ointment, omeprazole , ondansetron , systane hydration pf, cerave itch relief, probiotic product, sodium chloride , thiamine  hcl, zepbound, triamcinolone  cream, zolpidem , and methenamine .   Allergies: Patient is allergic to flagyl [metronidazole], hydromorphone, iodixanol, fentanyl , cipro [ciprofloxacin hcl], dilaudid [hydromorphone hcl], and ivp dye [iodinated contrast media].   Social History: Patient  reports that she has never smoked. She has never used smokeless tobacco. She reports current alcohol use of about 7.0 standard drinks of alcohol per week. She reports that she does not use drugs.     OBJECTIVE    Today's Vitals   08/02/24 1630   BP: 97/61  Pulse: 65   There is no height or weight on file to calculate BMI.  Physical Exam: Gen: No apparent distress, A&O x 3.  Lab Results  Component Value Date   CREATININE 0.98 06/29/2024      ASSESSMENT AND PLAN  Katrina Benitez is a 78 y.o. with:  1. Bladder pain   2. History of recurrent UTI (urinary tract infection)   3. Other constipation   4. Vaginal atrophy       Bladder pain Assessment & Plan: - previously catheterized for , after void - repeat at follow-up at the time of cystoscopy with PVR 30mL - possibly due to myofascial pain and constipation - continue fiber supplementation for optimization of stool consistency  - s/p pelvic floor PT  - discussed history of peripheral neuropathy, reviewed need for urodynamics and renal imaging if abnormal bladder emptying.  - Cr 1.09 on 11/27/23, repeat 0.98 in 06/29/24 - consider bladder instillation for flare of symptoms   History of recurrent UTI (urinary tract infection) Assessment & Plan: - prior improvement of UTI symptoms after Keflex  QID dosing and some return of dysuria for 1 week after missing vaginal estrogen dosing  - continue vaginal estrogen, can increase to 1g 3x/week - Rx methenamine  per request, advised pt to avoid restarting unless if she experiences return of lower urinary tract symptoms - symptoms previously improved after pelvic floor PT - prior POCT clean catch UA + trace leuk/+ heme, catheterized urine testing negative. Negative UA, culture and pathnostics 04/15/24 - denies kidney stones or hospitalization for pyelonephritis  - For treatment of recurrent urinary tract infections, we discussed management of recurrent UTIs including prophylaxis with a daily low dose antibiotic, transvaginal estrogen therapy, D-mannose, and cranberry supplements.   - negative cystoscopy 03/17/24   - continue low dose vaginal estrogen and probiotics - discussed overlapping symptomatology with bladder pain. For  irritative bladder we reviewed treatment options including altering her diet to avoid irritative beverages and foods as well as attempting to decrease stress and other exacerbating factors.  We also discussed using pyridium  and similar over-the-counter medications for pain relief as needed. We discussed the pentad of medications including Tums, an antihistamine such as Vistaril , amitriptyline , and L-arginine.  We also discussed in-office bladder instillations for pain flares, as well as cystoscopy with hydrodistention in the operating room, which can be both diagnostic and therapeutic. She was also given information on the [employer] at https://www.ic-network.com for bladder diet suggestions and patient forums for support. - continue hydroxyzine  QID for idiopathic generalized itching, discussed risks of anticholinergic medications - consider bladder instillation  Orders: -     Methenamine  Hippurate; Take 1 tablet (1 g total) by mouth daily.  Dispense: 30 tablet; Refill: 2  Other constipation Assessment & Plan: - symptoms started since vistaril  use, prior bristol I, III, IV on colace improved with daily Bristol III stool previously on 1 capful of miralax - intermittent loose stool improved after discontinuation of miralax  - continue 2 Tbsp metamucil and titrate as needed due to recent start of Zepbound - continue to avoid chronic straining, as it can exacerbate her pelvic floor symptoms  - continue squatty potty use and pelvic floor relaxation during bowel movements - discussed association with sensation of incomplete bladder emptying - s/p pelvic floor PT   Vaginal atrophy Assessment & Plan: - For symptomatic vaginal atrophy options include lubrication with a water-based lubricant, personal hygiene measures and barrier protection against wetness, and estrogen replacement in the form of vaginal cream, vaginal tablets, or a time-released vaginal ring.   - continue vaginal estrogen 1g 2-3 times a  week   Other orders -     Estradiol ; Place 0.5-1g at night 2-3 times a week  Dispense: 42.5 g; Refill: 3  Time spent: I spent 26 minutes dedicated to the care of this patient on the date of this encounter to include pre-visit review of records, face-to-face time with the patient discussing history of recurrent UTI, vaginal atrophy, constipation, feeling of incomplete bladder emptying, vulvodynia, and post visit documentation and ordering medication/ testing.   Lianne ONEIDA Gillis, MD

## 2024-08-02 NOTE — Assessment & Plan Note (Addendum)
-   symptoms started since vistaril  use, prior bristol I, III, IV on colace improved with daily Bristol III stool previously on 1 capful of miralax - intermittent loose stool improved after discontinuation of miralax  - continue 2 Tbsp metamucil and titrate as needed due to recent start of Zepbound - continue to avoid chronic straining, as it can exacerbate her pelvic floor symptoms  - continue squatty potty use and pelvic floor relaxation during bowel movements - discussed association with sensation of incomplete bladder emptying - s/p pelvic floor PT

## 2024-08-02 NOTE — Assessment & Plan Note (Signed)
-   prior improvement of UTI symptoms after Keflex  QID dosing and some return of dysuria for 1 week after missing vaginal estrogen dosing  - continue vaginal estrogen, can increase to 1g 3x/week - Rx methenamine  per request, advised pt to avoid restarting unless if she experiences return of lower urinary tract symptoms - symptoms previously improved after pelvic floor PT - prior POCT clean catch UA + trace leuk/+ heme, catheterized urine testing negative. Negative UA, culture and pathnostics 04/15/24 - denies kidney stones or hospitalization for pyelonephritis  - For treatment of recurrent urinary tract infections, we discussed management of recurrent UTIs including prophylaxis with a daily low dose antibiotic, transvaginal estrogen therapy, D-mannose, and cranberry supplements.   - negative cystoscopy 03/17/24   - continue low dose vaginal estrogen and probiotics - discussed overlapping symptomatology with bladder pain. 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  We also discussed using pyridium  and similar over-the-counter medications for pain relief as needed. We discussed the pentad of medications including Tums, an antihistamine such as Vistaril , amitriptyline , and L-arginine.  We also discussed in-office bladder instillations for pain flares, as well as cystoscopy with hydrodistention in the operating room, which can be both diagnostic and therapeutic. She was also given information on the IC Network at https://www.ic-network.com for bladder diet suggestions and patient forums for support. - continue hydroxyzine  QID for idiopathic generalized itching, discussed risks of anticholinergic medications - consider bladder instillation

## 2024-08-02 NOTE — Assessment & Plan Note (Signed)
-   previously catheterized for , after void - repeat at follow-up at the time of cystoscopy with PVR 30mL - possibly due to myofascial pain and constipation - continue fiber supplementation for optimization of stool consistency  - s/p pelvic floor PT  - discussed history of peripheral neuropathy, reviewed need for urodynamics and renal imaging if abnormal bladder emptying.  - Cr 1.09 on 11/27/23, repeat 0.98 in 06/29/24 - consider bladder instillation for flare of symptoms

## 2024-08-02 NOTE — Assessment & Plan Note (Signed)
-   For symptomatic vaginal atrophy options include lubrication with a water-based lubricant, personal hygiene measures and barrier protection against wetness, and estrogen replacement in the form of vaginal cream, vaginal tablets, or a time-released vaginal ring.   - continue vaginal estrogen 1g 2-3 times a week

## 2024-08-03 ENCOUNTER — Other Ambulatory Visit: Payer: Self-pay | Admitting: Family Medicine

## 2024-08-03 ENCOUNTER — Ambulatory Visit: Admitting: Neurology

## 2024-08-03 DIAGNOSIS — Z79899 Other long term (current) drug therapy: Secondary | ICD-10-CM

## 2024-08-03 DIAGNOSIS — F411 Generalized anxiety disorder: Secondary | ICD-10-CM

## 2024-08-05 ENCOUNTER — Ambulatory Visit (INDEPENDENT_AMBULATORY_CARE_PROVIDER_SITE_OTHER): Admitting: Neurology

## 2024-08-05 ENCOUNTER — Encounter: Payer: Self-pay | Admitting: Neurology

## 2024-08-05 VITALS — BP 117/72 | HR 82 | Ht 64.0 in

## 2024-08-05 DIAGNOSIS — M5416 Radiculopathy, lumbar region: Secondary | ICD-10-CM

## 2024-08-05 DIAGNOSIS — E519 Thiamine deficiency, unspecified: Secondary | ICD-10-CM

## 2024-08-05 DIAGNOSIS — E538 Deficiency of other specified B group vitamins: Secondary | ICD-10-CM | POA: Diagnosis not present

## 2024-08-05 DIAGNOSIS — G629 Polyneuropathy, unspecified: Secondary | ICD-10-CM | POA: Diagnosis not present

## 2024-08-05 NOTE — Patient Instructions (Signed)
 -Continue gabapentin  600 mg/600 mg/900 mg -Continue B1 100 mg daily -Continue B12 1000 mcg daily -Continue alpha lipoic acid 600 mg twice daily  I will see you again in 1 year or sooner if needed.  The physicians and staff at [facility] are committed to providing excellent care. You may receive a survey requesting feedback about your experience at our office. We strive to receive very good responses to the survey questions. If you feel that your experience would prevent you from giving the office a very good  response, please contact our office to try to remedy the situation. We may be reached at [phone]. Thank you for taking the time out of your busy day to complete the survey.  Venetia Potters, MD [facility]  Preventing Falls at [facility] are common, often dreaded events in the lives of older people. Aside from the obvious injuries and even death that may result, fall can cause wide-ranging consequences including loss of independence, mental decline, decreased activity and mobility. Younger people are also at risk of falling, especially those with chronic illnesses and fatigue.  Ways to reduce risk for falling Examine diet and medications. Warm foods and alcohol dilate blood vessels, which can lead to dizziness when standing. Sleep aids, antidepressants and pain medications can also increase the likelihood of a fall.  Get a vision exam. Poor vision, cataracts and glaucoma increase the chances of falling.  Check foot gear. Shoes should fit snugly and have a sturdy, nonskid sole and a broad, low heel  Participate in a physician-approved exercise program to build and maintain muscle strength and improve balance and coordination. Programs that use ankle weights or stretch bands are excellent for muscle-strengthening. Water aerobics programs and low-impact Tai Chi programs have also been shown to improve balance and coordination.  Increase vitamin D  intake. Vitamin D   improves muscle strength and increases the amount of calcium the body is able to absorb and deposit in bones.  How to prevent falls from common hazards Floors - Remove all loose wires, cords, and throw rugs. Minimize clutter. Make sure rugs are anchored and smooth. Keep furniture in its usual place.  Chairs -- Use chairs with straight backs, armrests and firm seats. Add firm cushions to existing pieces to add height.  Bathroom - Install grab bars and non-skid tape in the tub or shower. Use a bathtub transfer bench or a shower chair with a back support Use an elevated toilet seat and/or safety rails to assist standing from a low surface. Do not use towel racks or bathroom tissue holders to help you stand.  Lighting - Make sure halls, stairways, and entrances are well-lit. Install a night light in your bathroom or hallway. Make sure there is a light switch at the top and bottom of the staircase. Turn lights on if you get up in the middle of the night. Make sure lamps or light switches are within reach of the bed if you have to get up during the night.  Kitchen - Install non-skid rubber mats near the sink and stove. Clean spills immediately. Store frequently used utensils, pots, pans between waist and eye level. This helps prevent reaching and bending. Sit when getting things out of lower cupboards.  Living room/ Bedrooms - Place furniture with wide spaces in between, giving enough room to move around. Establish a route through the living room that gives you something to hold onto as you walk.  Stairs - Make sure treads, rails, and rugs are secure.  Install a rail on both sides of the stairs. If stairs are a threat, it might be helpful to arrange most of your activities on the lower level to reduce the number of times you must climb the stairs.  Entrances and doorways - Install metal handles on the walls adjacent to the doorknobs of all doors to make it more secure as you travel through the  doorway.  Tips for maintaining balance Keep at least one hand free at all times. Try using a backpack or fanny pack to hold things rather than carrying them in your hands. Never carry objects in both hands when walking as this interferes with keeping your balance.  Attempt to swing both arms from front to back while walking. This might require a conscious effort if Parkinson's disease has diminished your movement. It will, however, help you to maintain balance and posture, and reduce fatigue.  Consciously lift your feet off of the ground when walking. Shuffling and dragging of the feet is a common culprit in losing your balance.  When trying to navigate turns, use a U technique of facing forward and making a wide turn, rather than pivoting sharply.  Try to stand with your feet shoulder-length apart. When your feet are close together for any length of time, you increase your risk of losing your balance and falling.  Do one thing at a time. Don't try to walk and accomplish another task, such as reading or looking around. The decrease in your automatic reflexes complicates motor function, so the less distraction, the better.  Do not wear rubber or gripping soled shoes, they might catch on the floor and cause tripping.  Move slowly when changing positions. Use deliberate, concentrated movements and, if needed, use a grab bar or walking aid. Count 15 seconds between each movement. For example, when rising from a seated position, wait 15 seconds after standing to begin walking.  If balance is a continuous problem, you might want to consider a walking aid such as a cane, walking stick, or walker. Once you've mastered walking with help, you might be ready to try it on your own again.

## 2024-08-08 ENCOUNTER — Ambulatory Visit

## 2024-08-08 DIAGNOSIS — S52572A Other intraarticular fracture of lower end of left radius, initial encounter for closed fracture: Secondary | ICD-10-CM | POA: Diagnosis not present

## 2024-08-09 ENCOUNTER — Telehealth: Payer: Self-pay | Admitting: Family Medicine

## 2024-08-09 ENCOUNTER — Other Ambulatory Visit: Payer: Self-pay

## 2024-08-09 ENCOUNTER — Telehealth: Payer: Self-pay

## 2024-08-09 DIAGNOSIS — E6609 Other obesity due to excess calories: Secondary | ICD-10-CM

## 2024-08-09 MED ORDER — ZEPBOUND 2.5 MG/0.5ML ~~LOC~~ SOAJ: 2.5000 mg | SUBCUTANEOUS | 1 refills | Status: DC

## 2024-08-09 NOTE — Telephone Encounter (Unsigned)
 Copied from CRM [id number]. Topic: Clinical - Medication Refill >> Aug 09, 2024  3:05 PM [name] wrote: Medication: tirzepatide (ZEPBOUND) 2.5 MG/0.5ML Pen  Has the patient contacted their pharmacy? Yes (Agent: If no, request that the patient contact the pharmacy for the refill. If patient does not wish to contact the pharmacy document the reason why and proceed with request.) (Agent: If yes, when and what did the pharmacy advise?)  This is the patient's preferred pharmacy:  [facility] - [place], [place] - [address] [place] [address] Phone: [phone] Fax: [phone]  Is this the correct pharmacy for this prescription? Yes If no, delete pharmacy and type the correct one.   Has the prescription been filled recently? Yes  Is the patient out of the medication? Yes  Has the patient been seen for an appointment in the last year OR does the patient have an upcoming appointment? Yes  Can we respond through MyChart? Yes  Agent: Please be advised that Rx refills may take up to 3 business days. We ask that you follow-up with your pharmacy.

## 2024-08-09 NOTE — Telephone Encounter (Signed)
 Copied from CRM [id number]. Topic: Clinical - Medical Advice >> Aug 09, 2024  3:09 PM [name] wrote: Reason for CRM: PATIENT IS REQUESTING A CALL BACK FROM THE OFFICE. SHE STATING THAT SHE NEEDS HER MEDICATION  tirzepatide (ZEPBOUND) 2.5 MG/0.5ML Pen BY THURSDAY >> Aug 09, 2024  4:11 PM [name] wrote: Patient is not wanting to increase she started that she has to stay on 2.5 for 4 months before increasing. She stated that she need medication today so it can get to here by Thursday which is her injection day and pharmacy closes at 6:00pm.

## 2024-08-09 NOTE — Telephone Encounter (Signed)
 LVM for patient to clarify she does not wish to increase medication doseage. Will send refill of 2.5 dose

## 2024-08-09 NOTE — Telephone Encounter (Signed)
 Copied from CRM [id number]. Topic: Clinical - Medical Advice >> Aug 09, 2024  3:09 PM Katrina Benitez wrote: Reason for CRM: PATIENT IS REQUESTING A CALL BACK FROM THE OFFICE. SHE STATING THAT SHE NEEDS HER MEDICATION  tirzepatide (ZEPBOUND) 2.5 MG/0.5ML Pen BY THURSDAY

## 2024-08-09 NOTE — Telephone Encounter (Signed)
Medication already sent in

## 2024-08-10 ENCOUNTER — Telehealth: Payer: Self-pay

## 2024-08-10 NOTE — Telephone Encounter (Unsigned)
 Copied from CRM [id number]. Topic: Clinical - Prescription Issue >> Aug 10, 2024  1:06 PM Viola F wrote: Reason for CRM: Patient called to follow up on refill for the Zepbound - the 2.5MG  was sent to her pharmacy and she needs it to be 5MG . She said she would like this done asap so she can pick it up tomorrow.

## 2024-08-10 NOTE — Telephone Encounter (Signed)
 Copied from CRM [id number]. Topic: Clinical - Medication Question >> Aug 10, 2024  1:10 PM Viola F wrote: Reason for CRM: Patient requested a call from Corean Ku regarding her Zepbound medication. She tried to schedule appt but she is unable to come in and Medicare is not covering virtual visits. Please call her at [phone] (H)

## 2024-08-11 ENCOUNTER — Other Ambulatory Visit: Payer: Self-pay

## 2024-08-11 ENCOUNTER — Telehealth: Payer: Self-pay

## 2024-08-11 ENCOUNTER — Ambulatory Visit: Payer: Self-pay | Admitting: Internal Medicine

## 2024-08-11 ENCOUNTER — Ambulatory Visit: Attending: Internal Medicine

## 2024-08-11 ENCOUNTER — Other Ambulatory Visit (HOSPITAL_COMMUNITY): Payer: Self-pay

## 2024-08-11 DIAGNOSIS — I480 Paroxysmal atrial fibrillation: Secondary | ICD-10-CM

## 2024-08-11 DIAGNOSIS — E6609 Other obesity due to excess calories: Secondary | ICD-10-CM

## 2024-08-11 DIAGNOSIS — E222 Syndrome of inappropriate secretion of antidiuretic hormone: Secondary | ICD-10-CM

## 2024-08-11 LAB — CUP PACEART REMOTE DEVICE CHECK
Date Time Interrogation Session: 20251119232027
Implantable Pulse Generator Implant Date: 20230614

## 2024-08-11 MED ORDER — ZEPBOUND 5 MG/0.5ML ~~LOC~~ SOAJ: 5.0000 mg | SUBCUTANEOUS | 0 refills | Status: DC

## 2024-08-11 NOTE — Telephone Encounter (Signed)
 Spoke with patient, she is frustrated because she was under the impression she was speaking to someone directly in the clinic every time she called. Explained to her that she is not, she speaks with an E2C2 agent. She feels as her concerns/request were not addressed in a timely manner, I let her know Corean is out all week and I myself have not been in the office everyday this week either. Patient says she was told we were both in the office, she also mentions she cannot send us  a MyChart message because you have never sent me a MyChart message to respond to. Explained to patient she can still send a MyChart message, by creating a new message. Walked her through how to send a new MyChart message, also made her aware that there is documentation stating she wants to stay on 2.5mg  for 4 months. Patient states she did not say that, she has had to do her own research on Zepbound  because no one explained the medication, when she should increase, etc. I let her know since she pays for the medication OOP, she can move up in doses after 4 weeks if she is not seeing any progress. Let her know she can also repeat the same dose if she wish, some patients stay on the same dose until they plateau and move up accordingly. She also requested to have a BMP checked, lab order has already been placed. Patient also requested patient experience phone number, I will find it and send it to her via MyChart. Patient gave verbal understanding, apologized for being frustrated, is satisfied with our conversation.

## 2024-08-11 NOTE — Telephone Encounter (Signed)
 Addressed in separate encounter.

## 2024-08-11 NOTE — Telephone Encounter (Signed)
 Pharmacy Patient Advocate Encounter   Received notification from Onbase that prior authorization for Zepbound  2.5 is required/requested.   Insurance verification completed.   The patient is insured through [facility].   Per test claim: Per test claim, medication is not covered due to plan/benefit exclusion, PA not submitted at this time  Patient plan only covers for sleep apnea. There was no OSA in patient chart

## 2024-08-11 NOTE — Telephone Encounter (Signed)
 Called to speak with patient regarding conversion pause and if patient was symptomatic  No answer. LMTCB

## 2024-08-11 NOTE — Telephone Encounter (Signed)
 Alert received:  ILR summary report received. Battery status OK. Normal device function. No new symptom, tachy, or brady episodes. One new AF episode, Eliquis  per EPIC EMR, AF burden 0.6%. Episode 4 hrs 36 min, median rate 107 bpm.   1 long true 15 sec pause, appears to be conversion pause.    Will contact Pt to determine if any symptoms noted.

## 2024-08-12 NOTE — Telephone Encounter (Signed)
 I called and spoke with the patient.  Advised her of a post termination pause on her ILR that was noted on 07/28/24 at 11:26 am. Inquired if the patient could recall feeling any symptoms of dizziness/ lightheadedness/ pre-syncope or if she passed out.   Per the patient, she does not recall any symptoms during that time. Advised she does take Zepbound  injections on Thursdays, but felt like she would not have injected herself during that time frame.   The patient does not some lightheadedness yesterday.   Katrina Benitez is aware that Dr. Waddell wants to see her in the office sooner rather than later to discuss her long pause.   The patient is aware that I will reach out to Dr. Adrian nurse and EP scheduling to find a sooner appointment for her to come in. Advised she will get a call back with the appointment date/ time.   The patient voices understanding and is agreeable.

## 2024-08-12 NOTE — Telephone Encounter (Signed)
Pt called back returning call.

## 2024-08-12 NOTE — Telephone Encounter (Signed)
 Patient is scheduled to see Dr. Waddell on Monday 08/15/24 at 10:30 am- ok per Stevie/Cassie. The patient is aware and agreeable.

## 2024-08-15 ENCOUNTER — Encounter: Payer: Self-pay | Admitting: Internal Medicine

## 2024-08-15 ENCOUNTER — Telehealth: Payer: Self-pay | Admitting: Pharmacy Technician

## 2024-08-15 ENCOUNTER — Ambulatory Visit: Attending: Internal Medicine | Admitting: Internal Medicine

## 2024-08-15 ENCOUNTER — Other Ambulatory Visit (HOSPITAL_COMMUNITY): Payer: Self-pay

## 2024-08-15 ENCOUNTER — Other Ambulatory Visit (INDEPENDENT_AMBULATORY_CARE_PROVIDER_SITE_OTHER)

## 2024-08-15 VITALS — BP 140/80 | HR 85 | Ht 64.0 in | Wt 182.0 lb

## 2024-08-15 DIAGNOSIS — E222 Syndrome of inappropriate secretion of antidiuretic hormone: Secondary | ICD-10-CM

## 2024-08-15 DIAGNOSIS — Z79899 Other long term (current) drug therapy: Secondary | ICD-10-CM

## 2024-08-15 DIAGNOSIS — I480 Paroxysmal atrial fibrillation: Secondary | ICD-10-CM | POA: Insufficient documentation

## 2024-08-15 LAB — BASIC METABOLIC PANEL WITH GFR
BUN: 12 mg/dL (ref 6–23)
CO2: 29 meq/L (ref 19–32)
Calcium: 9.1 mg/dL (ref 8.4–10.5)
Chloride: 100 meq/L (ref 96–112)
Creatinine, Ser: 0.87 mg/dL (ref 0.40–1.20)
GFR: 63.86 mL/min (ref >60.00)
Glucose, Bld: 104 mg/dL — ABNORMAL HIGH (ref 70–99)
Potassium: 4 meq/L (ref 3.5–5.1)
Sodium: 135 meq/L (ref 135–145)

## 2024-08-15 LAB — COMPREHENSIVE METABOLIC PANEL WITH GFR
AG Ratio: 1.9 (calc) (ref 1.0–2.5)
ALT: 13 U/L (ref 6–29)
AST: 19 U/L (ref 10–35)
Albumin: 4.2 g/dL (ref 3.6–5.1)
Alkaline phosphatase (APISO): 110 U/L (ref 37–153)
BUN: 12 mg/dL (ref 7–25)
CO2: 26 mmol/L (ref 20–32)
Calcium: 9.1 mg/dL (ref 8.6–10.4)
Chloride: 101 mmol/L (ref 98–110)
Creat: 0.96 mg/dL (ref 0.60–1.00)
Globulin: 2.2 g/dL (ref 1.9–3.7)
Glucose, Bld: 96 mg/dL (ref 65–99)
Potassium: 5 mmol/L (ref 3.5–5.3)
Sodium: 136 mmol/L (ref 135–146)
Total Bilirubin: 0.3 mg/dL (ref 0.2–1.2)
Total Protein: 6.4 g/dL (ref 6.1–8.1)
eGFR: 61 mL/min/1.73m2 (ref >=60)

## 2024-08-15 NOTE — Progress Notes (Signed)
 HPI Dr. Debarah returns today for followup. 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 She notes an episode of near syncope associated with abdominal pain and this appears to correlate with a 12 second pause. She has 1% afib burden. She denies chest pain or sob. She is not on any AV nodal blocking drugs.  Allergies  Allergen Reactions   Flagyl [Metronidazole] Hives   Hydromorphone Hives   Iodixanol Other (See Comments) and Swelling    Throat swelling  Other Reaction(s): Other (See Comments)  Throat swelling, Throat swelling, Throat swelling, Other reaction(s): Other, Other (See Comments), Throat swelling, Throat swelling, Throat swelling, Throat swelling, Throat swelling   Fentanyl  Itching   Cipro [Ciprofloxacin Hcl] Hives   Dilaudid [Hydromorphone Hcl] Hives   Ivp Dye [Iodinated Contrast Media]      Current Outpatient Medications  Medication Sig Dispense Refill   Alum Hydroxide-Mag Carbonate (GAVISCON EXTRA STRENGTH) 160-105 MG CHEW Chew 2 tablets by mouth daily as needed (upset stomach).     betamethasone  valerate (VALISONE ) 0.1 % cream APPLY TWICE DAILY (Patient taking differently: Apply topically as needed.) 45 g 0   Cholecalciferol 50 MCG (2000 UT) CAPS Take 4,000 Units by mouth daily.     Cyanocobalamin  (VITAMIN B 12 PO) Take by mouth.     diazepam  (VALIUM ) 10 MG tablet TAKE ONE TABLET EVERY TWELVE HOURS AS NEEDED FOR ANXIETY (Patient taking differently: every 6 (six) hours as needed. TAKE ONE TABLET EVERY TWELVE HOURS AS NEEDED FOR ANXIETY) 60 tablet 0   diphenoxylate -atropine  (LOMOTIL ) 2.5-0.025 MG tablet TAKE ONE (1) TABLET FOUR TIMES EACH DAY AS NEEDED FOR DIARRHEA OR LOOSE STOOLS 120 tablet 1   ELIQUIS  5 MG TABS tablet TAKE ONE TABLET BY MOUTH TWICE DAILY 60 tablet 11   estradiol  (ESTRACE ) 0.01 % CREA vaginal cream Place 0.5-1g at night 2-3 times a week 42.5 g 3   furosemide  (LASIX ) 20 MG tablet Take 1 tablet  (20 mg total) by mouth daily. (Patient taking differently: Take 20 mg by mouth as needed.) 30 tablet 3   gabapentin  (NEURONTIN ) 600 MG tablet TAKE ONE TABLET BY MOUTH THREE TIMES DAILY 270 tablet 1   hydrOXYzine  (VISTARIL ) 25 MG capsule Two capsules 4 times daily (Patient taking differently: 25 mg 4 (four) times daily. Two capsules 4 times daily) 720 capsule 1   lidocaine  (LMX) 4 % cream Use 0.5g peasize up to 3 times a day as needed for pain 30 g 0   mupirocin  ointment (BACTROBAN ) 2 % Apply 1 Application topically 2 (two) times daily. 22 g 0   omeprazole  (PRILOSEC) 40 MG capsule TAKE ONE CAPSULE EACH DAY 90 capsule 3   ondansetron  (ZOFRAN -ODT) 8 MG disintegrating tablet TAKE ONE TABLET EVERY EIGHT HOURS AS NEEDED FOR NAUSEA AND VOMITING 20 tablet 1   Polyethyl Glyc-Propyl Glyc PF (SYSTANE HYDRATION PF) 0.4-0.3 % SOLN Place 1 drop into both eyes daily as needed (dry eyes).     Pramoxine HCl (CERAVE ITCH RELIEF) 1 % CREA Apply 1 Application topically daily as needed (itching).     Probiotic Product (ALIGN PO) Take 1 capsule by mouth daily.     sodium chloride  1 g tablet Take 1 g by mouth 2 (two) times daily with a meal.     Thiamine  HCl (VITAMIN B1 PO) Take by mouth.     tirzepatide  (ZEPBOUND ) 5 MG/0.5ML Pen Inject 5 mg into the skin once a week. 2  mL 0   triamcinolone  cream (KENALOG ) 0.1 % Apply 1 Application topically as needed (itching).     zolpidem  (AMBIEN ) 10 MG tablet Take 1 tablet (10 mg total) by mouth at bedtime as needed. for sleep 30 tablet 1   methenamine  (HIPREX ) 1 g tablet Take 1 tablet (1 g total) by mouth daily. (Patient not taking: Reported on 08/15/2024) 30 tablet 2   No current facility-administered medications for this visit.     Past Medical History:  Diagnosis Date   Allergy    Anemia    Anxiety    Arthritis    degenerative disc disorder   Cancer (HCC)    skin cancer   Cardiac arrhythmia    Cataract    surgery in right eye in 03/2022   Diverticulitis     Diverticulosis    GERD (gastroesophageal reflux disease)    Heart murmur    HTN (hypertension)    not on meds in 2 years per pt on 05/19/22 n   Infertility, female    Neuromuscular disorder (HCC)    idiopathic peripheral  neuropathy   Peripheral neuropathy    Positive PPD    SIADH (syndrome of inappropriate ADH production)     ROS:   All systems reviewed and negative except as noted in the HPI.   Past Surgical History:  Procedure Laterality Date   BREAST BIOPSY Left    BREAST EXCISIONAL BIOPSY Left    benign   CATARACT EXTRACTION Right 2023   CERVICAL BIOPSY  W/ LOOP ELECTRODE EXCISION     CERVICAL POLYPECTOMY  02/2016   CHOLECYSTECTOMY N/A 06/02/2022   Procedure: LAPAROSCOPIC CHOLECYSTECTOMY WITH INTRAOPERATIVE CHOLANGIOGRAM;  Surgeon: Eletha Boas, MD;  Location: [facility];  Service: General;  Laterality: N/A;   COLONOSCOPY     COLPOSCOPY     DILATION AND CURETTAGE OF UTERUS     HYSTEROSCOPY     LOOP RECORDER IMPLANT     LUMBAR DISC SURGERY     LUMBAR LAMINECTOMY     TONSILLECTOMY     UPPER GASTROINTESTINAL ENDOSCOPY       Family History  Problem Relation Age of Onset   Breast cancer Mother    Glaucoma Mother    Miscarriages / Stillbirths Mother    Heart attack Father    Depression Father    Early death Father    Heart disease Father    Depression Brother    Hypertension Brother    Tuberculosis Paternal Grandmother    Prostate cancer Paternal Grandfather    Colon cancer Neg Hx    Esophageal cancer Neg Hx    Rectal cancer Neg Hx    Stomach cancer Neg Hx    Uterine cancer Neg Hx    Bladder Cancer Neg Hx    Renal cancer Neg Hx      Social History   Socioeconomic History   Marital status: Married    Spouse name: John   Number of children: 0   Years of education: College   Highest education level: Not on file  Occupational History   Occupation: retired physician  Tobacco Use   Smoking status: Never   Smokeless tobacco: Never  Vaping Use   Vaping  status: Never Used  Substance and Sexual Activity   Alcohol use: Yes    Alcohol/week: 7.0 standard drinks of alcohol    Types: 7 Glasses of wine per week    Comment: 102 glsses wine daily- 1 -1.1/2 a day 01/28/24   Drug use:  No   Sexual activity: Not Currently    Partners: Male    Birth control/protection: Post-menopausal  Other Topics Concern   Not on file  Social History Narrative   Lives at home with her husband. Two story   Right-handed.   1-2 cups caffeine  per day.   Retired marine scientist   Social Drivers of Corporate Investment Banker Strain: Low Risk  (05/06/2024)   Overall Financial Resource Strain (CARDIA)    Difficulty of Paying Living Expenses: Not hard at all  Food Insecurity: No Food Insecurity (05/06/2024)   Hunger Vital Sign    Worried About Running Out of Food in the Last Year: Never true    Ran Out of Food in the Last Year: Never true  Transportation Needs: No Transportation Needs (05/06/2024)   PRAPARE - Administrator, Civil Service (Medical): No    Lack of Transportation (Non-Medical): No  Physical Activity: Insufficiently Active (05/06/2024)   Exercise Vital Sign    Days of Exercise per Week: 7 days    Minutes of Exercise per Session: 10 min  Stress: No Stress Concern Present (05/06/2024)   [employer] of Occupational Health - Occupational Stress Questionnaire    Feeling of Stress: Not at all  Social Connections: Moderately Isolated (05/06/2024)   Social Connection and Isolation Panel    Frequency of Communication with Friends and Family: Three times a week    Frequency of Social Gatherings with Friends and Family: Never    Attends Religious Services: Never    Database Administrator or Organizations: No    Attends Banker Meetings: Never    Marital Status: Married  Catering Manager Violence: Not At Risk (05/06/2024)   Humiliation, Afraid, Rape, and Kick questionnaire    Fear of Current or Ex-Partner: No    Emotionally Abused: No     Physically Abused: No    Sexually Abused: No     BP (!) 140/80   Pulse 85   Ht 5' 4 (1.626 m)   Wt 182 lb (82.6 kg)   LMP  (LMP Unknown)   SpO2 98%   BMI 31.24 kg/m   Physical Exam:  Well appearing NAD HEENT: Unremarkable Neck:  No JVD, no thyromegally Lymphatics:  No adenopathy Back:  No CVA tenderness Lungs:  Clear with no wheezes HEART:  Regular rate rhythm, no murmurs, no rubs, no clicks Abd:  soft, positive bowel sounds, no organomegally, no rebound, no guarding Ext:  2 plus pulses, no edema, no cyanosis, no clubbing Skin:  No rashes no nodules Neuro:  CN II through XII intact, motor grossly intact  DEVICE  Normal device function.  See PaceArt for details.   Assess/Plan: Transient CHB with long pauses - I have discussed the treatment options with the patient and the risks/benefits/goals/expectations of DDD PM insertion and ILR removal were discussed and the patient wishes to proceed. PAF - we will remove her ILR at the time of PPM insertion.  Zela Sobieski,MD

## 2024-08-15 NOTE — H&P (View-Only) (Signed)
 HPI Dr. Debarah returns today for followup. 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 She notes an episode of near syncope associated with abdominal pain and this appears to correlate with a 12 second pause. She has 1% afib burden. She denies chest pain or sob. She is not on any AV nodal blocking drugs.  Allergies  Allergen Reactions   Flagyl [Metronidazole] Hives   Hydromorphone Hives   Iodixanol Other (See Comments) and Swelling    Throat swelling  Other Reaction(s): Other (See Comments)  Throat swelling, Throat swelling, Throat swelling, Other reaction(s): Other, Other (See Comments), Throat swelling, Throat swelling, Throat swelling, Throat swelling, Throat swelling   Fentanyl  Itching   Cipro [Ciprofloxacin Hcl] Hives   Dilaudid [Hydromorphone Hcl] Hives   Ivp Dye [Iodinated Contrast Media]      Current Outpatient Medications  Medication Sig Dispense Refill   Alum Hydroxide-Mag Carbonate (GAVISCON EXTRA STRENGTH) 160-105 MG CHEW Chew 2 tablets by mouth daily as needed (upset stomach).     betamethasone  valerate (VALISONE ) 0.1 % cream APPLY TWICE DAILY (Patient taking differently: Apply topically as needed.) 45 g 0   Cholecalciferol 50 MCG (2000 UT) CAPS Take 4,000 Units by mouth daily.     Cyanocobalamin  (VITAMIN B 12 PO) Take by mouth.     diazepam  (VALIUM ) 10 MG tablet TAKE ONE TABLET EVERY TWELVE HOURS AS NEEDED FOR ANXIETY (Patient taking differently: every 6 (six) hours as needed. TAKE ONE TABLET EVERY TWELVE HOURS AS NEEDED FOR ANXIETY) 60 tablet 0   diphenoxylate -atropine  (LOMOTIL ) 2.5-0.025 MG tablet TAKE ONE (1) TABLET FOUR TIMES EACH DAY AS NEEDED FOR DIARRHEA OR LOOSE STOOLS 120 tablet 1   ELIQUIS  5 MG TABS tablet TAKE ONE TABLET BY MOUTH TWICE DAILY 60 tablet 11   estradiol  (ESTRACE ) 0.01 % CREA vaginal cream Place 0.5-1g at night 2-3 times a week 42.5 g 3   furosemide  (LASIX ) 20 MG tablet Take 1 tablet  (20 mg total) by mouth daily. (Patient taking differently: Take 20 mg by mouth as needed.) 30 tablet 3   gabapentin  (NEURONTIN ) 600 MG tablet TAKE ONE TABLET BY MOUTH THREE TIMES DAILY 270 tablet 1   hydrOXYzine  (VISTARIL ) 25 MG capsule Two capsules 4 times daily (Patient taking differently: 25 mg 4 (four) times daily. Two capsules 4 times daily) 720 capsule 1   lidocaine  (LMX) 4 % cream Use 0.5g peasize up to 3 times a day as needed for pain 30 g 0   mupirocin  ointment (BACTROBAN ) 2 % Apply 1 Application topically 2 (two) times daily. 22 g 0   omeprazole  (PRILOSEC) 40 MG capsule TAKE ONE CAPSULE EACH DAY 90 capsule 3   ondansetron  (ZOFRAN -ODT) 8 MG disintegrating tablet TAKE ONE TABLET EVERY EIGHT HOURS AS NEEDED FOR NAUSEA AND VOMITING 20 tablet 1   Polyethyl Glyc-Propyl Glyc PF (SYSTANE HYDRATION PF) 0.4-0.3 % SOLN Place 1 drop into both eyes daily as needed (dry eyes).     Pramoxine HCl (CERAVE ITCH RELIEF) 1 % CREA Apply 1 Application topically daily as needed (itching).     Probiotic Product (ALIGN PO) Take 1 capsule by mouth daily.     sodium chloride  1 g tablet Take 1 g by mouth 2 (two) times daily with a meal.     Thiamine  HCl (VITAMIN B1 PO) Take by mouth.     tirzepatide  (ZEPBOUND ) 5 MG/0.5ML Pen Inject 5 mg into the skin once a week. 2  mL 0   triamcinolone  cream (KENALOG ) 0.1 % Apply 1 Application topically as needed (itching).     zolpidem  (AMBIEN ) 10 MG tablet Take 1 tablet (10 mg total) by mouth at bedtime as needed. for sleep 30 tablet 1   methenamine  (HIPREX ) 1 g tablet Take 1 tablet (1 g total) by mouth daily. (Patient not taking: Reported on 08/15/2024) 30 tablet 2   No current facility-administered medications for this visit.     Past Medical History:  Diagnosis Date   Allergy    Anemia    Anxiety    Arthritis    degenerative disc disorder   Cancer (HCC)    skin cancer   Cardiac arrhythmia    Cataract    surgery in right eye in 03/2022   Diverticulitis     Diverticulosis    GERD (gastroesophageal reflux disease)    Heart murmur    HTN (hypertension)    not on meds in 2 years per pt on 05/19/22 n   Infertility, female    Neuromuscular disorder (HCC)    idiopathic peripheral  neuropathy   Peripheral neuropathy    Positive PPD    SIADH (syndrome of inappropriate ADH production)     ROS:   All systems reviewed and negative except as noted in the HPI.   Past Surgical History:  Procedure Laterality Date   BREAST BIOPSY Left    BREAST EXCISIONAL BIOPSY Left    benign   CATARACT EXTRACTION Right 2023   CERVICAL BIOPSY  W/ LOOP ELECTRODE EXCISION     CERVICAL POLYPECTOMY  02/2016   CHOLECYSTECTOMY N/A 06/02/2022   Procedure: LAPAROSCOPIC CHOLECYSTECTOMY WITH INTRAOPERATIVE CHOLANGIOGRAM;  Surgeon: Eletha Boas, MD;  Location: [facility];  Service: General;  Laterality: N/A;   COLONOSCOPY     COLPOSCOPY     DILATION AND CURETTAGE OF UTERUS     HYSTEROSCOPY     LOOP RECORDER IMPLANT     LUMBAR DISC SURGERY     LUMBAR LAMINECTOMY     TONSILLECTOMY     UPPER GASTROINTESTINAL ENDOSCOPY       Family History  Problem Relation Age of Onset   Breast cancer Mother    Glaucoma Mother    Miscarriages / Stillbirths Mother    Heart attack Father    Depression Father    Early death Father    Heart disease Father    Depression Brother    Hypertension Brother    Tuberculosis Paternal Grandmother    Prostate cancer Paternal Grandfather    Colon cancer Neg Hx    Esophageal cancer Neg Hx    Rectal cancer Neg Hx    Stomach cancer Neg Hx    Uterine cancer Neg Hx    Bladder Cancer Neg Hx    Renal cancer Neg Hx      Social History   Socioeconomic History   Marital status: Married    Spouse name: John   Number of children: 0   Years of education: College   Highest education level: Not on file  Occupational History   Occupation: retired physician  Tobacco Use   Smoking status: Never   Smokeless tobacco: Never  Vaping Use   Vaping  status: Never Used  Substance and Sexual Activity   Alcohol use: Yes    Alcohol/week: 7.0 standard drinks of alcohol    Types: 7 Glasses of wine per week    Comment: 102 glsses wine daily- 1 -1.1/2 a day 01/28/24   Drug use:  No   Sexual activity: Not Currently    Partners: Male    Birth control/protection: Post-menopausal  Other Topics Concern   Not on file  Social History Narrative   Lives at home with her husband. Two story   Right-handed.   1-2 cups caffeine  per day.   Retired marine scientist   Social Drivers of Corporate Investment Banker Strain: Low Risk  (05/06/2024)   Overall Financial Resource Strain (CARDIA)    Difficulty of Paying Living Expenses: Not hard at all  Food Insecurity: No Food Insecurity (05/06/2024)   Hunger Vital Sign    Worried About Running Out of Food in the Last Year: Never true    Ran Out of Food in the Last Year: Never true  Transportation Needs: No Transportation Needs (05/06/2024)   PRAPARE - Administrator, Civil Service (Medical): No    Lack of Transportation (Non-Medical): No  Physical Activity: Insufficiently Active (05/06/2024)   Exercise Vital Sign    Days of Exercise per Week: 7 days    Minutes of Exercise per Session: 10 min  Stress: No Stress Concern Present (05/06/2024)   [employer] of Occupational Health - Occupational Stress Questionnaire    Feeling of Stress: Not at all  Social Connections: Moderately Isolated (05/06/2024)   Social Connection and Isolation Panel    Frequency of Communication with Friends and Family: Three times a week    Frequency of Social Gatherings with Friends and Family: Never    Attends Religious Services: Never    Database Administrator or Organizations: No    Attends Banker Meetings: Never    Marital Status: Married  Catering Manager Violence: Not At Risk (05/06/2024)   Humiliation, Afraid, Rape, and Kick questionnaire    Fear of Current or Ex-Partner: No    Emotionally Abused: No     Physically Abused: No    Sexually Abused: No     BP (!) 140/80   Pulse 85   Ht 5' 4 (1.626 m)   Wt 182 lb (82.6 kg)   LMP  (LMP Unknown)   SpO2 98%   BMI 31.24 kg/m   Physical Exam:  Well appearing NAD HEENT: Unremarkable Neck:  No JVD, no thyromegally Lymphatics:  No adenopathy Back:  No CVA tenderness Lungs:  Clear with no wheezes HEART:  Regular rate rhythm, no murmurs, no rubs, no clicks Abd:  soft, positive bowel sounds, no organomegally, no rebound, no guarding Ext:  2 plus pulses, no edema, no cyanosis, no clubbing Skin:  No rashes no nodules Neuro:  CN II through XII intact, motor grossly intact  DEVICE  Normal device function.  See PaceArt for details.   Assess/Plan: Transient CHB with long pauses - I have discussed the treatment options with the patient and the risks/benefits/goals/expectations of DDD PM insertion and ILR removal were discussed and the patient wishes to proceed. PAF - we will remove her ILR at the time of PPM insertion.  Zela Sobieski,MD

## 2024-08-15 NOTE — Patient Instructions (Addendum)
 Medication Instructions:  Your physician recommends that you continue on your current medications as directed. Please refer to the Current Medication list given to you today.  *If you need a refill on your cardiac medications before your next appointment, please call your pharmacy*  Lab Work: With in 30 days of procedure  You may go to any [facility] Location for your lab work:  [facility] - [address] ([facility]) - [address] [phone] [address]  [place] - [address]   [place]  - [address]   [place] - [address] - [address] [place]  - [address] - [address] ([facility]   If you have labs (blood work) drawn today and your tests are completely normal, you will receive your results only by: [employer] (if you have MyChart)  If you have any lab test that is abnormal or we need to change your treatment, we will call you or send a MyChart message to review the results.  Testing/Procedures: Instructions to come  Follow-Up: At [facility], you and your health needs are our priority.  As part of our continuing mission to provide you with exceptional heart care, we have created designated Provider Care Teams.  These Care Teams include your primary Cardiologist (physician) and Advanced Practice Providers (APPs -  Physician Assistants and Nurse Practitioners) who all work together to provide you with the care you need, when you need it.   Your next appointment:   To be scheduled

## 2024-08-15 NOTE — Progress Notes (Signed)
 Remote Loop Recorder Transmission

## 2024-08-15 NOTE — Telephone Encounter (Signed)
 Pharmacy Patient Advocate Encounter   Received notification from CoverMyMeds that prior authorization for GABAPENTIN  300MG  is required/requested.   Insurance verification completed.   The patient is insured through [facility].   Per test claim: Refill too soon. PA is not needed at this time. Medication was filled 10.1.25. Next eligible fill date is 1.3.26.    KEY: [id number]

## 2024-08-22 ENCOUNTER — Other Ambulatory Visit: Payer: Self-pay | Admitting: Cardiovascular Disease

## 2024-08-22 DIAGNOSIS — I480 Paroxysmal atrial fibrillation: Secondary | ICD-10-CM

## 2024-08-22 NOTE — Telephone Encounter (Signed)
 Prescription refill request for Eliquis  received. Indication: PAF Last office visit: 08/15/24  KANDICE Birmingham MD Scr: 0.87 on 08/15/24  Epic Age: 79 Weight: 82.6kg  Based on above findings Eliquis  5mg  twice daily is the appropriate dose.  Refill approved.

## 2024-08-23 ENCOUNTER — Telehealth: Payer: Self-pay

## 2024-08-23 ENCOUNTER — Telehealth: Payer: Self-pay | Admitting: Internal Medicine

## 2024-08-23 NOTE — Telephone Encounter (Signed)
-----   Message from Nurse Khrystina Bonnes C sent at 08/23/2024  1:40 PM EST ----- Regarding: 12/16 PPM implant Patient's PPM implant has been moved to 12/16.   Thanks! Katrina Benitez ----- Message ----- From: Radames Corean SAILOR, RN Sent: 08/16/2024   4:57 PM EST To: April Garrison, CMA; [place] MALVA Sever, RN;# Subject: 09/23/24 GT PPM-I (loop removal)                 ,Important: list procedure date as first item in subject line, followed by procedure type (e.g., 06/03/24 PPM implant)  Precert:  MD: Waddell Type of implant: PPM Device manufacturer: Medtronic Diagnosis: Brandycardia CPT code: PPM implant, dual - 66791  + 33286-loop removal C-code(s), including quantity (if indicated):  Procedure scheduled (date/time): 1/2 - 12  Procedure:  Scrub given? Yes  Medication instructions: Standard + SI Message sent to [facility]?  Added to calendar? Yes Orders entered? No, >30 days before procedure Letter complete? No, >30 days before procedure Scheduled with cath lab? Yes Labs ordered (CBC, BMET, PT/INR if on warfarin)? No Dye allergy? No Pre-meds ordered and instructions given? No, >30 days before procedure Letter method:  Special instructions: Eliquis  hold for 2 days per GT #NEEDS LOOP REMOVED# H&P: 08/15/24  Follow-up:  Cassie/Angel, please schedule Routine.  Covering RN:  Please send this message to [employer], EP scheduler, EP Scheduling pool, and EP [employer].

## 2024-08-23 NOTE — Telephone Encounter (Signed)
 Spoke with the patient who states that she would like to have her procedure done sooner than 1/2. She has been rescheduled for 12/16 with Dr. Waddell.

## 2024-08-23 NOTE — Telephone Encounter (Signed)
 Pt called in asking to speak with nurse about procedure 09/23/24. She asked if someone else can do procedure sooner.

## 2024-08-31 ENCOUNTER — Telehealth: Payer: Self-pay

## 2024-08-31 NOTE — Telephone Encounter (Signed)
 Call received from Pt.  Pt had questions about current visits scheduled.  Pt had a yearly visit and monthly loop checks that needed to be cancelled.  Cancelled all appointments not needed.  Pt aware of current necessary follow ups.

## 2024-09-01 ENCOUNTER — Other Ambulatory Visit: Payer: Self-pay | Admitting: Family Medicine

## 2024-09-01 DIAGNOSIS — E66811 Obesity, class 1: Secondary | ICD-10-CM

## 2024-09-02 ENCOUNTER — Encounter (HOSPITAL_COMMUNITY): Payer: Self-pay

## 2024-09-02 ENCOUNTER — Other Ambulatory Visit: Payer: Self-pay

## 2024-09-02 DIAGNOSIS — E66811 Obesity, class 1: Secondary | ICD-10-CM

## 2024-09-02 MED ORDER — ZEPBOUND 7.5 MG/0.5ML ~~LOC~~ SOAJ: 7.5000 mg | SUBCUTANEOUS | 0 refills | Status: DC

## 2024-09-05 NOTE — Pre-Procedure Instructions (Signed)
 Attempted to call patient regarding procedure instructions.  Left voicemail on the following items: Arrival time 0930 Nothing to eat or drink after midnight No meds AM of procedure Responsible person to drive you home and stay with you for 24 hrs Wash with special soap night before and morning of procedure If on anti-coagulant drug instructions Eliquis - last dose 12/13

## 2024-09-06 ENCOUNTER — Ambulatory Visit (HOSPITAL_COMMUNITY)
Admission: RE | Admit: 2024-09-06 | Discharge: 2024-09-06 | Disposition: A | Discharge status: Home / Self Care | Attending: Internal Medicine | Admitting: Internal Medicine

## 2024-09-06 ENCOUNTER — Ambulatory Visit (HOSPITAL_COMMUNITY)

## 2024-09-06 ENCOUNTER — Other Ambulatory Visit: Payer: Self-pay

## 2024-09-06 ENCOUNTER — Ambulatory Visit (HOSPITAL_COMMUNITY): Admission: RE | Disposition: A | Payer: Self-pay | Attending: Internal Medicine

## 2024-09-06 DIAGNOSIS — I442 Atrioventricular block, complete: Secondary | ICD-10-CM | POA: Insufficient documentation

## 2024-09-06 DIAGNOSIS — R001 Bradycardia, unspecified: Secondary | ICD-10-CM | POA: Diagnosis not present

## 2024-09-06 DIAGNOSIS — Z79899 Other long term (current) drug therapy: Secondary | ICD-10-CM | POA: Insufficient documentation

## 2024-09-06 DIAGNOSIS — I480 Paroxysmal atrial fibrillation: Secondary | ICD-10-CM

## 2024-09-06 DIAGNOSIS — Z7901 Long term (current) use of anticoagulants: Secondary | ICD-10-CM | POA: Diagnosis not present

## 2024-09-06 HISTORY — PX: PACEMAKER IMPLANT: EP1218

## 2024-09-06 HISTORY — PX: LOOP RECORDER REMOVAL: EP1215

## 2024-09-06 SURGERY — PACEMAKER IMPLANT

## 2024-09-06 MED ORDER — HYDROCORTISONE 1 % EX CREA
TOPICAL_CREAM | Freq: Once | CUTANEOUS | Status: AC
  Filled 2024-09-06 (×2): qty 28

## 2024-09-06 MED ORDER — MIDAZOLAM HCL 5 MG/5ML IJ SOLN
INTRAMUSCULAR | Status: DC | PRN
  Administered 2024-09-06 (×2): 1 mg via INTRAVENOUS
  Administered 2024-09-06 (×2): 2 mg via INTRAVENOUS
  Administered 2024-09-06: 10:00:00 1 mg via INTRAVENOUS

## 2024-09-06 MED ORDER — MIDAZOLAM HCL 2 MG/2ML IJ SOLN
INTRAMUSCULAR | Status: AC
  Filled 2024-09-06: qty 2

## 2024-09-06 MED ORDER — SODIUM CHLORIDE 0.9 % IV SOLN
INTRAVENOUS | Status: AC
  Administered 2024-09-06: 11:00:00 80 mg
  Filled 2024-09-06: qty 2

## 2024-09-06 MED ORDER — ACETAMINOPHEN 325 MG PO TABS: 325.0000 mg | ORAL_TABLET | ORAL | Status: DC | PRN

## 2024-09-06 MED ORDER — SODIUM CHLORIDE 0.9 % IV SOLN: INTRAVENOUS | Status: DC

## 2024-09-06 MED ORDER — SODIUM CHLORIDE 0.9 % IV SOLN: 80.0000 mg | INTRAVENOUS | Status: AC

## 2024-09-06 MED ORDER — HEPARIN (PORCINE) IN NACL 1000-0.9 UT/500ML-% IV SOLN
INTRAVENOUS | Status: DC | PRN
  Administered 2024-09-06: 10:00:00 500 mL

## 2024-09-06 MED ORDER — ONDANSETRON HCL 4 MG/2ML IJ SOLN: 4.0000 mg | Freq: Four times a day (QID) | INTRAMUSCULAR | Status: DC | PRN

## 2024-09-06 MED ORDER — CEFAZOLIN SODIUM-DEXTROSE 2-4 GM/100ML-% IV SOLN
INTRAVENOUS | Status: AC
  Administered 2024-09-06: 10:00:00 2 g via INTRAVENOUS
  Filled 2024-09-06: qty 100

## 2024-09-06 MED ORDER — DIPHENHYDRAMINE HCL 50 MG/ML IJ SOLN
25.0000 mg | Freq: Once | INTRAMUSCULAR | Status: AC
  Administered 2024-09-06: 10:00:00 25 mg via INTRAVENOUS
  Filled 2024-09-06: qty 1

## 2024-09-06 MED ORDER — POVIDONE-IODINE 10 % EX SWAB
2.0000 | Freq: Once | CUTANEOUS | Status: AC
  Administered 2024-09-06: 10:00:00 2 via TOPICAL

## 2024-09-06 MED ORDER — FENTANYL CITRATE (PF) 100 MCG/2ML IJ SOLN
INTRAMUSCULAR | Status: AC
  Filled 2024-09-06: qty 2

## 2024-09-06 MED ORDER — LIDOCAINE HCL (PF) 1 % IJ SOLN
INTRAMUSCULAR | Status: DC | PRN
  Administered 2024-09-06: 10:00:00 30 mL

## 2024-09-06 MED ORDER — LIDOCAINE HCL (PF) 1 % IJ SOLN
INTRAMUSCULAR | Status: AC
  Filled 2024-09-06: qty 60

## 2024-09-06 MED ORDER — FENTANYL CITRATE (PF) 100 MCG/2ML IJ SOLN
INTRAMUSCULAR | Status: DC | PRN
  Administered 2024-09-06: 11:00:00 25 ug via INTRAVENOUS

## 2024-09-06 MED ORDER — CEFAZOLIN SODIUM-DEXTROSE 1-4 GM/50ML-% IV SOLN
1.0000 g | Freq: Once | INTRAVENOUS | Status: AC
  Administered 2024-09-06: 17:00:00 1 g via INTRAVENOUS
  Filled 2024-09-06: qty 50

## 2024-09-06 MED ORDER — CHLORHEXIDINE GLUCONATE 4 % EX SOLN
4.0000 | Freq: Once | CUTANEOUS | Status: DC
  Filled 2024-09-06: qty 60

## 2024-09-06 MED ORDER — CEFAZOLIN SODIUM-DEXTROSE 2-4 GM/100ML-% IV SOLN: 2.0000 g | INTRAVENOUS | Status: AC

## 2024-09-06 MED ORDER — METHYLPREDNISOLONE SODIUM SUCC 125 MG IJ SOLR
125.0000 mg | Freq: Once | INTRAMUSCULAR | Status: AC
  Administered 2024-09-06: 10:00:00 125 mg via INTRAVENOUS
  Filled 2024-09-06: qty 2

## 2024-09-06 SURGICAL SUPPLY — 11 items
CABLE SURGICAL S-101-97-12 (CABLE) ×1 IMPLANT
CATH RIGHTSITE C315HIS02 (CATHETERS) IMPLANT
IPG PACE AZUR XT DR MRI W1DR01 (Pacemaker) IMPLANT
LEAD CAPSURE NOVUS 5076-52CM (Lead) IMPLANT
LEAD SELECT SECURE 3830 383069 (Lead) IMPLANT
PACK LOOP INSERTION (CUSTOM PROCEDURE TRAY) ×1 IMPLANT
PAD DEFIB RADIO PHYSIO CONN (PAD) ×1 IMPLANT
SHEATH 7FR PRELUDE SNAP 13 (SHEATH) IMPLANT
SLITTER 6232ADJ (MISCELLANEOUS) IMPLANT
TRAY PACEMAKER INSERTION (PACKS) ×1 IMPLANT
WIRE HI TORQ VERSACORE-J 145CM (WIRE) IMPLANT

## 2024-09-06 NOTE — Discharge Instructions (Signed)
 After Your Pacemaker   You have a Medtronic Pacemaker  If you have a Medtronic or Biotronik device, plug in your home monitor once you get home, and no manual interaction is required.   If you have an [employer] or [employer] device, plug your home monitor once you get home, sit near the device, and press the large activation button. Sit nearby until the process is complete, usually notated by lights on the monitor.   If you were set up for monitoring using an app on your phone, make sure the app remains open in the background and the Bluetooth remains on.  ACTIVITY Do not lift your arm above shoulder height for 1 week after your procedure. After 7 days, you may progress as below.  You should remove your sling 24 hours after your procedure, unless otherwise instructed by your provider.     Tuesday September 13, 2024  Wednesday September 14, 2024 Thursday September 15, 2024 Friday September 16, 2024   Do not lift, push, pull, or carry anything over 10 pounds with the affected arm until 6 weeks (Tuesday October 18, 2024 ) after your procedure.   You may drive AFTER your wound check, unless you have been told otherwise by your provider.   Ask your healthcare provider when you can go back to work   INCISION/Dressing If you are on a blood thinner such as Coumadin, Xarelto, Eliquis , Plavix , or Pradaxa please confirm with your provider when this should be resumed. Resume Eliquis  on 09/12/24  If large square, outer bandage is left in place, this can be removed after 24 hours from your procedure. Do not remove steri-strips or glue as below.   If a PRESSURE DRESSING (a bulky dressing that usually goes up over your shoulder) was applied or left in place, please follow instructions given by your provider on when to return to have this removed.   Monitor your Pacemaker site for redness, swelling, and drainage. Call the device clinic at [phone] if you experience these symptoms or  fever/chills.  If your incision is sealed with Steri-strips or staples, you may shower 7 days after your procedure or when told by your provider. Do not remove the steri-strips or let the shower hit directly on your site. You may wash around your site with soap and water.    If you were discharged in a sling, please do not wear this during the day more than 48 hours after your surgery unless otherwise instructed. This may increase the risk of stiffness and soreness in your shoulder.   Avoid lotions, ointments, or perfumes over your incision until it is well-healed.  You may use a hot tub or a pool AFTER your wound check appointment if the incision is completely closed.  Pacemaker Alerts:  Some alerts are vibratory and others beep. These are NOT emergencies. Please call our office to let us  know. If this occurs at night or on weekends, it can wait until the next business day. Send a remote transmission.  If your device is capable of reading fluid status (for heart failure), you will be offered monthly monitoring to review this with you.   DEVICE MANAGEMENT Remote monitoring is used to monitor your pacemaker from home. This monitoring is scheduled every 91 days by our office. It allows us  to keep an eye on the functioning of your device to ensure it is working properly. You will routinely see your Electrophysiologist annually (more often if necessary).  This will appear as a REMOTE  check on your MyChart schedule. These are automatic and there is nothing for you to manually do unless otherwise instructed.  You should receive your ID card for your new device in 4-8 weeks. Keep this card with you at all times once received. Consider wearing a medical alert bracelet or necklace.  Your Pacemaker may be MRI compatible. This will be discussed at your next office visit/wound check.  You should avoid contact with strong electric or magnetic fields.   Do not use amateur (ham) radio equipment or electric  (arc) welding torches. MP3 player headphones with magnets should not be used. Some devices are safe to use if held at least 12 inches (30 cm) from your Pacemaker. These include power tools, lawn mowers, and speakers. If you are unsure if something is safe to use, ask your health care provider.  When using your cell phone, hold it to the ear that is on the opposite side from the Pacemaker. Do not leave your cell phone in a pocket over the Pacemaker.  You may safely use electric blankets, heating pads, computers, and microwave ovens.  Call the office right away if: You have chest pain. You feel more short of breath than you have felt before. You feel more light-headed than you have felt before. Your incision starts to open up.  This information is not intended to replace advice given to you by your health care provider. Make sure you discuss any questions you have with your health care provider.

## 2024-09-06 NOTE — Progress Notes (Signed)
 Per Dr Waddell, went to remove tegaderm and client and husband asked to leave tegaderm on and left on

## 2024-09-06 NOTE — Progress Notes (Signed)
 Client c/o neck rash and Dr Waddell notified and order noted

## 2024-09-06 NOTE — Interval H&P Note (Signed)
 History and Physical Interval Note:  09/06/2024 9:14 AM  Katrina Benitez  has presented today for surgery, with the diagnosis of bradicardia.  The various methods of treatment have been discussed with the patient and family. After consideration of risks, benefits and other options for treatment, the patient has consented to  Procedures: PACEMAKER IMPLANT (N/A) LOOP RECORDER REMOVAL (N/A) as a surgical intervention.  The patient's history has been reviewed, patient examined, no change in status, stable for surgery.  I have reviewed the patient's chart and labs.  Questions were answered to the patient's satisfaction.     Danelle Birmingham

## 2024-09-06 NOTE — Progress Notes (Signed)
 Patient did not want to stay till her xray resulted. Patient was very unhappy about the time it took, and left AMA. MD notified.

## 2024-09-07 ENCOUNTER — Encounter (HOSPITAL_COMMUNITY): Payer: Self-pay | Admitting: Internal Medicine

## 2024-09-07 MED FILL — Midazolam HCl Inj 2 MG/2ML (Base Equivalent): INTRAMUSCULAR | Qty: 7 | Status: AC

## 2024-09-08 ENCOUNTER — Ambulatory Visit

## 2024-09-08 NOTE — Telephone Encounter (Signed)
 Left message for patient to call back

## 2024-09-08 NOTE — Telephone Encounter (Signed)
 PT is calling back and is requesting to speak with nurse again

## 2024-09-08 NOTE — Telephone Encounter (Signed)
 Spoke with patient in regards to her incision/dressing site care. Informed patient that this RN would send to her MyChart for her records. Pt stated she left before receiving any instructions. Verbalizes understanding of plan and instructed to let us  know if she had any other questions.

## 2024-09-08 NOTE — Telephone Encounter (Signed)
 PT Returning call to nurse

## 2024-09-08 NOTE — Telephone Encounter (Signed)
 Spoke with the patient about her procedure yesterday at the hospital. She states that she is very disappointed in her experience. She was expecting to receive further sedation. She understands that they gave her the maximum that they could. She was not pleased to be able to hear everything that was going on and said that she was very uncomfortable. She also states that she felt like she was left alone after her procedure in her recovery area and it was dark. She states that it took too long to get results back from her x-ray so she decided to leave. She states that today she is feeling good and does not have any concerns about her wound site or arm restrictions. Advised patient that per discharge instructions she can resume Eliquis  on 12/22. She is requesting a copy of these instructions which I have sent her through MyChart. Patient is also aware of her wound check appointment on 12/30. She will call back with any further questions or concerns.

## 2024-09-11 ENCOUNTER — Ambulatory Visit

## 2024-09-13 ENCOUNTER — Other Ambulatory Visit: Payer: Self-pay | Admitting: Family Medicine

## 2024-09-13 DIAGNOSIS — Z79899 Other long term (current) drug therapy: Secondary | ICD-10-CM

## 2024-09-13 DIAGNOSIS — F411 Generalized anxiety disorder: Secondary | ICD-10-CM

## 2024-09-19 ENCOUNTER — Ambulatory Visit: Admitting: Internal Medicine

## 2024-09-20 ENCOUNTER — Ambulatory Visit: Attending: Cardiology

## 2024-09-20 DIAGNOSIS — R001 Bradycardia, unspecified: Secondary | ICD-10-CM

## 2024-09-20 NOTE — Patient Instructions (Signed)

## 2024-09-21 ENCOUNTER — Ambulatory Visit: Payer: Self-pay | Admitting: Cardiovascular Disease

## 2024-09-21 LAB — CUP PACEART INCLINIC DEVICE CHECK
Date Time Interrogation Session: 20251230072618
Implantable Lead Connection Status: 753985
Implantable Lead Connection Status: 753985
Implantable Lead Implant Date: 20251216
Implantable Lead Implant Date: 20251216
Implantable Lead Location: 753859
Implantable Lead Location: 753860
Implantable Lead Model: 3830
Implantable Lead Model: 5076
Implantable Pulse Generator Implant Date: 20251216

## 2024-09-21 NOTE — Progress Notes (Signed)
 Normal dual chamber pacemaker wound check. Presenting rhythm: AS/VS 85. Wound well healed. Routine testing performed. Thresholds, sensing, and impedance consistent with implant measurements and at 3.5V safety margin/auto capture until 3 month visit. Brief AT episode- 6 seconds. Reviewed arm restrictions to continue for 6 weeks total post op.  Pt enrolled in remote follow-up.

## 2024-09-30 ENCOUNTER — Ambulatory Visit

## 2024-09-30 ENCOUNTER — Ambulatory Visit: Payer: Self-pay | Admitting: Obstetrics

## 2024-09-30 ENCOUNTER — Other Ambulatory Visit: Payer: Self-pay | Admitting: Family Medicine

## 2024-09-30 VITALS — BP 108/71 | HR 73

## 2024-09-30 DIAGNOSIS — R300 Dysuria: Secondary | ICD-10-CM

## 2024-09-30 DIAGNOSIS — E66811 Obesity, class 1: Secondary | ICD-10-CM

## 2024-09-30 DIAGNOSIS — R350 Frequency of micturition: Secondary | ICD-10-CM | POA: Diagnosis not present

## 2024-09-30 LAB — POCT URINALYSIS DIP (CLINITEK)
Bilirubin, UA: NEGATIVE
Blood, UA: NEGATIVE
Glucose, UA: NEGATIVE mg/dL
Ketones, POC UA: NEGATIVE mg/dL
Leukocytes, UA: NEGATIVE
Nitrite, UA: NEGATIVE
POC PROTEIN,UA: NEGATIVE
Spec Grav, UA: 1.015
Urobilinogen, UA: 0.2 U/dL
pH, UA: 5.5

## 2024-09-30 MED ORDER — NITROFURANTOIN MONOHYD MACRO 100 MG PO CAPS: 100.0000 mg | ORAL_CAPSULE | Freq: Two times a day (BID) | ORAL | 0 refills | Status: AC

## 2024-09-30 NOTE — Progress Notes (Signed)
 Katrina Benitez arrived today with dysuria and lower abdominal pain. Patient is notexperiencing fever, unstable vitals and/or one-sided back flank pain. Patient has not had had a recent hospitalization due to UTI.  Last visit in the office was 08/02/2024.  Per protocol:   The most recent Urinalysis completed on 04/21/2024 and was normal.  Last Creatinine level  Lab Results  Component Value Date   CREATININE 0.96 08/15/2024   CREATININE 0.87 08/15/2024    An urine specimen was collected and POCT urinalysis completed. [] A cath specimen was collected due to patient's current condition, symptoms or post-procedural state.  Total urine output by catheter is  Output by Drain (mL) 09/28/24 0701 - 09/28/24 1900 09/28/24 1901 - 09/29/24 0700 09/29/24 0701 - 09/29/24 1900 09/29/24 1901 - 09/30/24 0700 09/30/24 0701 - 09/30/24 1412  Patient has no LDAs of requested type attached.    [name]    POCT Urine results is normal.  Urine micro was not sent per protocol for abnormal urinalysis.  Urine culture was not sent per protocol for abnormal urinalysis.     [] Pt was notified of positive urine results and plan for additional urine testing. We will contact you within the next 3-4 days with these results.  [] No Prescription was sent to your pharmacy.  The additional testing will indicate if a prescription is needed.   [] Patient was notified of abnormal urine results. The following prescription is sent to your preferred pharmacy.  []  Macrobid  100mg  #10 1 tablet by mouth twice daily with food for 5 days      []  Bactrim  DS 800-160mg  #6 1 tablet by mouth twice daily for 3 days        []  Due to your current medication allergies, an alternate prescription was discussed with your provider and will be prescribed and sent to your pharmacy.  [] You can take over the counter AZO two tablets up to three times a day for two days.  Take AZO tablets with a full glass of water. AZO will turn your urine orange, this is normal.   [x] The  patient was notified of negative urine results.  If symptoms persist, you may take over the counter AZO two tablets up to three times a day for two days.  AZO will turn your urine orange, this is normal.  Contact the office back to schedule an appointment if your symptoms persist or worsen or you develop additional symptoms.       CC'd note to patient's provider.

## 2024-09-30 NOTE — Patient Instructions (Signed)
 Your Urine dip that was done in office was Negative. Thank for entrusting us  with your care. If you have any questions or concerns please feel free to call us  at [phone]

## 2024-10-03 ENCOUNTER — Telehealth: Payer: Self-pay

## 2024-10-03 ENCOUNTER — Encounter: Payer: Self-pay | Admitting: *Deleted

## 2024-10-03 NOTE — Telephone Encounter (Signed)
 Alert remote transmission:  AT/AF Daily Burden > Threshold AF in progress from 1/10, not always good rate control, on OAC.   On 1/10 patient's presenting was ongoing AF with elevated v-rates when in AF.   LM for patient to assess symptoms as tends to go fast when in AF and get an update transmission to see if she continues to be out of rhythm.  ? AF clinic referral if sx.

## 2024-10-04 ENCOUNTER — Other Ambulatory Visit: Payer: Self-pay

## 2024-10-04 DIAGNOSIS — E6609 Other obesity due to excess calories: Secondary | ICD-10-CM

## 2024-10-04 MED ORDER — ZEPBOUND 10 MG/0.5ML ~~LOC~~ SOAJ: 10.0000 mg | SUBCUTANEOUS | 0 refills | Status: DC

## 2024-10-04 NOTE — Telephone Encounter (Signed)
 Spoke w/ patient regarding recent device alert for AF w/ elevated V-rates. Hx AF on Eliquis . States medication compliance. Patient notes she does not usually notice when she is in AF. States a couple days ago she did feel as if her heart was racing for s a short period of time. Otherwise, currently feels fine.   Assisted patient in sending manual transmission over the phone. Presenting rhythm NSR. Current AT/AF burden 4.1%. Patient request to keep F/U visit in March w/ Prentice Passey, PA and not schedule AF clinic visit at this time. Will schedule 1 week remote transmission to reassess AF burden.   Informed patient to contact device clinic if she notices any symptoms such as fast heart rate, palpitations, SOB/fatigue, etc. Advised patient need for AF clinic visit if symptoms arise. Verbalized understanding.   Will continue to monitor and update accordingly.

## 2024-10-04 NOTE — Telephone Encounter (Signed)
 Patient called stating she is on her last day of medication and she is still symptomatic she is inquiry what can she do next. Please advise.

## 2024-10-06 ENCOUNTER — Other Ambulatory Visit: Payer: Self-pay | Admitting: Family Medicine

## 2024-10-06 ENCOUNTER — Other Ambulatory Visit

## 2024-10-06 DIAGNOSIS — E222 Syndrome of inappropriate secretion of antidiuretic hormone: Secondary | ICD-10-CM | POA: Diagnosis not present

## 2024-10-06 LAB — BASIC METABOLIC PANEL WITH GFR
BUN: 25 mg/dL — ABNORMAL HIGH (ref 6–23)
CO2: 29 meq/L (ref 19–32)
Calcium: 9.2 mg/dL (ref 8.4–10.5)
Chloride: 96 meq/L (ref 96–112)
Creatinine, Ser: 1.23 mg/dL — ABNORMAL HIGH (ref 0.40–1.20)
GFR: 42.11 mL/min — ABNORMAL LOW
Glucose, Bld: 98 mg/dL (ref 70–99)
Potassium: 4.6 meq/L (ref 3.5–5.1)
Sodium: 134 meq/L — ABNORMAL LOW (ref 135–145)

## 2024-10-07 ENCOUNTER — Other Ambulatory Visit: Payer: Self-pay

## 2024-10-07 ENCOUNTER — Other Ambulatory Visit: Payer: Self-pay | Admitting: Family Medicine

## 2024-10-09 ENCOUNTER — Ambulatory Visit

## 2024-10-10 ENCOUNTER — Ambulatory Visit

## 2024-10-10 ENCOUNTER — Encounter: Payer: Self-pay | Admitting: Neurology

## 2024-10-10 ENCOUNTER — Ambulatory Visit: Payer: Self-pay | Admitting: Family Medicine

## 2024-10-10 ENCOUNTER — Other Ambulatory Visit: Payer: Self-pay | Admitting: Neurology

## 2024-10-10 DIAGNOSIS — G629 Polyneuropathy, unspecified: Secondary | ICD-10-CM

## 2024-10-10 MED ORDER — GABAPENTIN 600 MG PO TABS: 600.0000 mg | ORAL_TABLET | Freq: Three times a day (TID) | ORAL | 3 refills | Status: AC

## 2024-10-10 MED ORDER — GABAPENTIN 300 MG PO CAPS: ORAL_CAPSULE | ORAL | 3 refills | Status: AC

## 2024-10-11 ENCOUNTER — Ambulatory Visit: Admitting: Family Medicine

## 2024-10-11 ENCOUNTER — Ambulatory Visit

## 2024-10-12 ENCOUNTER — Other Ambulatory Visit: Payer: Self-pay

## 2024-10-12 ENCOUNTER — Ambulatory Visit

## 2024-10-12 DIAGNOSIS — R300 Dysuria: Secondary | ICD-10-CM

## 2024-10-12 DIAGNOSIS — Z87440 Personal history of urinary (tract) infections: Secondary | ICD-10-CM

## 2024-10-12 NOTE — Progress Notes (Signed)
 Patient called stating she will be using the [facility] at home today.

## 2024-10-17 ENCOUNTER — Ambulatory Visit: Admitting: Family Medicine

## 2024-10-21 ENCOUNTER — Ambulatory Visit: Payer: Self-pay | Admitting: Obstetrics

## 2024-10-21 ENCOUNTER — Ambulatory Visit: Admitting: Family Medicine

## 2024-10-21 DIAGNOSIS — R300 Dysuria: Secondary | ICD-10-CM

## 2024-10-21 MED ORDER — NITROFURANTOIN MONOHYD MACRO 100 MG PO CAPS: 100.0000 mg | ORAL_CAPSULE | Freq: Two times a day (BID) | ORAL | 0 refills | Status: AC

## 2024-10-21 NOTE — Telephone Encounter (Signed)
 Left detailed message on cellphone VM per DPR that prescription was sent in and that a message was sent to her Mychart regarding results. Sotero CMA

## 2024-10-25 ENCOUNTER — Ambulatory Visit: Admitting: Family Medicine

## 2024-10-25 ENCOUNTER — Encounter: Payer: Self-pay | Admitting: Family Medicine

## 2024-10-25 VITALS — BP 128/72 | HR 78 | Temp 98.2°F | Ht 65.0 in | Wt 180.0 lb

## 2024-10-25 DIAGNOSIS — E222 Syndrome of inappropriate secretion of antidiuretic hormone: Secondary | ICD-10-CM

## 2024-10-25 DIAGNOSIS — Z79899 Other long term (current) drug therapy: Secondary | ICD-10-CM

## 2024-10-25 DIAGNOSIS — M25469 Effusion, unspecified knee: Secondary | ICD-10-CM

## 2024-10-25 DIAGNOSIS — E782 Mixed hyperlipidemia: Secondary | ICD-10-CM

## 2024-10-25 DIAGNOSIS — E663 Overweight: Secondary | ICD-10-CM

## 2024-10-25 DIAGNOSIS — N390 Urinary tract infection, site not specified: Secondary | ICD-10-CM

## 2024-10-25 DIAGNOSIS — E559 Vitamin D deficiency, unspecified: Secondary | ICD-10-CM

## 2024-10-25 MED ORDER — ZEPBOUND 12.5 MG/0.5ML ~~LOC~~ SOLN: 12.5000 mg | SUBCUTANEOUS | 1 refills | Status: AC

## 2024-10-25 NOTE — Patient Instructions (Signed)
 I have sent in Zepbound  12.5 mg to [facility]. Someone should be reaching to get you scheduled for delivery of your medication.   Follow-up with me in 6 mos for medication management, sooner if needed.

## 2024-10-26 ENCOUNTER — Other Ambulatory Visit: Payer: Self-pay | Admitting: Family Medicine

## 2024-10-26 DIAGNOSIS — Z79899 Other long term (current) drug therapy: Secondary | ICD-10-CM

## 2024-10-26 DIAGNOSIS — F411 Generalized anxiety disorder: Secondary | ICD-10-CM

## 2024-11-01 ENCOUNTER — Ambulatory Visit: Admitting: Obstetrics

## 2024-11-09 ENCOUNTER — Ambulatory Visit

## 2024-11-10 ENCOUNTER — Ambulatory Visit

## 2024-12-07 ENCOUNTER — Ambulatory Visit: Admitting: Student

## 2024-12-10 ENCOUNTER — Ambulatory Visit

## 2024-12-12 ENCOUNTER — Ambulatory Visit

## 2025-01-12 ENCOUNTER — Ambulatory Visit

## 2025-02-08 ENCOUNTER — Ambulatory Visit

## 2025-08-09 ENCOUNTER — Ambulatory Visit: Admitting: Neurology
